# Patient Record
Sex: Male | Born: 1955
Health system: Southern US, Community
[De-identification: ages and names within clinical notes are randomized; demographics above are authoritative.]

## PROBLEM LIST (undated history)

## (undated) DIAGNOSIS — E785 Hyperlipidemia, unspecified: Secondary | ICD-10-CM

## (undated) DIAGNOSIS — C801 Malignant (primary) neoplasm, unspecified: Secondary | ICD-10-CM

## (undated) DIAGNOSIS — H269 Unspecified cataract: Secondary | ICD-10-CM

## (undated) DIAGNOSIS — I10 Essential (primary) hypertension: Secondary | ICD-10-CM

## (undated) HISTORY — DX: Unspecified cataract: H26.9

## (undated) HISTORY — DX: Essential (primary) hypertension: I10

## (undated) HISTORY — DX: Hyperlipidemia, unspecified: E78.5

## (undated) HISTORY — DX: Malignant (primary) neoplasm, unspecified: C80.1

## (undated) HISTORY — PX: APPENDECTOMY: SHX54

---

## 2008-01-05 LAB — HM COLONOSCOPY

## 2012-01-19 ENCOUNTER — Encounter: Payer: Self-pay | Admitting: Family Medicine

## 2012-03-02 ENCOUNTER — Encounter: Payer: Self-pay | Admitting: Family Medicine

## 2012-03-02 ENCOUNTER — Ambulatory Visit (INDEPENDENT_AMBULATORY_CARE_PROVIDER_SITE_OTHER): Payer: Federal, State, Local not specified - PPO | Admitting: Family Medicine

## 2012-03-02 VITALS — BP 182/80 | HR 92 | Temp 98.4°F | Ht 65.5 in | Wt 180.4 lb

## 2012-03-02 DIAGNOSIS — C61 Malignant neoplasm of prostate: Secondary | ICD-10-CM

## 2012-03-02 DIAGNOSIS — I10 Essential (primary) hypertension: Secondary | ICD-10-CM | POA: Insufficient documentation

## 2012-03-02 DIAGNOSIS — E785 Hyperlipidemia, unspecified: Secondary | ICD-10-CM

## 2012-03-02 LAB — BASIC METABOLIC PANEL
BUN: 16 mg/dL (ref 6–23)
CO2: 26 mEq/L (ref 19–32)
Calcium: 9.3 mg/dL (ref 8.4–10.5)
Glucose, Bld: 98 mg/dL (ref 70–99)
Potassium: 4.1 mEq/L (ref 3.5–5.1)
Sodium: 136 mEq/L (ref 135–145)

## 2012-03-02 LAB — CBC WITH DIFFERENTIAL/PLATELET
Basophils Relative: 0.6 % (ref 0.0–3.0)
Eosinophils Relative: 0.9 % (ref 0.0–5.0)
HCT: 39.7 % (ref 39.0–52.0)
Lymphs Abs: 1.1 10*3/uL (ref 0.7–4.0)
MCV: 93.5 fl (ref 78.0–100.0)
Monocytes Relative: 7.8 % (ref 3.0–12.0)
Platelets: 330 10*3/uL (ref 150.0–400.0)
RBC: 4.24 Mil/uL (ref 4.22–5.81)
WBC: 4.9 10*3/uL (ref 4.5–10.5)

## 2012-03-02 LAB — HEPATIC FUNCTION PANEL
ALT: 23 U/L (ref 0–53)
Albumin: 4.2 g/dL (ref 3.5–5.2)
Alkaline Phosphatase: 63 U/L (ref 39–117)
Bilirubin, Direct: 0 mg/dL (ref 0.0–0.3)
Total Protein: 8.3 g/dL (ref 6.0–8.3)

## 2012-03-02 LAB — LDL CHOLESTEROL, DIRECT: Direct LDL: 165.2 mg/dL

## 2012-03-02 MED ORDER — NEBIVOLOL HCL 10 MG PO TABS: 10.0000 mg | ORAL_TABLET | Freq: Every day | ORAL | Status: DC

## 2012-03-02 NOTE — Assessment & Plan Note (Signed)
New to provider.  Pt not currently on meds but was on a statin previously.  No side effects, just stopped taking.  Check labs and restart meds prn.

## 2012-03-02 NOTE — Assessment & Plan Note (Signed)
New to provider.  Following w/ urology.  S/p radiation.  Now on hormonal injxns.

## 2012-03-02 NOTE — Progress Notes (Signed)
  Subjective:    Patient ID: [name], male    DOB: November 06, 1955, 57 y.o.   MRN: [id number]  HPI New to establish.  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  Running 6 miles daily, no CP, SOB, HAs, visual changes.  On Lisinopril 40 and Verapamil 360 daily.  Did not tolerate HCTZ due to sexual dysfxn  Prostate Cancer- dx'd 2 yrs ago, s/p radiation tx.  Following w/ urology (Dr Evelene Croon).  Currently receiving hormone injxns and is on Flomax and Cialis.  Hyperlipidemia- pt reports he has a hx of this but not currently on meds.  Pt recalls total cholesterol ~250.  Was previously on Atorvastatin and Pravastatin.   Review of Systems For ROS see HPI     Objective:   Physical Exam  Vitals reviewed. Constitutional: He is oriented to person, place, and time. He appears well-developed and well-nourished. No distress.  HENT:  Head: Normocephalic and atraumatic.  Eyes: Conjunctivae and EOM are normal. Pupils are equal, round, and reactive to light.  Neck: Normal range of motion. Neck supple. No thyromegaly present.  Cardiovascular: Normal rate, regular rhythm, normal heart sounds and intact distal pulses.   No murmur heard. Pulmonary/Chest: Effort normal and breath sounds normal. No respiratory distress.  Abdominal: Soft. Bowel sounds are normal. He exhibits no distension.  Musculoskeletal: He exhibits no edema.  Lymphadenopathy:    He has no cervical adenopathy.  Neurological: He is alert and oriented to person, place, and time. No cranial nerve deficit.  Skin: Skin is warm and dry.  Psychiatric: He has a normal mood and affect. His behavior is normal.          Assessment & Plan:

## 2012-03-02 NOTE — Patient Instructions (Addendum)
Follow up in 3-4 weeks to recheck BP Start the Bystolic- once daily Continue the Lisinopril and Verapamil We'll notify you of your lab results and make any changes if needed Call with any questions or concerns Welcome!  We're glad to have you!!!

## 2012-03-02 NOTE — Assessment & Plan Note (Signed)
New to provider, ongoing for pt.  Not well controlled.  Asymptomatic.  Add Bystolic 10mg  and follow closely.

## 2012-03-30 ENCOUNTER — Ambulatory Visit (INDEPENDENT_AMBULATORY_CARE_PROVIDER_SITE_OTHER): Payer: Federal, State, Local not specified - PPO | Admitting: Family Medicine

## 2012-03-30 ENCOUNTER — Encounter: Payer: Self-pay | Admitting: Family Medicine

## 2012-03-30 VITALS — BP 168/90 | HR 71 | Temp 98.0°F | Ht 65.5 in | Wt 179.4 lb

## 2012-03-30 DIAGNOSIS — E785 Hyperlipidemia, unspecified: Secondary | ICD-10-CM

## 2012-03-30 DIAGNOSIS — I10 Essential (primary) hypertension: Secondary | ICD-10-CM

## 2012-03-30 MED ORDER — LISINOPRIL 40 MG PO TABS: 40.0000 mg | ORAL_TABLET | Freq: Every day | ORAL | Status: DC

## 2012-03-30 MED ORDER — ATORVASTATIN CALCIUM 10 MG PO TABS: 10.0000 mg | ORAL_TABLET | Freq: Every day | ORAL | Status: DC

## 2012-03-30 MED ORDER — VERAPAMIL HCL ER 360 MG PO CP24: 360.0000 mg | ORAL_CAPSULE | Freq: Every day | ORAL | Status: DC

## 2012-03-30 NOTE — Progress Notes (Signed)
  Subjective:    Patient ID: [name], male    DOB: 02-18-1955, 57 y.o.   MRN: [id number]  HPI HTN- chronic problem, on Verapamil, Lisinopril, Bystolic daily.  Home BPs range 112-147/60-87.  Has hx of white coat HTN.  No CP, SOB, HAs, visual changes, edema.  Hyperlipidemia- chronic problem, has been on Pravastatin before w/ difficulty but did lipitor w/out problem.  Pt not willing to take 20mg - will compromise and take 10mg .   Review of Systems For ROS see HPI     Objective:   Physical Exam  Vitals reviewed. Constitutional: He is oriented to person, place, and time. He appears well-developed and well-nourished. No distress.  HENT:  Head: Normocephalic and atraumatic.  Eyes: Conjunctivae and EOM are normal. Pupils are equal, round, and reactive to light.  Neck: Normal range of motion. Neck supple. No thyromegaly present.  Cardiovascular: Normal rate, regular rhythm, normal heart sounds and intact distal pulses.   No murmur heard. Pulmonary/Chest: Effort normal and breath sounds normal. No respiratory distress.  Abdominal: Soft. Bowel sounds are normal. He exhibits no distension.  Musculoskeletal: He exhibits no edema.  Lymphadenopathy:    He has no cervical adenopathy.  Neurological: He is alert and oriented to person, place, and time. No cranial nerve deficit.  Skin: Skin is warm and dry.  Psychiatric: He has a normal mood and affect. His behavior is normal.          Assessment & Plan:

## 2012-03-30 NOTE — Patient Instructions (Addendum)
Follow up in 3 months to recheck pressure- bring your cuff Start the Lipitor nightly for cholesterol Continue the BP meds Call with any questions or concerns Happy [place]!!!

## 2012-03-30 NOTE — Assessment & Plan Note (Signed)
Chronic problem.  Pt willing to start lipitor 10mg .  Script sent.  Will repeat LFTs at next OV.  Reviewed supportive care and red flags that should prompt return.  Pt expressed understanding and is in agreement w/ plan.

## 2012-03-30 NOTE — Assessment & Plan Note (Signed)
BP not well controlled today but pt has hx of white coat HTN.  Home readings are much better.  Asymptomatic.  Encouraged him to bring cuff to next visit to correlate.  Will continue to follow closely.

## 2012-04-03 ENCOUNTER — Telehealth: Payer: Self-pay | Admitting: Family Medicine

## 2012-04-03 DIAGNOSIS — I10 Essential (primary) hypertension: Secondary | ICD-10-CM

## 2012-04-03 NOTE — Telephone Encounter (Signed)
Patient states he needs refills on bystolic. [facility] [place]

## 2012-04-03 NOTE — Telephone Encounter (Signed)
Pt called again regarding rx request. He states he is completely out and needs this today.

## 2012-04-04 MED ORDER — NEBIVOLOL HCL 10 MG PO TABS: 10.0000 mg | ORAL_TABLET | Freq: Every day | ORAL | Status: DC

## 2012-04-04 NOTE — Telephone Encounter (Signed)
Rx sent to the pharmacy by e-script.//AB/CMA 

## 2012-04-05 ENCOUNTER — Telehealth: Payer: Self-pay | Admitting: *Deleted

## 2012-04-05 ENCOUNTER — Other Ambulatory Visit: Payer: Self-pay | Admitting: *Deleted

## 2012-04-05 NOTE — Telephone Encounter (Signed)
[facility] [place] and gave verbal refill authorization for bystolic.

## 2012-04-05 NOTE — Telephone Encounter (Signed)
[facility] [place], it did not go through via escibe

## 2012-06-30 ENCOUNTER — Encounter: Payer: Self-pay | Admitting: Family Medicine

## 2012-06-30 ENCOUNTER — Ambulatory Visit (INDEPENDENT_AMBULATORY_CARE_PROVIDER_SITE_OTHER): Payer: Federal, State, Local not specified - PPO | Admitting: Family Medicine

## 2012-06-30 VITALS — BP 150/90 | HR 59 | Temp 98.2°F | Ht 65.0 in | Wt 181.8 lb

## 2012-06-30 DIAGNOSIS — I10 Essential (primary) hypertension: Secondary | ICD-10-CM

## 2012-06-30 MED ORDER — TADALAFIL 5 MG PO TABS: ORAL_TABLET | ORAL | Status: DC

## 2012-06-30 MED ORDER — NEBIVOLOL HCL 5 MG PO TABS: 5.0000 mg | ORAL_TABLET | Freq: Every day | ORAL | Status: DC

## 2012-06-30 NOTE — Progress Notes (Signed)
  Subjective:    Patient ID: [name], male    DOB: 03-23-1955, 57 y.o.   MRN: [id number]  HPI HTN- chronic problem, on Lisinopril, Bystolic, verapamil.  Pt reports when he started Bystolic he had severe neck pain, fatigue, SOB.  Having low pulse on home BP cuff.  BP readings range 110-130/50-60s.  Denies CP, HAs, visual changes, edema.   Review of Systems     Objective:   Physical Exam  Vitals reviewed. Constitutional: He is oriented to person, place, and time. He appears well-developed and well-nourished. No distress.  HENT:  Head: Normocephalic and atraumatic.  Eyes: Conjunctivae and EOM are normal. Pupils are equal, round, and reactive to light.  Neck: Normal range of motion. Neck supple. No thyromegaly present.  Cardiovascular: Normal rate, regular rhythm, normal heart sounds and intact distal pulses.   No murmur heard. Pulmonary/Chest: Effort normal and breath sounds normal. No respiratory distress.  Abdominal: Soft. Bowel sounds are normal. He exhibits no distension.  Musculoskeletal: He exhibits no edema.  Lymphadenopathy:    He has no cervical adenopathy.  Neurological: He is alert and oriented to person, place, and time. No cranial nerve deficit.  Skin: Skin is warm and dry.  Psychiatric: He has a normal mood and affect. His behavior is normal.          Assessment & Plan:

## 2012-06-30 NOTE — Patient Instructions (Addendum)
Schedule your complete physical in 6 months Decrease the Bystolic to 5 mg daily Call with any questions or concerns Have a great summer!

## 2012-06-30 NOTE — Assessment & Plan Note (Signed)
Pt's BP excellently controlled at home and other MD offices.  Unclear why BP remains high here.  His cuff is actually higher than our reading so home BPs are likely accurate if not elevated slightly.  Due to fatigue, SOB, and low HR- will decrease Bystolic to 5 mg daily.  Reviewed supportive care and red flags that should prompt return.  Pt expressed understanding and is in agreement w/ plan.

## 2012-08-21 ENCOUNTER — Ambulatory Visit: Payer: Self-pay | Admitting: Urology

## 2012-11-21 ENCOUNTER — Telehealth: Payer: Self-pay

## 2012-11-21 NOTE — Telephone Encounter (Addendum)
Left message for call back Non identifiable Medication List and allergies: reviewed and updated  90 day supply/mail order: na [facility] [address] [place]  Immunizations due: declines flu vaccine  A/P:   No changes to Health Hx,FH or PSH CCS--3-4 years ago in [place] PSA--[place] [facility]-Dr Wolff--07/2012--<0.1 Dexa--08/2012  To Discuss with Provider: Looking forward to seeing you

## 2012-11-22 ENCOUNTER — Encounter: Payer: Self-pay | Admitting: General Practice

## 2012-11-22 ENCOUNTER — Encounter: Payer: Self-pay | Admitting: Family Medicine

## 2012-11-22 ENCOUNTER — Ambulatory Visit (INDEPENDENT_AMBULATORY_CARE_PROVIDER_SITE_OTHER): Payer: Federal, State, Local not specified - PPO | Admitting: Family Medicine

## 2012-11-22 ENCOUNTER — Other Ambulatory Visit: Payer: Self-pay | Admitting: General Practice

## 2012-11-22 VITALS — BP 140/86 | HR 80 | Temp 98.1°F | Resp 16 | Ht 66.5 in | Wt 174.4 lb

## 2012-11-22 DIAGNOSIS — Z0000 Encounter for general adult medical examination without abnormal findings: Secondary | ICD-10-CM

## 2012-11-22 LAB — BASIC METABOLIC PANEL
CO2: 26 mEq/L (ref 19–32)
Calcium: 9.4 mg/dL (ref 8.4–10.5)
Chloride: 102 mEq/L (ref 96–112)
Creatinine, Ser: 0.9 mg/dL (ref 0.4–1.5)
Glucose, Bld: 101 mg/dL — ABNORMAL HIGH (ref 70–99)
Sodium: 136 mEq/L (ref 135–145)

## 2012-11-22 LAB — CBC WITH DIFFERENTIAL/PLATELET
Eosinophils Relative: 1.5 % (ref 0.0–5.0)
HCT: 38.6 % — ABNORMAL LOW (ref 39.0–52.0)
Hemoglobin: 13.3 g/dL (ref 13.0–17.0)
Lymphocytes Relative: 24.4 % (ref 12.0–46.0)
Lymphs Abs: 1.2 10*3/uL (ref 0.7–4.0)
Monocytes Absolute: 0.4 10*3/uL (ref 0.1–1.0)
Monocytes Relative: 7.5 % (ref 3.0–12.0)
Neutro Abs: 3.2 10*3/uL (ref 1.4–7.7)
Neutrophils Relative %: 66.1 % (ref 43.0–77.0)
Platelets: 300 10*3/uL (ref 150.0–400.0)
RBC: 4.17 Mil/uL — ABNORMAL LOW (ref 4.22–5.81)
WBC: 4.9 10*3/uL (ref 4.5–10.5)

## 2012-11-22 LAB — TSH: TSH: 1.24 u[IU]/mL (ref 0.35–5.50)

## 2012-11-22 LAB — LIPID PANEL
LDL Cholesterol: 100 mg/dL — ABNORMAL HIGH (ref 0–99)
Total CHOL/HDL Ratio: 4
Triglycerides: 72 mg/dL (ref 0.0–149.0)

## 2012-11-22 LAB — HEPATIC FUNCTION PANEL
ALT: 22 U/L (ref 0–53)
Albumin: 4.4 g/dL (ref 3.5–5.2)
Alkaline Phosphatase: 56 U/L (ref 39–117)
Bilirubin, Direct: 0 mg/dL (ref 0.0–0.3)
Total Protein: 8.3 g/dL (ref 6.0–8.3)

## 2012-11-22 MED ORDER — ATORVASTATIN CALCIUM 10 MG PO TABS: 10.0000 mg | ORAL_TABLET | Freq: Every day | ORAL | Status: DC

## 2012-11-22 MED ORDER — VERAPAMIL HCL ER 360 MG PO CP24: 360.0000 mg | ORAL_CAPSULE | Freq: Every day | ORAL | Status: DC

## 2012-11-22 MED ORDER — LISINOPRIL 40 MG PO TABS: 40.0000 mg | ORAL_TABLET | Freq: Every day | ORAL | Status: DC

## 2012-11-22 MED ORDER — NEBIVOLOL HCL 5 MG PO TABS: 5.0000 mg | ORAL_TABLET | Freq: Every day | ORAL | Status: DC

## 2012-11-22 MED ORDER — TADALAFIL 5 MG PO TABS: ORAL_TABLET | ORAL | Status: DC

## 2012-11-22 NOTE — Progress Notes (Signed)
  Subjective:    Patient ID: [name], male    DOB: 1955/07/02, 57 y.o.   MRN: [id number]  HPI Pre visit review using our clinic review tool, if applicable. No additional management support is needed unless otherwise documented below in the visit note.   CPE- UTD on colonoscopy, Urology.  No concerns.   Review of Systems Patient reports no vision/hearing changes, anorexia, fever ,adenopathy, persistant/recurrent hoarseness, swallowing issues, chest pain, palpitations, edema, persistant/recurrent cough, hemoptysis, dyspnea (rest,exertional, paroxysmal nocturnal), gastrointestinal  bleeding (melena, rectal bleeding), abdominal pain, excessive heart burn, GU symptoms (dysuria, hematuria, voiding/incontinence issues) syncope, focal weakness, memory loss, numbness & tingling, skin/hair/nail changes, depression, anxiety, abnormal bruising/bleeding, musculoskeletal symptoms/signs.     Objective:   Physical Exam BP 140/86  Pulse 80  Temp(Src) 98.1 F (36.7 C) (Oral)  Resp 16  Ht 5' 6.5" (1.689 m)  Wt 174 lb 6 oz (79.096 kg)  BMI 27.73 kg/m2  SpO2 97%  General Appearance:    Alert, cooperative, no distress, appears stated age  Head:    Normocephalic, without obvious abnormality, atraumatic  Eyes:    PERRL, conjunctiva/corneas clear, EOM's intact, fundi    benign, both eyes       Ears:    Normal TM's and external ear canals, both ears  Nose:   Nares normal, septum midline, mucosa normal, no drainage   or sinus tenderness  Throat:   Lips, mucosa, and tongue normal; teeth and gums normal  Neck:   Supple, symmetrical, trachea midline, no adenopathy;       thyroid:  No enlargement/tenderness/nodules  Back:     Symmetric, no curvature, ROM normal, no CVA tenderness  Lungs:     Clear to auscultation bilaterally, respirations unlabored  Chest wall:    No tenderness or deformity  Heart:    Regular rate and rhythm, S1 and S2 normal, no murmur, rub   or gallop  Abdomen:     Soft, non-tender,  bowel sounds active all four quadrants,    no masses, no organomegaly  Genitalia:    Deferred to urology  Rectal:    Extremities:   Extremities normal, atraumatic, no cyanosis or edema  Pulses:   2+ and symmetric all extremities  Skin:   Skin color, texture, turgor normal, no rashes or lesions  Lymph nodes:   Cervical, supraclavicular, and axillary nodes normal  Neurologic:   CNII-XII intact. Normal strength, sensation and reflexes      throughout          Assessment & Plan:

## 2012-11-22 NOTE — Assessment & Plan Note (Signed)
Pt's PE WNL.  UTD on Urology- deferred GU and rectal exam to them.  UTD on colonoscopy.  Check labs.  Anticipatory guidance provided.

## 2012-11-22 NOTE — Patient Instructions (Signed)
Follow up in 6 months to recheck BP and hyperlipidemia We'll notify you of your lab results and make any changes if needed Keep up the good work!  You look great! Call with any questions or concerns Happy Holidays!

## 2013-01-02 ENCOUNTER — Encounter: Payer: Federal, State, Local not specified - PPO | Admitting: Family Medicine

## 2013-02-09 ENCOUNTER — Other Ambulatory Visit: Payer: Self-pay | Admitting: Family Medicine

## 2013-02-09 NOTE — Telephone Encounter (Signed)
Denied due to pharmacist stating that he had refills on hold.

## 2013-03-12 ENCOUNTER — Other Ambulatory Visit: Payer: Self-pay | Admitting: Family Medicine

## 2013-03-12 NOTE — Telephone Encounter (Signed)
Last OV 11-22-12 Med filled 11-22-12 #30 with 6

## 2013-05-25 ENCOUNTER — Encounter: Payer: Self-pay | Admitting: Family Medicine

## 2013-05-25 ENCOUNTER — Ambulatory Visit (INDEPENDENT_AMBULATORY_CARE_PROVIDER_SITE_OTHER): Payer: Federal, State, Local not specified - PPO | Admitting: Family Medicine

## 2013-05-25 ENCOUNTER — Encounter: Payer: Self-pay | Admitting: General Practice

## 2013-05-25 ENCOUNTER — Other Ambulatory Visit: Payer: Self-pay | Admitting: General Practice

## 2013-05-25 VITALS — BP 142/86 | HR 80 | Temp 98.2°F | Resp 16 | Wt 172.2 lb

## 2013-05-25 DIAGNOSIS — E785 Hyperlipidemia, unspecified: Secondary | ICD-10-CM

## 2013-05-25 DIAGNOSIS — I10 Essential (primary) hypertension: Secondary | ICD-10-CM

## 2013-05-25 LAB — LIPID PANEL
CHOL/HDL RATIO: 3
Cholesterol: 168 mg/dL (ref 0–200)
HDL: 54.7 mg/dL (ref >39.00)
LDL Cholesterol: 103 mg/dL — ABNORMAL HIGH (ref 0–99)
Triglycerides: 54 mg/dL (ref 0.0–149.0)
VLDL: 10.8 mg/dL (ref 0.0–40.0)

## 2013-05-25 LAB — CBC WITH DIFFERENTIAL/PLATELET
BASOS ABS: 0 10*3/uL (ref 0.0–0.1)
Basophils Relative: 0.7 % (ref 0.0–3.0)
EOS ABS: 0.1 10*3/uL (ref 0.0–0.7)
Eosinophils Relative: 1.2 % (ref 0.0–5.0)
HCT: 39.3 % (ref 39.0–52.0)
Hemoglobin: 13.4 g/dL (ref 13.0–17.0)
LYMPHS ABS: 1 10*3/uL (ref 0.7–4.0)
Lymphocytes Relative: 23.5 % (ref 12.0–46.0)
MCHC: 34 g/dL (ref 30.0–36.0)
MCV: 94.6 fl (ref 78.0–100.0)
MONO ABS: 0.3 10*3/uL (ref 0.1–1.0)
MONOS PCT: 7.3 % (ref 3.0–12.0)
NEUTROS ABS: 2.9 10*3/uL (ref 1.4–7.7)
Neutrophils Relative %: 67.3 % (ref 43.0–77.0)
PLATELETS: 281 10*3/uL (ref 150.0–400.0)
RBC: 4.15 Mil/uL — ABNORMAL LOW (ref 4.22–5.81)
RDW: 13.4 % (ref 11.5–15.5)
WBC: 4.4 10*3/uL (ref 4.0–10.5)

## 2013-05-25 LAB — HEPATIC FUNCTION PANEL
ALBUMIN: 4.3 g/dL (ref 3.5–5.2)
ALT: 38 U/L (ref 0–53)
AST: 33 U/L (ref 0–37)
Alkaline Phosphatase: 56 U/L (ref 39–117)
BILIRUBIN DIRECT: 0.1 mg/dL (ref 0.0–0.3)
Total Bilirubin: 0.9 mg/dL (ref 0.2–1.2)
Total Protein: 8.2 g/dL (ref 6.0–8.3)

## 2013-05-25 LAB — BASIC METABOLIC PANEL
BUN: 13 mg/dL (ref 6–23)
CO2: 30 mEq/L (ref 19–32)
Calcium: 9.5 mg/dL (ref 8.4–10.5)
Chloride: 101 mEq/L (ref 96–112)
Creatinine, Ser: 1 mg/dL (ref 0.4–1.5)
GFR: 80.15 mL/min (ref >60.00)
GLUCOSE: 93 mg/dL (ref 70–99)
POTASSIUM: 4.4 meq/L (ref 3.5–5.1)
Sodium: 141 mEq/L (ref 135–145)

## 2013-05-25 MED ORDER — NEBIVOLOL HCL 5 MG PO TABS: 5.0000 mg | ORAL_TABLET | Freq: Every day | ORAL | Status: DC

## 2013-05-25 MED ORDER — LISINOPRIL 40 MG PO TABS: 40.0000 mg | ORAL_TABLET | Freq: Every day | ORAL | Status: DC

## 2013-05-25 MED ORDER — ATORVASTATIN CALCIUM 10 MG PO TABS: 10.0000 mg | ORAL_TABLET | Freq: Every day | ORAL | Status: DC

## 2013-05-25 MED ORDER — VERAPAMIL HCL ER 360 MG PO CP24: 360.0000 mg | ORAL_CAPSULE | Freq: Every day | ORAL | Status: DC

## 2013-05-25 NOTE — Progress Notes (Signed)
   Subjective:    Patient ID: [name], male    DOB: May 19, 1955, 58 y.o.   MRN: [id number]  HPI HTN- chronic problem, on Lisinopril, Bystolic, Verapamil.  Denies CP, SOB, HAs, visual changes, edema.  No CP, SOB, HAs, visual changes, edema.  Hyperlipidemia- chronic problem, on Lipitor.  Denies abd pain, N/V, myalgias   Review of Systems For ROS see HPI     Objective:   Physical Exam  Vitals reviewed. Constitutional: He is oriented to person, place, and time. He appears well-developed and well-nourished. No distress.  HENT:  Head: Normocephalic and atraumatic.  Eyes: Conjunctivae and EOM are normal. Pupils are equal, round, and reactive to light.  Neck: Normal range of motion. Neck supple. No thyromegaly present.  Cardiovascular: Normal rate, regular rhythm, normal heart sounds and intact distal pulses.   No murmur heard. Pulmonary/Chest: Effort normal and breath sounds normal. No respiratory distress.  Abdominal: Soft. Bowel sounds are normal. He exhibits no distension.  Musculoskeletal: He exhibits no edema.  Lymphadenopathy:    He has no cervical adenopathy.  Neurological: He is alert and oriented to person, place, and time. No cranial nerve deficit.  Skin: Skin is warm and dry.  Psychiatric: He has a normal mood and affect. His behavior is normal.          Assessment & Plan:

## 2013-05-25 NOTE — Assessment & Plan Note (Signed)
Pt's BP is always better controlled at home than in office.  Asymptomatic.  Check labs.  No anticipated med changes.

## 2013-05-25 NOTE — Patient Instructions (Signed)
Schedule your complete physical in 6 months We'll notify you of your lab results and make any changes if needed Keep up the good work!  You look great! Call with any questions or concerns [place]!!!

## 2013-05-25 NOTE — Progress Notes (Signed)
Pre visit review using our clinic review tool, if applicable. No additional management support is needed unless otherwise documented below in the visit note. 

## 2013-05-25 NOTE — Assessment & Plan Note (Signed)
Chronic problem.  Tolerating statin w/o difficulty.  Check labs.  Adjust meds prn  

## 2013-05-29 ENCOUNTER — Telehealth: Payer: Self-pay | Admitting: Family Medicine

## 2013-05-29 NOTE — Telephone Encounter (Signed)
Relevant patient education mailed to patient.  

## 2013-08-03 ENCOUNTER — Telehealth: Payer: Self-pay | Admitting: Family Medicine

## 2013-08-03 NOTE — Telephone Encounter (Signed)
Caller name: [place] Relation to pt: Call back number:[phone] Pharmacy:  Reason for call: pt states he needs a PA for Cialis 5mg . He says he has BPH

## 2013-08-06 NOTE — Telephone Encounter (Signed)
PA was approved on 08/06/13. Valid from 06/18/13 to 08/06/2014. Letter faxed to pharmacy and approval note will be sent to office within 48-72 hours.

## 2013-09-02 ENCOUNTER — Other Ambulatory Visit: Payer: Self-pay | Admitting: Family Medicine

## 2013-09-03 NOTE — Telephone Encounter (Signed)
Med filled.  

## 2013-11-23 ENCOUNTER — Ambulatory Visit: Payer: Federal, State, Local not specified - PPO | Admitting: Family Medicine

## 2013-11-27 ENCOUNTER — Ambulatory Visit (INDEPENDENT_AMBULATORY_CARE_PROVIDER_SITE_OTHER): Payer: Federal, State, Local not specified - PPO | Admitting: Family Medicine

## 2013-11-27 ENCOUNTER — Encounter: Payer: Self-pay | Admitting: Family Medicine

## 2013-11-27 VITALS — BP 140/82 | HR 80 | Temp 98.2°F | Resp 16 | Wt 174.0 lb

## 2013-11-27 DIAGNOSIS — E785 Hyperlipidemia, unspecified: Secondary | ICD-10-CM

## 2013-11-27 DIAGNOSIS — I10 Essential (primary) hypertension: Secondary | ICD-10-CM

## 2013-11-27 LAB — HEPATIC FUNCTION PANEL
ALBUMIN: 4.6 g/dL (ref 3.5–5.2)
ALK PHOS: 54 U/L (ref 39–117)
ALT: 22 U/L (ref 0–53)
AST: 23 U/L (ref 0–37)
BILIRUBIN DIRECT: 0 mg/dL (ref 0.0–0.3)
Total Bilirubin: 1 mg/dL (ref 0.2–1.2)
Total Protein: 8.2 g/dL (ref 6.0–8.3)

## 2013-11-27 LAB — BASIC METABOLIC PANEL
BUN: 19 mg/dL (ref 6–23)
CALCIUM: 9.4 mg/dL (ref 8.4–10.5)
CHLORIDE: 101 meq/L (ref 96–112)
CO2: 25 mEq/L (ref 19–32)
Creatinine, Ser: 1 mg/dL (ref 0.4–1.5)
GFR: 83.83 mL/min (ref >60.00)
GLUCOSE: 74 mg/dL (ref 70–99)
Potassium: 4.3 mEq/L (ref 3.5–5.1)
SODIUM: 139 meq/L (ref 135–145)

## 2013-11-27 LAB — LIPID PANEL
CHOLESTEROL: 176 mg/dL (ref 0–200)
HDL: 49.7 mg/dL (ref >39.00)
LDL Cholesterol: 109 mg/dL — ABNORMAL HIGH (ref 0–99)
NONHDL: 126.3
Total CHOL/HDL Ratio: 4
Triglycerides: 87 mg/dL (ref 0.0–149.0)
VLDL: 17.4 mg/dL (ref 0.0–40.0)

## 2013-11-27 LAB — CBC WITH DIFFERENTIAL/PLATELET
BASOS ABS: 0 10*3/uL (ref 0.0–0.1)
Basophils Relative: 0.7 % (ref 0.0–3.0)
EOS ABS: 0 10*3/uL (ref 0.0–0.7)
Eosinophils Relative: 0.6 % (ref 0.0–5.0)
HEMATOCRIT: 39.9 % (ref 39.0–52.0)
Hemoglobin: 13.6 g/dL (ref 13.0–17.0)
Lymphocytes Relative: 15.9 % (ref 12.0–46.0)
Lymphs Abs: 1 10*3/uL (ref 0.7–4.0)
MCHC: 34 g/dL (ref 30.0–36.0)
MCV: 94 fl (ref 78.0–100.0)
MONOS PCT: 5.2 % (ref 3.0–12.0)
Monocytes Absolute: 0.3 10*3/uL (ref 0.1–1.0)
Neutro Abs: 5 10*3/uL (ref 1.4–7.7)
Neutrophils Relative %: 77.6 % — ABNORMAL HIGH (ref 43.0–77.0)
PLATELETS: 274 10*3/uL (ref 150.0–400.0)
RBC: 4.24 Mil/uL (ref 4.22–5.81)
RDW: 13.4 % (ref 11.5–15.5)
WBC: 6.4 10*3/uL (ref 4.0–10.5)

## 2013-11-27 NOTE — Progress Notes (Signed)
   Subjective:    Patient ID: Ansen Sayegh, male    DOB: 05-29-1955, 58 y.o.   MRN: [id number]  HPI HTN- chronic problem, adequate control.  On Lisinopril, Bystolic, Verapamil.  Denies CP, SOB, HAs, visual changes, edema.  Exercising regularly- ran 2 miles this morning.  Hyperlipidemia- chronic problem, on Lipitor.  Denies abd pain, N/V, myalgias.   Review of Systems For ROS see HPI     Objective:   Physical Exam  Constitutional: He is oriented to person, place, and time. He appears well-developed and well-nourished. No distress.  HENT:  Head: Normocephalic and atraumatic.  Eyes: Conjunctivae and EOM are normal. Pupils are equal, round, and reactive to light.  Neck: Normal range of motion. Neck supple. No thyromegaly present.  Cardiovascular: Normal rate, regular rhythm, normal heart sounds and intact distal pulses.   No murmur heard. Pulmonary/Chest: Effort normal and breath sounds normal. No respiratory distress.  Abdominal: Soft. Bowel sounds are normal. He exhibits no distension.  Musculoskeletal: He exhibits no edema.  Lymphadenopathy:    He has no cervical adenopathy.  Neurological: He is alert and oriented to person, place, and time. No cranial nerve deficit.  Skin: Skin is warm and dry.  Psychiatric: He has a normal mood and affect. His behavior is normal.  Vitals reviewed.         Assessment & Plan:

## 2013-11-27 NOTE — Progress Notes (Signed)
Pre visit review using our clinic review tool, if applicable. No additional management support is needed unless otherwise documented below in the visit note. 

## 2013-11-27 NOTE — Assessment & Plan Note (Signed)
Chronic problem.  Tolerating meds w/o difficulty.  Check labs.  Adjust meds prn  

## 2013-11-27 NOTE — Patient Instructions (Signed)
Schedule your complete physical in 6 months We'll notify you of your lab results and make any changes if needed Keep up the good work!  You look great!!! Call with any questions or concerns Happy Holidays!! 

## 2013-11-27 NOTE — Assessment & Plan Note (Signed)
Chronic problem.  Adequate control.  Asymptomatic.  Check labs.  No anticipated med changes 

## 2013-12-06 ENCOUNTER — Telehealth: Payer: Self-pay | Admitting: Family Medicine

## 2013-12-06 NOTE — Telephone Encounter (Signed)
Labs mailed

## 2013-12-06 NOTE — Telephone Encounter (Signed)
Caller name:Stamas Tayton Relation to GQ:HQIX Call back [place]:  Reason for call: pt is requesting a copy of his last lab results mailed to him.

## 2014-03-21 ENCOUNTER — Telehealth: Payer: Self-pay | Admitting: Family Medicine

## 2014-03-21 MED ORDER — TADALAFIL 5 MG PO TABS: ORAL_TABLET | ORAL | Status: DC

## 2014-03-21 MED ORDER — ATORVASTATIN CALCIUM 10 MG PO TABS: 10.0000 mg | ORAL_TABLET | Freq: Every day | ORAL | Status: DC

## 2014-03-21 NOTE — Telephone Encounter (Signed)
Caller name: Elma, Limas Relation to pt: self  Call back number: [phone] [id number] [place], [place] [phone] (Phone) [phone] (Fax)        Reason for call:  Pt requesting a refill atorvastatin (LIPITOR) 10 MG tablet and CIALIS 5 MG tablet

## 2014-03-21 NOTE — Telephone Encounter (Signed)
Med filled.  

## 2014-04-04 ENCOUNTER — Other Ambulatory Visit: Payer: Self-pay | Admitting: General Practice

## 2014-04-04 MED ORDER — TADALAFIL 5 MG PO TABS: ORAL_TABLET | ORAL | Status: DC

## 2014-04-05 ENCOUNTER — Telehealth: Payer: Self-pay | Admitting: Family Medicine

## 2014-04-05 NOTE — Telephone Encounter (Signed)
Or can We use the 11am on June 1st?

## 2014-04-05 NOTE — Telephone Encounter (Signed)
Caller name:Guerette, Jamesen Relation to DD:UKGU Call back [phone] Pharmacy:  Reason for call: pt was scheduled for a CPE on 06/04/14, due to dr. Darene Lamer being off that day pt does not want to wait until July for his CPE, ok to put two slots together?

## 2014-04-05 NOTE — Telephone Encounter (Signed)
Ok to use 11 and move him to next day

## 2014-06-03 ENCOUNTER — Encounter: Payer: Federal, State, Local not specified - PPO | Admitting: Family Medicine

## 2014-06-04 ENCOUNTER — Encounter: Payer: Federal, State, Local not specified - PPO | Admitting: Family Medicine

## 2014-06-24 ENCOUNTER — Other Ambulatory Visit: Payer: Self-pay | Admitting: General Practice

## 2014-06-24 MED ORDER — LISINOPRIL 40 MG PO TABS: 40.0000 mg | ORAL_TABLET | Freq: Every day | ORAL | Status: DC

## 2014-07-22 ENCOUNTER — Other Ambulatory Visit: Payer: Self-pay | Admitting: Family Medicine

## 2014-07-22 ENCOUNTER — Telehealth: Payer: Self-pay | Admitting: Family Medicine

## 2014-07-22 NOTE — Telephone Encounter (Signed)
Med filled.  

## 2014-07-22 NOTE — Telephone Encounter (Signed)
Med filled today

## 2014-07-22 NOTE — Telephone Encounter (Signed)
Relation to pt: self  Call back number: [phone] Pharmacy: [id number] [place], [place] [phone] (Phone) [phone] (Fax)         Reason for call:  Patient requesting a refill nebivolol (BYSTOLIC) 5 MG tablet

## 2014-08-08 ENCOUNTER — Encounter: Payer: Federal, State, Local not specified - PPO | Admitting: Family Medicine

## 2014-08-22 ENCOUNTER — Other Ambulatory Visit: Payer: Self-pay | Admitting: Family Medicine

## 2014-08-22 NOTE — Telephone Encounter (Signed)
°  Relation to LS:LHTD Call back number:[phone] [facility]  Reason for call: pt is needing new rx verapamil (VERELAN PM) 360 MG 24 hr and tadalafil (CIALIS) 5 MG tablet and states pharmacy informed him that he needs PA for the CIALIS.

## 2014-08-23 NOTE — Telephone Encounter (Signed)
Per pt the verapamil is a refill The Cialis needs PA.  Pt out of both meds Please send asap.

## 2014-08-23 NOTE — Telephone Encounter (Signed)
Verapamil was filled, Jess can you work on the Cialis PA?

## 2014-08-26 ENCOUNTER — Telehealth: Payer: Self-pay | Admitting: *Deleted

## 2014-08-26 NOTE — Telephone Encounter (Signed)
PA approved effective 06/27/2014 - 08/26/2015. Approval letter sent for scanning. JG//CMA

## 2014-08-26 NOTE — Telephone Encounter (Signed)
Prior authorization for Cialis initiated. Awaiting determination. JG//CMA

## 2014-10-14 ENCOUNTER — Other Ambulatory Visit: Payer: Self-pay | Admitting: Family Medicine

## 2014-10-14 MED ORDER — NEBIVOLOL HCL 5 MG PO TABS: ORAL_TABLET | ORAL | Status: DC

## 2014-10-14 MED ORDER — ATORVASTATIN CALCIUM 10 MG PO TABS: 10.0000 mg | ORAL_TABLET | Freq: Every day | ORAL | Status: DC

## 2014-10-14 NOTE — Telephone Encounter (Signed)
Rx request to pharmacy/[facility] Requested drug refills are authorized 30-day ONLY, however, the patient needs further evaluation and/or laboratory testing before further refills are given. Ask him to make an appointment for this.

## 2014-10-14 NOTE — Telephone Encounter (Signed)
Caller name: Randee Upchurch   Relationship to patient: Self   Can be reached: [phone]  [facility]acy[id number] [place], [place]  Reason for call: Pt is requesting a refill on his Bystolic & atorvastatin Rx.

## 2014-11-02 ENCOUNTER — Other Ambulatory Visit: Payer: Self-pay | Admitting: Family Medicine

## 2014-11-04 NOTE — Telephone Encounter (Signed)
Medication filled to pharmacy as requested.   

## 2014-11-11 ENCOUNTER — Other Ambulatory Visit: Payer: Self-pay | Admitting: Family Medicine

## 2014-11-11 NOTE — Telephone Encounter (Signed)
Medication filled to pharmacy as requested.   

## 2014-11-12 ENCOUNTER — Telehealth: Payer: Self-pay | Admitting: Family Medicine

## 2014-11-12 ENCOUNTER — Other Ambulatory Visit: Payer: Self-pay | Admitting: Family Medicine

## 2014-11-12 NOTE — Telephone Encounter (Signed)
Caller name: Self   Can be reached: [phone] ([phone]  Pharmacy: [id number] [place], [place] [phone] (Phone) [phone] (Fax)         Reason for call: need refill on CIALIS 5 MG tablet

## 2014-11-12 NOTE — Telephone Encounter (Signed)
Medication filled to pharmacy as requested.   

## 2014-11-20 ENCOUNTER — Ambulatory Visit (INDEPENDENT_AMBULATORY_CARE_PROVIDER_SITE_OTHER): Payer: Federal, State, Local not specified - PPO | Admitting: Family Medicine

## 2014-11-20 ENCOUNTER — Encounter: Payer: Self-pay | Admitting: Family Medicine

## 2014-11-20 VITALS — BP 128/74 | HR 75 | Temp 98.0°F | Resp 16 | Ht 67.0 in | Wt 173.0 lb

## 2014-11-20 DIAGNOSIS — Z23 Encounter for immunization: Secondary | ICD-10-CM | POA: Diagnosis not present

## 2014-11-20 DIAGNOSIS — Z0000 Encounter for general adult medical examination without abnormal findings: Secondary | ICD-10-CM

## 2014-11-20 LAB — TSH: TSH: 1.31 u[IU]/mL (ref 0.35–4.50)

## 2014-11-20 LAB — CBC WITH DIFFERENTIAL/PLATELET
BASOS ABS: 0 10*3/uL (ref 0.0–0.1)
Basophils Relative: 0.5 % (ref 0.0–3.0)
EOS ABS: 0 10*3/uL (ref 0.0–0.7)
Eosinophils Relative: 0.3 % (ref 0.0–5.0)
HCT: 45.2 % (ref 39.0–52.0)
Hemoglobin: 15.2 g/dL (ref 13.0–17.0)
LYMPHS ABS: 1.1 10*3/uL (ref 0.7–4.0)
LYMPHS PCT: 16.2 % (ref 12.0–46.0)
MCHC: 33.7 g/dL (ref 30.0–36.0)
MCV: 95.4 fl (ref 78.0–100.0)
Monocytes Absolute: 0.5 10*3/uL (ref 0.1–1.0)
Monocytes Relative: 7.8 % (ref 3.0–12.0)
NEUTROS ABS: 5.2 10*3/uL (ref 1.4–7.7)
NEUTROS PCT: 75.2 % (ref 43.0–77.0)
PLATELETS: 277 10*3/uL (ref 150.0–400.0)
RBC: 4.74 Mil/uL (ref 4.22–5.81)
RDW: 13.6 % (ref 11.5–15.5)
WBC: 6.9 10*3/uL (ref 4.0–10.5)

## 2014-11-20 LAB — HEPATIC FUNCTION PANEL
ALBUMIN: 4.6 g/dL (ref 3.5–5.2)
ALK PHOS: 54 U/L (ref 39–117)
ALT: 27 U/L (ref 0–53)
AST: 26 U/L (ref 0–37)
BILIRUBIN DIRECT: 0.2 mg/dL (ref 0.0–0.3)
TOTAL PROTEIN: 8.3 g/dL (ref 6.0–8.3)
Total Bilirubin: 0.9 mg/dL (ref 0.2–1.2)

## 2014-11-20 LAB — LIPID PANEL
CHOLESTEROL: 138 mg/dL (ref 0–200)
HDL: 47.3 mg/dL (ref >39.00)
LDL Cholesterol: 77 mg/dL (ref 0–99)
NonHDL: 91.16
TRIGLYCERIDES: 70 mg/dL (ref 0.0–149.0)
Total CHOL/HDL Ratio: 3
VLDL: 14 mg/dL (ref 0.0–40.0)

## 2014-11-20 LAB — BASIC METABOLIC PANEL
BUN: 11 mg/dL (ref 6–23)
CALCIUM: 9.7 mg/dL (ref 8.4–10.5)
CO2: 27 meq/L (ref 19–32)
Chloride: 103 mEq/L (ref 96–112)
Creatinine, Ser: 1.15 mg/dL (ref 0.40–1.50)
GFR: 68.65 mL/min (ref >60.00)
GLUCOSE: 111 mg/dL — AB (ref 70–99)
Potassium: 4.6 mEq/L (ref 3.5–5.1)
SODIUM: 139 meq/L (ref 135–145)

## 2014-11-20 MED ORDER — NEBIVOLOL HCL 5 MG PO TABS: ORAL_TABLET | ORAL | Status: DC

## 2014-11-20 MED ORDER — ATORVASTATIN CALCIUM 10 MG PO TABS
ORAL_TABLET | ORAL | Status: DC
Start: 2014-11-20 — End: 2015-05-19

## 2014-11-20 MED ORDER — TADALAFIL 5 MG PO TABS: ORAL_TABLET | ORAL | Status: DC

## 2014-11-20 MED ORDER — LISINOPRIL 40 MG PO TABS: 40.0000 mg | ORAL_TABLET | Freq: Every day | ORAL | Status: DC

## 2014-11-20 NOTE — Assessment & Plan Note (Signed)
Pt's PE WNL.  Declines flu.  Tdap given.  UTD on urology.  Check labs.  Anticipatory guidance provided.

## 2014-11-20 NOTE — Progress Notes (Signed)
   Subjective:    Patient ID: [name], male    DOB: 1956/01/04, 59 y.o.   MRN: [id number]  HPI CPE- UTD on urology (hx of prostate cancer).  Declines flu shot.   Review of Systems Patient reports no vision/hearing changes, anorexia, fever ,adenopathy, persistant/recurrent hoarseness, swallowing issues, chest pain, palpitations, edema, persistant/recurrent cough, hemoptysis, dyspnea (rest,exertional, paroxysmal nocturnal), gastrointestinal  bleeding (melena, rectal bleeding), abdominal pain, excessive heart burn, GU symptoms (dysuria, hematuria, voiding/incontinence issues) syncope, focal weakness, memory loss, numbness & tingling, skin/hair/nail changes, depression, anxiety, abnormal bruising/bleeding, musculoskeletal symptoms/signs.     Objective:   Physical Exam General Appearance:    Alert, cooperative, no distress, appears stated age  Head:    Normocephalic, without obvious abnormality, atraumatic  Eyes:    PERRL, conjunctiva/corneas clear, EOM's intact, fundi    benign, both eyes       Ears:    Normal TM's and external ear canals, both ears  Nose:   Nares normal, septum midline, mucosa normal, no drainage   or sinus tenderness  Throat:   Lips, mucosa, and tongue normal; teeth and gums normal  Neck:   Supple, symmetrical, trachea midline, no adenopathy;       thyroid:  No enlargement/tenderness/nodules  Back:     Symmetric, no curvature, ROM normal, no CVA tenderness  Lungs:     Clear to auscultation bilaterally, respirations unlabored  Chest wall:    No tenderness or deformity  Heart:    Regular rate and rhythm, S1 and S2 normal, no murmur, rub   or gallop  Abdomen:     Soft, non-tender, bowel sounds active all four quadrants,    no masses, no organomegaly  Genitalia:    Deferred to urology  Rectal:    Extremities:   Extremities normal, atraumatic, no cyanosis or edema  Pulses:   2+ and symmetric all extremities  Skin:   Skin color, texture, turgor normal, no rashes or  lesions  Lymph nodes:   Cervical, supraclavicular, and axillary nodes normal  Neurologic:   CNII-XII intact. Normal strength, sensation and reflexes      throughout          Assessment & Plan:

## 2014-11-20 NOTE — Patient Instructions (Signed)
Follow up as scheduled We'll notify you of your lab results and make any changes Keep up the good work on healthy and diet and regular exercise- you look great! Call with any questions or concerns If you want to join [place] at the new [place] office, any scheduled appointments will automatically transfer and we will see you at [address] [place] [address] [place], [place] [address] (OPENING 01/07/15) Happy Holidays!!!

## 2014-11-20 NOTE — Progress Notes (Signed)
Pre visit review using our clinic review tool, if applicable. No additional management support is needed unless otherwise documented below in the visit note. 

## 2014-11-20 NOTE — Addendum Note (Signed)
Addended by: Davis Gourd on: 11/20/2014 09:39 AM   Modules accepted: Orders

## 2014-11-21 ENCOUNTER — Encounter: Payer: Self-pay | Admitting: General Practice

## 2014-11-21 ENCOUNTER — Other Ambulatory Visit (INDEPENDENT_AMBULATORY_CARE_PROVIDER_SITE_OTHER): Payer: Federal, State, Local not specified - PPO

## 2014-11-21 DIAGNOSIS — R739 Hyperglycemia, unspecified: Secondary | ICD-10-CM

## 2014-11-21 LAB — HEMOGLOBIN A1C: HEMOGLOBIN A1C: 5.2 % (ref 4.6–6.5)

## 2015-02-07 ENCOUNTER — Other Ambulatory Visit: Payer: Self-pay | Admitting: Family Medicine

## 2015-02-07 NOTE — Telephone Encounter (Signed)
Medication filled to pharmacy as requested.   

## 2015-05-05 ENCOUNTER — Other Ambulatory Visit: Payer: Self-pay | Admitting: General Practice

## 2015-05-05 MED ORDER — NEBIVOLOL HCL 5 MG PO TABS: ORAL_TABLET | ORAL | Status: DC

## 2015-05-19 ENCOUNTER — Ambulatory Visit (INDEPENDENT_AMBULATORY_CARE_PROVIDER_SITE_OTHER): Payer: Federal, State, Local not specified - PPO | Admitting: Family Medicine

## 2015-05-19 ENCOUNTER — Encounter: Payer: Self-pay | Admitting: Family Medicine

## 2015-05-19 VITALS — BP 124/80 | HR 72 | Temp 98.1°F | Resp 16 | Ht 67.0 in | Wt 162.1 lb

## 2015-05-19 DIAGNOSIS — E785 Hyperlipidemia, unspecified: Secondary | ICD-10-CM

## 2015-05-19 DIAGNOSIS — I10 Essential (primary) hypertension: Secondary | ICD-10-CM | POA: Diagnosis not present

## 2015-05-19 LAB — HEPATIC FUNCTION PANEL
ALT: 20 U/L (ref 0–53)
AST: 21 U/L (ref 0–37)
Albumin: 4.5 g/dL (ref 3.5–5.2)
Alkaline Phosphatase: 48 U/L (ref 39–117)
BILIRUBIN DIRECT: 0.1 mg/dL (ref 0.0–0.3)
BILIRUBIN TOTAL: 0.7 mg/dL (ref 0.2–1.2)
TOTAL PROTEIN: 7.8 g/dL (ref 6.0–8.3)

## 2015-05-19 LAB — LIPID PANEL
CHOL/HDL RATIO: 3
Cholesterol: 146 mg/dL (ref 0–200)
HDL: 44.4 mg/dL (ref >39.00)
LDL Cholesterol: 88 mg/dL (ref 0–99)
NONHDL: 101.28
TRIGLYCERIDES: 64 mg/dL (ref 0.0–149.0)
VLDL: 12.8 mg/dL (ref 0.0–40.0)

## 2015-05-19 LAB — CBC WITH DIFFERENTIAL/PLATELET
BASOS ABS: 0 10*3/uL (ref 0.0–0.1)
Basophils Relative: 0.4 % (ref 0.0–3.0)
EOS ABS: 0 10*3/uL (ref 0.0–0.7)
EOS PCT: 0.4 % (ref 0.0–5.0)
HCT: 43.8 % (ref 39.0–52.0)
HEMOGLOBIN: 15 g/dL (ref 13.0–17.0)
LYMPHS ABS: 1 10*3/uL (ref 0.7–4.0)
Lymphocytes Relative: 19.6 % (ref 12.0–46.0)
MCHC: 34.1 g/dL (ref 30.0–36.0)
MCV: 96.1 fl (ref 78.0–100.0)
MONO ABS: 0.4 10*3/uL (ref 0.1–1.0)
Monocytes Relative: 7.3 % (ref 3.0–12.0)
NEUTROS PCT: 72.3 % (ref 43.0–77.0)
Neutro Abs: 3.8 10*3/uL (ref 1.4–7.7)
Platelets: 238 10*3/uL (ref 150.0–400.0)
RBC: 4.56 Mil/uL (ref 4.22–5.81)
RDW: 13.9 % (ref 11.5–15.5)
WBC: 5.2 10*3/uL (ref 4.0–10.5)

## 2015-05-19 LAB — BASIC METABOLIC PANEL
BUN: 21 mg/dL (ref 6–23)
CALCIUM: 9.5 mg/dL (ref 8.4–10.5)
CO2: 26 meq/L (ref 19–32)
CREATININE: 1.1 mg/dL (ref 0.40–1.50)
Chloride: 101 mEq/L (ref 96–112)
GFR: 72.15 mL/min (ref >60.00)
Glucose, Bld: 86 mg/dL (ref 70–99)
Potassium: 4.5 mEq/L (ref 3.5–5.1)
Sodium: 135 mEq/L (ref 135–145)

## 2015-05-19 MED ORDER — LISINOPRIL 40 MG PO TABS: 40.0000 mg | ORAL_TABLET | Freq: Every day | ORAL | Status: DC

## 2015-05-19 MED ORDER — ATORVASTATIN CALCIUM 10 MG PO TABS: ORAL_TABLET | ORAL | Status: DC

## 2015-05-19 NOTE — Progress Notes (Signed)
Pre visit review using our clinic review tool, if applicable. No additional management support is needed unless otherwise documented below in the visit note. 

## 2015-05-19 NOTE — Progress Notes (Signed)
   Subjective:    Patient ID: [name], male    DOB: 05/12/1955, 60 y.o.   MRN: [id number]  HPI HTN- chronic problem, on Lisinopril, Bystolic, Verapamil.  Good control.  Pt has lost 11 lbs since last visit.  No CP, SOB, HAs, visual changes, edema.  Hyperlipidemia- chronic problem, on Lipitor daily.  No abd pain, N/V, myalgias.   Review of Systems For ROS see HPI     Objective:   Physical Exam  Constitutional: He is oriented to person, place, and time. He appears well-developed and well-nourished. No distress.  HENT:  Head: Normocephalic and atraumatic.  Eyes: Conjunctivae and EOM are normal. Pupils are equal, round, and reactive to light.  Neck: Normal range of motion. Neck supple. No thyromegaly present.  Cardiovascular: Normal rate, regular rhythm, normal heart sounds and intact distal pulses.   No murmur heard. Pulmonary/Chest: Effort normal and breath sounds normal. No respiratory distress.  Abdominal: Soft. Bowel sounds are normal. He exhibits no distension.  Musculoskeletal: He exhibits no edema.  Lymphadenopathy:    He has no cervical adenopathy.  Neurological: He is alert and oriented to person, place, and time. No cranial nerve deficit.  Skin: Skin is warm and dry.  Psychiatric: He has a normal mood and affect. His behavior is normal.  Vitals reviewed.         Assessment & Plan:

## 2015-05-19 NOTE — Assessment & Plan Note (Signed)
Chronic problem.  Well controlled.  Asymptomatic.  Check labs.  No anticipated med changes. 

## 2015-05-19 NOTE — Patient Instructions (Signed)
Schedule your complete physical in 6 months We'll notify you of your lab results and make any changes if needed Keep up the good work!  You look great! Call with any questions or concerns Thanks for sticking with [place]! Have a great summer!!! [place]!!!

## 2015-05-19 NOTE — Assessment & Plan Note (Signed)
Chronic problem.  Tolerating statin w/o difficulty.  Check labs.  Applauded his weight loss efforts.  Adjust meds prn

## 2015-07-23 DIAGNOSIS — D400 Neoplasm of uncertain behavior of prostate: Secondary | ICD-10-CM | POA: Diagnosis not present

## 2015-07-23 DIAGNOSIS — C61 Malignant neoplasm of prostate: Secondary | ICD-10-CM | POA: Diagnosis not present

## 2015-07-23 DIAGNOSIS — R300 Dysuria: Secondary | ICD-10-CM | POA: Diagnosis not present

## 2015-07-29 DIAGNOSIS — R300 Dysuria: Secondary | ICD-10-CM | POA: Diagnosis not present

## 2015-08-02 ENCOUNTER — Encounter (HOSPITAL_COMMUNITY): Payer: Self-pay

## 2015-08-02 ENCOUNTER — Emergency Department (HOSPITAL_COMMUNITY)
Admission: EM | Admit: 2015-08-02 | Discharge: 2015-08-02 | Disposition: A | Discharge status: Home / Self Care | Payer: Federal, State, Local not specified - PPO | Attending: Emergency Medicine | Admitting: Emergency Medicine

## 2015-08-02 DIAGNOSIS — Z8546 Personal history of malignant neoplasm of prostate: Secondary | ICD-10-CM | POA: Diagnosis not present

## 2015-08-02 DIAGNOSIS — I10 Essential (primary) hypertension: Secondary | ICD-10-CM | POA: Diagnosis not present

## 2015-08-02 DIAGNOSIS — Z87891 Personal history of nicotine dependence: Secondary | ICD-10-CM | POA: Diagnosis not present

## 2015-08-02 DIAGNOSIS — R339 Retention of urine, unspecified: Secondary | ICD-10-CM | POA: Diagnosis not present

## 2015-08-02 LAB — URINALYSIS, ROUTINE W REFLEX MICROSCOPIC
Bilirubin Urine: NEGATIVE
GLUCOSE, UA: NEGATIVE mg/dL
Ketones, ur: NEGATIVE mg/dL
LEUKOCYTES UA: NEGATIVE
Nitrite: NEGATIVE
PH: 5.5 (ref 5.0–8.0)
PROTEIN: NEGATIVE mg/dL
Specific Gravity, Urine: 1.015 (ref 1.005–1.030)

## 2015-08-02 LAB — I-STAT CHEM 8, ED
BUN: 17 mg/dL (ref 6–20)
CALCIUM ION: 1.15 mmol/L (ref 1.12–1.23)
CHLORIDE: 100 mmol/L — AB (ref 101–111)
CREATININE: 0.9 mg/dL (ref 0.61–1.24)
GLUCOSE: 88 mg/dL (ref 65–99)
HCT: 41 % (ref 39.0–52.0)
Hemoglobin: 13.9 g/dL (ref 13.0–17.0)
POTASSIUM: 3.6 mmol/L (ref 3.5–5.1)
Sodium: 137 mmol/L (ref 135–145)
TCO2: 24 mmol/L (ref 0–100)

## 2015-08-02 LAB — URINE MICROSCOPIC-ADD ON

## 2015-08-02 MED ORDER — CEPHALEXIN 500 MG PO CAPS: 500.0000 mg | ORAL_CAPSULE | Freq: Two times a day (BID) | ORAL | 0 refills | Status: DC

## 2015-08-02 NOTE — ED Provider Notes (Signed)
Emergency Department Provider Note   I have reviewed the triage vital signs and the nursing notes.   HISTORY  Chief Complaint Urinary Retention   HPI [name] is a 60 y.o. male with PMH of prostate cancer, HLD, and HTN presents to the emergency department for evaluation of inability to urinate and lower abdominal fullness and discomfort. The patient states he's had some red tinged urine with occasional clot passage over the past 2 weeks which is very unusual for him. He was treated for prostate cancer proximal foot 5 years ago and follows with his urologist every 6 months. He denies problems with hematuria or urinary retention in the past. He was treated for presumed urinary tract infection recently but states that the cultures did not grow out bacteria. Denies any other medication changes. Reports it is no longer taking his tamsulosin at his urologist recommendation. For the past several days he's had difficulty initiating a stream will occasionally passed small clots in the toilet. Today at approximately noon he had only dribbling stream and then nothing.    Past Medical History:  Diagnosis Date  . Cancer ([place])   . Hyperlipidemia   . Hypertension     Patient Active Problem List   Diagnosis Date Noted  . Routine general medical examination at a health care facility 11/22/2012  . HTN (hypertension) 03/02/2012  . Hyperlipidemia 03/02/2012  . Prostate cancer ([place]) 03/02/2012    History reviewed. No pertinent surgical history.  Current Outpatient Rx  . Order #: [id number] Class: Normal  . Order #: [id number] Class: Print  . Order #: [id number] Class: Normal  . Order #: [id number] Class: Normal  . Order #: [id number] Class: Normal  . Order #: [id number] Class: Normal    Allergies Review of patient's allergies indicates no known allergies.  Family History  Problem Relation Age of Onset  . Hypertension Mother   . Hypertension Father   . Hypertension Sister   . Hypertension  Brother     Social History Social History  Substance Use Topics  . Smoking status: Former Smoker    Types: Cigarettes    Quit date: 09/24/2011  . Smokeless tobacco: Never Used  . Alcohol use 10.5 oz/week    21 Standard drinks or equivalent per week    Review of Systems  Constitutional: No fever/chills Eyes: No visual changes. ENT: No sore throat. Cardiovascular: Denies chest pain. Respiratory: Denies shortness of breath. Gastrointestinal: lower abdominal pain.  No nausea, no vomiting.  No diarrhea.  No constipation. Genitourinary: Negative for dysuria. Urinary retention and hematuria.  Musculoskeletal: Negative for back pain. Skin: Negative for rash. Neurological: Negative for headaches, focal weakness or numbness.  10-point ROS otherwise negative.  ____________________________________________   PHYSICAL EXAM:  VITAL SIGNS: ED Triage Vitals  Enc Vitals Group     BP 08/02/15 1604 (!) 217/109     Pulse Rate 08/02/15 1604 94     Resp 08/02/15 1604 18     Temp 08/02/15 1604 98.6 F (37 C)     Temp Source 08/02/15 1604 Oral     SpO2 08/02/15 1604 99 %     Weight 08/02/15 1604 176 lb 2 oz (79.9 kg)     Height 08/02/15 1604 5\' 5"  (1.651 m)     Pain Score 08/02/15 1602 8   Constitutional: Alert and oriented. Well appearing and in no acute distress. Eyes: Conjunctivae are normal.  Head: Atraumatic. Nose: No congestion/rhinnorhea. Mouth/Throat: Mucous membranes are moist.  Oropharynx non-erythematous. Neck: No stridor.   Cardiovascular: Normal rate,  regular rhythm. Good peripheral circulation. Grossly normal heart sounds.   Respiratory: Normal respiratory effort.  No retractions. Lungs CTAB. Gastrointestinal: Soft and nontender. No distention.  Genitourinary: Foley cath in place with pink-tinged urine in line  Musculoskeletal: No lower extremity tenderness nor edema. No gross deformities of extremities. Neurologic:  Normal speech and language. No gross focal neurologic  deficits are appreciated.  Skin:  Skin is warm, dry and intact. No rash noted. Psychiatric: Mood and affect are normal. Speech and behavior are normal.  ____________________________________________   LABS (all labs ordered are listed, but only abnormal results are displayed)  Labs Reviewed  URINALYSIS, ROUTINE W REFLEX MICROSCOPIC (NOT AT [facility]) - Abnormal; Notable for the following:       Result Value   Hgb urine dipstick LARGE (*)    All other components within normal limits  URINE MICROSCOPIC-ADD ON - Abnormal; Notable for the following:    Squamous Epithelial / LPF 0-5 (*)    Bacteria, UA RARE (*)    All other components within normal limits  I-STAT CHEM 8, ED - Abnormal; Notable for the following:    Chloride 100 (*)    All other components within normal limits    ___________________________________   PROCEDURES  Procedure(s) performed:   Procedures  None ____________________________________________   INITIAL IMPRESSION / ASSESSMENT AND PLAN / ED COURSE  Pertinent labs & imaging results that were available during my care of the patient were reviewed by me and considered in my medical decision making (see chart for details).  Patient resents to the emergency department for evaluation of urinary retention and gross hematuria. He had greater than 500 mL of urine on bladder scan and Foley catheter was placed. Patient has great relief after Foley placement. Suspect that the patient may be forming blood clots which is leading to his urinary retention. He also has a history of prostate cancer. Plan for screening labs to evaluate for kidney function and electrolyte abnormalities. We'll likely discharge home with Foley catheter in place and follow-up in the coming week with the patient's established urologist for an office void trial.   05:56 PM Patient with hematuria but no evidence of infection. Creatinine normal. Plan for discharge home with Foley catheter in place. He will  call his urologist on Monday to discuss office follow up. Will place on Keflex while foley in place. Discussed return precautions in detail.  ____________________________________________  FINAL CLINICAL IMPRESSION(S) / ED DIAGNOSES  Final diagnoses:  Urinary retention     MEDICATIONS GIVEN DURING THIS VISIT:  None  NEW OUTPATIENT MEDICATIONS STARTED DURING THIS VISIT:  New Prescriptions   CEPHALEXIN (KEFLEX) 500 MG CAPSULE    Take 1 capsule (500 mg total) by mouth 2 (two) times daily.      Note:  This document was prepared using Dragon voice recognition software and may include unintentional dictation errors.  Nanda Quinton, MD Emergency Medicine    Margette Fast, MD 08/02/15 1800

## 2015-08-02 NOTE — ED Notes (Signed)
Bladder Scanner reads 503

## 2015-08-02 NOTE — ED Triage Notes (Signed)
Patient here with urinary retention since noon. States that he is had minimal amount of urin out and experiencing bladder pain and pressure, has remote hx of prostate Ca. Recently treated for UTI

## 2015-08-02 NOTE — Discharge Instructions (Signed)
You were seen in the ED today with urinary retention. We placed a foley catheter that we are leaving in place. We are starting an antibiotic to prevent infection. Call your Urologist on Monday to schedule a follow up visit to remove the catheter and further investigate the blood in the urine.   Return to the ED with any further urinary retention, fever, chills, abdominal pain, or other concerning symptoms.

## 2015-08-04 ENCOUNTER — Other Ambulatory Visit: Payer: Self-pay | Admitting: Family Medicine

## 2015-08-04 DIAGNOSIS — C61 Malignant neoplasm of prostate: Secondary | ICD-10-CM | POA: Diagnosis not present

## 2015-08-04 DIAGNOSIS — D400 Neoplasm of uncertain behavior of prostate: Secondary | ICD-10-CM | POA: Diagnosis not present

## 2015-08-04 DIAGNOSIS — R310 Gross hematuria: Secondary | ICD-10-CM | POA: Diagnosis not present

## 2015-08-06 DIAGNOSIS — R310 Gross hematuria: Secondary | ICD-10-CM | POA: Diagnosis not present

## 2015-08-11 DIAGNOSIS — C61 Malignant neoplasm of prostate: Secondary | ICD-10-CM | POA: Diagnosis not present

## 2015-08-11 DIAGNOSIS — D400 Neoplasm of uncertain behavior of prostate: Secondary | ICD-10-CM | POA: Diagnosis not present

## 2015-08-11 DIAGNOSIS — R310 Gross hematuria: Secondary | ICD-10-CM | POA: Diagnosis not present

## 2015-08-11 DIAGNOSIS — R339 Retention of urine, unspecified: Secondary | ICD-10-CM | POA: Diagnosis not present

## 2015-08-12 DIAGNOSIS — R310 Gross hematuria: Secondary | ICD-10-CM | POA: Diagnosis not present

## 2015-08-12 DIAGNOSIS — R339 Retention of urine, unspecified: Secondary | ICD-10-CM | POA: Diagnosis not present

## 2015-08-12 DIAGNOSIS — C61 Malignant neoplasm of prostate: Secondary | ICD-10-CM | POA: Diagnosis not present

## 2015-08-12 DIAGNOSIS — D400 Neoplasm of uncertain behavior of prostate: Secondary | ICD-10-CM | POA: Diagnosis not present

## 2015-08-18 DIAGNOSIS — R310 Gross hematuria: Secondary | ICD-10-CM | POA: Diagnosis not present

## 2015-08-18 DIAGNOSIS — N3041 Irradiation cystitis with hematuria: Secondary | ICD-10-CM | POA: Diagnosis not present

## 2015-08-18 DIAGNOSIS — D400 Neoplasm of uncertain behavior of prostate: Secondary | ICD-10-CM | POA: Diagnosis not present

## 2015-08-19 ENCOUNTER — Ambulatory Visit: Payer: Federal, State, Local not specified - PPO | Admitting: Anesthesiology

## 2015-08-19 ENCOUNTER — Encounter
Admission: AD | Disposition: A | Discharge status: Home / Self Care | Payer: Self-pay | Source: Ambulatory Visit | Attending: Urology

## 2015-08-19 ENCOUNTER — Encounter: Payer: Self-pay | Admitting: Anesthesiology

## 2015-08-19 ENCOUNTER — Ambulatory Visit
Admission: AD | Admit: 2015-08-19 | Discharge: 2015-08-19 | Disposition: A | Discharge status: Home / Self Care | Payer: Federal, State, Local not specified - PPO | Source: Ambulatory Visit | Attending: Urology | Admitting: Urology

## 2015-08-19 DIAGNOSIS — N3040 Irradiation cystitis without hematuria: Secondary | ICD-10-CM

## 2015-08-19 DIAGNOSIS — N3041 Irradiation cystitis with hematuria: Secondary | ICD-10-CM | POA: Diagnosis not present

## 2015-08-19 DIAGNOSIS — Z79899 Other long term (current) drug therapy: Secondary | ICD-10-CM | POA: Diagnosis not present

## 2015-08-19 DIAGNOSIS — I10 Essential (primary) hypertension: Secondary | ICD-10-CM | POA: Diagnosis not present

## 2015-08-19 DIAGNOSIS — R338 Other retention of urine: Secondary | ICD-10-CM | POA: Diagnosis not present

## 2015-08-19 DIAGNOSIS — Z87891 Personal history of nicotine dependence: Secondary | ICD-10-CM | POA: Insufficient documentation

## 2015-08-19 DIAGNOSIS — C61 Malignant neoplasm of prostate: Secondary | ICD-10-CM | POA: Insufficient documentation

## 2015-08-19 DIAGNOSIS — R339 Retention of urine, unspecified: Secondary | ICD-10-CM | POA: Diagnosis not present

## 2015-08-19 DIAGNOSIS — R310 Gross hematuria: Secondary | ICD-10-CM | POA: Diagnosis not present

## 2015-08-19 HISTORY — PX: CYSTOSCOPY WITH FULGERATION: SHX6638

## 2015-08-19 SURGERY — CYSTOSCOPY, WITH BLADDER FULGURATION
Anesthesia: General

## 2015-08-19 MED ORDER — LIDOCAINE HCL 2 % EX GEL
CUTANEOUS | Status: AC
  Filled 2015-08-19: qty 10

## 2015-08-19 MED ORDER — BELLADONNA ALKALOIDS-OPIUM 16.2-60 MG RE SUPP
RECTAL | Status: AC
  Filled 2015-08-19: qty 1

## 2015-08-19 MED ORDER — LIDOCAINE HCL (CARDIAC) 20 MG/ML IV SOLN
INTRAVENOUS | Status: DC | PRN
  Administered 2015-08-19: 40 mg via INTRAVENOUS

## 2015-08-19 MED ORDER — LIDOCAINE HCL 2 % EX GEL
CUTANEOUS | Status: DC | PRN
  Administered 2015-08-19: 1 via URETHRAL

## 2015-08-19 MED ORDER — BELLADONNA ALKALOIDS-OPIUM 16.2-60 MG RE SUPP
RECTAL | Status: DC | PRN
  Administered 2015-08-19: 1 via RECTAL

## 2015-08-19 MED ORDER — PROPOFOL 10 MG/ML IV BOLUS
INTRAVENOUS | Status: DC | PRN
  Administered 2015-08-19: 150 mg via INTRAVENOUS
  Administered 2015-08-19: 50 mg via INTRAVENOUS

## 2015-08-19 MED ORDER — GENTAMICIN IN SALINE 1.6-0.9 MG/ML-% IV SOLN
80.0000 mg | Freq: Once | INTRAVENOUS | Status: AC
  Administered 2015-08-19: 80 mg via INTRAVENOUS
  Filled 2015-08-19: qty 50

## 2015-08-19 MED ORDER — FENTANYL CITRATE (PF) 100 MCG/2ML IJ SOLN: 25.0000 ug | INTRAMUSCULAR | Status: DC | PRN

## 2015-08-19 MED ORDER — EPHEDRINE SULFATE 50 MG/ML IJ SOLN
INTRAMUSCULAR | Status: DC | PRN
  Administered 2015-08-19: 10 mg via INTRAVENOUS

## 2015-08-19 MED ORDER — SUGAMMADEX SODIUM 200 MG/2ML IV SOLN
INTRAVENOUS | Status: DC | PRN
  Administered 2015-08-19: 155 mg via INTRAVENOUS

## 2015-08-19 MED ORDER — NUCYNTA 50 MG PO TABS: 50.0000 mg | ORAL_TABLET | Freq: Four times a day (QID) | ORAL | 0 refills | Status: DC | PRN

## 2015-08-19 MED ORDER — SUCCINYLCHOLINE CHLORIDE 20 MG/ML IJ SOLN
INTRAMUSCULAR | Status: DC | PRN
  Administered 2015-08-19: 100 mg via INTRAVENOUS

## 2015-08-19 MED ORDER — PROMETHAZINE HCL 25 MG/ML IJ SOLN: 6.2500 mg | INTRAMUSCULAR | Status: DC | PRN

## 2015-08-19 MED ORDER — ROCURONIUM BROMIDE 100 MG/10ML IV SOLN
INTRAVENOUS | Status: DC | PRN
Start: 2015-08-19 — End: 2015-08-19
  Administered 2015-08-19: 30 mg via INTRAVENOUS
  Administered 2015-08-19: 10 mg via INTRAVENOUS

## 2015-08-19 MED ORDER — DOCUSATE SODIUM 100 MG PO CAPS: 200.0000 mg | ORAL_CAPSULE | Freq: Two times a day (BID) | ORAL | 3 refills | Status: DC

## 2015-08-19 MED ORDER — FENTANYL CITRATE (PF) 100 MCG/2ML IJ SOLN
INTRAMUSCULAR | Status: DC | PRN
  Administered 2015-08-19 (×2): 50 ug via INTRAVENOUS
  Administered 2015-08-19: 100 ug via INTRAVENOUS

## 2015-08-19 MED ORDER — MIDAZOLAM HCL 2 MG/2ML IJ SOLN
INTRAMUSCULAR | Status: DC | PRN
  Administered 2015-08-19: 2 mg via INTRAVENOUS

## 2015-08-19 MED ORDER — TOLTERODINE TARTRATE ER 4 MG PO CP24: 4.0000 mg | ORAL_CAPSULE | Freq: Every day | ORAL | 0 refills | Status: DC

## 2015-08-19 MED ORDER — LACTATED RINGERS IV SOLN
INTRAVENOUS | Status: DC
  Administered 2015-08-19: 1000 mL via INTRAVENOUS
  Administered 2015-08-19: 14:00:00 via INTRAVENOUS

## 2015-08-19 MED ORDER — TAPENTADOL HCL 50 MG PO TABS
50.0000 mg | ORAL_TABLET | Freq: Four times a day (QID) | ORAL | Status: DC | PRN
  Administered 2015-08-19: 50 mg via ORAL
  Filled 2015-08-19: qty 1

## 2015-08-19 MED ORDER — SULFAMETHOXAZOLE-TRIMETHOPRIM 800-160 MG PO TABS: 1.0000 | ORAL_TABLET | Freq: Two times a day (BID) | ORAL | 0 refills | Status: DC

## 2015-08-19 SURGICAL SUPPLY — 25 items
BAG DRAIN CYSTO-URO LG1000N (MISCELLANEOUS) ×2 IMPLANT
BAG URO DRAIN 2000ML W/SPOUT (MISCELLANEOUS) ×2 IMPLANT
CATH FOL 2WAY LX 20X30 (CATHETERS) ×2 IMPLANT
ELECT COAG MONO 22-24F ROLLER (MISCELLANEOUS) ×2
ELECT REM PT RETURN 9FT ADLT (ELECTROSURGICAL) ×2
ELECTRODE COAG MONO 22-24F RLR (MISCELLANEOUS) ×1 IMPLANT
ELECTRODE REM PT RTRN 9FT ADLT (ELECTROSURGICAL) ×1 IMPLANT
GLOVE BIO SURGEON STRL SZ7 (GLOVE) ×4 IMPLANT
GLOVE BIO SURGEON STRL SZ7.5 (GLOVE) ×2 IMPLANT
GOWN STRL REUS W/ TWL LRG LVL3 (GOWN DISPOSABLE) ×1 IMPLANT
GOWN STRL REUS W/ TWL XL LVL3 (GOWN DISPOSABLE) ×1 IMPLANT
GOWN STRL REUS W/TWL LRG LVL3 (GOWN DISPOSABLE) ×1
GOWN STRL REUS W/TWL XL LVL3 (GOWN DISPOSABLE) ×1
KIT RM TURNOVER CYSTO AR (KITS) ×2 IMPLANT
NDL SAFETY 18GX1.5 (NEEDLE) ×2 IMPLANT
PACK CYSTO AR (MISCELLANEOUS) ×2 IMPLANT
PLUG CATH AND CAP STER (CATHETERS) ×2 IMPLANT
PREP PVP WINGED SPONGE (MISCELLANEOUS) ×2 IMPLANT
SET IRRIG Y TYPE TUR BLADDER L (SET/KITS/TRAYS/PACK) ×2 IMPLANT
SOL PREP PVP 2OZ (MISCELLANEOUS) ×2
SOLUTION PREP PVP 2OZ (MISCELLANEOUS) ×1 IMPLANT
SURGILUBE 2OZ TUBE FLIPTOP (MISCELLANEOUS) ×2 IMPLANT
SYRINGE IRR TOOMEY STRL 70CC (SYRINGE) ×2 IMPLANT
WATER STERILE IRR 1000ML POUR (IV SOLUTION) ×2 IMPLANT
WATER STERILE IRR 3000ML UROMA (IV SOLUTION) ×2 IMPLANT

## 2015-08-19 NOTE — H&P (Signed)
Date of Initial H&P: 08/19/15  History reviewed, patient examined, no change in status, stable for surgery.

## 2015-08-19 NOTE — Anesthesia Postprocedure Evaluation (Signed)
Anesthesia Post Note  Patient: [name]  Procedure(s) Performed: Procedure(s) (LRB): [place] / CLOT EVACUATION (N/A)  Patient location during evaluation: PACU Anesthesia Type: General Level of consciousness: awake and alert Pain management: pain level controlled Vital Signs Assessment: post-procedure vital signs reviewed and stable Respiratory status: spontaneous breathing, nonlabored ventilation, respiratory function stable and patient connected to nasal cannula oxygen Cardiovascular status: blood pressure returned to baseline and stable Postop Assessment: no signs of nausea or vomiting Anesthetic complications: no    Last Vitals:  Vitals:   08/19/15 1708 08/19/15 1712  BP:  (!) 195/97  Pulse: 84   Resp: 16   Temp:      Last Pain:  Vitals:   08/19/15 1708  TempSrc:   PainSc: 2                  Martha Clan

## 2015-08-19 NOTE — Discharge Instructions (Addendum)
Hematuria, Adult Hematuria is blood in your urine. It can be caused by a bladder infection, kidney infection, prostate infection, kidney stone, or cancer of your urinary tract. Infections can usually be treated with medicine, and a kidney stone usually will pass through your urine. If neither of these is the cause of your hematuria, further workup to find out the reason may be needed. It is very important that you tell your health care provider about any blood you see in your urine, even if the blood stops without treatment or happens without causing pain. Blood in your urine that happens and then stops and then happens again can be a symptom of a very serious condition. Also, pain is not a symptom in the initial stages of many urinary cancers. HOME CARE INSTRUCTIONS   Drink lots of fluid, 3-4 quarts a day. If you have been diagnosed with an infection, cranberry juice is especially recommended, in addition to large amounts of water.  Avoid caffeine, tea, and carbonated beverages because they tend to irritate the bladder.  Avoid alcohol because it may irritate the prostate.  Take all medicines as directed by your health care provider.  If you were prescribed an antibiotic medicine, finish it all even if you start to feel better.  If you have been diagnosed with a kidney stone, follow your health care provider's instructions regarding straining your urine to catch the stone.  Empty your bladder often. Avoid holding urine for long periods of time.  After a bowel movement, women should cleanse front to back. Use each tissue only once.  Empty your bladder before and after sexual intercourse if you are a male. SEEK MEDICAL CARE IF:  You develop back pain.  You have a fever.  You have a feeling of sickness in your stomach (nausea) or vomiting.  Your symptoms are not better in 3 days. Return sooner if you are getting worse. SEEK IMMEDIATE MEDICAL CARE IF:   You develop severe vomiting and  are unable to keep the medicine down.  You develop severe back or abdominal pain despite taking your medicines.  You begin passing a large amount of blood or clots in your urine.  You feel extremely weak or faint, or you pass out. MAKE SURE YOU:   Understand these instructions.  Will watch your condition.  Will get help right away if you are not doing well or get worse.   This information is not intended to replace advice given to you by your health care provider. Make sure you discuss any questions you have with your health care provider.   Document Released: 12/21/2004 Document Revised: 01/11/2014 Document Reviewed: 08/21/2012 Elsevier Interactive Patient Education 2016 Elsevier Inc[employer]   Disposition: Prostate Cancer  Prostate cancer is the abnormal growth of cells in your prostate gland. Your prostate gland is involved in the production of semen. It is located below your bladder and in front of your rectum. A normal prostate gland is the size of a walnut and surrounds the tube that carries urine from the bladder (urethra).  RISK FACTORS  Age older than 44 years.  African American race.  Obesity.  Family history of prostate cancer.  Family history of breast cancer. SIGNS AND SYMPTOMS   Frequent urination.  Weak or interrupted flow of urine.  Difficulty starting or stopping urination.  Inability to urinate.  Painful or burning urination.  Painful ejaculation.  Blood in urine or semen.  Persistent pain or discomfort in the lower back, lower abdomen, hips, or  upper thighs.  Difficulty getting an erection.  Difficulty emptying your bladder completely. DIAGNOSIS  Prostate cancer can be diagnosed by a digital rectal exam, prostate-specific antigen (PSA) blood test, transrectal ultrasonography, and then a biopsy to test a tissue sample. Usually 8-12 samples are taken. The tissue is sent to a specialist who looks at tissues and cells (pathologist). If cancer is  diagnosed, the next step is to stage the cancer. This means it is put in a category based on how far the cancer has spread. This is important for helping your health care providers plan appropriate treatment. The different stages of prostate cancer are as follows:  Stage I. Cancer is found in the prostate only. It cannot be felt during a digital rectal exam and is not visible by imaging. It is usually found accidentally, such as during surgery for other prostate problems.  Stage II. Cancer is more advanced than in stage I but has not spread outside the prostate.  Stage III. Cancer has spread beyond the outer layer of the prostate to nearby tissues. Cancer may be found in the seminal vesicles.  Stage IV. Cancer has spread to lymph nodes or to other parts of the body. The cancer may have spread to the bladder, rectum, bones, liver, or lungs. Prostate cancer often spreads to the bones. Imaging scans, such as a bone scan, CT, PET, or MRI, are done to help in the staging process. TREATMENT  Treatments such as surgery, medicines, and radiation may be recommended based on the stage of the cancer and other factors. Once cancer of the prostate has been diagnosed, your health care provider will discuss your treatment with you. Your health care provider will help you decide on the best course of treatment. Treatment often depends on your age, health, and other risk factors. The more common methods of treatment are:  Observation for early stage prostate cancer.  Open surgery. This involves a surgery to remove the prostate.  Laparoscopic prostatectomy to remove the prostate and lymph nodes.  Robotic prostatectomy to remove the prostate and lymph nodes.  External beam radiation, which aims beams of radiation from outside the body at the prostate.  Internal radiation (brachytherapy), which uses radioactive needles, 40-100 pellets (seeds), wires, or catheters implanted directly into the prostate  gland.  High-intensity, focused ultrasonography to destroy cancer cells.  Cryosurgery to freeze and destroy prostate cancer cells.  Chemotherapy medicines to stop the growth of cancer cells either by killing them or by stopping them from multiplying.  Hormone treatment. Medicines that stop your body from producing testosterone, or medicines that block testosterone from reaching cancer cells.  Orchiectomy. This is surgery to remove your testicles. HOME CARE INSTRUCTIONS  Take medicines only as directed by your health care provider.  Maintain a healthy diet.  Get plenty of sleep.  Consider joining a support group. This may help you learn to cope with the stress of having prostate cancer.  Seek advice to help you manage treatment side effects.  Keep all follow-up visits as directed by your health care provider.  Inform your cancer specialist if you are admitted to the hospital.  Continue sexual expression. Consider touching, holding, hugging, and caressing as ways to continue sharing sexuality with your partner. SEEK MEDICAL CARE IF:  You have trouble urinating.  You have blood in your urine.  You have trouble getting an erection.  You have pain in your hips, back, or chest. SEEK IMMEDIATE MEDICAL CARE IF:  You have weakness or numbness in  your legs.  You have involuntary loss of urine or stool (incontinence).   This information is not intended to replace advice given to you by your health care provider. Make sure you discuss any questions you have with your health care provider.   Document Released: 12/21/2004 Document Revised: 09/11/2014 Document Reviewed: 06/09/2012 Elsevier Interactive Patient Education 2016 [place]. External Beam Radiation Therapy, Care After Refer to this sheet in the next few weeks. These instructions provide you with information on caring for yourself after your treatments. Your health care provider may also give you more specific  instructions. Your treatment has been planned according to current medical practices, but problems sometimes occur. Call your health care provider if you have any problems or questions after your treatments. WHAT TO EXPECT AFTER THE PROCEDURE After external beam radiation therapy, it is typical to have the following:  Fatigue.  Red, flaking skin in the affected area.  Hair loss in the affected area.  Itching in the affected area. Depending upon which part of your body is exposed to radiation and how much radiation is used, other side effects may occur. For example:  Hair loss may occur if the radiation therapy is directed to your head.  Coughing or difficulty swallowing may occur if the radiation therapy is directed to your head, neck, or chest.  Nausea, vomiting, or diarrhea may occur if the radiation therapy is directed to your abdomen or pelvis.  Bladder problems, frequent urination, or sexual dysfunction may occur if the radiation therapy is directed to your bladder, kidney, or prostate. Most side effects are usually temporary and lessen over time. It can take up to 3-4 weeks for you to regain your energy or for side effects to lessen.  HOME CARE INSTRUCTIONS  Take medicines only as directed by your health care provider.  Do not use a heating pad or a warm cloth to relieve pain in your affected area.  Wash your skin with a mild soap as directed by your health care provider. Do not scrub or rub your skin. Pat yourself dry.  Apply lotion or cream to your affected area as directed by your health care provider. Sunscreen does not protect you from the radiation burn and is not recommended.  Keep your affected area covered when outdoors.  Avoid scratching your affected area.  Follow your health care provider's advice for the type and amount of liquids to drink each day.  Maintain your weight during treatment.  Schedule and attend follow-up visits as directed by your health care  provider. These are usually scheduled 6 weeks to 6 months after radiation therapy. They are needed to determine if the therapy helped reduce your pain or eliminate your cancer cells. It is important to keep all your appointments. SEEK MEDICAL CARE IF:  You have a fever.  You have increased pain in your affected area.  The redness worsens in your affected area.  Your affected skin develops open areas or blisters.  You have prolonged nausea or vomiting.  You have prolonged diarrhea.  You have an unexplained weight loss.   This information is not intended to replace advice given to you by your health care provider. Make sure you discuss any questions you have with your health care provider.   Document Released: 12/26/2012 Document Revised: 01/11/2014 Document Reviewed: 12/26/2012 Elsevier Interactive Patient Education 2016 [place]   1) The drugs that you were given will stay in your system until tomorrow so  for the next 24 hours you should not:  A) Drive an automobile B) Make any legal decisions C) Drink any alcoholic beverage   2) You may resume regular meals tomorrow.  Today it is better to start with liquids and gradually work up to solid foods.  You may eat anything you prefer, but it is better to start with liquids, then soup and crackers, and gradually work up to solid foods.   3) Please notify your doctor immediately if you have any unusual bleeding, trouble breathing, redness and pain at the surgery site, drainage, fever, or pain not relieved by medication.    4) Additional Instructions:        Please contact your physician with any problems or Same Day Surgery at [phone], Monday through Friday 6 am to 4 pm, or [place] at [facility] number at [phone].

## 2015-08-19 NOTE — Transfer of Care (Signed)
Immediate Anesthesia Transfer of Care Note  Patient: [name]  Procedure(s) Performed: Procedure(s): [place] / [place] (N/A)  Patient Location: PACU  Anesthesia Type:General  Level of Consciousness: awake, alert  and oriented  Airway & Oxygen Therapy: Patient Spontanous Breathing and Patient connected to face mask oxygen  Post-op Assessment: Report given to RN and Post -op Vital signs reviewed and stable  Post vital signs: stable  Last Vitals:  Vitals:   08/19/15 1258 08/19/15 1542  BP: (!) 188/102 (!) 162/86  Pulse: 86 82  Resp: 18 12  Temp: 37.2 C 36.8 C    Last Pain:  Vitals:   08/19/15 1542  TempSrc: Temporal         Complications: No apparent anesthesia complications

## 2015-08-19 NOTE — H&P (Signed)
NA[place]ON[place]ORD, BARSE NO.:  [id number]  MEDICAL RECORD NO.:  [id number]  LOCATION:                                 FACILITY:  PHYSICIAN:  [name]          DATE OF BIRTH:  1955/04/08  DATE OF ADMISSION:  08/19/2015 DATE OF DISCHARGE:                            HISTORY AND PHYSICAL   Same-day surgery, August 19, 2015.  CHIEF COMPLAINT:  Urinary retention and gross hematuria secondary to radiation cystitis.  HISTORY OF PRESENT ILLNESS:  [name] is a 60 year old, Hispanic male, who developed acute urinary retention due to clots in July 2017. He had a Foley catheter placed in the emergency room.  Evaluation in the office included upper tract IVP evaluation, which revealed normal upper tracts.  Cystoscopy on August 18, 2015, indicated radiation cystitis, findings of the bladder.  The patient has had persistent bleeding and clots since the initial episode and comes in for cystoscopy with fulguration and clot evacuation.  The patient was diagnosed initially with prostate cancer on June 08, 2015.  He had a Gleason's grade 3+4 adenocarcinoma involving 4 of 12 biopsies on the right side. Pretreatment PSA was 7.5 ng/mL.  He completed external beam radiation therapy in November 2013 and brachytherapy in December 2013.  He also had 1 year of adjuvant androgen blockade.  PAST MEDICAL HISTORY:  No drug allergies.  CURRENT MEDICATIONS:  Lisinopril and multivitamins.  PREVIOUS SURGICAL PROCEDURES:  Include bilateral vasectomy in 21.  SOCIAL HISTORY:  The patient quit smoking in 2011 with a 30-pack year history.  He drinks approximately 12-24 beers per week.  FAMILY HISTORY:  Positive for parents with hypertension and hypercholesterolemia.  PAST AND CURRENT MEDICAL CONDITIONS:  Hypertension.  REVIEW OF SYSTEMS:  The patient chest pain, shortness of breath, diabetes, or stroke.  PHYSICAL EXAMINATION:  GENERAL:  Well-nourished male in no distress. HEENT:   Sclerae were clear.  Pupils are equally round, react to light and accommodation.  Extraocular movements were intact. NECK:  Supple.  No palpable cervical adenopathy. LUNGS:  Clear to auscultation. CARDIOVASCULAR:  Regular rhythm and rate without audible murmurs. ABDOMEN:  Soft, nontender abdomen. GU:  Uncircumcised testes, smooth, nontender. RECTAL:  A 20 g flap, smooth, nontender prostate. NEUROMUSCULAR:  Grossly intact.  IMPRESSION:  Clot urinary retention secondary to radiation cystitis.  PLAN:  Cystoscopy with fulguration.    ______________________________ [name]   ______________________________ [name]    MW/MEDQ  D:  08/19/2015  T:  08/19/2015  Job:  [id number]

## 2015-08-19 NOTE — Anesthesia Procedure Notes (Signed)
Procedure Name: Intubation Date/Time: 08/19/2015 2:30 PM Performed by: Allean Found Pre-anesthesia Checklist: Patient identified, Emergency Drugs available, Suction available, Patient being monitored and Timeout performed Patient Re-evaluated:Patient Re-evaluated prior to inductionOxygen Delivery Method: Circle system utilized Preoxygenation: Pre-oxygenation with 100% oxygen Intubation Type: IV induction Ventilation: Mask ventilation without difficulty Laryngoscope Size: Mac and 3 Grade View: Grade I Tube type: Oral Tube size: 7.5 mm Number of attempts: 1 Secured at: 22 cm Tube secured with: Tape Dental Injury: Teeth and Oropharynx as per pre-operative assessment

## 2015-08-19 NOTE — Anesthesia Preprocedure Evaluation (Addendum)
Anesthesia Evaluation  Patient identified by MRN, date of birth, ID band Patient awake    Reviewed: Allergy & Precautions, NPO status , Patient's Chart, lab work & pertinent test results, reviewed documented beta blocker date and time   Airway Mallampati: II       Dental  (+) Upper Dentures, Lower Dentures   Pulmonary neg pulmonary ROS, former smoker,    Pulmonary exam normal        Cardiovascular Pt. on medications and Pt. on home beta blockers Normal cardiovascular exam     Neuro/Psych negative neurological ROS  negative psych ROS   GI/Hepatic negative GI ROS, Neg liver ROS,   Endo/Other  negative endocrine ROS  Renal/GU negative Renal ROS  negative genitourinary   Musculoskeletal negative musculoskeletal ROS (+)   Abdominal Normal abdominal exam  (+)   Peds negative pediatric ROS (+)  Hematology negative hematology ROS (+)   Anesthesia Other Findings   Reproductive/Obstetrics                            Anesthesia Physical Anesthesia Plan  ASA: II  Anesthesia Plan: General   Post-op Pain Management:    Induction: Intravenous  Airway Management Planned: Oral ETT  Additional Equipment:   Intra-op Plan:   Post-operative Plan: Extubation in OR  Informed Consent: I have reviewed the patients History and Physical, chart, labs and discussed the procedure including the risks, benefits and alternatives for the proposed anesthesia with the patient or authorized representative who has indicated his/her understanding and acceptance.   Dental advisory given  Plan Discussed with: CRNA and Surgeon  Anesthesia Plan Comments:         Anesthesia Quick Evaluation

## 2015-08-19 NOTE — Op Note (Signed)
Preoperative diagnosis: 1. Hemorrhagic radiation cystitis                                             2. Clot urinary retention                                            3. Prostate cancer  Postoperative diagnosis: Same  Procedure: 1. Bladder fulguration                      2. Clot evacuation  Surgeon: Otelia Limes. Yves Dill MD  Anesthesia: General   Indications:See the history and physical. After informed consent the above procedure(s) were requested     Technique and findings: After adequate general anesthesia had been obtained the patient was placed into dorsal thigh position and the perineum was prepped and draped in the usual fashion. The resectoscope sheath was advanced into the bladder with the obturator in place. The resectoscope was coupled the camera and then placed into the sheath. The bladder was thoroughly inspected. Numerous clots were present which were evacuated with the [facility] syringe. Patient had numerous erythematous patches of the anterior and posterior bladder walls with areas of active hemorrhage. These areas were fulgurated with the rollerball electrode. At this point the resectoscope was removed and 10 cc of viscous Xylocaine instilled within the bladder and urethra. A 20 French Foley catheter was placed. A catheter was irrigated until clear. A B&O suppository placed and procedure terminated terminated. Patient was then transferred to the recovery room in stable condition.

## 2015-08-20 ENCOUNTER — Encounter: Payer: Self-pay | Admitting: Urology

## 2015-08-26 ENCOUNTER — Encounter: Payer: Federal, State, Local not specified - PPO | Attending: Internal Medicine | Admitting: Internal Medicine

## 2015-08-26 DIAGNOSIS — R338 Other retention of urine: Secondary | ICD-10-CM | POA: Diagnosis not present

## 2015-08-26 DIAGNOSIS — D400 Neoplasm of uncertain behavior of prostate: Secondary | ICD-10-CM | POA: Diagnosis not present

## 2015-08-26 DIAGNOSIS — L598 Other specified disorders of the skin and subcutaneous tissue related to radiation: Secondary | ICD-10-CM | POA: Insufficient documentation

## 2015-08-26 DIAGNOSIS — R310 Gross hematuria: Secondary | ICD-10-CM | POA: Diagnosis not present

## 2015-08-26 DIAGNOSIS — I10 Essential (primary) hypertension: Secondary | ICD-10-CM | POA: Diagnosis not present

## 2015-08-26 DIAGNOSIS — C61 Malignant neoplasm of prostate: Secondary | ICD-10-CM | POA: Diagnosis not present

## 2015-08-26 DIAGNOSIS — Z8249 Family history of ischemic heart disease and other diseases of the circulatory system: Secondary | ICD-10-CM | POA: Insufficient documentation

## 2015-08-26 DIAGNOSIS — Z8546 Personal history of malignant neoplasm of prostate: Secondary | ICD-10-CM | POA: Diagnosis not present

## 2015-08-26 DIAGNOSIS — N3041 Irradiation cystitis with hematuria: Secondary | ICD-10-CM | POA: Insufficient documentation

## 2015-08-26 NOTE — Progress Notes (Signed)
[name], [name] ([id number]) Visit Report for 08/26/2015 Abuse/Suicide Risk Screen Details Patient Name: [name], [name] Date of Service: 08/26/2015 1:30 PM Medical Record Patient Account Number: [id number] Number: Treating RN: Baruch Gouty, RN, BSN, Rita 1955/08/24 [phone] y.o. Other Clinician: Date of Birth/Sex: Male) Treating ROBSON, [name] Primary Care Physician: Annye Asa Physician/Extender: G Referring Physician: Lily Peer in Treatment: 0 Abuse/Suicide Risk Screen Items Answer ABUSE/SUICIDE RISK SCREEN: Has anyone close to you tried to hurt or harm you recentlyo No Do you feel uncomfortable with anyone in your familyo No Has anyone forced you do things that you didnot want to doo No Do you have any thoughts of harming yourselfo No Patient displays signs or symptoms of abuse and/or neglect. No Electronic Signature(s) Signed: 08/26/2015 4:21:51 PM By: Regan Lemming BSN, RN Entered By: Regan Lemming on 08/26/2015 13:37:18 [name], [name] ([id number]) -------------------------------------------------------------------------------- Activities of Daily Living Details Patient Name: [name] Date of Service: 08/26/2015 1:30 PM Medical Record Patient Account Number: [id number] Number: Treating RN: Afful, RN, BSN, Rita 1955-08-24 [phone] y.o. Other Clinician: Date of Birth/Sex: Male) Treating ROBSON, [place] Primary Care Physician: Annye Asa Physician/Extender: G Referring Physician: Lily Peer in Treatment: 0 Activities of Daily Living Items Answer Activities of Daily Living (Please select one for each item) Drive Automobile Completely Able Take Medications Completely Able Use Telephone Completely Able Care for Appearance Completely Able Use Toilet Completely Able Bath / Shower Completely Able Dress Self Completely Able Feed Self Completely Able Walk Completely Able Get In / Out Bed Completely Able Housework Completely  Able Prepare Meals Completely [place] for Self Completely Able Electronic Signature(s) Signed: 08/26/2015 4:21:51 PM By: Regan Lemming BSN, RN Entered By: Regan Lemming on 08/26/2015 13:37:44 [name], [name] ([id number]) -------------------------------------------------------------------------------- Education Assessment Details Patient Name: [name] Date of Service: 08/26/2015 1:30 PM Medical Record Patient Account Number: [id number] Number: Treating RN: Baruch Gouty, RN, BSN, Rita Aug 19, 1955 [phone] y.o. Other Clinician: Date of Birth/Sex: Male) Treating ROBSON, [name] Primary Care Physician: Annye Asa Physician/Extender: G Referring Physician: Lily Peer in Treatment: 0 Primary Learner Assessed: Patient Learning Preferences/Education Level/Primary Language Learning Preference: Explanation Highest Education Level: High School Preferred Language: English Cognitive Barrier Assessment/Beliefs Language Barrier: No Physical Barrier Assessment Impaired Vision: No Impaired Hearing: No Knowledge/Comprehension Assessment Knowledge Level: High Comprehension Level: High Ability to understand written High instructions: Ability to understand verbal High instructions: Motivation Assessment Anxiety Level: Calm Cooperation: Cooperative Education Importance: Acknowledges Need Interest in Health Problems: Asks Questions Perception: Coherent Willingness to Engage in Self- High Management Activities: Readiness to Engage in Self- High Management Activities: Electronic Signature(s) Signed: 08/26/2015 4:21:51 PM By: Regan Lemming BSN, RN Entered By: Regan Lemming on 08/26/2015 13:38:07 [name], [name] ([id number]) -------------------------------------------------------------------------------- Fall Risk Assessment Details Patient Name: [name] Date of Service: 08/26/2015 1:30 PM Medical Record Patient Account Number:  [id number] Number: Treating RN: Baruch Gouty, RN, BSN, Rita 06-19-1955 [phone] y.o. Other Clinician: Date of Birth/Sex: Male) Treating ROBSON, [name] Primary Care Physician: Annye Asa Physician/Extender: G Referring Physician: Lily Peer in Treatment: 0 Fall Risk Assessment Items Have you had 2 or more falls in the last 12 monthso 0 No Have you had any fall that resulted in injury in the last 12 monthso 0 No FALL RISK ASSESSMENT: History of falling - immediate or within 3 months 0 No Secondary diagnosis 0 No Ambulatory aid None/bed rest/wheelchair/nurse 0 Yes Crutches/cane/walker 0 No Furniture 0 No IV Access/Saline Lock 0 No Gait/Training Normal/bed rest/immobile 0 Yes Weak 0 No  Impaired 0 No Mental Status Oriented to own ability 0 Yes Electronic Signature(s) Signed: 08/26/2015 4:21:51 PM By: Regan Lemming BSN, RN Entered By: Regan Lemming on 08/26/2015 13:38:31 [name], [name] ([id number]) -------------------------------------------------------------------------------- Foot Assessment Details Patient Name: [name] Date of Service: 08/26/2015 1:30 PM Medical Record Patient Account Number: [id number] Number: Treating RN: Baruch Gouty, RN, BSN, Rita July 03, 1955 [phone] y.o. Other Clinician: Date of Birth/Sex: Male) Treating ROBSON, [name] Primary Care Physician: Annye Asa Physician/Extender: G Referring Physician: Lily Peer in Treatment: 0 Foot Assessment Items Site Locations + = Sensation present, - = Sensation absent, C = Callus, U = Ulcer R = Redness, W = Warmth, M = Maceration, PU = Pre-ulcerative lesion F = Fissure, S = Swelling, D = Dryness Assessment Right: Left: Other Deformity: No No Prior Foot Ulcer: No No Prior Amputation: No No Charcot Joint: No No Ambulatory Status: Ambulatory Without Help Gait: Steady Electronic Signature(s) Signed: 08/26/2015 4:21:51 PM By: Regan Lemming BSN, RN Entered By: Regan Lemming on  08/26/2015 13:39:04 [name], [name] ([id number]) -------------------------------------------------------------------------------- Nutrition Risk Assessment Details Patient Name: [name] Date of Service: 08/26/2015 1:30 PM Medical Record Patient Account Number: [id number] Number: Treating RN: Baruch Gouty, RN, BSN, Rita April 03, 1955 [phone] y.o. Other Clinician: Date of Birth/Sex: Male) Treating ROBSON, [place] Primary Care Physician: Annye Asa Physician/Extender: G Referring Physician: Maryan Puls Weeks in Treatment: 0 Height (in): 65 Weight (lbs): 162 Body Mass Index (BMI): 27 Nutrition Risk Assessment Items NUTRITION RISK SCREEN: I have an illness or condition that made me change the kind and/or 0 No amount of food I eat I eat fewer than two meals per day 0 No I eat few fruits and vegetables, or milk products 0 No I have three or more drinks of beer, liquor or wine almost every day 0 No I have tooth or mouth problems that make it hard for me to eat 0 No I don't always have enough money to buy the food I need 0 No I eat alone most of the time 0 No I take three or more different prescribed or over-the-counter drugs a 0 No day Without wanting to, I have lost or gained 10 pounds in the last six 0 No months I am not always physically able to shop, cook and/or feed myself 0 No Nutrition Protocols Good Risk Protocol 0 No interventions needed Moderate Risk Protocol Electronic Signature(s) Signed: 08/26/2015 4:21:51 PM By: Regan Lemming BSN, RN Entered By: Regan Lemming on 08/26/2015 13:38:43

## 2015-08-26 NOTE — Progress Notes (Addendum)
MCKINNON, GUMMO ([id number]) Visit Report for 08/26/2015 Allergy List Details Patient Name: [name], [name] Date of Service: 08/26/2015 1:30 PM Medical Record Number: [id number] Patient Account Number: [id number] Date of Birth/Sex: 29-Jan-1955 (60 y.o. Male) Treating RN: Baruch Gouty, RN, BSN, Velva [place] Primary Care Physician: Annye Asa Other Clinician: Referring Physician: Maryan Puls Treating Physician/Extender: Ricard Dillon Weeks in Treatment: 0 Allergies Active Allergies no known allergies Allergy Notes Electronic Signature(s) Signed: 08/26/2015 1:25:49 PM By: Regan Lemming BSN, RN Entered By: Regan Lemming on 08/26/2015 13:25:49 [name], [name] ([id number]) -------------------------------------------------------------------------------- [place] Details Patient Name: [name] Date of Service: 08/26/2015 1:30 PM Medical Record Number: [id number] Patient Account Number: [id number] Date of Birth/Sex: 11-Jan-1955 (60 y.o. Male) Treating RN: Baruch Gouty, RN, BSN, Velva Harman Primary Care Physician: Annye Asa Other Clinician: Referring Physician: Maryan Puls Treating Physician/Extender: Tito Dine in Treatment: 0 Visit Information Patient Arrived: Ambulatory Arrival Time: 13:31 Accompanied By: self Transfer Assistance: None Patient Identification Verified: Yes Secondary Verification Process Yes Completed: Patient Requires Transmission-Based No Precautions: Patient Has Alerts: No Electronic Signature(s) Signed: 08/26/2015 4:21:51 PM By: Regan Lemming BSN, RN Entered By: Regan Lemming on 08/26/2015 13:35:10 [name], [name] ([id number]) -------------------------------------------------------------------------------- Clinic Level of Care Assessment Details Patient Name: [name] Date of Service: 08/26/2015 1:30 PM Medical Record Number: [id number] Patient Account Number: [id number] Date of Birth/Sex: 1955-03-24 (60 y.o. Male) Treating RN: Baruch Gouty, RN,  BSN, Velva Harman Primary Care Physician: Annye Asa Other Clinician: Referring Physician: Maryan Puls Treating Physician/Extender: Tito Dine in Treatment: 0 Clinic Level of Care Assessment Items TOOL 2 Quantity Score []  - Use when only an EandM is performed on the INITIAL visit 0 ASSESSMENTS - Nursing Assessment / Reassessment X - General Physical Exam (combine w/ comprehensive assessment (listed just 1 20 below) when performed on new pt. evals) X - Comprehensive Assessment (HX, ROS, Risk Assessments, Wounds Hx, etc.) 1 25 ASSESSMENTS - Wound and Skin Assessment / Reassessment []  - Simple Wound Assessment / Reassessment - one wound 0 []  - Complex Wound Assessment / Reassessment - multiple wounds 0 []  - Dermatologic / Skin Assessment (not related to wound area) 0 ASSESSMENTS - Ostomy and/or Continence Assessment and Care []  - Incontinence Assessment and Management 0 []  - Ostomy Care Assessment and Management (repouching, etc.) 0 PROCESS - Coordination of Care X - Simple Patient / Family Education for ongoing care 1 15 []  - Complex (extensive) Patient / Family Education for ongoing care 0 []  - Staff obtains Programmer, systems, Records, Test Results / Process Orders 0 []  - Staff telephones HHA, Nursing Homes / Clarify orders / etc 0 []  - Routine Transfer to another Facility (non-emergent condition) 0 []  - Routine Hospital Admission (non-emergent condition) 0 X - New Admissions / Biomedical engineer / Ordering NPWT, Apligraf, etc. 1 15 []  - Emergency Hospital Admission (emergent condition) 0 []  - Simple Discharge Coordination 0 [name], [name] ([id number]) []  - Complex (extensive) Discharge Coordination 0 PROCESS - Special Needs []  - Pediatric / Minor Patient Management 0 []  - Isolation Patient Management 0 []  - Hearing / Language / Visual special needs 0 []  - Assessment of Community assistance (transportation, D/C planning, etc.) 0 []  - Additional assistance / Altered  mentation 0 []  - Support Surface(s) Assessment (bed, cushion, seat, etc.) 0 INTERVENTIONS - Wound Cleansing / Measurement []  - Wound Imaging (photographs - any number of wounds) 0 []  - Wound Tracing (instead of photographs) 0 []  - Simple Wound Measurement - one wound 0 []  - Complex Wound Measurement - multiple wounds 0 []  -  Simple Wound Cleansing - one wound 0 []  - Complex Wound Cleansing - multiple wounds 0 INTERVENTIONS - Wound Dressings []  - Small Wound Dressing one or multiple wounds 0 []  - Medium Wound Dressing one or multiple wounds 0 []  - Large Wound Dressing one or multiple wounds 0 []  - Application of Medications - injection 0 INTERVENTIONS - Miscellaneous []  - External ear exam 0 []  - Specimen Collection (cultures, biopsies, blood, body fluids, etc.) 0 []  - Specimen(s) / Culture(s) sent or taken to Lab for analysis 0 []  - Patient Transfer (multiple staff / Harrel Lemon Lift / Similar devices) 0 []  - Simple Staple / Suture removal (25 or less) 0 []  - Complex Staple / Suture removal (26 or more) 0 [name], [name] ([id number]) []  - Hypo / Hyperglycemic Management (close monitor of Blood Glucose) 0 []  - Ankle / Brachial Index (ABI) - do not check if billed separately 0 Has the patient been seen at the hospital within the last three years: Yes Total Score: 75 Level Of Care: New/Established - Level 2 Electronic Signature(s) Signed: 08/26/2015 4:21:51 PM By: Regan Lemming BSN, RN Entered By: Regan Lemming on 08/26/2015 14:29:49 [name], [name] ([id number]) -------------------------------------------------------------------------------- Encounter Discharge Information Details Patient Name: [name] Date of Service: 08/26/2015 1:30 PM Medical Record Number: [id number] Patient Account Number: [id number] Date of Birth/Sex: Feb 09, 1955 (60 y.o. Male) Treating RN: Baruch Gouty, RN, BSN, Velva Harman Primary Care Physician: Annye Asa Other Clinician: Referring Physician: Maryan Puls Treating  Physician/Extender: Tito Dine in Treatment: 0 Encounter Discharge Information Items Discharge Pain Level: 0 Discharge Condition: Stable Ambulatory Status: Ambulatory Discharge Destination: Home Transportation: Private Auto Accompanied By: wife Schedule Follow-up Appointment: No Medication Reconciliation completed and provided to Patient/Care No Jaleen Grupp: Provided on Clinical Summary of Care: 08/26/2015 Form Type Recipient Paper Patient EF Electronic Signature(s) Signed: 08/26/2015 2:31:28 PM By: Ruthine Dose Previous Signature: 08/26/2015 2:30:40 PM Version By: Regan Lemming BSN, RN Entered By: Ruthine Dose on 08/26/2015 14:31:27 [name], [name] ([id number]) -------------------------------------------------------------------------------- Multi Wound Chart Details Patient Name: [name] Date of Service: 08/26/2015 1:30 PM Medical Record Number: [id number] Patient Account Number: [id number] Date of Birth/Sex: 1955-09-15 (60 y.o. Male) Treating RN: Baruch Gouty, RN, BSN, Velva Harman Primary Care Physician: Annye Asa Other Clinician: Referring Physician: Maryan Puls Treating Physician/Extender: Ricard Dillon Weeks in Treatment: 0 Vital Signs Height(in): 65 Pulse(bpm): 75 Weight(lbs): 162 Blood Pressure 147/77 (mmHg): Body Mass Index(BMI): 27 Temperature(F): 98.6 Respiratory Rate 18 (breaths/min): Wound Assessments Treatment Notes Electronic Signature(s) Signed: 08/26/2015 4:21:51 PM By: Regan Lemming BSN, RN Entered By: Regan Lemming on 08/26/2015 14:04:26 [name], [name] ([id number]) -------------------------------------------------------------------------------- [name] Details Patient Name: [name] Date of Service: 08/26/2015 1:30 PM Medical Record Number: [id number] Patient Account Number: [id number] Date of Birth/Sex: 1955/06/19 (60 y.o. Male) Treating RN: Baruch Gouty, RN, BSN, [name] Primary Care Physician: Annye Asa Other Clinician: Referring Physician: Maryan Puls Treating Physician/Extender: Tito Dine in Treatment: 0 Active Inactive HBO Nursing Diagnoses: Anxiety related to feelings of confinement associated with the hyperbaric oxygen chamber Anxiety related to knowledge deficit of hyperbaric oxygen therapy and treatment procedures Discomfort related to temperature and humidity changes inside hyperbaric chamber Potential for barotraumas to ears, sinuses, teeth, and lungs or cerebral gas embolism related to changes in atmospheric pressure inside hyperbaric oxygen chamber Potential for oxygen toxicity seizures related to delivery of 100% oxygen at an increased atmospheric pressure Potential for pulmonary oxygen toxicity related to delivery of 100% oxygen at an increased atmospheric pressure Goals: Barotrauma will be prevented during HBO2 Date Initiated:  08/26/2015 Goal Status: Active Patient and/or family will be able to state/discuss factors appropriate to the management of their disease process during treatment Date Initiated: 08/26/2015 Goal Status: Active Patient will tolerate the hyperbaric oxygen therapy treatment Date Initiated: 08/26/2015 Goal Status: Active Patient will tolerate the internal climate of the chamber Date Initiated: 08/26/2015 Goal Status: Active Patient/caregiver will verbalize understanding of HBO goals, rationale, procedures and potential hazards Date Initiated: 08/26/2015 Goal Status: Active Signs and symptoms of pulmonary oxygen toxicity will be recognized and promptly addressed Date Initiated: 08/26/2015 Goal Status: Active Signs and symptoms of seizure will be recognized and promptly addressed ; seizing patients will suffer no harm Date Initiated: 08/26/2015 [name] ([id number]) Goal Status: Active Interventions: Administer a five (5) minute air break for patient if signs and symptoms of seizure appear and notify the hyperbaric  physician Administer a ten (10) minute air break for patient if signs and symptoms of seizure appear and notify the hyperbaric physician Administer decongestants, per physician orders, prior to HBO2 Administer the correct therapeutic gas delivery based on the patients needs and limitations, per physician order Assess and provide for patientos comfort related to the hyperbaric environment and equalization of middle ear Assess for signs and symptoms related to adverse events, including but not limited to confinement anxiety, pneumothorax, oxygen toxicity and baurotrauma Assess patient for any history of confinement anxiety Assess patient's knowledge and expectations regarding hyperbaric medicine and provide education related to the hyperbaric environment, goals of treatment and prevention of adverse events Implement protocols to decrease risk of pneumothorax in high risk patients Notes: Orientation to the Wound Care Program Nursing Diagnoses: Knowledge deficit related to the wound healing center program Goals: Patient/caregiver will verbalize understanding of the [place] Program Date Initiated: 08/26/2015 Goal Status: Active Interventions: Provide education on orientation to the wound center Notes: Electronic Signature(s) Signed: 08/26/2015 4:21:51 PM By: Regan Lemming BSN, RN Entered By: Regan Lemming on 08/26/2015 14:04:01 [name], [name] ([id number]) -------------------------------------------------------------------------------- Pain Assessment Details Patient Name: [name] Date of Service: 08/26/2015 1:30 PM Medical Record Number: [id number] Patient Account Number: [id number] Date of Birth/Sex: 10/13/1955 (60 y.o. Male) Treating RN: Baruch Gouty, RN, BSN, Velva Harman Primary Care Physician: Annye Asa Other Clinician: Referring Physician: Maryan Puls Treating Physician/Extender: Ricard Dillon Weeks in Treatment: 0 Active Problems Location of Pain Severity and  Description of Pain Patient Has Paino No Site Locations With Dressing Change: No Pain Management and Medication Current Pain Management: Electronic Signature(s) Signed: 08/26/2015 4:21:51 PM By: Regan Lemming BSN, RN Entered By: Regan Lemming on 08/26/2015 13:35:19 [name], [name] ([id number]) -------------------------------------------------------------------------------- Patient/Caregiver Education Details Patient Name: [name] Date of Service: 08/26/2015 1:30 PM Medical Record Number: [id number] Patient Account Number: [id number] Date of Birth/Gender: [birth year] (60 y.o. Male) Treating RN: Baruch Gouty, RN, BSN, Velva Harman Primary Care Physician: Annye Asa Other Clinician: Referring Physician: Maryan Puls Treating Physician/Extender: Tito Dine in Treatment: 0 Education Assessment Education Provided To: Patient Education Topics Provided Welcome To [facility] [place]: Methods: Explain/Verbal Responses: State content correctly Electronic Signature(s) Signed: 08/26/2015 4:21:51 PM By: Regan Lemming BSN, RN Entered By: Regan Lemming on 08/26/2015 14:31:46 [name], [name] ([id number]) -------------------------------------------------------------------------------- [name] Details Patient Name: [name] Date of Service: 08/26/2015 1:30 PM Medical Record Number: [id number] Patient Account Number: [id number] Date of Birth/Sex: November 21, 1955 (60 y.o. Male) Treating RN: Baruch Gouty, RN, BSN, Velva Harman Primary Care Physician: Annye Asa Other Clinician: Referring Physician: Maryan Puls Treating Physician/Extender: Ricard Dillon Weeks in Treatment: 0 Vital Signs Time Taken: 13:36 Temperature (F): 98.6 Height (  in): 65 Pulse (bpm): 75 Source: Stated Respiratory Rate (breaths/min): 18 Weight (lbs): 162 Blood Pressure (mmHg): 147/77 Source: Stated Reference Range: 80 - 120 mg / dl Body Mass Index (BMI): 27 Electronic Signature(s) Signed: 08/26/2015 4:21:51 PM  By: Regan Lemming BSN, RN Entered By: Regan Lemming on 08/26/2015 13:37:08

## 2015-08-26 NOTE — Progress Notes (Signed)
MONTAVIUS, WILKS ([id number]) Visit Report for 08/26/2015 HBO Risk Assessment Details Patient Name: [name], [name] Date of Service: 08/26/2015 1:30 PM Medical Record Patient Account Number: [id number] Number: Treating RN: Baruch Gouty, RN, BSN, Rita 1955-12-18 [phone] y.o. Other Clinician: Date of Birth/Sex: Male) Treating ROBSON, [place]brook Primary Care Physician: Annye Asa Physician/Extender: G Referring Physician: Lily Peer in Treatment: 0 HBO Risk Assessment Items Answer Barotrauma Risks: Upper Respiratory Infections No Prior Radiation Treatment to Head/Neck No Tracheostomy No Ear problems or surgery (otosclerosis)- Consider pressure equalization tubes No Sinus Problems, Sinus Obstruction No Pulmonary Risks: Currently seeing a pulmonologisto No Emphysema No Pneumothorax No Tuberculosis No Other lung problems (COPD with CO2 retention, lesions, surgery) -Refer to CPGs No Congestive heart Failure -Consider holding HBO if ejection fraction<30% No History of smoking No Bullous Disease, Blebs No Other pulmonary abnormalities No Cardiac Risks: Currently seeing a cardiologisto No Pacemaker/AICD No Hypertension No Diuretic Used (water pill). If yes, last time taken: No History of prior or current malignancy (Cancer) Surgery No Radiation therapy Yes If Yes for Radiation Therapy, number of treatments received: 150 Chemotherapy No Faso, Suhaib ([id number]) Ophthalmic Risks: Optic Neuritis No Cataracts No Myopia No Retinopathy or Retinal Detachment Surgery- Consider pressure equalization tubes No Confinement Anxiety Claustrophobia Yes Dialysis Dialysis No Any implants; medical or non-medical No Pregnancy No Diabetes HgbA1C within 3 months No Seizures Seizures No Currently using these medications: Aspirin No Digoxin (CHF patient) No Narcotics No Nitroprusside No Phenothiazine (Thorazine,etc.) No Prednisone or other steroids No Disulfiram (Antabuse)  No Mafenide Acetate (Sulfamylon-burn cream) No Amiodarone No Electronic Signature(s) Signed: 08/26/2015 4:21:51 PM By: Regan Lemming BSN, RN Entered By: Regan Lemming on 08/26/2015 14:08:07

## 2015-08-27 ENCOUNTER — Telehealth: Payer: Self-pay | Admitting: Family Medicine

## 2015-08-27 NOTE — Telephone Encounter (Signed)
Waiting on PA from pharmacy.

## 2015-08-27 NOTE — Telephone Encounter (Signed)
Began PA with covermymeds today.

## 2015-08-27 NOTE — Telephone Encounter (Signed)
Pt states that [facility] needs a prior auth before filling his cialis.

## 2015-08-27 NOTE — Progress Notes (Addendum)
SAMVEL, ZINN ([id number]) Visit Report for 08/26/2015 Chief Complaint Document Details Patient Name: [name], [name] Date of Service: 08/26/2015 1:30 PM Medical Record Patient Account Number: [id number] Number: Treating RN: Baruch Gouty, RN, BSN, Rita October 16, 1955 [phone] y.o. Other Clinician: Date of Birth/Sex: Male) Treating ROBSON, [name] Primary Care Physician: Annye Asa Physician/Extender: G Referring Physician: Letha Cape in Treatment: 0 Information Obtained from: Patient Chief Complaint 08/26/15; patient is referred for consideration of hyperbaric oxygen treatment secondary to radiation cystitis Electronic Signature(s) Signed: 08/27/2015 7:31:48 AM By: Linton Ham MD Entered By: Linton Ham on 08/26/2015 14:57:42 [name], [name] ([id number]) -------------------------------------------------------------------------------- HPI Details Patient Name: [name] Date of Service: 08/26/2015 1:30 PM Medical Record Patient Account Number: [id number] Number: Treating RN: Baruch Gouty, RN, BSN, Rita 1955/09/01 [phone] y.o. Other Clinician: Date of Birth/Sex: Male) Treating ROBSON, [name] Primary Care Physician: Annye Asa Physician/Extender: G Referring Physician: Letha Cape in Treatment: 0 History of Present Illness HPI Description: 08/26/15; this is a patient who is been referred from his urologist Dr. 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 The patient was diagnosed as having adenocarcinoma of the prostate T-2 B N0 M0 Gleason score of 7 in 2013. This was discovered based on a screening PSA of 7.5, the patient was not symptomatic at that time he tells me. He elected to to undergo combination therapy. He received 4500 cGy terminal beam radiation over 5 weeks followed by I/125 seed implantation. Total of 57 seeds were placed with a total mCi dosage of 19.9 mCi.  He also received anti androgen therapy. The patient tells me he did quite well and really was asymptomatic until roughly 3-4 weeks ago when he developed acute urinary retention vomiting a trip to the emergency room. Apparently prior to the urinary retention he had passed a few small clots in his urine. He required a catheter placement on July 29. Bladder scan at that time showed greater than 500 cc of urine in his bladder. The catheter was removed however the patient could not void. He has since been doing in and out catheterizations. 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 Erythematous patches were noted within the bladder wall compatible with radiation cystitis. Urinalysis was obtained I don't have these results. The patient was started on Repaflo to help with the retention. He is still having some frank bleeding this morning according to his wife. He feels as though his spontaneous voiding is improving however he is still doing when necessary in and out caths Electronic Signature(s) Signed: 08/27/2015 7:31:48 AM By: Linton Ham MD Entered By: Linton Ham on 08/26/2015 15:22:33 [name], [name] ([id number]) -------------------------------------------------------------------------------- Physical Exam Details Patient Name: [name] Date of Service: 08/26/2015 1:30 PM Medical Record Patient Account Number: [id number] Number: Treating RN: Baruch Gouty, RN, BSN, Rita 1955-10-28 [phone] y.o. Other Clinician: Date of Birth/Sex: Male) Treating ROBSON, [name] Primary Care Physician: Annye Asa Physician/Extender: G Referring Physician: Letha Cape in Treatment: 0 Constitutional Sitting or standing Blood Pressure is within target range for patient.. Pulse regular and within target range for patient.Marland Kitchen Respirations regular, non-labored and within target range.. Temperature is normal and within the target range for the  patient.. Patient's appearance is neat and clean. Appears in no acute distress. Well nourished and well developed.Marland Kitchen Respiratory Respiratory effort is easy and symmetric bilaterally. Rate is normal at rest and on room air.. Bilateral breath sounds are clear and equal in all lobes with no wheezes, rales  or rhonchi.. Cardiovascular Heart rhythm and rate regular, without murmur or gallop.. Gastrointestinal (GI) Abdomen is soft and non-distended without masses or tenderness. Bowel sounds active in all quadrants.. No liver or spleen enlargement or tenderness.. Lymphatic . No lymphadenopathy or glandular swelling noted in groin. Bladder is not distended. Psychiatric No evidence of depression, anxiety, or agitation. Calm, cooperative, and communicative. Appropriate interactions and affect.. Electronic Signature(s) Signed: 08/27/2015 7:31:48 AM By: Linton Ham MD Entered By: Linton Ham on 08/26/2015 16:02:57 [name], [name] ([id number]) -------------------------------------------------------------------------------- Physician Orders Details Patient Name: [name] Date of Service: 08/26/2015 1:30 PM Medical Record Patient Account Number: [id number] Number: Treating RN: Baruch Gouty, RN, BSN, Rita 1955-11-20 [phone] y.o. Other Clinician: Date of Birth/Sex: Male) Treating ROBSON, [name] Primary Care Physician: Annye Asa Physician/Extender: G Referring Physician: Letha Cape in Treatment: 0 Verbal / Phone Orders: Yes Clinician: Afful, RN, BSN, Rita Read Back and Verified: Yes Diagnosis Coding Follow-up Appointments o Return Appointment in 2 weeks. Hyperbaric Oxygen Therapy o Evaluate for HBO Therapy o Indication: - Radiation Cystitis o 2.0 ATA for 90 Minutes without Air Breaks o One treatment per day (delivered Monday through Friday unless otherwise specified in Special Instructions below): o Total # of Treatments: - 40 o Finger stick  Blood Glucose Pre- and Post- HBOT Treatment. Electronic Signature(s) Signed: 08/27/2015 7:31:48 AM By: Linton Ham MD Entered By: Linton Ham on 08/26/2015 16:13:15 [name], [name] ([id number]) -------------------------------------------------------------------------------- Problem List Details Patient Name: [name] Date of Service: 08/26/2015 1:30 PM Medical Record Patient Account Number: [id number] Number: Treating RN: Baruch Gouty, RN, BSN, Rita 02-Dec-1955 [phone] y.o. Other Clinician: Date of Birth/Sex: Male) Treating ROBSON, [name] Primary Care Physician: Annye Asa Physician/Extender: G Referring Physician: Letha Cape in Treatment: 0 Active Problems ICD-10 Encounter Code Description Active Date Diagnosis L59.8 Other specified disorders of the skin and subcutaneous 08/26/2015 Yes tissue related to radiation N30.41 Irradiation cystitis with hematuria 08/26/2015 Yes R33.8 Other retention of urine 08/26/2015 Yes Z85.46 Personal history of malignant neoplasm of prostate 08/26/2015 Yes Inactive Problems Resolved Problems Electronic Signature(s) Signed: 08/27/2015 7:31:48 AM By: Linton Ham MD Entered By: Linton Ham on 08/26/2015 15:18:55 [name], [name] ([id number]) -------------------------------------------------------------------------------- Progress Note Details Patient Name: [name] Date of Service: 08/26/2015 1:30 PM Medical Record Patient Account Number: [id number] Number: Treating RN: Baruch Gouty, RN, BSN, Rita 1955-05-16 [phone] y.o. Other Clinician: Date of Birth/Sex: Male) Treating ROBSON, [name] Primary Care Physician: Annye Asa Physician/Extender: G Referring Physician: Letha Cape in Treatment: 0 Subjective Chief Complaint Information obtained from Patient 08/26/15; patient is referred for consideration of hyperbaric oxygen treatment secondary to radiation cystitis History of Present  Illness (HPI) 08/26/15; this is a patient who is been referred from his urologist Dr. 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 The patient was diagnosed as having adenocarcinoma of the prostate T-2 B N0 M0 Gleason score of 7 in 2013. This was discovered based on a screening PSA of 7.5, the patient was not symptomatic at that time he tells me. He elected to to undergo combination therapy. He received 4500 cGy terminal beam radiation over 5 weeks followed by I/125 seed implantation. Total of 57 seeds were placed with a total mCi dosage of 19.9 mCi. He also received anti androgen therapy. The patient tells me he did quite well and really was asymptomatic until roughly 3-4 weeks ago when he developed acute urinary retention vomiting a trip to the emergency room. Apparently prior to the urinary retention he had  passed a few small clots in his urine. He required a catheter placement on July 29. Bladder scan at that time showed greater than 500 cc of urine in his bladder. The catheter was removed however the patient could not void. He has since been doing in and out catheterizations. 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 Erythematous patches were noted within the bladder wall compatible with radiation cystitis. Urinalysis was obtained I don't have these results. The patient was started on Repaflo to help with the retention. He is still having some frank bleeding this morning according to his wife. He feels as though his spontaneous voiding is improving however he is still doing when necessary in and out caths Wound History Patient reportedly has not tested positive for osteomyelitis. Patient reportedly has not had testing performed to evaluate circulation in the legs. Patient History Information obtained from Patient. Allergies no known allergies Family History Cancer - Father, Heart  Disease - Siblings, Siblings, Father, Mother, Paternal Tramaine, Sauls, Kym ([id number]) Grandparents, Hypertension - Siblings, Siblings, Father, Father, Mother, Paternal Grandparents, Lung Disease - Siblings, Siblings, Stroke - Siblings, No family history of Diabetes, Hereditary Spherocytosis, Kidney Disease, Seizures, Thyroid Problems, Tuberculosis. Social History Never smoker, Marital Status - Married, Alcohol Use - Never, Drug Use - No History, Caffeine Use - Rarely. Medical History Eyes Denies history of Cataracts, Glaucoma, Optic Neuritis Ear/Nose/Mouth/Throat Denies history of Chronic sinus problems/congestion, Middle ear problems Hematologic/Lymphatic Denies history of Anemia, Hemophilia, Human Immunodeficiency Virus, Lymphedema, Sickle Cell Disease Respiratory Denies history of Aspiration, Asthma, Chronic Obstructive Pulmonary Disease (COPD), Pneumothorax, Sleep Apnea, Tuberculosis Cardiovascular Patient has history of Hypertension Denies history of Angina, Arrhythmia, Congestive Heart Failure, Coronary Artery Disease, Hypotension, Myocardial Infarction, Peripheral Arterial Disease, Peripheral Venous Disease, Phlebitis, Vasculitis Gastrointestinal Denies history of Cirrhosis , Colitis, Crohn s, Hepatitis A, Hepatitis B, Hepatitis C Endocrine Denies history of Type I Diabetes, Type II Diabetes Genitourinary Denies history of End Stage Renal Disease Immunological Denies history of Lupus Erythematosus, Raynaud s, Scleroderma Integumentary (Skin) Denies history of History of Burn, History of pressure wounds Musculoskeletal Denies history of Gout, Rheumatoid Arthritis, Osteoarthritis, Osteomyelitis Neurologic Denies history of Dementia, Neuropathy, Quadriplegia, Paraplegia, Seizure Disorder Oncologic Patient has history of Received Radiation Psychiatric Denies history of Anorexia/bulimia, Confinement Anxiety Medical And Surgical History  Notes Genitourinary difficult voiding Review of Systems (ROS) Constitutional Symptoms (General Health) The patient has no complaints or symptoms. Eyes The patient has no complaints or symptoms. Ear/Nose/Mouth/Throat [place], Tyan ([id number]) The patient has no complaints or symptoms. Hematologic/Lymphatic The patient has no complaints or symptoms. Respiratory The patient has no complaints or symptoms. Cardiovascular The patient has no complaints or symptoms. Gastrointestinal The patient has no complaints or symptoms. Endocrine The patient has no complaints or symptoms. Immunological The patient has no complaints or symptoms. Integumentary (Skin) The patient has no complaints or symptoms. Musculoskeletal The patient has no complaints or symptoms. Neurologic The patient has no complaints or symptoms. Oncologic The patient has no complaints or symptoms. Psychiatric The patient has no complaints or symptoms. Objective Constitutional Sitting or standing Blood Pressure is within target range for patient.. Pulse regular and within target range for patient.Marland Kitchen Respirations regular, non-labored and within target range.. Temperature is normal and within the target range for the patient.. Patient's appearance is neat and clean. Appears in no acute distress. Well nourished and well developed.. Vitals Time Taken: 1:36 PM, Height: 65 in, Source: Stated, Weight: 162 lbs, Source: Stated, BMI: 27, Temperature: 98.6  F, Pulse: 75 bpm, Respiratory Rate: 18 breaths/min, Blood Pressure: 147/77 mmHg. Respiratory Respiratory effort is easy and symmetric bilaterally. Rate is normal at rest and on room air.. Bilateral breath sounds are clear and equal in all lobes with no wheezes, rales or rhonchi.. Cardiovascular Heart rhythm and rate regular, without murmur or gallop.. Gastrointestinal (GI) Abdomen is soft and non-distended without masses or tenderness. Bowel sounds active in all  quadrants.Marland Kitchen [name], [name] ([id number]) No liver or spleen enlargement or tenderness.. Lymphatic No lymphadenopathy or glandular swelling noted in groin. Bladder is not distended. Psychiatric No evidence of depression, anxiety, or agitation. Calm, cooperative, and communicative. Appropriate interactions and affect.. Assessment Active Problems ICD-10 L59.8 - Other specified disorders of the skin and subcutaneous tissue related to radiation N30.41 - Irradiation cystitis with hematuria R33.8 - Other retention of urine Z85.46 - Personal history of malignant neoplasm of prostate Plan Follow-up Appointments: Return Appointment in 2 weeks. Hyperbaric Oxygen Therapy: Evaluate for HBO Therapy Indication: - Radiation Cystitis 2.0 ATA for 90 Minutes without Air Breaks One treatment per day (delivered Monday through Friday unless otherwise specified in Special Instructions below): Total # of Treatments: - 40 Finger stick Blood Glucose Pre- and Post- HBOT Treatment. #1 patient has radiation cystitis with acute symptoms. The patient had a cystoscopy on 08/19/15 this showed numerous clots within the bladder that required evacuation. There were also numerous erythematous [name], [name] ([id number]) patches on the anterior and posterior bladder walls with areas of active hemorrhage. He has potential to benefit from hyperbaric oxygen 40 treatments, 2 atm. #2 chemotherapy would be reduction in episodes of urinary bleeding, urinary frequency and potentially improvement in bladder function. The patient is anxious to proceed, states he is wanting to go back to usual activities such as jogging. Electronic Signature(s) Signed: 09/05/2015 11:40:27 AM By: Gretta Cool RN, BSN, Kim RN, BSN Signed: 09/18/2015 7:48:57 AM By: Linton Ham MD Previous Signature: 08/27/2015 7:31:48 AM Version By: Linton Ham MD Entered By: Gretta Cool RN, BSN, Kim on 09/05/2015 11:40:27 [name], [name]  ([id number]) -------------------------------------------------------------------------------- ROS/PFSH Details Patient Name: [name] Date of Service: 08/26/2015 1:30 PM Medical Record Patient Account Number: [id number] Number: Treating RN: Baruch Gouty, RN, BSN, Rita 02-21-1955 [phone] y.o. Other Clinician: Date of Birth/Sex: Male) Treating ROBSON, [name] Primary Care Physician: Annye Asa Physician/Extender: G Referring Physician: Letha Cape in Treatment: 0 Information Obtained From Patient Wound History Do you currently have one or more open woundso No Have you tested positive for osteomyelitis (bone infection)o No Have you had any tests for circulation on your legso No Constitutional Symptoms (General Health) Complaints and Symptoms: No Complaints or Symptoms Eyes Complaints and Symptoms: No Complaints or Symptoms Medical History: Negative for: Cataracts; Glaucoma; Optic Neuritis Ear/Nose/Mouth/Throat Complaints and Symptoms: No Complaints or Symptoms Medical History: Negative for: Chronic sinus problems/congestion; Middle ear problems Hematologic/Lymphatic Complaints and Symptoms: No Complaints or Symptoms Medical History: Negative for: Anemia; Hemophilia; Human Immunodeficiency Virus; Lymphedema; Sickle Cell Disease Respiratory Complaints and Symptoms: No Complaints or Symptoms [name], [name] ([id number]) Medical History: Negative for: Aspiration; Asthma; Chronic Obstructive Pulmonary Disease (COPD); Pneumothorax; Sleep Apnea; Tuberculosis Cardiovascular Complaints and Symptoms: No Complaints or Symptoms Medical History: Positive for: Hypertension Negative for: Angina; Arrhythmia; Congestive Heart Failure; Coronary Artery Disease; Hypotension; Myocardial Infarction; Peripheral Arterial Disease; Peripheral Venous Disease; Phlebitis; Vasculitis Gastrointestinal Complaints and Symptoms: No Complaints or Symptoms Medical  History: Negative for: Cirrhosis ; Colitis; Crohnos; Hepatitis A; Hepatitis B; Hepatitis C Endocrine Complaints and Symptoms: No Complaints or Symptoms Medical History: Negative for: Type I  Diabetes; Type II Diabetes Genitourinary Medical History: Negative for: End Stage Renal Disease Past Medical History Notes: difficult voiding Immunological Complaints and Symptoms: No Complaints or Symptoms Medical History: Negative for: Lupus Erythematosus; Raynaudos; Scleroderma Integumentary (Skin) Complaints and Symptoms: No Complaints or Symptoms Medical HistoryAYANSH, [name] ([id number]) Negative for: History of Burn; History of pressure wounds Musculoskeletal Complaints and Symptoms: No Complaints or Symptoms Medical History: Negative for: Gout; Rheumatoid Arthritis; Osteoarthritis; Osteomyelitis Neurologic Complaints and Symptoms: No Complaints or Symptoms Medical History: Negative for: Dementia; Neuropathy; Quadriplegia; Paraplegia; Seizure Disorder Oncologic Complaints and Symptoms: No Complaints or Symptoms Medical History: Positive for: Received Radiation Psychiatric Complaints and Symptoms: No Complaints or Symptoms Medical History: Negative for: Anorexia/bulimia; Confinement Anxiety Family and Social History Cancer: Yes - Father; Diabetes: No; Heart Disease: Yes - Siblings, Siblings, Father, Mother, Paternal Grandparents, Maternal Grandparents; Hereditary Spherocytosis: No; Hypertension: Yes - Siblings, Siblings, Father, Father, Mother, Paternal Grandparents; Kidney Disease: No; Lung Disease: Yes - Siblings, Siblings; Seizures: No; Stroke: Yes - Siblings; Thyroid Problems: No; Tuberculosis: No; Never smoker; Marital Status - Married; Alcohol Use: Never; Drug Use: No History; Caffeine Use: Rarely; Financial Concerns: No; Food, Clothing or Shelter Needs: No; Support System Lacking: No; Transportation Concerns: No; Advanced Directives: No; Living Will: No Electronic  Signature(s) Signed: 08/26/2015 4:21:51 PM By: Regan Lemming BSN, RN Signed: 08/27/2015 7:31:48 AM By: Linton Ham MD Entered By: Regan Lemming on 08/26/2015 13:42:49 [name], [name] ([id number]) -------------------------------------------------------------------------------- SuperBill Details Patient Name: [name] Date of Service: 08/26/2015 Medical Record Patient Account Number: [id number] Number: Treating RN: Baruch Gouty, RN, BSN, Rita 04/19/1955 [phone] y.o. Other Clinician: Date of Birth/Sex: Male) Treating ROBSON, Fenton Primary Care Physician: Annye Asa Physician/Extender: G Referring Physician: Letha Cape in Treatment: 0 Diagnosis Coding ICD-10 Codes Code Description L59.8 Other specified disorders of the skin and subcutaneous tissue related to radiation N30.41 Irradiation cystitis with hematuria R33.8 Other retention of urine Z85.46 Personal history of malignant neoplasm of prostate Facility Procedures CPT4 Code: [id number] Description: 86767 - WOUND CARE VISIT-LEV 2 EST PT Modifier: Quantity: 1 Physician Procedures CPT4: Description Modifier Quantity Code [id number] - WC PHYS LEVEL 4 - NEW PT 1 ICD-10 Description Diagnosis L59.8 Other specified disorders of the skin and subcutaneous tissue related to radiation N30.41 Irradiation cystitis with hematuria Electronic Signature(s) Signed: 08/26/2015 4:20:51 PM By: Regan Lemming BSN, RN Signed: 08/27/2015 7:31:48 AM By: Linton Ham MD Entered By: Regan Lemming on 08/26/2015 16:20:51

## 2015-08-29 NOTE — Telephone Encounter (Signed)
Pt informed that this PA was denied, and per dr. Birdie Riddle it may be easier for Dr. Boneta Lucks to get this approved or have another rx prescribed. Pt stated an understanding.

## 2015-08-30 ENCOUNTER — Ambulatory Visit
Admission: RE | Admit: 2015-08-30 | Discharge: 2015-08-30 | Disposition: A | Discharge status: Home / Self Care | Payer: Federal, State, Local not specified - PPO | Source: Ambulatory Visit | Attending: Internal Medicine | Admitting: Internal Medicine

## 2015-08-30 ENCOUNTER — Other Ambulatory Visit: Payer: Self-pay | Admitting: Internal Medicine

## 2015-08-30 DIAGNOSIS — Z01818 Encounter for other preprocedural examination: Secondary | ICD-10-CM | POA: Insufficient documentation

## 2015-08-30 DIAGNOSIS — Z9289 Personal history of other medical treatment: Secondary | ICD-10-CM

## 2015-08-30 DIAGNOSIS — I10 Essential (primary) hypertension: Secondary | ICD-10-CM | POA: Diagnosis not present

## 2015-09-01 ENCOUNTER — Encounter: Payer: Federal, State, Local not specified - PPO | Admitting: Surgery

## 2015-09-01 DIAGNOSIS — Z8546 Personal history of malignant neoplasm of prostate: Secondary | ICD-10-CM | POA: Diagnosis not present

## 2015-09-01 DIAGNOSIS — I10 Essential (primary) hypertension: Secondary | ICD-10-CM | POA: Diagnosis not present

## 2015-09-01 DIAGNOSIS — R338 Other retention of urine: Secondary | ICD-10-CM | POA: Diagnosis not present

## 2015-09-01 DIAGNOSIS — L598 Other specified disorders of the skin and subcutaneous tissue related to radiation: Secondary | ICD-10-CM | POA: Diagnosis not present

## 2015-09-01 DIAGNOSIS — N3041 Irradiation cystitis with hematuria: Secondary | ICD-10-CM | POA: Diagnosis not present

## 2015-09-01 DIAGNOSIS — Z8249 Family history of ischemic heart disease and other diseases of the circulatory system: Secondary | ICD-10-CM | POA: Diagnosis not present

## 2015-09-01 NOTE — Progress Notes (Signed)
ACXEL, TUMMINIA ([id number]) Visit Report for 09/01/2015 Arrival Information Details Patient Name: [name] Date of Service: 09/01/2015 10:00 AM Medical Record Number: [id number] Patient Account Number: [id number] Date of Birth/Sex: 20-Feb-1955 (60 y.o. Male) Treating RN: Primary Care Physician: [name] Other Clinician: Jacqulyn [name] Referring Physician: Annye [name] Treating Physician/Extender: [name] in Treatment: 0 Visit Information History Since Last Visit Added or deleted any medications: No Patient Arrived: Ambulatory Any new allergies or adverse reactions: No Arrival Time: 09:30 Had a fall or experienced change in No Accompanied By: self activities of daily living that may affect Transfer Assistance: None risk of falls: Patient Identification Verified: Yes Signs or symptoms of abuse/neglect since last No Secondary Verification Process Yes visito Completed: Hospitalized since last visit: No Patient Requires Transmission-Based No Pain Present Now: No Precautions: Patient Has Alerts: No Electronic Signature(s) Signed: 09/01/2015 1:55:11 PM By: [name] [name], [name], [name] Entered By: [name] on 09/01/2015 10:19:28 [name] ([id number]) -------------------------------------------------------------------------------- Encounter Discharge Information Details Patient Name: [name] Date of Service: 09/01/2015 10:00 AM Medical Record Number: [id number] Patient Account Number: [id number] Date of Birth/Sex: 1955/10/13 (60 y.o. Male) Treating RN: Primary Care Physician: [name] Other Clinician: Jacqulyn [name] Referring Physician: Annye [name] Treating Physician/Extender: [name] in Treatment: 0 Encounter Discharge Information Items Discharge Pain Level: 0 Discharge Condition: Stable Ambulatory Status: Ambulatory Discharge Destination:  Home Private Transportation: Auto Accompanied By: self Schedule Follow-up Appointment: No Medication Reconciliation completed and No provided to Patient/Care Lindzy Rupert: Clinical Summary of Care: Notes Patient has an HBO treatment scheduled on 09/02/15 at 10:00 am. Electronic Signature(s) Signed: 09/01/2015 1:55:11 PM By: [name] [name], [name], [name] Entered By: [name] on 09/01/2015 12:18:39 [name] ([id number]) -------------------------------------------------------------------------------- Vitals Details Patient Name: [name] Date of Service: 09/01/2015 10:00 AM Medical Record Number: [id number] Patient Account Number: [id number] Date of Birth/Sex: 04/08/1955 (60 y.o. Male) Treating RN: Primary Care Physician: [name] Other Clinician: Jacqulyn [name] Referring Physician: Annye [name] Treating Physician/Extender: [name] in Treatment: 0 Vital Signs Time Taken: 10:10 Temperature (F): 98.2 Height (in): 65 Pulse (bpm): 72 Weight (lbs): 162 Respiratory Rate (breaths/min): 18 Body Mass Index (BMI): 27 Blood Pressure (mmHg): 148/98 Reference Range: 80 - 120 mg / dl Electronic Signature(s) Signed: 09/01/2015 1:55:11 PM By: [name] [name], [name], [name] Entered By: [name], [name] on 09/01/2015 10:20:07

## 2015-09-01 NOTE — Progress Notes (Signed)
[name], [name] ([id number]) Visit Report for 09/01/2015 HBO Details Patient Name: [name], [name] Date of Service: 09/01/2015 10:00 AM Medical Record Number: [id number] Patient Account Number: [id number] Date of Birth/Sex: 1955-07-27 (60 y.o. 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 HBO Treatment Details Treatment Number: 1 Patient Type: Outpatient Chamber Type: Monoplace Chamber Serial #: [id number] Treatment Protocol: 2.0 ATA with 90 minutes oxygen, and no air breaks Treatment Details Compression Rate Down: 1.5 psi / minute De-Compression Rate Up: 1.5 psi / minute Air breaks and breathing Compress Tx Pressure periods Decompress Decompress Begins Reached (leave unused spaces Begins Ends blank) Chamber Pressure (ATA) 1 2 - - - - - - 2 1 Clock Time (24 hr) 10:14 10:24 - - - - - - 11:55 12:06 Treatment Length: 112 (minutes) Treatment Segments: 4 Capillary Blood Glucose Pre Capillary Blood Glucose (mg/dl): Post Capillary Blood Glucose (mg/dl): Vital Signs Capillary Blood Glucose Reference Range: 80 - 120 mg / dl HBO Diabetic Blood Glucose Intervention Range: <131 mg/dl or >249 mg/dl Time Vitals Blood Respiratory Capillary Blood Glucose Pulse Action Type: Pulse: Temperature: Taken: Pressure: Rate: Glucose (mg/dl): Meter #: Oximetry (%) Taken: Pre 10:10 148/98 72 18 98.2 Post 12:10 153/90 60 18 98.3 Pre-Treatment Ear Evaluation Left Right Gradillas, Octave ([id number]) Clear: Yes Clear: Yes Intact: Yes Intact: Yes Left [name] Scale: Grade 0 Right [name] Scale: Grade 0 Treatment Response Treatment Completion  Status: Treatment Completed without Adverse Event Physician Notes heart and lungs were within normal limits and ENT exam normal. HBO Attestation I certify that I supervised this HBO treatment in accordance with Medicare guidelines. A trained Yes emergency response team is readily available per hospital policies and procedures. Continue HBOT as ordered. Yes Electronic Signature(s) Signed: 09/01/2015 12:52:36 PM By: Christin Fudge MD, FACS Entered By: Christin Fudge on 09/01/2015 12:52:36 Guest, Angelus ([id number]) -------------------------------------------------------------------------------- HBO Safety Checklist Details Patient Name: [name] Date of Service: 09/01/2015 10:00 AM Medical Record Number: [id number] Patient Account Number: [id number] Date of Birth/Sex: [birth year] (60 y.o. Male) Treating RN: Primary Care Physician: Annye Asa Other Clinician: Jacqulyn Bath Referring Physician: Annye Asa Treating Physician/Extender: Frann Rider in Treatment: 0 HBO Safety Checklist Items Safety Checklist Consent Form Signed Patient voided / foley secured and emptied When did you last eato 08/31/15 pm Last dose of injectable or oral agent n/a NA Ostomy pouch emptied and vented if applicable NA All implantable devices assessed, documented and approved NA Intravenous access site secured and place Valuables secured Linens and cotton and cotton/polyester blend (less than 51% polyester) Personal oil-based products / skin lotions / body lotions removed NA Wigs or hairpieces removed NA Smoking or tobacco materials removed Books / newspapers / magazines / loose paper removed Cologne, aftershave, perfume and deodorant removed Jewelry removed (may wrap wedding band) Make-up removed NA Hair care products removed Battery operated devices (external) removed Heating patches and chemical warmers removed NA Titanium eyewear removed NA Nail polish cured greater than  10 hours NA Casting material cured greater than 10 hours NA Hearing aids removed Loose dentures or partials removed NA Prosthetics have been removed Patient demonstrates correct use of air break device (if applicable) Patient concerns have been addressed Patient grounding bracelet on and cord attached to chamber Specifics for Inpatients (complete  in addition to above) Medication sheet sent with patient Intravenous medications needed or due during therapy sent with patient JOSIAHS, SEKHON ([id number]) Drainage tubes (e.g. nasogastric tube or chest tube secured and vented) Endotracheal or Tracheotomy tube secured Cuff deflated of air and inflated with saline Airway suctioned Electronic Signature(s) Signed: 09/01/2015 1:55:11 PM By: Lorine Bears RCP, RRT, CHT Entered By: Lorine Bears on 09/01/2015 10:21:19

## 2015-09-02 ENCOUNTER — Encounter: Payer: Federal, State, Local not specified - PPO | Admitting: Internal Medicine

## 2015-09-02 DIAGNOSIS — Z8249 Family history of ischemic heart disease and other diseases of the circulatory system: Secondary | ICD-10-CM | POA: Diagnosis not present

## 2015-09-02 DIAGNOSIS — R338 Other retention of urine: Secondary | ICD-10-CM | POA: Diagnosis not present

## 2015-09-02 DIAGNOSIS — N3041 Irradiation cystitis with hematuria: Secondary | ICD-10-CM | POA: Diagnosis not present

## 2015-09-02 DIAGNOSIS — Z8546 Personal history of malignant neoplasm of prostate: Secondary | ICD-10-CM | POA: Diagnosis not present

## 2015-09-02 DIAGNOSIS — I10 Essential (primary) hypertension: Secondary | ICD-10-CM | POA: Diagnosis not present

## 2015-09-02 DIAGNOSIS — L598 Other specified disorders of the skin and subcutaneous tissue related to radiation: Secondary | ICD-10-CM | POA: Diagnosis not present

## 2015-09-02 NOTE — Progress Notes (Signed)
[name] ([id number]) Visit Report for 09/02/2015 Arrival Information Details Patient Name: [name] Date of Service: 09/02/2015 10:00 AM Medical Record Patient Account Number: [id number] Number: Treating RN: Aug 26, 1955 (60 y.o. Other Clinician: Jacqulyn Bath Date of Birth/Sex: Male) Treating Dellia Nims [place] Primary Care Physician: Annye Asa Physician/Extender: G Referring Physician: Letha Cape in Treatment: 1 Visit Information History Since Last Visit Added or deleted any medications: No Patient Arrived: Ambulatory Any new allergies or adverse reactions: No Arrival Time: 09:30 Had a fall or experienced change in No Accompanied By: self activities of daily living that may affect Transfer Assistance: None risk of falls: Patient Identification Verified: Yes Signs or symptoms of abuse/neglect since last No Secondary Verification Process Yes visito Completed: Hospitalized since last visit: No Patient Requires Transmission-Based No Pain Present Now: No Precautions: Patient Has Alerts: No Electronic Signature(s) Signed: 09/02/2015 1:40:04 PM By: Lorine Bears RCP, RRT, CHT Entered By: Lorine Bears on 09/02/2015 11:21:22 [name] ([id number]) -------------------------------------------------------------------------------- Encounter Discharge Information Details Patient Name: [name] Date of Service: 09/02/2015 10:00 AM Medical Record Patient Account Number: [id number] Number: Treating RN: 1955/05/09 (60 y.o. Other Clinician: Jacqulyn Bath Date of Birth/Sex: Male) Treating Dellia Nims [place] Primary Care Physician: Annye Asa Physician/Extender: G Referring Physician: Letha Cape in Treatment: 1 Encounter Discharge Information Items Discharge Pain Level: 0 Discharge Condition: Stable Ambulatory Status: Ambulatory Discharge Destination:  Home Private Transportation: Auto Accompanied By: self Schedule Follow-up Appointment: No Medication Reconciliation completed and No provided to Patient/Care Niquita Digioia: Clinical Summary of Care: Notes Patient has an HBO treatment scheduled on 09/03/15 at 10:00 am. Electronic Signature(s) Signed: 09/02/2015 1:40:04 PM By: Lorine Bears RCP, RRT, CHT Entered By: Lorine Bears on 09/02/2015 12:15:34 [name] ([id number]) -------------------------------------------------------------------------------- Vitals Details Patient Name: [name] Date of Service: 09/02/2015 10:00 AM Medical Record Patient Account Number: [id number] Number: Treating RN: 1955-01-16 (60 y.o. Other Clinician: Jacqulyn Bath Date of Birth/Sex: Male) Treating ROBSON, [name] Primary Care Physician: Annye Asa Physician/Extender: G Referring Physician: Letha Cape in Treatment: 1 Vital Signs Time Taken: 09:35 Temperature (F): 98.2 Height (in): 65 Pulse (bpm): 78 Weight (lbs): 162 Respiratory Rate (breaths/min): 18 Body Mass Index (BMI): 27 Blood Pressure (mmHg): 138/84 Reference Range: 80 - 120 mg / dl Electronic Signature(s) Signed: 09/02/2015 1:40:04 PM By: Lorine Bears RCP, RRT, CHT Entered By: Lorine Bears on 09/02/2015 11:21:53

## 2015-09-03 ENCOUNTER — Encounter: Payer: Federal, State, Local not specified - PPO | Admitting: Internal Medicine

## 2015-09-03 DIAGNOSIS — L598 Other specified disorders of the skin and subcutaneous tissue related to radiation: Secondary | ICD-10-CM | POA: Diagnosis not present

## 2015-09-03 DIAGNOSIS — N3041 Irradiation cystitis with hematuria: Secondary | ICD-10-CM | POA: Diagnosis not present

## 2015-09-03 DIAGNOSIS — I10 Essential (primary) hypertension: Secondary | ICD-10-CM | POA: Diagnosis not present

## 2015-09-03 DIAGNOSIS — Z8249 Family history of ischemic heart disease and other diseases of the circulatory system: Secondary | ICD-10-CM | POA: Diagnosis not present

## 2015-09-03 DIAGNOSIS — R338 Other retention of urine: Secondary | ICD-10-CM | POA: Diagnosis not present

## 2015-09-03 DIAGNOSIS — Z8546 Personal history of malignant neoplasm of prostate: Secondary | ICD-10-CM | POA: Diagnosis not present

## 2015-09-04 ENCOUNTER — Encounter: Payer: Federal, State, Local not specified - PPO | Admitting: Surgery

## 2015-09-04 DIAGNOSIS — I10 Essential (primary) hypertension: Secondary | ICD-10-CM | POA: Diagnosis not present

## 2015-09-04 DIAGNOSIS — N3041 Irradiation cystitis with hematuria: Secondary | ICD-10-CM | POA: Diagnosis not present

## 2015-09-04 DIAGNOSIS — Z8546 Personal history of malignant neoplasm of prostate: Secondary | ICD-10-CM | POA: Diagnosis not present

## 2015-09-04 DIAGNOSIS — R338 Other retention of urine: Secondary | ICD-10-CM | POA: Diagnosis not present

## 2015-09-04 DIAGNOSIS — L598 Other specified disorders of the skin and subcutaneous tissue related to radiation: Secondary | ICD-10-CM | POA: Diagnosis not present

## 2015-09-04 DIAGNOSIS — Z8249 Family history of ischemic heart disease and other diseases of the circulatory system: Secondary | ICD-10-CM | POA: Diagnosis not present

## 2015-09-04 NOTE — Progress Notes (Signed)
TRAVARIS, WIDRICK ([id number]) Visit Report for 09/04/2015 Arrival Information Details Patient Name: OTTIE, LINDENBAUM Date of Service: 09/04/2015 10:00 AM Medical Record Number: [id number] Patient Account Number: [id number] Date of Birth/Sex: 10-12-1955 (60 y.o. Male) Treating RN: Primary Care Physician: Annye Asa Other Clinician: Jacqulyn Bath Referring Physician: Annye Asa Treating Physician/Extender: Frann Rider in Treatment: 1 Visit Information History Since Last Visit Added or deleted any medications: No Patient Arrived: Ambulatory Any new allergies or adverse reactions: No Arrival Time: 09:30 Had a fall or experienced change in No Accompanied By: self activities of daily living that may affect Transfer Assistance: None risk of falls: Patient Requires Transmission-Based No Signs or symptoms of abuse/neglect since last No Precautions: visito Patient Has Alerts: No Hospitalized since last visit: No Pain Present Now: No Electronic Signature(s) Signed: 09/04/2015 2:46:42 PM By: Lorine Bears RCP, RRT, CHT Entered By: Lorine Bears on 09/04/2015 09:43:52 Cubero, Loic ([id number]) -------------------------------------------------------------------------------- Encounter Discharge Information Details Patient Name: [name] Date of Service: 09/04/2015 10:00 AM Medical Record Number: [id number] Patient Account Number: [id number] Date of Birth/Sex: [birth year] (60 y.o. Male) Treating RN: Primary Care Physician: Annye Asa Other Clinician: Jacqulyn Bath Referring Physician: Annye Asa Treating Physician/Extender: Frann Rider in Treatment: 1 Encounter Discharge Information Items Discharge Pain Level: 0 Discharge Condition: Stable Ambulatory Status: Ambulatory Discharge Destination: Home Private Transportation: Auto Accompanied By: self Schedule Follow-up Appointment: No Medication  Reconciliation completed and No provided to Patient/Care Iolani Twilley: Clinical Summary of Care: Notes Patient has an HBO treatment scheduled on 09/05/15 at 10:00 am. Electronic Signature(s) Signed: 09/04/2015 2:46:42 PM By: Lorine Bears RCP, RRT, CHT Entered By: Lorine Bears on 09/04/2015 12:55:52 Gibeau, Nachman ([id number]) -------------------------------------------------------------------------------- [place] Details Patient Name: [name] Date of Service: 09/04/2015 10:00 AM Medical Record Number: [id number] Patient Account Number: [id number] Date of Birth/Sex: 1955-04-13 (60 y.o. Male) Treating RN: Primary Care Physician: Annye Asa Other Clinician: Jacqulyn Bath Referring Physician: Annye Asa Treating Physician/Extender: Frann Rider in Treatment: 1 Vital Signs Time Taken: 09:35 Temperature (F): 98.4 Height (in): 65 Pulse (bpm): 78 Weight (lbs): 162 Respiratory Rate (breaths/min): 18 Body Mass Index (BMI): 27 Blood Pressure (mmHg): 142/84 Reference Range: 80 - 120 mg / dl Electronic Signature(s) Signed: 09/04/2015 2:46:42 PM By: Lorine Bears RCP, RRT, CHT Entered By: Lorine Bears on 09/04/2015 09:45:06

## 2015-09-04 NOTE — Progress Notes (Signed)
[name], [name] ([id number]) Visit Report for 09/02/2015 HBO Details Patient Name: [name], [name] Date of Service: 09/02/2015 10:00 AM Medical Record Patient Account Number: [id number] Number: Treating RN: [birth year] (60 y.o. 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 HBO Treatment Details Treatment Number: 2 Patient Type: Outpatient Chamber Type: Monoplace Chamber Serial #: [id number] Treatment Protocol: 2.0 ATA with 90 minutes oxygen, and no air breaks Treatment Details Compression Rate Down: 1.5 psi / minute De-Compression Rate Up: 1.5 psi / minute Air breaks and breathing Compress Tx Pressure periods Decompress Decompress Begins Reached (leave unused spaces Begins Ends blank) Chamber Pressure (ATA) 1 2 - - - - - - 2 1 Clock Time (24 hr) 09:56 10:06 - - - - - - 11:36 11:46 Treatment Length: 110 (minutes) Treatment Segments: 4 Capillary Blood Glucose Pre Capillary Blood Glucose (mg/dl): Post Capillary Blood Glucose (mg/dl): Vital Signs Capillary Blood Glucose Reference Range: 80 - 120 mg / dl HBO Diabetic Blood Glucose Intervention Range: <131 mg/dl or >249 mg/dl Time Vitals Blood Respiratory Capillary Blood Glucose Pulse Action Type: Pulse: Temperature: Taken: Pressure: Rate: Glucose (mg/dl): Meter #: Oximetry (%) Taken: Pre 09:35 138/84 78 18 98.2 Post 11:50 156/94 72 18 98.4 [name], [name] ([id number]) Treatment Response Treatment Completion Status: Treatment Completed without Adverse Event Physician Notes No concerns or treatment given HBO Attestation I certify that I  supervised this HBO treatment in accordance with Medicare guidelines. A trained Yes emergency response team is readily available per hospital policies and procedures. Continue HBOT as ordered. Yes Electronic Signature(s) Signed: 09/03/2015 4:32:31 PM By: Linton Ham MD Entered By: Linton Ham on 09/02/2015 12:57:19 [name], [name] ([id number]) -------------------------------------------------------------------------------- HBO Safety Checklist Details Patient Name: [name] Date of Service: 09/02/2015 10:00 AM Medical Record Patient Account Number: [id number] Number: Treating RN: 06-17-1955 (60 y.o. Other Clinician: Jacqulyn Bath Date of Birth/Sex: Male) Treating ROBSON, [place] Primary Care Physician: Annye Asa Physician/Extender: G Referring Physician: Letha Cape in Treatment: 1 HBO Safety Checklist Items Safety Checklist Consent Form Signed Patient voided / foley secured and emptied When did you last eato 09/01/15 pm Last dose of injectable or oral agent n/a NA Ostomy pouch emptied and vented if applicable NA All implantable devices assessed, documented and approved NA Intravenous access site secured and place Valuables secured Linens and cotton and cotton/polyester blend (less than 51% polyester) Personal oil-based products / skin lotions / body lotions removed NA Wigs or hairpieces removed NA Smoking or tobacco materials removed Books / newspapers / magazines / loose paper removed Cologne, aftershave, perfume and deodorant removed Jewelry removed (may wrap wedding band) NA Make-up removed NA Hair care products removed Battery operated devices (external) removed Heating patches and chemical warmers removed NA Titanium eyewear removed NA Nail polish cured greater than 10 hours NA Casting material cured greater than 10 hours NA Hearing aids removed Loose dentures or partials removed NA Prosthetics have been  removed Patient demonstrates correct use of air break device (if applicable) Patient concerns have been addressed Patient grounding bracelet on and cord attached to chamber Specifics for Inpatients (complete in addition to above) Medication sheet sent with patient [name], [name] ([id number]) Intravenous medications needed or due during therapy sent with patient Drainage tubes (e.g. nasogastric tube  or chest tube secured and vented) Endotracheal or Tracheotomy tube secured Cuff deflated of air and inflated with saline Airway suctioned Electronic Signature(s) Signed: 09/02/2015 1:40:04 PM By: Lorine Bears RCP, RRT, CHT Entered By: Lorine Bears on 09/02/2015 11:23:00

## 2015-09-04 NOTE — Progress Notes (Signed)
MELL, THANE ([id number]) Visit Report for 09/03/2015 Arrival Information Details Patient Name: [name], [name] Date of Service: 09/03/2015 10:00 AM Medical Record Patient Account Number: [id number] Number: Treating RN: [birth year] (60 y.o. Other Clinician: Jacqulyn Bath Date of Birth/Sex: Male) Treating Dellia Nims [place] Primary Care Physician: Annye Asa Physician/Extender: G Referring Physician: Letha Cape in Treatment: 1 Visit Information History Since Last Visit Added or deleted any medications: No Patient Arrived: Ambulatory Any new allergies or adverse reactions: No Arrival Time: 09:28 Had a fall or experienced change in No Accompanied By: self activities of daily living that may affect Transfer Assistance: None risk of falls: Patient Identification Verified: Yes Signs or symptoms of abuse/neglect since last No Secondary Verification Process Yes visito Completed: Hospitalized since last visit: No Patient Requires Transmission-Based No Pain Present Now: No Precautions: Patient Has Alerts: No Electronic Signature(s) Signed: 09/03/2015 4:22:03 PM By: Lorine Bears RCP, RRT, CHT Entered By: Lorine Bears on 09/03/2015 09:35:04 [name], [name] ([id number]) -------------------------------------------------------------------------------- Encounter Discharge Information Details Patient Name: [name] Date of Service: 09/03/2015 10:00 AM Medical Record Patient Account Number: [id number] Number: Treating RN: 1955/02/08 (60 y.o. Other Clinician: Jacqulyn Bath Date of Birth/Sex: Male) Treating Dellia Nims [place] Primary Care Physician: Annye Asa Physician/Extender: G Referring Physician: Letha Cape in Treatment: 1 Encounter Discharge Information Items Discharge Pain Level: 0 Discharge Condition: Stable Ambulatory Status: Ambulatory Discharge Destination:  Home Private Transportation: Auto Accompanied By: self Schedule Follow-up Appointment: No Medication Reconciliation completed and No provided to Patient/Care Rihan Schueler: Clinical Summary of Care: Notes Patient has an HBO treatment scheduled on 09/04/15 at 10:00 pm. Electronic Signature(s) Signed: 09/03/2015 4:22:03 PM By: Lorine Bears RCP, RRT, CHT Entered By: Lorine Bears on 09/03/2015 12:10:49 [name], [name] ([id number]) -------------------------------------------------------------------------------- Vitals Details Patient Name: [name] Date of Service: 09/03/2015 10:00 AM Medical Record Patient Account Number: [id number] Number: Treating RN: [birth year] (60 y.o. Other Clinician: Jacqulyn Bath Date of Birth/Sex: Male) Treating ROBSON, [place] Primary Care Physician: Annye Asa Physician/Extender: G Referring Physician: Letha Cape in Treatment: 1 Vital Signs Time Taken: 09:30 Temperature (F): 98.4 Height (in): 65 Pulse (bpm): 78 Weight (lbs): 162 Respiratory Rate (breaths/min): 18 Body Mass Index (BMI): 27 Blood Pressure (mmHg): 140/88 Reference Range: 80 - 120 mg / dl Electronic Signature(s) Signed: 09/03/2015 4:22:03 PM By: Lorine Bears RCP, RRT, CHT Entered By: Lorine Bears on 09/03/2015 09:35:55

## 2015-09-04 NOTE — Progress Notes (Signed)
[name], [name] ([id number]) Visit Report for 09/03/2015 HBO Details Patient Name: [name], [name] Date of Service: 09/03/2015 10:00 AM Medical Record Patient Account Number: [id number] Number: Treating RN: [birth year] (60 y.o. 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 HBO Treatment Details Treatment Number: 3 Patient Type: Outpatient Chamber Type: Monoplace Chamber Serial #: [id number] Treatment Protocol: 2.0 ATA with 90 minutes oxygen, and no air breaks Treatment Details Compression Rate Down: 1.5 psi / minute De-Compression Rate Up: 1.5 psi / minute Air breaks and breathing Compress Tx Pressure periods Decompress Decompress Begins Reached (leave unused spaces Begins Ends blank) Chamber Pressure (ATA) 1 2 - - - - - - 2 1 Clock Time (24 hr) 09:45 09:55 - - - - - - 11:25 11:35 Treatment Length: 110 (minutes) Treatment Segments: 4 Capillary Blood Glucose Pre Capillary Blood Glucose (mg/dl): Post Capillary Blood Glucose (mg/dl): Vital Signs Capillary Blood Glucose Reference Range: 80 - 120 mg / dl HBO Diabetic Blood Glucose Intervention Range: <131 mg/dl or >249 mg/dl Time Vitals Blood Respiratory Capillary Blood Glucose Pulse Action Type: Pulse: Temperature: Taken: Pressure: Rate: Glucose (mg/dl): Meter #: Oximetry (%) Taken: Pre 09:30 140/88 78 18 98.4 Post 11:40 132/82 72 18 98.4 [name], [name] ([id number]) Treatment Response Treatment Completion Status: Treatment Completed without Adverse Event Physician Notes No concerns with treatment given HBO Attestation I certify that I  supervised this HBO treatment in accordance with Medicare guidelines. A trained Yes emergency response team is readily available per hospital policies and procedures. Continue HBOT as ordered. Yes Electronic Signature(s) Signed: 09/03/2015 4:33:17 PM By: Linton Ham MD Entered By: Linton Ham on 09/03/2015 12:43:53 [name], [name] ([id number]) -------------------------------------------------------------------------------- HBO Safety Checklist Details Patient Name: [name] Date of Service: 09/03/2015 10:00 AM Medical Record Patient Account Number: [id number] Number: Treating RN: Aug 20, 1955 (60 y.o. Other Clinician: Jacqulyn Bath Date of Birth/Sex: Male) Treating ROBSON, [place] Primary Care Physician: Annye Asa Physician/Extender: G Referring Physician: Letha Cape in Treatment: 1 HBO Safety Checklist Items Safety Checklist Consent Form Signed Patient voided / foley secured and emptied When did you last eato 09/02/15 Last dose of injectable or oral agent n/a NA Ostomy pouch emptied and vented if applicable NA All implantable devices assessed, documented and approved NA Intravenous access site secured and place Valuables secured Linens and cotton and cotton/polyester blend (less than 51% polyester) Personal oil-based products / skin lotions / body lotions removed NA Wigs or hairpieces removed NA Smoking or tobacco materials removed Books / newspapers / magazines / loose paper removed Cologne, aftershave, perfume and deodorant removed Jewelry removed (may wrap wedding band) NA Make-up removed NA Hair care products removed Battery operated devices (external) removed Heating patches and chemical warmers removed NA Titanium eyewear removed NA Nail polish cured greater than 10 hours NA Casting material cured greater than 10 hours NA Hearing aids removed Loose dentures or partials removed NA Prosthetics have been removed Patient  demonstrates correct use of air break device (if applicable) Patient concerns have been addressed Patient grounding bracelet on and cord attached to chamber Specifics for Inpatients (complete in addition to above) Medication sheet sent with patient [name], [name] ([id number]) Intravenous medications needed or due during therapy sent with patient Drainage tubes (e.g. nasogastric tube or  chest tube secured and vented) Endotracheal or Tracheotomy tube secured Cuff deflated of air and inflated with saline Airway suctioned Electronic Signature(s) Signed: 09/03/2015 4:22:03 PM By: Lorine Bears RCP, RRT, CHT Entered By: Lorine Bears on 09/03/2015 09:37:14

## 2015-09-05 ENCOUNTER — Encounter: Payer: Federal, State, Local not specified - PPO | Attending: Surgery | Admitting: Surgery

## 2015-09-05 DIAGNOSIS — Z8546 Personal history of malignant neoplasm of prostate: Secondary | ICD-10-CM | POA: Insufficient documentation

## 2015-09-05 DIAGNOSIS — N3041 Irradiation cystitis with hematuria: Secondary | ICD-10-CM | POA: Diagnosis not present

## 2015-09-05 DIAGNOSIS — L598 Other specified disorders of the skin and subcutaneous tissue related to radiation: Secondary | ICD-10-CM | POA: Diagnosis not present

## 2015-09-05 DIAGNOSIS — R338 Other retention of urine: Secondary | ICD-10-CM | POA: Diagnosis not present

## 2015-09-05 NOTE — Progress Notes (Signed)
[name], [name] ([id number]) Visit Report for 09/04/2015 HBO Details Patient Name: [name], [name] Date of Service: 09/04/2015 10:00 AM Medical Record Number: [id number] Patient Account Number: [id number] Date of Birth/Sex: 1955-11-21 (60 y.o. 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 HBO Treatment Details Treatment Number: 4 Patient Type: Outpatient Chamber Type: Monoplace Chamber Serial #: [id number] Treatment Protocol: 2.0 ATA with 90 minutes oxygen, and no air breaks Treatment Details Compression Rate Down: 1.5 psi / minute De-Compression Rate Up: 1.5 psi / minute Air breaks and breathing Compress Tx Pressure periods Decompress Decompress Begins Reached (leave unused spaces Begins Ends blank) Chamber Pressure (ATA) 1 2 - - - - - - 2 1 Clock Time (24 hr) 09:54 10:06 - - - - - - 11:36 11:48 Treatment Length: 114 (minutes) Treatment Segments: 4 Capillary Blood Glucose Pre Capillary Blood Glucose (mg/dl): Post Capillary Blood Glucose (mg/dl): Vital Signs Capillary Blood Glucose Reference Range: 80 - 120 mg / dl HBO Diabetic Blood Glucose Intervention Range: <131 mg/dl or >249 mg/dl Time Vitals Blood Respiratory Capillary Blood Glucose Pulse Action Type: Pulse: Temperature: Taken: Pressure: Rate: Glucose (mg/dl): Meter #: Oximetry (%) Taken: Pre 09:35 142/84 78 18 98.4 Post 11:50 148/80 66 18 98.5 Treatment Response Treatment Completion Status: Treatment Completed without Adverse Event [name], [name] ([id number]) Electronic Signature(s) Signed: 09/04/2015 2:46:42 PM By: Lorine Bears RCP,  RRT, CHT Signed: 09/04/2015 4:38:34 PM By: Christin Fudge MD, FACS Previous Signature: 09/04/2015 12:00:42 PM Version By: Christin Fudge MD, FACS Entered By: Lorine Bears on 09/04/2015 12:54:27 [name], [name] ([id number]) -------------------------------------------------------------------------------- HBO Safety Checklist Details Patient Name: [name] Date of Service: 09/04/2015 10:00 AM Medical Record Number: [id number] Patient Account Number: [id number] Date of Birth/Sex: 08-Jun-1955 (60 y.o. Male) Treating RN: Primary Care Physician: Annye Asa Other Clinician: Jacqulyn Bath Referring Physician: Annye Asa Treating Physician/Extender: Frann Rider in Treatment: 1 HBO Safety Checklist Items Safety Checklist Consent Form Signed Patient voided / foley secured and emptied When did you last eato 09/03/15 pm Last dose of injectable or oral agent n/a NA Ostomy pouch emptied and vented if applicable NA All implantable devices assessed, documented and approved NA Intravenous access site secured and place Valuables secured Linens and cotton and cotton/polyester blend (less than 51% polyester) Personal oil-based products / skin lotions / body lotions removed NA Wigs or hairpieces removed NA Smoking or tobacco materials removed Books / newspapers / magazines / loose paper removed Cologne, aftershave, perfume and deodorant removed Jewelry removed (may wrap wedding band) NA Make-up removed NA Hair care products removed Battery operated devices (external) removed Heating patches and chemical warmers removed NA Titanium eyewear removed NA Nail polish cured greater than 10 hours NA Casting material cured greater than 10 hours NA Hearing aids removed Loose dentures or partials removed NA Prosthetics have been removed Patient demonstrates correct use of air break device (if applicable) Patient concerns have been addressed Patient grounding  bracelet on and cord attached to chamber Specifics for Inpatients (complete in addition to above) Medication sheet sent with patient Intravenous medications needed or due during therapy sent with patient [name], [name] ([id number]) Drainage tubes (e.g. nasogastric tube or chest tube secured and vented) Endotracheal or Tracheotomy tube secured Cuff deflated of air and inflated with saline  Airway suctioned Electronic Signature(s) Signed: 09/04/2015 2:46:42 PM By: Lorine Bears RCP, RRT, CHT Entered By: Lorine Bears on 09/04/2015 09:46:07

## 2015-09-06 NOTE — Progress Notes (Signed)
MEKHAI, GAYMON ([id number]) Visit Report for 09/05/2015 Arrival Information Details Patient Name: [name], [name] Date of Service: 09/05/2015 10:00 AM Medical Record Number: [id number] Patient Account Number: [id number] Date of Birth/Sex: 11-29-1955 (60 y.o. Male) Treating RN: Primary Care Physician: Annye Asa Other Clinician: Jacqulyn Bath Referring Physician: Annye Asa Treating Physician/Extender: Frann Rider in Treatment: 1 Visit Information History Since Last Visit Added or deleted any medications: No Patient Arrived: Ambulatory Any new allergies or adverse reactions: No Arrival Time: 09:35 Had a fall or experienced change in No Accompanied By: self activities of daily living that may affect Transfer Assistance: None risk of falls: Patient Identification Verified: Yes Signs or symptoms of abuse/neglect since last No Secondary Verification Process Yes visito Completed: Hospitalized since last visit: No Patient Requires Transmission-Based No Pain Present Now: No Precautions: Patient Has Alerts: No Electronic Signature(s) Signed: 09/05/2015 4:11:18 PM By: Lorine Bears RCP, RRT, CHT Entered By: Lorine Bears on 09/05/2015 09:37:34 Faul, Linton ([id number]) -------------------------------------------------------------------------------- Encounter Discharge Information Details Patient Name: [name] Date of Service: 09/05/2015 10:00 AM Medical Record Number: [id number] Patient Account Number: [id number] Date of Birth/Sex: 05/17/1955 (60 y.o. Male) Treating RN: Primary Care Physician: Annye Asa Other Clinician: Jacqulyn Bath Referring Physician: Annye Asa Treating Physician/Extender: Frann Rider in Treatment: 1 Encounter Discharge Information Items Discharge Pain Level: 0 Discharge Condition: Stable Ambulatory Status: Ambulatory Discharge Destination:  Home Private Transportation: Auto Accompanied By: self Schedule Follow-up Appointment: No Medication Reconciliation completed and No provided to Patient/Care Casin Federici: Clinical Summary of Care: Notes Patient has an HBO treatment scheduled on 09/09/15 at 10:00 am. Electronic Signature(s) Signed: 09/05/2015 4:11:18 PM By: Lorine Bears RCP, RRT, CHT Entered By: Lorine Bears on 09/05/2015 14:07:04 Bachtell, Argelio ([id number]) -------------------------------------------------------------------------------- Vitals Details Patient Name: [name] Date of Service: 09/05/2015 10:00 AM Medical Record Number: [id number] Patient Account Number: [id number] Date of Birth/Sex: [birth year] (60 y.o. Male) Treating RN: Primary Care Physician: Annye Asa Other Clinician: Jacqulyn Bath Referring Physician: Annye Asa Treating Physician/Extender: Frann Rider in Treatment: 1 Vital Signs Time Taken: 09:35 Temperature (F): 98.3 Height (in): 65 Pulse (bpm): 72 Weight (lbs): 162 Respiratory Rate (breaths/min): 18 Body Mass Index (BMI): 27 Blood Pressure (mmHg): 134/86 Reference Range: 80 - 120 mg / dl Electronic Signature(s) Signed: 09/05/2015 4:11:18 PM By: Lorine Bears RCP, RRT, CHT Entered By: Lorine Bears on 09/05/2015 09:38:05

## 2015-09-06 NOTE — Progress Notes (Signed)
ALYUS, BIGGS ([id number]) Visit Report for 09/05/2015 HBO Details Patient Name: FARREL, GIESEL Date of Service: 09/05/2015 10:00 AM Medical Record Number: [id number] Patient Account Number: [id number] Date of Birth/Sex: 1955/03/15 (60 y.o. 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 HBO Treatment Details Treatment Number: 5 Patient Type: Outpatient Chamber Type: Monoplace Chamber Serial #: [id number] Treatment Protocol: 2.0 ATA with 90 minutes oxygen, and no air breaks Treatment Details Compression Rate Down: 1.5 psi / minute De-Compression Rate Up: 1.5 psi / minute Air breaks and breathing Compress Tx Pressure periods Decompress Decompress Begins Reached (leave unused spaces Begins Ends blank) Chamber Pressure (ATA) 1 2 - - - - - - 2 1 Clock Time (24 hr) 09:50 10:02 - - - - - - 11:32 11:43 Treatment Length: 113 (minutes) Treatment Segments: 4 Capillary Blood Glucose Pre Capillary Blood Glucose (mg/dl): Post Capillary Blood Glucose (mg/dl): Vital Signs Capillary Blood Glucose Reference Range: 80 - 120 mg / dl HBO Diabetic Blood Glucose Intervention Range: <131 mg/dl or >249 mg/dl Time Vitals Blood Respiratory Capillary Blood Glucose Pulse Action Type: Pulse: Temperature: Taken: Pressure: Rate: Glucose (mg/dl): Meter #: Oximetry (%) Taken: Pre 09:35 134/86 72 18 98.3 Post 11:45 156/80 60 18 98.4 Treatment Response Treatment Completion Status: Treatment Completed without Adverse Event Layson, Davionne ([id number]) Electronic Signature(s) Signed: 09/05/2015 3:52:23 PM By: Christin Fudge MD, FACS Signed: 09/05/2015  4:11:18 PM By: Lorine Bears RCP, RRT, CHT Previous Signature: 09/05/2015 11:53:18 AM Version By: Christin Fudge MD, FACS Entered By: Lorine Bears on 09/05/2015 13:42:45 Skolnik, Rashi ([id number]) -------------------------------------------------------------------------------- HBO Safety Checklist Details Patient Name: [name] Date of Service: 09/05/2015 10:00 AM Medical Record Number: [id number] Patient Account Number: [id number] Date of Birth/Sex: 08/31/1955 (60 y.o. Male) Treating RN: Primary Care Physician: Annye Asa Other Clinician: Jacqulyn Bath Referring Physician: Annye Asa Treating Physician/Extender: Frann Rider in Treatment: 1 HBO Safety Checklist Items Safety Checklist Consent Form Signed Patient voided / foley secured and emptied When did you last eato 09/04/15 pm Last dose of injectable or oral agent n/a NA Ostomy pouch emptied and vented if applicable NA All implantable devices assessed, documented and approved NA Intravenous access site secured and place Valuables secured Linens and cotton and cotton/polyester blend (less than 51% polyester) Personal oil-based products / skin lotions / body lotions removed NA Wigs or hairpieces removed NA Smoking or tobacco materials removed Books / newspapers / magazines / loose paper removed Cologne, aftershave, perfume and deodorant removed Jewelry removed (may wrap wedding band) NA Make-up removed NA Hair care products removed Battery operated devices (external) removed Heating patches and chemical warmers removed NA Titanium eyewear removed NA Nail polish cured greater than 10 hours NA Casting material cured greater than 10 hours NA Hearing aids removed Loose dentures or partials removed NA Prosthetics have been removed Patient demonstrates correct use of air break device (if applicable) Patient concerns have been addressed Patient grounding bracelet on and  cord attached to chamber Specifics for Inpatients (complete in addition to above) Medication sheet sent with patient Intravenous medications needed or due during therapy sent with patient LINKENHOKER, Rexton ([id number]) Drainage tubes (e.g. nasogastric tube or chest tube secured and vented) Endotracheal or Tracheotomy tube secured Cuff deflated of air and inflated with saline  Airway suctioned Electronic Signature(s) Signed: 09/05/2015 4:11:18 PM By: Lorine Bears RCP, RRT, CHT Entered By: Lorine Bears on 09/05/2015 09:38:59

## 2015-09-09 ENCOUNTER — Encounter: Payer: Federal, State, Local not specified - PPO | Admitting: Internal Medicine

## 2015-09-09 DIAGNOSIS — R338 Other retention of urine: Secondary | ICD-10-CM | POA: Diagnosis not present

## 2015-09-09 DIAGNOSIS — L598 Other specified disorders of the skin and subcutaneous tissue related to radiation: Secondary | ICD-10-CM | POA: Diagnosis not present

## 2015-09-09 DIAGNOSIS — Z8546 Personal history of malignant neoplasm of prostate: Secondary | ICD-10-CM | POA: Diagnosis not present

## 2015-09-09 DIAGNOSIS — N3041 Irradiation cystitis with hematuria: Secondary | ICD-10-CM | POA: Diagnosis not present

## 2015-09-09 NOTE — Progress Notes (Signed)
JEREMIAH, HASENAUER ([id number]) Visit Report for 09/09/2015 Arrival Information Details Patient Name: [name], [name] Date of Service: 09/09/2015 10:00 AM Medical Record Patient Account Number: [id number] Number: Treating RN: [birth year] (60 y.o. Other Clinician: Date of Birth/Sex: Male) Treating ROBSON, [name] Primary Care Physician: Annye Asa Physician/Extender: G Referring Physician: Letha Cape in Treatment: 2 Visit Information History Since Last Visit Added or deleted any medications: No Patient Arrived: Ambulatory Any new allergies or adverse reactions: No Arrival Time: 09:40 Had a fall or experienced change in No Accompanied By: self activities of daily living that may affect Transfer Assistance: None risk of falls: Patient Requires Transmission-Based No Signs or symptoms of abuse/neglect since last No Precautions: visito Patient Has Alerts: No Hospitalized since last visit: No Pain Present Now: No Electronic Signature(s) Signed: 09/09/2015 4:32:22 PM By: Lorine Bears RCP, RRT, CHT Entered By: Lorine Bears on 09/09/2015 11:44:11 [place], Jaksen ([id number]) -------------------------------------------------------------------------------- Encounter Discharge Information Details Patient Name: [name] Date of Service: 09/09/2015 10:00 AM Medical Record Patient Account Number: [id number] Number: Treating RN: Jun 01, 1955 (60 y.o. Other Clinician: Date of Birth/Sex: Male) Treating ROBSON, [place] Primary Care Physician: Annye Asa Physician/Extender: G Referring Physician: Letha Cape in Treatment: 2 Encounter Discharge Information Items Discharge Pain Level: 0 Discharge Condition: Stable Ambulatory Status: Ambulatory Discharge Destination: Home Private Transportation: Auto Accompanied By: self Schedule Follow-up Appointment: No Medication Reconciliation completed  and No provided to Patient/Care Tifanny Dollens: Clinical Summary of Care: Notes Patient has an HBO treatment scheduled on 09/10/15 at 10:00 am. Electronic Signature(s) Signed: 09/09/2015 4:32:22 PM By: Lorine Bears RCP, RRT, CHT Entered By: Lorine Bears on 09/09/2015 12:02:27 [name] ([id number]) -------------------------------------------------------------------------------- Vitals Details Patient Name: [name] Date of Service: 09/09/2015 10:00 AM Medical Record Patient Account Number: [id number] Number: Treating RN: January 20, 1955 (60 y.o. Other Clinician: Date of Birth/Sex: Male) Treating ROBSON, [place] Primary Care Physician: Annye Asa Physician/Extender: G Referring Physician: Letha Cape in Treatment: 2 Vital Signs Time Taken: 09:40 Temperature (F): 98.5 Height (in): 65 Pulse (bpm): 78 Weight (lbs): 162 Respiratory Rate (breaths/min): 18 Body Mass Index (BMI): 27 Blood Pressure (mmHg): 134/92 Reference Range: 80 - 120 mg / dl Electronic Signature(s) Signed: 09/09/2015 4:32:22 PM By: Lorine Bears RCP, RRT, CHT Entered By: Lorine Bears on 09/09/2015 11:44:43

## 2015-09-10 ENCOUNTER — Encounter: Payer: Federal, State, Local not specified - PPO | Admitting: Internal Medicine

## 2015-09-10 DIAGNOSIS — N3041 Irradiation cystitis with hematuria: Secondary | ICD-10-CM | POA: Diagnosis not present

## 2015-09-10 DIAGNOSIS — R338 Other retention of urine: Secondary | ICD-10-CM | POA: Diagnosis not present

## 2015-09-10 DIAGNOSIS — L598 Other specified disorders of the skin and subcutaneous tissue related to radiation: Secondary | ICD-10-CM | POA: Diagnosis not present

## 2015-09-10 DIAGNOSIS — Z8546 Personal history of malignant neoplasm of prostate: Secondary | ICD-10-CM | POA: Diagnosis not present

## 2015-09-10 NOTE — Progress Notes (Signed)
SEAB, RHAME ([id number]) Visit Report for 09/09/2015 HBO Details Patient Name: [name], [name] Date of Service: 09/09/2015 10:00 AM Medical Record Patient Account Number: [id number] Number: Treating RN: 07-27-1955 (60 y.o. 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 HBO Treatment Details Treatment Number: 6 Patient Type: Outpatient Chamber Type: Monoplace Chamber Serial #: [id number] Treatment Protocol: 2.0 ATA with 90 minutes oxygen, and no air breaks Treatment Details Compression Rate Down: 1.5 psi / minute De-Compression Rate Up: 1.5 psi / minute Air breaks and breathing Compress Tx Pressure periods Decompress Decompress Begins Reached (leave unused spaces Begins Ends blank) Chamber Pressure (ATA) 1 2 - - - - - - 2 1 Clock Time (24 hr) 09:54 10:06 - - - - - - 11:36 11:47 Treatment Length: 113 (minutes) Treatment Segments: 4 Capillary Blood Glucose Pre Capillary Blood Glucose (mg/dl): Post Capillary Blood Glucose (mg/dl): Vital Signs Capillary Blood Glucose Reference Range: 80 - 120 mg / dl HBO Diabetic Blood Glucose Intervention Range: <131 mg/dl or >249 mg/dl Time Vitals Blood Respiratory Capillary Blood Glucose Pulse Action Type: Pulse: Temperature: Taken: Pressure: Rate: Glucose (mg/dl): Meter #: Oximetry (%) Taken: Pre 09:40 134/92 78 18 98.5 Post 11:50 136/88 60 18 98.3 [name] ([id number]) Treatment Response Treatment Completion Status: Treatment Completed without Adverse Event Physician Notes No concerns with treatment given HBO Attestation I certify that I supervised this HBO  treatment in accordance with Medicare guidelines. A trained Yes emergency response team is readily available per hospital policies and procedures. Continue HBOT as ordered. Yes Electronic Signature(s) Signed: 09/10/2015 7:55:14 AM By: Linton Ham MD Entered By: Linton Ham on 09/09/2015 15:29:47 Sachit, Franchina Neco ([id number]) -------------------------------------------------------------------------------- HBO Safety Checklist Details Patient Name: [name] Date of Service: 09/09/2015 10:00 AM Medical Record Patient Account Number: [id number] Number: Treating RN: 1955/01/31 (60 y.o. Other Clinician: Date of Birth/Sex: Male) Treating ROBSON, [place] Primary Care Physician: Annye Asa Physician/Extender: G Referring Physician: Letha Cape in Treatment: 2 HBO Safety Checklist Items Safety Checklist Consent Form Signed Patient voided / foley secured and emptied When did you last eato 09/08/15 pm Last dose of injectable or oral agent n/a NA Ostomy pouch emptied and vented if applicable NA All implantable devices assessed, documented and approved NA Intravenous access site secured and place Valuables secured Linens and cotton and cotton/polyester blend (less than 51% polyester) Personal oil-based products / skin lotions / body lotions removed NA Wigs or hairpieces removed NA Smoking or tobacco materials removed Books / newspapers / magazines / loose paper removed Cologne, aftershave, perfume and deodorant removed Jewelry removed (may wrap wedding band) NA Make-up removed NA Hair care products removed Battery operated devices (external) removed Heating patches and chemical warmers removed NA Titanium eyewear removed NA Nail polish cured greater than 10 hours NA Casting material cured greater than 10 hours NA Hearing aids removed Loose dentures or partials removed NA Prosthetics have been removed Patient demonstrates correct use of air  break device (if applicable) Patient concerns have been addressed Patient grounding bracelet on and cord attached to chamber Specifics for Inpatients (complete in addition to above) Medication sheet sent with patient PRENDES, [name] ([id number]) Intravenous medications needed or due during therapy sent with patient Drainage tubes (e.g. nasogastric tube or chest tube secured  and vented) Endotracheal or Tracheotomy tube secured Cuff deflated of air and inflated with saline Airway suctioned Electronic Signature(s) Signed: 09/09/2015 4:32:22 PM By: Lorine Bears RCP, RRT, CHT Entered By: Lorine Bears on 09/09/2015 11:45:50

## 2015-09-11 ENCOUNTER — Encounter: Payer: Federal, State, Local not specified - PPO | Admitting: Surgery

## 2015-09-11 DIAGNOSIS — N3041 Irradiation cystitis with hematuria: Secondary | ICD-10-CM | POA: Diagnosis not present

## 2015-09-11 DIAGNOSIS — L598 Other specified disorders of the skin and subcutaneous tissue related to radiation: Secondary | ICD-10-CM | POA: Diagnosis not present

## 2015-09-11 DIAGNOSIS — Z8546 Personal history of malignant neoplasm of prostate: Secondary | ICD-10-CM | POA: Diagnosis not present

## 2015-09-11 DIAGNOSIS — R338 Other retention of urine: Secondary | ICD-10-CM | POA: Diagnosis not present

## 2015-09-11 NOTE — Progress Notes (Signed)
[name], [name] ([id number]) Visit Report for 09/10/2015 HBO Details Patient Name: [name], [name] Date of Service: 09/10/2015 10:00 AM Medical Record Patient Account Number: [id number] Number: Treating RN: [birth year] (60 y.o. 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 HBO Treatment Details Treatment Number: 7 Patient Type: Outpatient Chamber Type: Monoplace Chamber Serial #: [id number] Treatment Protocol: 2.0 ATA with 90 minutes oxygen, and no air breaks Treatment Details Compression Rate Down: 1.5 psi / minute De-Compression Rate Up: 1.5 psi / minute Air breaks and breathing Compress Tx Pressure periods Decompress Decompress Begins Reached (leave unused spaces Begins Ends blank) Chamber Pressure (ATA) 1 2 - - - - - - 2 1 Clock Time (24 hr) 09:48 09:59 - - - - - - 11:30 11:40 Treatment Length: 112 (minutes) Treatment Segments: 4 Capillary Blood Glucose Pre Capillary Blood Glucose (mg/dl): Post Capillary Blood Glucose (mg/dl): Vital Signs Capillary Blood Glucose Reference Range: 80 - 120 mg / dl HBO Diabetic Blood Glucose Intervention Range: <131 mg/dl or >249 mg/dl Time Vitals Blood Respiratory Capillary Blood Glucose Pulse Action Type: Pulse: Temperature: Taken: Pressure: Rate: Glucose (mg/dl): Meter #: Oximetry (%) Taken: Pre 09:35 146/80 78 18 98.5 Post 11:45 130/86 72 18 98.2 [name], [name] ([id number]) Treatment Response Treatment Completion Status: Treatment Completed without Adverse Event Physician Notes No concerns with treatment given HBO Attestation I certify that I  supervised this HBO treatment in accordance with Medicare guidelines. A trained Yes emergency response team is readily available per hospital policies and procedures. Continue HBOT as ordered. Yes Electronic Signature(s) Signed: 09/11/2015 12:55:33 PM By: Linton Ham MD Previous Signature: 09/10/2015 4:39:47 PM Version By: Lorine Bears RCP, RRT, CHT Entered By: Linton Ham on 09/10/2015 16:43:39 [name], [name] ([id number]) -------------------------------------------------------------------------------- HBO Safety Checklist Details Patient Name: [name] Date of Service: 09/10/2015 10:00 AM Medical Record Patient Account Number: [id number] Number: Treating RN: [birth year] (60 y.o. Other Clinician: Jacqulyn Bath Date of Birth/Sex: Male) Treating [place], [place] Primary Care Physician: Annye Asa Physician/Extender: G Referring Physician: Letha Cape in Treatment: 2 HBO Safety Checklist Items Safety Checklist Consent Form Signed Patient voided / foley secured and emptied When did you last eato 09/09/15 pm Last dose of injectable or oral agent n/a NA Ostomy pouch emptied and vented if applicable NA All implantable devices assessed, documented and approved NA Intravenous access site secured and place Valuables secured Linens and cotton and cotton/polyester blend (less than 51% polyester) Personal oil-based products / skin lotions / body lotions removed NA Wigs or hairpieces removed NA Smoking or tobacco materials removed Books / newspapers / magazines / loose paper removed Cologne, aftershave, perfume and deodorant removed Jewelry removed (may wrap wedding band) NA Make-up removed NA Hair care products removed Battery operated devices (external) removed Heating patches and chemical warmers removed NA Titanium eyewear removed NA Nail polish cured greater than 10 hours NA Casting material cured greater than 10 hours NA  Hearing aids removed Loose dentures or partials removed NA Prosthetics have been removed Patient demonstrates correct use of air break device (if applicable) Patient concerns have been addressed Patient grounding bracelet on and cord attached to chamber Specifics for Inpatients (complete in addition to above) Medication sheet sent with patient [name], [name] ([id number]) Intravenous medications  needed or due during therapy sent with patient Drainage tubes (e.g. nasogastric tube or chest tube secured and vented) Endotracheal or Tracheotomy tube secured Cuff deflated of air and inflated with saline Airway suctioned Electronic Signature(s) Signed: 09/10/2015 4:39:47 PM By: Lorine Bears RCP, RRT, CHT Entered By: Lorine Bears on 09/10/2015 10:11:55

## 2015-09-11 NOTE — Progress Notes (Signed)
[name] ([id number]) Visit Report for 09/10/2015 Arrival Information Details Patient Name: [name], [name] Date of Service: 09/10/2015 10:00 AM Medical Record Patient Account Number: [id number] Number: Treating RN: Dec 21, 1955 (60 y.o. Other Clinician: Jacqulyn Bath Date of Birth/Sex: Male) Treating Dellia Nims [place] Primary Care Physician: Annye Asa Physician/Extender: G Referring Physician: Letha Cape in Treatment: 2 Visit Information History Since Last Visit Added or deleted any medications: No Patient Arrived: Ambulatory Any new allergies or adverse reactions: No Arrival Time: 09:35 Had a fall or experienced change in No Accompanied By: self activities of daily living that may affect Transfer Assistance: None risk of falls: Patient Identification Verified: Yes Signs or symptoms of abuse/neglect since last No Secondary Verification Process Yes visito Completed: Hospitalized since last visit: No Patient Requires Transmission-Based No Pain Present Now: No Precautions: Patient Has Alerts: No Electronic Signature(s) Signed: 09/10/2015 4:39:47 PM By: Lorine Bears RCP, RRT, CHT Entered By: Lorine Bears on 09/10/2015 10:10:10 [name], [name] ([id number]) -------------------------------------------------------------------------------- Encounter Discharge Information Details Patient Name: [name] Date of Service: 09/10/2015 10:00 AM Medical Record Patient Account Number: [id number] Number: Treating RN: [birth year] (60 y.o. Other Clinician: Jacqulyn Bath Date of Birth/Sex: Male) Treating Dellia Nims [place] Primary Care Physician: Annye Asa Physician/Extender: G Referring Physician: Letha Cape in Treatment: 2 Encounter Discharge Information Items Discharge Pain Level: 0 Discharge Condition: Stable Ambulatory Status: Ambulatory Discharge Destination:  Home Private Transportation: Auto Accompanied By: self Schedule Follow-up Appointment: No Medication Reconciliation completed and No provided to Patient/Care [name]: Clinical Summary of Care: Notes Patient has an HBO treatment scheduled on 09/12/15 at 08:00 am. Electronic Signature(s) Signed: 09/10/2015 4:39:47 PM By: Lorine Bears RCP, RRT, CHT Entered By: Lorine Bears on 09/10/2015 13:59:48 [name], [name] ([id number]) -------------------------------------------------------------------------------- [place] Details Patient Name: [name] Date of Service: 09/10/2015 10:00 AM Medical Record Patient Account Number: [id number] Number: Treating RN: Dec 06, 1955 (60 y.o. Other Clinician: Jacqulyn Bath Date of Birth/Sex: Male) Treating [name], [name] Primary Care Physician: Annye Asa Physician/Extender: G Referring Physician: Letha Cape in Treatment: 2 Vital Signs Time Taken: 09:35 Temperature (F): 98.5 Height (in): 65 Pulse (bpm): 78 Weight (lbs): 162 Respiratory Rate (breaths/min): 18 Body Mass Index (BMI): 27 Blood Pressure (mmHg): 146/80 Reference Range: 80 - 120 mg / dl Electronic Signature(s) Signed: 09/10/2015 4:39:47 PM By: Lorine Bears RCP, RRT, CHT Entered By: Lorine Bears on 09/10/2015 10:10:49

## 2015-09-12 ENCOUNTER — Encounter: Payer: Federal, State, Local not specified - PPO | Admitting: Surgery

## 2015-09-12 DIAGNOSIS — Z8546 Personal history of malignant neoplasm of prostate: Secondary | ICD-10-CM | POA: Diagnosis not present

## 2015-09-12 DIAGNOSIS — R338 Other retention of urine: Secondary | ICD-10-CM | POA: Diagnosis not present

## 2015-09-12 DIAGNOSIS — N3041 Irradiation cystitis with hematuria: Secondary | ICD-10-CM | POA: Diagnosis not present

## 2015-09-12 DIAGNOSIS — L598 Other specified disorders of the skin and subcutaneous tissue related to radiation: Secondary | ICD-10-CM | POA: Diagnosis not present

## 2015-09-12 NOTE — Progress Notes (Addendum)
[name], [name] ([id number]) Visit Report for 09/12/2015 Arrival Information Details Patient Name: [name], [name] Date of Service: 09/12/2015 8:00 AM Medical Record Number: [id number] Patient Account Number: [id number] Date of Birth/Sex: [birth year] (60 y.o. Male) Treating RN: Cornell Barman Primary Care Physician: Annye Asa Other Clinician: Jacqulyn Bath Referring Physician: Annye Asa Treating Physician/Extender: Frann Rider in Treatment: 2 Visit Information History Since Last Visit Added or deleted any medications: No Patient Arrived: Ambulatory Any new allergies or adverse reactions: No Arrival Time: 07:45 Had a fall or experienced change in No Accompanied By: self activities of daily living that may affect Transfer Assistance: None risk of falls: Patient Identification Verified: Yes Signs or symptoms of abuse/neglect since last No Secondary Verification Process Yes visito Completed: Hospitalized since last visit: No Patient Requires Transmission-Based No Pain Present Now: No Precautions: Patient Has Alerts: No Electronic Signature(s) Signed: 09/12/2015 8:03:45 AM By: Gretta Cool, RN, BSN, Kim RN, BSN Entered By: Gretta Cool, RN, BSN, Kim on 09/12/2015 08:03:45 [name] ([id number]) -------------------------------------------------------------------------------- Encounter Discharge Information Details Patient Name: [name] Date of Service: 09/12/2015 8:00 AM Medical Record Number: [id number] Patient Account Number: [id number] Date of Birth/Sex: 04-Sep-1955 (60 y.o. Male) Treating RN: Cornell Barman Primary Care Physician: Annye Asa Other Clinician: Jacqulyn Bath Referring Physician: Annye Asa Treating Physician/Extender: Frann Rider in Treatment: 2 Encounter Discharge Information Items Discharge Pain Level: 0 Discharge Condition: Stable Ambulatory Status: Ambulatory Discharge Destination:  Home Private Transportation: Auto Accompanied By: self Schedule Follow-up Appointment: Yes Medication Reconciliation completed and Yes provided to Patient/Care Jayliah Benett: Clinical Summary of Care: Electronic Signature(s) Signed: 09/12/2015 10:13:59 AM By: Gretta Cool, RN, BSN, Kim RN, BSN Entered By: Gretta Cool, RN, BSN, Kim on 09/12/2015 10:13:58 [name] ([id number]) -------------------------------------------------------------------------------- Patient/Caregiver Education Details Patient Name: [name] Date of Service: 09/12/2015 8:00 AM Medical Record Number: [id number] Patient Account Number: [id number] Date of Birth/Gender: January 14, 1955 (60 y.o. Male) Treating RN: Cornell Barman Primary Care Physician: Annye Asa Other Clinician: Jacqulyn Bath Referring Physician: Annye Asa Treating Physician/Extender: Frann Rider in Treatment: 2 Education Assessment Education Provided To: Patient Education Topics Provided Hyperbaric Oxygenation: Handouts: Hyperbaric Oxygen Methods: Explain/Verbal Responses: State content correctly Electronic Signature(s) Signed: 09/15/2015 12:18:12 PM By: Gretta Cool, RN, BSN, Kim RN, BSN Entered By: Gretta Cool, RN, BSN, Kim on 09/12/2015 10:13:44 [name] ([id number]) -------------------------------------------------------------------------------- [place] Details Patient Name: [name] Date of Service: 09/12/2015 8:00 AM Medical Record Number: [id number] Patient Account Number: [id number] Date of Birth/Sex: 1955/11/25 (60 y.o. Male) Treating RN: Cornell Barman Primary Care Physician: Annye Asa Other Clinician: Jacqulyn Bath Referring Physician: Annye Asa Treating Physician/Extender: Frann Rider in Treatment: 2 Vital Signs Time Taken: 07:55 Temperature (F): 98.2 Height (in): 65 Pulse (bpm): 80 Weight (lbs): 162 Respiratory Rate (breaths/min): 16 Body Mass Index (BMI): 27 Blood Pressure (mmHg):  142/82 Reference Range: 80 - 120 mg / dl Electronic Signature(s) Signed: 09/12/2015 8:04:07 AM By: Gretta Cool, RN, BSN, Kim RN, BSN Entered By: Gretta Cool, RN, BSN, Kim on 09/12/2015 08:04:07

## 2015-09-12 NOTE — Progress Notes (Signed)
TREVONTE, TERRASI ([id number]) Visit Report for 09/12/2015 Problem List Details Patient Name: [name], [name] Date of Service: 09/12/2015 8:00 AM Medical Record Number: [id number] Patient Account Number: [id number] Date of Birth/Sex: [birth year] (60 y.o. 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 R33.8 Other retention of urine Z85.46 Personal history of malignant neoplasm of prostate Facility Procedures CPT4 Code: [id number] Description: (Facility Use Only) HBOT, full body chamber, 38min Modifier: Quantity: 4 Physician Procedures CPT4: Description  Modifier Quantity Code [id number] - WC PHYS HYPERBARIC OXYGEN THERAPY 1 ICD-10 Description Diagnosis 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

## 2015-09-12 NOTE — Progress Notes (Signed)
DEAUNTAE, BERNHEISEL ([id number]) Visit Report for 09/11/2015 Arrival Information Details Patient Name: [name] Date of Service: 09/11/2015 10:00 AM Medical Record Number: [id number] Patient Account Number: [id number] Date of Birth/Sex: Sep 02, 1955 (60 y.o. Male) Treating RN: Primary Care Physician: Annye Asa Other Clinician: Jacqulyn Bath Referring Physician: Annye Asa Treating Physician/Extender: Frann Rider in Treatment: 2 Visit Information History Since Last Visit Added or deleted any medications: No Patient Arrived: Ambulatory Any new allergies or adverse reactions: No Arrival Time: 09:33 Had a fall or experienced change in No Accompanied By: self activities of daily living that may affect Transfer Assistance: None risk of falls: Patient Identification Verified: Yes Signs or symptoms of abuse/neglect since last No Secondary Verification Process Yes visito Completed: Hospitalized since last visit: No Patient Requires Transmission-Based No Pain Present Now: No Precautions: Patient Has Alerts: No Electronic Signature(s) Signed: 09/11/2015 3:03:24 PM By: Lorine Bears RCP, RRT, CHT Entered By: Becky Sax, Amado Nash on 09/11/2015 09:45:36 [name], [name] ([id number]) -------------------------------------------------------------------------------- Encounter Discharge Information Details Patient Name: [name] Date of Service: 09/11/2015 10:00 AM Medical Record Number: [id number] Patient Account Number: [id number] Date of Birth/Sex: 1955/05/25 (60 y.o. Male) Treating RN: Primary Care Physician: Annye Asa Other Clinician: Jacqulyn Bath Referring Physician: Annye Asa Treating Physician/Extender: Frann Rider in Treatment: 2 Encounter Discharge Information Items Discharge Pain Level: 0 Discharge Condition: Stable Ambulatory Status: Ambulatory Discharge Destination:  Home Private Transportation: Auto Accompanied By: self Schedule Follow-up Appointment: No Medication Reconciliation completed and No provided to Patient/Care [name]: Clinical Summary of Care: Notes Patient has an HBO treatment scheduled on 09/12/15 at 08:00 am. Electronic Signature(s) Signed: 09/11/2015 3:03:24 PM By: Lorine Bears RCP, RRT, CHT Entered By: Lorine Bears on 09/11/2015 12:03:18 [place], [name] ([id number]) -------------------------------------------------------------------------------- [place] Details Patient Name: [name] Date of Service: 09/11/2015 10:00 AM Medical Record Number: [id number] Patient Account Number: [id number] Date of Birth/Sex: Jan 16, 1955 (60 y.o. Male) Treating RN: Primary Care Physician: Annye Asa Other Clinician: Jacqulyn Bath Referring Physician: Annye Asa Treating Physician/Extender: Frann Rider in Treatment: 2 Vital Signs Time Taken: 09:35 Temperature (F): 98.3 Height (in): 65 Pulse (bpm): 78 Weight (lbs): 162 Respiratory Rate (breaths/min): 18 Body Mass Index (BMI): 27 Blood Pressure (mmHg): 130/80 Reference Range: 80 - 120 mg / dl Electronic Signature(s) Signed: 09/11/2015 3:03:24 PM By: Lorine Bears RCP, RRT, CHT Entered By: Lorine Bears on 09/11/2015 09:46:05

## 2015-09-12 NOTE — Progress Notes (Signed)
GRACIE, RELAFORD ([id number]) Visit Report for 09/12/2015 HBO Details Patient Name: [name], [name] Date of Service: 09/12/2015 8:00 AM Medical Record Number: [id number] Patient Account Number: [id number] Date of Birth/Sex: 1955-08-26 (60 y.o. 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 HBO Treatment Details Treatment Number: 9 Patient Type: Outpatient Chamber Type: Monoplace Chamber Serial #: [id number] Treatment Protocol: 2.0 ATA with 90 minutes oxygen, and no air breaks Treatment Details Compression Rate Down: 1.5 psi / minute De-Compression Rate Up: 1.5 psi / minute Air breaks and breathing Compress Tx Pressure periods Decompress Decompress Begins Reached (leave unused spaces Begins Ends blank) Chamber Pressure (ATA) 1 2 - - - - - - 2 1 Clock Time (24 hr) 08:12 08:23 - - - - - - 09:54 10:07 Treatment Length: 115 (minutes) Treatment Segments: 4 Capillary Blood Glucose Pre Capillary Blood Glucose (mg/dl): Post Capillary Blood Glucose (mg/dl): Vital Signs Capillary Blood Glucose Reference Range: 80 - 120 mg / dl HBO Diabetic Blood Glucose Intervention Range: <131 mg/dl or >249 mg/dl Time Vitals Blood Respiratory Capillary Blood Glucose Pulse Action Type: Pulse: Temperature: Taken: Pressure: Rate: Glucose (mg/dl): Meter #: Oximetry (%) Taken: Pre 07:55 142/82 80 16 98.2 Post 10:10 168/88 80 16 98.2 Treatment Response Treatment Completion Status: Treatment Completed without Adverse Event [name], [name] ([id number]) HBO Attestation I certify that I supervised this HBO treatment in accordance with  Medicare guidelines. A trained Yes emergency response team is readily available per hospital policies and procedures. Continue HBOT as ordered. Yes Electronic Signature(s) Signed: 09/12/2015 10:48:10 AM By: Christin Fudge MD, FACS Previous Signature: 09/12/2015 10:13:20 AM Version By: Gretta Cool RN, BSN, Kim RN, BSN Previous Signature: 09/12/2015 10:12:14 AM Version By: Gretta Cool RN, BSN, Kim RN, BSN Previous Signature: 09/12/2015 10:11:20 AM Version By: Gretta Cool RN, BSN, Kim RN, BSN Previous Signature: 09/12/2015 9:54:41 AM Version By: Gretta Cool RN, BSN, Kim RN, BSN Previous Signature: 09/12/2015 8:31:43 AM Version By: Gretta Cool, RN, BSN, Kim RN, BSN Previous Signature: 09/12/2015 8:12:26 AM Version By: Gretta Cool, RN, BSN, Kim RN, BSN Entered By: Christin Fudge on 09/12/2015 10:48:09 [name], [name] ([id number]) -------------------------------------------------------------------------------- HBO Safety Checklist Details Patient Name: [name] Date of Service: 09/12/2015 8:00 AM Medical Record Number: [id number] Patient Account Number: [id number] Date of Birth/Sex: Apr 14, 1955 (60 y.o. Male) Treating RN: Cornell Barman Primary Care Physician: Annye Asa Other Clinician: Jacqulyn Bath Referring Physician: Annye Asa Treating Physician/Extender: Frann Rider in Treatment: 2 HBO Safety Checklist Items Safety Checklist Consent Form Signed NA Patient voided / foley secured and emptied When did you last eato 0700 Last dose of injectable or oral agent NA NA Ostomy pouch emptied and vented if applicable NA All implantable devices assessed, documented and approved NA Intravenous access site secured and place NA Valuables secured Linens and cotton and cotton/polyester blend (less than 51% polyester) NA Personal oil-based products / skin lotions / body lotions removed NA Wigs or hairpieces removed Smoking or tobacco materials removed Books / newspapers / magazines / loose paper removed Cologne,  aftershave, perfume and deodorant removed Jewelry removed (may wrap wedding band) NA Make-up removed NA Hair care products removed NA Battery operated devices (external) removed Heating patches and chemical warmers removed NA Titanium eyewear removed NA Nail polish cured greater than 10 hours NA  Casting material cured greater than 10 hours NA Hearing aids removed NA Loose dentures or partials removed NA Prosthetics have been removed Patient demonstrates correct use of air break device (if applicable) Patient concerns have been addressed Patient grounding bracelet on and cord attached to chamber Specifics for Inpatients (complete in addition to above) Medication sheet sent with patient Intravenous medications needed or due during therapy sent with patient [name], [name] ([id number]) Drainage tubes (e.g. nasogastric tube or chest tube secured and vented) Endotracheal or Tracheotomy tube secured Cuff deflated of air and inflated with saline Airway suctioned Electronic Signature(s) Signed: 09/12/2015 8:12:06 AM By: Gretta Cool, RN, BSN, Kim RN, BSN Entered By: Gretta Cool, RN, BSN, Kim on 09/12/2015 08:12:06

## 2015-09-12 NOTE — Progress Notes (Signed)
[name], [name] ([id number]) Visit Report for 09/11/2015 HBO Details Patient Name: [name] [name], [name] [name] Date of Service: 09/11/2015 10:00 AM Medical Record Number: [id number] Patient Account Number: [id number] Date of Birth/Sex: [birth year] (60 y.o. 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 HBO Treatment Details Treatment Number: 8 Patient Type: Outpatient Chamber Type: Monoplace Chamber Serial #: [id number] Treatment Protocol: 2.0 ATA with 90 minutes oxygen, and no air breaks Treatment Details Compression Rate Down: 1.5 psi / minute De-Compression Rate Up: 1.5 psi / minute Air breaks and breathing Compress Tx Pressure periods Decompress Decompress Begins Reached (leave unused spaces Begins Ends blank) Chamber Pressure (ATA) 1 2 - - - - - - 2 1 Clock Time (24 hr) 09:52 10:04 - - - - - - 11:34 11:45 Treatment Length: 113 (minutes) Treatment Segments: 4 Capillary Blood Glucose Pre Capillary Blood Glucose (mg/dl): Post Capillary Blood Glucose (mg/dl): Vital Signs Capillary Blood Glucose Reference Range: 80 - 120 mg / dl HBO Diabetic Blood Glucose Intervention Range: <131 mg/dl or >249 mg/dl Time Vitals Blood Respiratory Capillary Blood Glucose Pulse Action Type: Pulse: Temperature: Taken: Pressure: Rate: Glucose (mg/dl): Meter #: Oximetry (%) Taken: Pre 09:35 130/80 78 18 98.3 Post 11:50 148/90 72 18 98.5 Treatment Response Treatment Completion Status: Treatment Completed without Adverse Event [name] [name] ([id number]) HBO Attestation I certify that I supervised this HBO treatment in accordance with Medicare  guidelines. A trained Yes emergency response team is readily available per hospital policies and procedures. Continue HBOT as ordered. Yes Electronic Signature(s) Signed: 09/11/2015 1:00:46 PM By: Christin Fudge MD, FACS Entered By: Christin Fudge on 09/11/2015 13:00:46 [name] [name] ([id number]) -------------------------------------------------------------------------------- HBO Safety Checklist Details Patient Name: [name] [name] Date of Service: 09/11/2015 10:00 AM Medical Record Number: [id number] Patient Account Number: [id number] Date of Birth/Sex: 1955/04/28 (60 y.o. Male) Treating RN: Primary Care Physician: Annye Asa Other Clinician: Jacqulyn Bath Referring Physician: Annye Asa Treating Physician/Extender: Frann Rider in Treatment: 2 HBO Safety Checklist Items Safety Checklist Consent Form Signed Patient voided / foley secured and emptied When did you last eato 09/10/15 pm Last dose of injectable or oral agent n/a NA Ostomy pouch emptied and vented if applicable NA All implantable devices assessed, documented and approved NA Intravenous access site secured and place Valuables secured Linens and cotton and cotton/polyester blend (less than 51% polyester) Personal oil-based products / skin lotions / body lotions removed NA Wigs or hairpieces removed NA Smoking or tobacco materials removed Books / newspapers / magazines / loose paper removed Cologne, aftershave, perfume and deodorant removed Jewelry removed (may wrap wedding band) NA Make-up removed NA Hair care products removed Battery operated devices (external) removed Heating patches and chemical warmers removed NA Titanium eyewear removed NA Nail polish cured greater than 10 hours NA Casting material cured greater than 10 hours NA Hearing aids removed Loose dentures or partials removed NA Prosthetics have been removed Patient demonstrates correct use of air break device (if  applicable) Patient concerns have been addressed Patient grounding bracelet on and cord attached to chamber Specifics for Inpatients (complete in addition to above) Medication sheet sent with patient Intravenous medications needed or due during therapy sent with patient [name] [name] ([id number]) Drainage tubes (e.g. nasogastric tube or chest tube secured and vented) Endotracheal or  Tracheotomy tube secured Cuff deflated of air and inflated with saline Airway suctioned Electronic Signature(s) Signed: 09/11/2015 3:03:24 PM By: Lorine Bears RCP, RRT, CHT Entered By: Lorine Bears on 09/11/2015 09:47:05

## 2015-09-14 ENCOUNTER — Emergency Department: Payer: Federal, State, Local not specified - PPO | Admitting: Anesthesiology

## 2015-09-14 ENCOUNTER — Encounter: Payer: Self-pay | Admitting: Emergency Medicine

## 2015-09-14 ENCOUNTER — Encounter
Admission: EM | Disposition: A | Discharge status: Home / Self Care | Payer: Self-pay | Source: Home / Self Care | Attending: Emergency Medicine

## 2015-09-14 ENCOUNTER — Emergency Department
Admission: EM | Admit: 2015-09-14 | Discharge: 2015-09-14 | Disposition: A | Discharge status: Home / Self Care | Payer: Federal, State, Local not specified - PPO | Attending: Emergency Medicine | Admitting: Emergency Medicine

## 2015-09-14 DIAGNOSIS — Z8042 Family history of malignant neoplasm of prostate: Secondary | ICD-10-CM | POA: Diagnosis not present

## 2015-09-14 DIAGNOSIS — Y842 Radiological procedure and radiotherapy as the cause of abnormal reaction of the patient, or of later complication, without mention of misadventure at the time of the procedure: Secondary | ICD-10-CM | POA: Insufficient documentation

## 2015-09-14 DIAGNOSIS — Z9889 Other specified postprocedural states: Secondary | ICD-10-CM | POA: Insufficient documentation

## 2015-09-14 DIAGNOSIS — Z8249 Family history of ischemic heart disease and other diseases of the circulatory system: Secondary | ICD-10-CM | POA: Insufficient documentation

## 2015-09-14 DIAGNOSIS — N3041 Irradiation cystitis with hematuria: Secondary | ICD-10-CM | POA: Diagnosis not present

## 2015-09-14 DIAGNOSIS — E785 Hyperlipidemia, unspecified: Secondary | ICD-10-CM | POA: Insufficient documentation

## 2015-09-14 DIAGNOSIS — R339 Retention of urine, unspecified: Secondary | ICD-10-CM

## 2015-09-14 DIAGNOSIS — Z923 Personal history of irradiation: Secondary | ICD-10-CM | POA: Diagnosis not present

## 2015-09-14 DIAGNOSIS — I10 Essential (primary) hypertension: Secondary | ICD-10-CM | POA: Diagnosis not present

## 2015-09-14 DIAGNOSIS — Z9049 Acquired absence of other specified parts of digestive tract: Secondary | ICD-10-CM | POA: Diagnosis not present

## 2015-09-14 DIAGNOSIS — N3289 Other specified disorders of bladder: Secondary | ICD-10-CM | POA: Diagnosis not present

## 2015-09-14 DIAGNOSIS — Z8546 Personal history of malignant neoplasm of prostate: Secondary | ICD-10-CM | POA: Insufficient documentation

## 2015-09-14 DIAGNOSIS — Z87891 Personal history of nicotine dependence: Secondary | ICD-10-CM | POA: Insufficient documentation

## 2015-09-14 DIAGNOSIS — N365 Urethral false passage: Secondary | ICD-10-CM | POA: Insufficient documentation

## 2015-09-14 DIAGNOSIS — R319 Hematuria, unspecified: Secondary | ICD-10-CM | POA: Diagnosis not present

## 2015-09-14 DIAGNOSIS — R310 Gross hematuria: Secondary | ICD-10-CM | POA: Diagnosis not present

## 2015-09-14 HISTORY — PX: CYSTOSCOPY WITH FULGERATION: SHX6638

## 2015-09-14 LAB — CBC WITH DIFFERENTIAL/PLATELET
Basophils Absolute: 0 10*3/uL (ref 0–0.1)
Basophils Relative: 0 %
Eosinophils Absolute: 0 10*3/uL (ref 0–0.7)
Eosinophils Relative: 0 %
HCT: 33.8 % — ABNORMAL LOW (ref 40.0–52.0)
Hemoglobin: 12.1 g/dL — ABNORMAL LOW (ref 13.0–18.0)
Lymphocytes Relative: 13 %
Lymphs Abs: 1.5 10*3/uL (ref 1.0–3.6)
MCH: 33 pg (ref 26.0–34.0)
MCHC: 35.8 g/dL (ref 32.0–36.0)
MCV: 92.4 fL (ref 80.0–100.0)
Monocytes Absolute: 0.5 10*3/uL (ref 0.2–1.0)
Monocytes Relative: 5 %
Neutro Abs: 9.2 10*3/uL — ABNORMAL HIGH (ref 1.4–6.5)
Neutrophils Relative %: 82 %
Platelets: 255 10*3/uL (ref 150–440)
RBC: 3.66 MIL/uL — ABNORMAL LOW (ref 4.40–5.90)
RDW: 13.3 % (ref 11.5–14.5)
WBC: 11.2 10*3/uL — ABNORMAL HIGH (ref 3.8–10.6)

## 2015-09-14 LAB — BASIC METABOLIC PANEL
Anion gap: 12 (ref 5–15)
BUN: 18 mg/dL (ref 6–20)
CALCIUM: 8.7 mg/dL — AB (ref 8.9–10.3)
CO2: 18 mmol/L — ABNORMAL LOW (ref 22–32)
Chloride: 106 mmol/L (ref 101–111)
Creatinine, Ser: 1.02 mg/dL (ref 0.61–1.24)
GFR calc Af Amer: >60 mL/min (ref >60)
GLUCOSE: 114 mg/dL — AB (ref 65–99)
Potassium: 3.5 mmol/L (ref 3.5–5.1)
SODIUM: 136 mmol/L (ref 135–145)

## 2015-09-14 LAB — PROTIME-INR
INR: 1.03
Prothrombin Time: 13.5 seconds (ref 11.4–15.2)

## 2015-09-14 LAB — APTT: aPTT: 30 seconds (ref 24–36)

## 2015-09-14 SURGERY — CYSTOSCOPY, WITH BLADDER FULGURATION
Anesthesia: General

## 2015-09-14 MED ORDER — PROPOFOL 10 MG/ML IV BOLUS
INTRAVENOUS | Status: DC | PRN
  Administered 2015-09-14: 200 mg via INTRAVENOUS
  Administered 2015-09-14: 100 mg via INTRAVENOUS

## 2015-09-14 MED ORDER — LIDOCAINE HCL 2 % EX GEL
CUTANEOUS | Status: AC
  Filled 2015-09-14: qty 10

## 2015-09-14 MED ORDER — HYDROCODONE-ACETAMINOPHEN 5-325 MG PO TABS
ORAL_TABLET | ORAL | Status: AC
  Administered 2015-09-14: 1 via ORAL
  Filled 2015-09-14: qty 1

## 2015-09-14 MED ORDER — OXYCODONE HCL 5 MG/5ML PO SOLN: 5.0000 mg | Freq: Once | ORAL | Status: AC | PRN

## 2015-09-14 MED ORDER — MORPHINE SULFATE (PF) 4 MG/ML IV SOLN
INTRAVENOUS | Status: AC
  Administered 2015-09-14: 4 mg via INTRAVENOUS
  Filled 2015-09-14: qty 1

## 2015-09-14 MED ORDER — MORPHINE SULFATE (PF) 4 MG/ML IV SOLN
4.0000 mg | Freq: Once | INTRAVENOUS | Status: AC
  Administered 2015-09-14: 4 mg via INTRAMUSCULAR

## 2015-09-14 MED ORDER — CEFAZOLIN SODIUM-DEXTROSE 2-3 GM-% IV SOLR
INTRAVENOUS | Status: DC | PRN
Start: 2015-09-14 — End: 2015-09-14
  Administered 2015-09-14: 2 g via INTRAVENOUS

## 2015-09-14 MED ORDER — LIDOCAINE HCL (CARDIAC) 20 MG/ML IV SOLN
INTRAVENOUS | Status: DC | PRN
  Administered 2015-09-14: 30 mg via INTRAVENOUS

## 2015-09-14 MED ORDER — MORPHINE SULFATE (PF) 4 MG/ML IV SOLN
4.0000 mg | Freq: Once | INTRAVENOUS | Status: AC
  Administered 2015-09-14: 4 mg via INTRAVENOUS

## 2015-09-14 MED ORDER — LIDOCAINE HCL 2 % EX GEL
CUTANEOUS | Status: AC
  Administered 2015-09-14: 1 via URETHRAL
  Filled 2015-09-14: qty 10

## 2015-09-14 MED ORDER — LIDOCAINE HCL 2 % EX GEL
1.0000 "application " | Freq: Once | CUTANEOUS | Status: AC
  Administered 2015-09-14: 1 via URETHRAL
  Filled 2015-09-14 (×2): qty 5

## 2015-09-14 MED ORDER — FENTANYL CITRATE (PF) 100 MCG/2ML IJ SOLN
INTRAMUSCULAR | Status: DC | PRN
  Administered 2015-09-14 (×2): 100 ug via INTRAVENOUS

## 2015-09-14 MED ORDER — FENTANYL CITRATE (PF) 100 MCG/2ML IJ SOLN: 25.0000 ug | INTRAMUSCULAR | Status: DC | PRN

## 2015-09-14 MED ORDER — MIDAZOLAM HCL 2 MG/2ML IJ SOLN
INTRAMUSCULAR | Status: DC | PRN
  Administered 2015-09-14: 2 mg via INTRAVENOUS

## 2015-09-14 MED ORDER — OXYCODONE HCL 5 MG PO TABS
5.0000 mg | ORAL_TABLET | Freq: Once | ORAL | Status: AC | PRN
  Administered 2015-09-14: 5 mg via ORAL

## 2015-09-14 MED ORDER — DEXAMETHASONE SODIUM PHOSPHATE 10 MG/ML IJ SOLN
INTRAMUSCULAR | Status: DC | PRN
  Administered 2015-09-14: 5 mg via INTRAVENOUS

## 2015-09-14 MED ORDER — LACTATED RINGERS IV SOLN
INTRAVENOUS | Status: DC | PRN
  Administered 2015-09-14: 09:00:00 via INTRAVENOUS

## 2015-09-14 MED ORDER — ONDANSETRON HCL 4 MG/2ML IJ SOLN
INTRAMUSCULAR | Status: DC | PRN
  Administered 2015-09-14: 4 mg via INTRAVENOUS

## 2015-09-14 MED ORDER — OXYCODONE HCL 5 MG PO TABS
ORAL_TABLET | ORAL | Status: AC
  Filled 2015-09-14: qty 1

## 2015-09-14 MED ORDER — ONDANSETRON HCL 4 MG/2ML IJ SOLN
INTRAMUSCULAR | Status: AC
  Administered 2015-09-14: 4 mg via INTRAVENOUS
  Filled 2015-09-14: qty 2

## 2015-09-14 MED ORDER — HYDROCODONE-ACETAMINOPHEN 5-325 MG PO TABS
1.0000 | ORAL_TABLET | Freq: Once | ORAL | Status: AC
  Administered 2015-09-14: 1 via ORAL

## 2015-09-14 MED ORDER — MORPHINE SULFATE (PF) 4 MG/ML IV SOLN
INTRAVENOUS | Status: AC
  Filled 2015-09-14: qty 1

## 2015-09-14 MED ORDER — EPHEDRINE SULFATE 50 MG/ML IJ SOLN
INTRAMUSCULAR | Status: DC | PRN
  Administered 2015-09-14: 15 mg via INTRAVENOUS
  Administered 2015-09-14: 10 mg via INTRAVENOUS
  Administered 2015-09-14: 15 mg via INTRAVENOUS

## 2015-09-14 MED ORDER — ONDANSETRON HCL 4 MG/2ML IJ SOLN
4.0000 mg | Freq: Once | INTRAMUSCULAR | Status: AC
  Administered 2015-09-14: 4 mg via INTRAVENOUS

## 2015-09-14 SURGICAL SUPPLY — 22 items
BAG DRAIN CYSTO-URO LG1000N (MISCELLANEOUS) ×2 IMPLANT
BAG URO DRAIN 4000ML (MISCELLANEOUS) ×2 IMPLANT
CATH COUNCIL 22FR (CATHETERS) ×2 IMPLANT
CONRAY 43 FOR UROLOGY 50M (MISCELLANEOUS) IMPLANT
EVACUATOR ELLICK (MISCELLANEOUS) ×2 IMPLANT
GLOVE BIO SURGEON STRL SZ 6.5 (GLOVE) IMPLANT
GLOVE BIO SURGEON STRL SZ7 (GLOVE) ×6 IMPLANT
GOWN STRL REUS W/ TWL LRG LVL3 (GOWN DISPOSABLE) ×2 IMPLANT
GOWN STRL REUS W/TWL LRG LVL3 (GOWN DISPOSABLE) ×2
INTRODUCER DILATOR DOUBLE (INTRODUCER) IMPLANT
PACK CYSTO AR (MISCELLANEOUS) ×2 IMPLANT
PREP PVP WINGED SPONGE (MISCELLANEOUS) ×2 IMPLANT
SENSORWIRE 0.038 NOT ANGLED (WIRE) ×4
SET CYSTO W/LG BORE CLAMP LF (SET/KITS/TRAYS/PACK) ×2 IMPLANT
SHEATH URETERAL 12FRX35CM (MISCELLANEOUS) IMPLANT
SOL .9 NS 3000ML IRR  AL (IV SOLUTION) ×1
SOL .9 NS 3000ML IRR UROMATIC (IV SOLUTION) ×1 IMPLANT
SOL PREP PVP 2OZ (MISCELLANEOUS) ×2
SOLUTION PREP PVP 2OZ (MISCELLANEOUS) ×1 IMPLANT
WATER STERILE IRR 1000ML POUR (IV SOLUTION) ×4 IMPLANT
WATER STERILE IRR 3000ML UROMA (IV SOLUTION) ×2 IMPLANT
WIRE SENSOR 0.038 NOT ANGLED (WIRE) ×2 IMPLANT

## 2015-09-14 NOTE — ED Notes (Signed)
Pt continues to c/o urinary discomfort. Dr. Maudie Mercury at bedside consenting patient.  Pt last ate dinner last night. Family at bedside. IV in place.

## 2015-09-14 NOTE — ED Notes (Signed)
Family at bedside patient up to the bathroom.

## 2015-09-14 NOTE — Anesthesia Procedure Notes (Signed)
Procedure Name: Intubation Performed by: Cheridan Kibler Pre-anesthesia Checklist: Patient identified, Patient being monitored, Timeout performed, Emergency Drugs available and Suction available Patient Re-evaluated:Patient Re-evaluated prior to inductionOxygen Delivery Method: Circle system utilized Preoxygenation: Pre-oxygenation with 100% oxygen Intubation Type: IV induction Ventilation: Mask ventilation without difficulty Laryngoscope Size: Mac and 3 Grade View: Grade I Tube type: Oral Tube size: 7.0 mm Number of attempts: 1 Airway Equipment and Method: Stylet Placement Confirmation: ETT inserted through vocal cords under direct vision,  positive ETCO2 and breath sounds checked- equal and bilateral Secured at: 21 cm Tube secured with: Tape Dental Injury: Teeth and Oropharynx as per pre-operative assessment        

## 2015-09-14 NOTE — Anesthesia Preprocedure Evaluation (Signed)
Anesthesia Evaluation  Patient identified by MRN, date of birth, ID band Patient awake    Reviewed: Allergy & Precautions, H&P , NPO status , Patient's Chart, lab work & pertinent test results  Airway Mallampati: II  TM Distance: >3 FB Neck ROM: full    Dental no notable dental hx. (+) Poor Dentition, Missing, Edentulous Upper, Edentulous Lower   Pulmonary neg shortness of breath, former smoker,    Pulmonary exam normal breath sounds clear to auscultation       Cardiovascular Exercise Tolerance: Good hypertension, (-) angina(-) Past MI and (-) DOE Normal cardiovascular exam Rhythm:regular Rate:Normal     Neuro/Psych negative neurological ROS  negative psych ROS   GI/Hepatic negative GI ROS, Neg liver ROS, neg GERD  ,  Endo/Other  negative endocrine ROS  Renal/GU      Musculoskeletal   Abdominal   Peds  Hematology negative hematology ROS (+)   Anesthesia Other Findings Fully obstructed bladder   Past Medical History: No date: Cancer ([place]) No date: Hyperlipidemia No date: Hypertension  Past Surgical History: No date: APPENDECTOMY 08/19/2015: CYSTOSCOPY WITH FULGERATION N/A     Comment: Procedure: CYSTOSCOPY WITH FULGERATION / CLOT               EVACUATION;  Surgeon: Royston Cowper, MD;                Location: [facility] ORS;  Service: Urology;                Laterality: N/A;  BMI    Body Mass Index:  26.63 kg/m      Reproductive/Obstetrics negative OB ROS                             Anesthesia Physical Anesthesia Plan  ASA: III and emergent  Anesthesia Plan: General ETT   Post-op Pain Management:    Induction:   Airway Management Planned:   Additional Equipment:   Intra-op Plan:   Post-operative Plan:   Informed Consent: I have reviewed the patients History and Physical, chart, labs and discussed the procedure including the risks, benefits and alternatives for the  proposed anesthesia with the patient or authorized representative who has indicated his/her understanding and acceptance.     Plan Discussed with: Anesthesiologist, CRNA and Surgeon  Anesthesia Plan Comments:         Anesthesia Quick Evaluation

## 2015-09-14 NOTE — Anesthesia Postprocedure Evaluation (Signed)
Anesthesia Post Note  Patient: Yeiden Vanwhy  Procedure(s) Performed: Procedure(s) (LRB): CYSTOSCOPY WITH clot evacuation (N/A)  Patient location during evaluation: PACU Anesthesia Type: General Level of consciousness: awake and alert Pain management: pain level controlled Vital Signs Assessment: post-procedure vital signs reviewed and stable Respiratory status: spontaneous breathing, nonlabored ventilation, respiratory function stable and patient connected to nasal cannula oxygen Cardiovascular status: blood pressure returned to baseline and stable Postop Assessment: no signs of nausea or vomiting Anesthetic complications: no    Last Vitals:  Vitals:   09/14/15 1054 09/14/15 1142  BP: (!) 153/98 (!) 148/87  Pulse: (!) 108 (!) 104  Resp: 18 18  Temp: 37.2 C 37.3 C    Last Pain:  Vitals:   09/14/15 1142  TempSrc: Temporal  PainSc: 3                  Precious Haws Shahid Flori

## 2015-09-14 NOTE — ED Triage Notes (Signed)
Patient states that he has radiation cystitis. Patient states that he self cath 3 times a day. He states that tonight he started bleeding. Patient reports that he is unable to empty his bladder when he cath it is clotting. Bladder scan 248 ml.

## 2015-09-14 NOTE — Op Note (Signed)
Cystoscopy Procedure Note  Indications: Gross Hematuria with Clot Retention  Pre-operative Diagnosis: Gross Hematuria due to Hemorrhagic Radiation Cystitis  Post-operative Diagnosis: Same, plus Posterior Bulbar Urethral False Passage and Catheter trauma to the prostate floor  Surgeon: Darcella Cheshire   Assistants: None  Anesthesia: General endotracheal anesthesia  ASA Class: per Anesthesia  Procedure Details  The risks, benefits, complications, treatment options, and expected outcomes were discussed with the patient. The patient concurred with the proposed plan, giving informed consent.  Cystoscopy was performed using sterile technique. The patient was placed in the lithotomy position, prepped with Betadine, and draped in the usual sterile fashion. A 21 French sheath rigid cystoscope was used to inspect the urethra, prostate fossa and bladder using the 30 degree lenses.  There was a large posterior urethral false passage in the bulbar urethra.  The true lumen was identified at the 11 o'clock position.  The cystoscope was able to cannulate the true lumen and enter the prostate fossa.  A short, post-radiation prostate fossa was appreciated with mucosal shearing identified at the prostate floor just distal to the bladder neck. There was elevation of the prostate floor appreciated resulting in bladder neck elevation. "Ratty" mucosal irregularity with mild oozing was appreciated at the left upper quadrant of the bladder neck.  Within the bladder, there was diffuse bullous mucosal edema with mucosal telangiectasias primarily at the bladder base.  A large amount of dark clot was appreciated with approximately 100 cc evacuated using the [employer] and the [employer].  There was no active bleeding identified following clot evacuation, aside from some mild oozing from the left upper quadrant of the bladder neck.  A straight tip 0.035 Sensor wire was advanced through the cystoscope into the  bladder lumen and the cystoscope withdrawn.  A 22 Fr Councill Catheter was advanced over the Sensor wire into the bladder.  The Sensor wire was withdrawn and the catheter balloon inflated with 45mL of sterile water.  A 3 way foley catheter was not placed as a catheter punch was not available.  Given the large posterior bulbar urethral false passage, it was opted not to attempt passage of a 3 way foley without being able to advance one over a wire.  The 22 Fr Councill catheter was irrigated with the effluent returning clear consistent with the absence of any significant, ongoing bleeding.  The catheter was placed to straight drain.  The patient was taken out of the dorsal lithotomy position, extubated and transferred to the PACU in stable condition having tolerated the procedure well.  Findings: Anterior urethra: normal without strictures and without scarring. There was a large posterior urethral false passage in the bulbar urethra. Prostatic urethra: short, post-radiation prostate fossa with mucosal shearing at the prostate floor just distal to the bladder neck. Elevation of the prostate floor with bladder neck elevation. "Ratty" mucosal irregularity with mild oozing, left upper quadrant of the bladder neck. Bladder: diffuse bullous mucosal edema with mucosal telangiectasias at the bladder base.  100cc of clot. Ureteral orifice(s) was/were seen bilateral. Ureteral orifice(s) bilateral were in the normal location.        Specimens: None               Complications:  None; patient tolerated the procedure well.         Disposition: To home after 120 minute observation.         Condition: stable  Attending Attestation: I performed the procedure.

## 2015-09-14 NOTE — Transfer of Care (Signed)
Immediate Anesthesia Transfer of Care Note  Patient: [name]  Procedure(s) Performed: Procedure(s): CYSTOSCOPY WITH clot evacuation (N/A)  Patient Location: PACU  Anesthesia Type:General  Level of Consciousness: awake, alert  and oriented  Airway & Oxygen Therapy: Patient Spontanous Breathing and Patient connected to face mask oxygen  Post-op Assessment: Report given to RN and Post -op Vital signs reviewed and stable  Post vital signs: Reviewed and stable  Last Vitals:  Vitals:   09/14/15 0821 09/14/15 0947  BP: (!) 193/97 107/62  Pulse: (!) 110 (!) 109  Resp: (!) 22 16  Temp:  37.2 C    Last Pain:  Vitals:   09/14/15 0552  TempSrc:   PainSc: 10-Worst pain ever         Complications: No apparent anesthesia complications

## 2015-09-14 NOTE — Discharge Instructions (Signed)
°  You may use a leg bag when up and walking around. When laying down and at night use standard bag.    AMBULATORY SURGERY  DISCHARGE INSTRUCTIONS   1) The drugs that you were given will stay in your system until tomorrow so for the next 24 hours you should not:  A) Drive an automobile B) Make any legal decisions C) Drink any alcoholic beverage   2) You may resume regular meals tomorrow.  Today it is better to start with liquids and gradually work up to solid foods.  You may eat anything you prefer, but it is better to start with liquids, then soup and crackers, and gradually work up to solid foods.   3) Please notify your doctor immediately if you have any unusual bleeding, trouble breathing, redness and pain at the surgery site, drainage, fever, or pain not relieved by medication.    4) Additional Instructions:        Please contact your physician with any problems or Same Day Surgery at [phone], Monday through Friday 6 am to 4 pm, or [place] at [facility] number at [phone].

## 2015-09-14 NOTE — H&P (Signed)
Please see the Urology Consultation performed in the ER today.

## 2015-09-14 NOTE — H&P (Signed)
Urology Consult  Referring physician: Marjean Donna, MD (ER) Reason for referral: Gross Hematuria with Urinary Clot Retention  Chief Complaint: Gross Hematuria with Clot Retention  History of Present Illness: [name] with h/o Radiation Cystitis s/p EBRT/Brachytherapy for Prostate Cancer in 2013, s/p Cysto/Clot Evacuation/Fulguration of Bleeding Points on 08/19/2015 with Dr. Maryan Puls.  Pt failed mutiple voiding trials post-op requiring the initiation of CIC q8hrs and prn with a 14 Fr Coude catheter along with Rapaflo 8mg  po qDay.  The pt only passes a small amount of urine spontaneously.  Pt has since started Hyperbaric O2 Rx's completing 9 of 40 treatments.    The pt has not had any recurrent gross hematuria until last night at 8pm when he catheterized, but only emptied a small amount of red blood.  This happened, again, at 12:30am.  There was no difficulty with catheterization.  The pt had catheterized earlier yesterday at 4pm, without difficulty, draining 458mL of clear amber urine.  Since the recurrence of gross hematuria, the pt has had increasing bladder spasms with the passage of only a small amount of grossly bloody urine.  The pt denies dysuria, urinary freq/urgency, F/C. Pt endorses recent blood on the TP due to hemorrhoids. Pt denies h/o bleeding diathesis.  Pt [facility] to the [facility] ER at 2:20am, afebrile, with a mild tachycardia (101) and increased BP (181/98).  Dr. Owens Shark was able to place an 18 Fr foley catheter, however, this quickly clotted off and was attempts at irrigation were unsuccessful.  Attempts at placing a larger catheter were unsuccessful.  Past Medical History:  Diagnosis Date  . Cancer [facility]) - Prostate Cancer s/p EBRT/Brachytherapy/ADT x 1 yr (2013)   . Hyperlipidemia   . Hypertension    Past Surgical History:  Procedure Laterality Date  . APPENDECTOMY    . CYSTOSCOPY WITH FULGERATION N/A 08/19/2015   Procedure: CYSTOSCOPY WITH FULGERATION / CLOT  EVACUATION;  Surgeon: Royston Cowper, MD;  Location: [facility] ORS;  Service: Urology;  Laterality: N/A;    Medications: I have reviewed the patient's current medications. Prior to Admission:  (Not in a hospital admission)  Lipitor 10mg  po qDay Lisinopril 40mg  po qDay Nebivolol 5mg  po qDay Nucynta 50mg  q6hrs prn Rapaflo 8mg  po qDay (w/Breakfast)  Allergies: No Known Allergies  Family History  Problem Relation Age of Onset  . Hypertension Mother   . Hypertension Father   . Hypertension Sister   . Hypertension Brother   Prostate Cancer (Brother - recently diagnosed - undergoing EBRT in [place]. [place])  Social History:  reports that he quit smoking about 3 years ago. His smoking use included Cigarettes. He has never used smokeless tobacco. He reports that he drinks about 1.2 oz of alcohol per week . He reports that he does not use drugs. 1ppd x 40 yrs (d/c'd x 23yrs) Drinks 3 beers/day with an occas mixed drink (denies dependence, DT's, withdrawal)  ROS per HPI with the remainder of a 12 system review neg  Physical Exam:  Vital signs in last 24 hours: Temp:  [98.4 F (36.9 C)] 98.4 F (36.9 C) (09/10 0222) Pulse Rate:  [101] 101 (09/10 0556) Resp:  [20] 20 (09/10 0556) BP: (181)/(98) 181/98 (09/10 0556) SpO2:  [100 %] 100 % (09/10 0556) Weight:  [72.6 kg (160 lb)] 72.6 kg (160 lb) (09/10 0228)   Physical Exam Gen: WDWN WM in NAD Skin: Warm/Dry without Lesions about the Head/Neck; no cervical/supraclavicular/inguinal adenopathy HEENT: [place]/AT, EOMI, anicteric Neck: no masses/bruits Chest: CTA, nL  respiratory effort Cor: RRR w/out M/G/R Abd: NT/ND, NABS, no palpable masses/organomegaly. No IH. GU: nL uncirc phallus without lesion, b/L descended testes w/out masses - NT DRE: NST, flat, atrophic prostate - NT, no nodules; empty rectal vault Ext: NT, no edema Neuro: Non-focal Psych: A&O x4, pleasant and cooperative   Laboratory Data:  No results found for this or any previous  visit (from the past 72 hour(s)). No results found for this or any previous visit (from the past 240 hour(s)).  CBC, BMP, PT/PTT ordered in the ER, STAT  Creatinine: No results for input(s): CREATININE in the last 168 hours. Baseline Creatinine: 0.9 (08/02/2015)  Impression/Assessment:  Radiation Cystitis with recurrent gross hematuria/clot retention -no signs/sx's of UTI  Plan:  Urgent Cysto with Clot Evacuation/Fulguration of Bleeding Points under anesthesia  [name] 09/14/2015, 8:03 AM

## 2015-09-14 NOTE — ED Provider Notes (Signed)
[facility] Emergency Department Provider Note   Time seen: I have reviewed the triage vital signs and the nursing notes.   HISTORY  Chief Complaint Urinary Retention    HPI [name] is a 60 y.o. male with history of prostate cancer and radiation cystitis, self-catheterization at home presents to the emergency department with inability to void with blood clots noted. Patient states that he performs self-catheterization at 4 PM yesterday with foreign ML's of clear urine noted however he stated approximately 7 PM he felt as though he needed to catheterize himself again upon doing so he noted large clots and inability to pass a catheter as usual. On arrival to the emergency department the patient's bladder scan revealed 248 ML's of urine. Patient's current pain score 9 out of 10   Past Medical History:  Diagnosis Date  . Cancer ([place])   . Hyperlipidemia   . Hypertension     Patient Active Problem List   Diagnosis Date Noted  . Routine general medical examination at a health care facility 11/22/2012  . HTN (hypertension) 03/02/2012  . Hyperlipidemia 03/02/2012  . Prostate cancer ([place]) 03/02/2012    Past Surgical History:  Procedure Laterality Date  . APPENDECTOMY    . CYSTOSCOPY WITH FULGERATION N/A 08/19/2015   Procedure: CYSTOSCOPY WITH FULGERATION / CLOT EVACUATION;  Surgeon: Royston Cowper, MD;  Location: [facility] ORS;  Service: Urology;  Laterality: N/A;    Prior to Admission medications   Medication Sig Start Date End Date Taking? Authorizing Provider  atorvastatin (LIPITOR) 10 MG tablet TAKE 1 TABLET (10 MG TOTAL) BY MOUTH DAILY. 05/19/15   Midge Minium, MD  docusate sodium (COLACE) 100 MG capsule Take 2 capsules (200 mg total) by mouth 2 (two) times daily. 08/19/15   Royston Cowper, MD  lisinopril (PRINIVIL,ZESTRIL) 40 MG tablet Take 1 tablet (40 mg total) by mouth daily. 05/19/15   Midge Minium, MD  nebivolol (BYSTOLIC) 5 MG  tablet TAKE 1 TABLET (5 MG TOTAL) BY MOUTH DAILY. 05/05/15   Midge Minium, MD  NUCYNTA 50 MG tablet Take 1 tablet (50 mg total) by mouth every 6 (six) hours as needed. 1 TO 2 TABS Q 6 HOURS PRN PAIN 08/19/15   Royston Cowper, MD  silodosin (RAPAFLO) 8 MG CAPS capsule Take 8 mg by mouth daily with breakfast.    Historical Provider, MD  sulfamethoxazole-trimethoprim (BACTRIM DS,SEPTRA DS) 800-160 MG tablet Take 1 tablet by mouth 2 (two) times daily. 08/19/15   Royston Cowper, MD  tadalafil (CIALIS) 5 MG tablet TAKE 1 TABLET BY MOUTH DAILY FOR BPH 11/20/14   Midge Minium, MD  tolterodine (DETROL LA) 4 MG 24 hr capsule Take 1 capsule (4 mg total) by mouth daily. 08/19/15   Royston Cowper, MD  verapamil (VERELAN PM) 360 MG 24 hr capsule TAKE 1 CAPSULE (360 MG TOTAL) BY MOUTH DAILY. 08/04/15   Midge Minium, MD    Allergies No known drug allergies  Family History  Problem Relation Age of Onset  . Hypertension Mother   . Hypertension Father   . Hypertension Sister   . Hypertension Brother     Social History Social History  Substance Use Topics  . Smoking status: Former Smoker    Types: Cigarettes    Quit date: 09/24/2011  . Smokeless tobacco: Never Used  . Alcohol use 1.2 oz/week    2 Cans of beer per week    Review of Systems Constitutional:  No fever/chills Eyes: No visual changes. ENT: No sore throat. Cardiovascular: Denies chest pain. Respiratory: Denies shortness of breath. Gastrointestinal: No abdominal pain.  No nausea, no vomiting.  No diarrhea.  No constipation. Genitourinary: Negative for dysuria.Positive for suprapubic pain and inability to void positive hematuria Musculoskeletal: Negative for back pain. Skin: Negative for rash. Neurological: Negative for headaches, focal weakness or numbness.  10-point ROS otherwise negative.  ____________________________________________   PHYSICAL EXAM:  VITAL SIGNS: ED Triage Vitals  Enc Vitals Group     BP  09/14/15 0222 (!) 181/98     Pulse Rate 09/14/15 0222 (!) 101     Resp 09/14/15 0222 20     Temp 09/14/15 0222 98.4 F (36.9 C)     Temp Source 09/14/15 0222 Oral     SpO2 09/14/15 0222 100 %     Weight 09/14/15 0228 160 lb (72.6 kg)     Height 09/14/15 0222 5\' 5"  (1.651 m)     Head Circumference --      Peak Flow --      Pain Score 09/14/15 0223 8     Pain Loc --      Pain Edu? --      Excl. in [place]? --     Constitutional: Alert and oriented. Well appearing and in no acute distress. Eyes: Conjunctivae are normal. PERRL. EOMI. Head: Atraumatic. Mouth/Throat: Mucous membranes are moist.  Oropharynx non-erythematous. Neck: No stridor.  No meningeal signs.  No cervical spine tenderness to palpation. Cardiovascular: Normal rate, regular rhythm. Good peripheral circulation. Grossly normal heart sounds. Respiratory: Normal respiratory effort.  No retractions. Lungs CTAB. Gastrointestinal: Soft and nontender. No distention.  Genitourinary: Large clots noted during catheterization attempt Musculoskeletal: No lower extremity tenderness nor edema. No gross deformities of extremities. Neurologic:  Normal speech and language. No gross focal neurologic deficits are appreciated.  Skin:  Skin is warm, dry and intact. No rash noted. Psychiatric: Mood and affect are normal. Speech and behavior are normal.  _______________________   Procedures      INITIAL IMPRESSION / ASSESSMENT AND PLAN / ED COURSE  Pertinent labs & imaging results that were available during my care of the patient were reviewed by me and considered in my medical decision making (see chart for details).  I attempted to catheterize the patient's urine or bladder however was unable to pass the catheter beyond the patient's prostate. As such Dr. Maudie Mercury urologist on call was notified approximate 5:30 AM. Will evaluate the patient in the emergency department. Patient's care transferred to Dr. Archie Balboa   Clinical Course     ____________________________________________  FINAL CLINICAL IMPRESSION(S) / ED DIAGNOSES  Final diagnoses:  Hematuria  Urinary retention     MEDICATIONS GIVEN DURING THIS VISIT:  Medications  lidocaine (XYLOCAINE) 2 % jelly 1 application (1 application Urethral Given 09/14/15 0435)  HYDROcodone-acetaminophen (NORCO/VICODIN) 5-325 MG per tablet 1 tablet (1 tablet Oral Given 09/14/15 0318)  morphine 4 MG/ML injection 4 mg (4 mg Intramuscular Given 09/14/15 0434)  ondansetron (ZOFRAN) injection 4 mg (4 mg Intravenous Given 09/14/15 0507)  morphine 4 MG/ML injection 4 mg (4 mg Intravenous Given 09/14/15 0507)     NEW OUTPATIENT MEDICATIONS STARTED DURING THIS VISIT:  New Prescriptions   No medications on file    Modified Medications   No medications on file    Discontinued Medications   No medications on file     Note:  This document was prepared using Dragon voice recognition software and may include unintentional  dictation errors.    Gregor Hams, MD 09/14/15 0730

## 2015-09-15 ENCOUNTER — Encounter: Payer: Federal, State, Local not specified - PPO | Admitting: Surgery

## 2015-09-15 ENCOUNTER — Encounter: Payer: Self-pay | Admitting: Urology

## 2015-09-15 DIAGNOSIS — R338 Other retention of urine: Secondary | ICD-10-CM | POA: Diagnosis not present

## 2015-09-15 DIAGNOSIS — Z8546 Personal history of malignant neoplasm of prostate: Secondary | ICD-10-CM | POA: Diagnosis not present

## 2015-09-15 DIAGNOSIS — N3041 Irradiation cystitis with hematuria: Secondary | ICD-10-CM | POA: Diagnosis not present

## 2015-09-15 DIAGNOSIS — L598 Other specified disorders of the skin and subcutaneous tissue related to radiation: Secondary | ICD-10-CM | POA: Diagnosis not present

## 2015-09-15 NOTE — Progress Notes (Signed)
[name], [name] ([id number]) Visit Report for 09/15/2015 HBO Details Patient Name: [name], [name] Date of Service: 09/15/2015 10:00 AM Medical Record Number: [id number] Patient Account Number: [id number] Date of Birth/Sex: 11/30/1955 (60 y.o. 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 HBO Treatment Details Treatment Number: 10 Patient Type: Outpatient Chamber Type: Monoplace Chamber Serial #: [id number] Treatment Protocol: 2.0 ATA with 90 minutes oxygen, and no air breaks Treatment Details Compression Rate Down: 1.5 psi / minute De-Compression Rate Up: 1.5 psi / minute Air breaks and breathing Compress Tx Pressure periods Decompress Decompress Begins Reached (leave unused spaces Begins Ends blank) Chamber Pressure (ATA) 1 2 - - - - - - 2 1 Clock Time (24 hr) 09:38 09:50 - - - - - - 11:20 11:31 Treatment Length: 113 (minutes) Treatment Segments: 4 Capillary Blood Glucose Pre Capillary Blood Glucose (mg/dl): Post Capillary Blood Glucose (mg/dl): Vital Signs Capillary Blood Glucose Reference Range: 80 - 120 mg / dl HBO Diabetic Blood Glucose Intervention Range: <131 mg/dl or >249 mg/dl Time Vitals Blood Respiratory Capillary Blood Glucose Pulse Action Type: Pulse: Temperature: Taken: Pressure: Rate: Glucose (mg/dl): Meter #: Oximetry (%) Taken: Pre 09:10 140/82 78 16 99.3 Post 11:35 148/64 72 16 99 Treatment Response Treatment Completion Status: Treatment Completed without Adverse Event Hand, Darby ([id number]) Electronic Signature(s) Signed: 09/15/2015 1:20:26 PM By: Christin Fudge MD, FACS Previous  Signature: 09/15/2015 12:31:43 PM Version By: Christin Fudge MD, FACS Entered By: Christin Fudge on 09/15/2015 13:20:25 Dupree, Keiji ([id number]) -------------------------------------------------------------------------------- HBO Safety Checklist Details Patient Name: [name] Date of Service: 09/15/2015 10:00 AM Medical Record Number: [id number] Patient Account Number: [id number] Date of Birth/Sex: October 22, 1955 (60 y.o. Male) Treating RN: Primary Care Physician: Annye Asa Other Clinician: Jacqulyn Bath Referring Physician: Annye Asa Treating Physician/Extender: Frann Rider in Treatment: 2 HBO Safety Checklist Items Safety Checklist Consent Form Signed Patient voided / foley secured and emptied When did you last eato 09/14/15 pm Last dose of injectable or oral agent n/a NA Ostomy pouch emptied and vented if applicable NA All implantable devices assessed, documented and approved NA Intravenous access site secured and place Valuables secured Linens and cotton and cotton/polyester blend (less than 51% polyester) Personal oil-based products / skin lotions / body lotions removed NA Wigs or hairpieces removed NA Smoking or tobacco materials removed Books / newspapers / magazines / loose paper removed Cologne, aftershave, perfume and deodorant removed Jewelry removed (may wrap wedding band) Make-up removed Hair care products removed Battery operated devices (external) removed Heating patches and chemical warmers removed NA Titanium eyewear removed NA Nail polish cured greater than 10 hours NA Casting material cured greater than 10 hours NA Hearing aids removed Loose dentures or partials removed NA Prosthetics have been removed Patient demonstrates correct use of air break device (if applicable) Patient concerns have been addressed Patient grounding bracelet on and cord attached to chamber Specifics for Inpatients (complete in addition to  above) Medication sheet sent with patient Intravenous medications needed or due during therapy sent with patient CEDILLOS, Dow ([id number]) Drainage tubes (e.g. nasogastric tube or chest tube secured and vented) Endotracheal or Tracheotomy tube secured Cuff deflated of air and inflated with saline Airway suctioned Electronic Signature(s) Signed: 09/15/2015 2:22:39 PM By: Lorine Bears RCP, RRT,  CHT Entered By: Lorine Bears on 09/15/2015 09:52:53

## 2015-09-15 NOTE — Progress Notes (Signed)
[name], [name] ([id number]) Visit Report for 09/15/2015 Arrival Information Details Patient Name: [name], [name] Date of Service: 09/15/2015 10:00 AM Medical Record Number: [id number] Patient Account Number: [id number] Date of Birth/Sex: 12/15/1955 (60 y.o. Male) Treating RN: Primary Care Physician: Annye Asa Other Clinician: Jacqulyn Bath Referring Physician: Annye Asa Treating Physician/Extender: Frann Rider in Treatment: 2 Visit Information History Since Last Visit Added or deleted any medications: No Patient Arrived: Ambulatory Any new allergies or adverse reactions: No Arrival Time: 09:10 Had a fall or experienced change in No Accompanied By: self activities of daily living that may affect Transfer Assistance: None risk of falls: Patient Identification Verified: Yes Signs or symptoms of abuse/neglect since last No Secondary Verification Process Yes visito Completed: Hospitalized since last visit: No Patient Requires Transmission-Based No Pain Present Now: No Precautions: Patient Has Alerts: No Electronic Signature(s) Signed: 09/15/2015 2:22:39 PM By: Lorine Bears RCP, RRT, CHT Entered By: Becky Sax, Amado Nash on 09/15/2015 09:51:23 [name], [name] ([id number]) -------------------------------------------------------------------------------- Encounter Discharge Information Details Patient Name: [name] Date of Service: 09/15/2015 10:00 AM Medical Record Number: [id number] Patient Account Number: [id number] Date of Birth/Sex: 01-14-1955 (60 y.o. Male) Treating RN: Primary Care Physician: Annye Asa Other Clinician: Jacqulyn Bath Referring Physician: Annye Asa Treating Physician/Extender: Frann Rider in Treatment: 2 Encounter Discharge Information Items Discharge Pain Level: 0 Discharge Condition: Stable Ambulatory Status: Ambulatory Discharge Destination:  Home Private Transportation: Auto Accompanied By: self Schedule Follow-up Appointment: No Medication Reconciliation completed and No provided to Patient/Care Serafina Topham: Clinical Summary of Care: Notes Patient has an HBO treatment scheduled on 09/16/15 at 10:00 am. Electronic Signature(s) Signed: 09/15/2015 2:22:39 PM By: Lorine Bears RCP, RRT, CHT Entered By: Lorine Bears on 09/15/2015 13:12:25 [name], [name] ([id number]) -------------------------------------------------------------------------------- Vitals Details Patient Name: [name] Date of Service: 09/15/2015 10:00 AM Medical Record Number: [id number] Patient Account Number: [id number] Date of Birth/Sex: [birth year] (60 y.o. Male) Treating RN: Primary Care Physician: Annye Asa Other Clinician: Jacqulyn Bath Referring Physician: Annye Asa Treating Physician/Extender: Frann Rider in Treatment: 2 Vital Signs Time Taken: 09:10 Temperature (F): 99.3 Height (in): 65 Pulse (bpm): 78 Weight (lbs): 162 Respiratory Rate (breaths/min): 16 Body Mass Index (BMI): 27 Blood Pressure (mmHg): 140/82 Reference Range: 80 - 120 mg / dl Electronic Signature(s) Signed: 09/15/2015 2:22:39 PM By: Lorine Bears RCP, RRT, CHT Entered By: Becky Sax, Amado Nash on 09/15/2015 09:51:48

## 2015-09-16 ENCOUNTER — Encounter: Payer: Federal, State, Local not specified - PPO | Admitting: Internal Medicine

## 2015-09-16 DIAGNOSIS — N3041 Irradiation cystitis with hematuria: Secondary | ICD-10-CM | POA: Diagnosis not present

## 2015-09-16 DIAGNOSIS — Z8546 Personal history of malignant neoplasm of prostate: Secondary | ICD-10-CM | POA: Diagnosis not present

## 2015-09-16 DIAGNOSIS — L598 Other specified disorders of the skin and subcutaneous tissue related to radiation: Secondary | ICD-10-CM | POA: Diagnosis not present

## 2015-09-16 DIAGNOSIS — R338 Other retention of urine: Secondary | ICD-10-CM | POA: Diagnosis not present

## 2015-09-16 NOTE — Progress Notes (Signed)
BLAISE, FALANGA ([id number]) Visit Report for 09/16/2015 Arrival Information Details Patient Name: [name], [name] Date of Service: 09/16/2015 10:00 AM Medical Record Patient Account Number: [id number] Number: Treating RN: 28-Aug-1955 (60 y.o. Other Clinician: Jacqulyn Bath Date of Birth/Sex: Male) Treating Dellia Nims [place] Primary Care Physician: Annye Asa Physician/Extender: G Referring Physician: Letha Cape in Treatment: 3 Visit Information History Since Last Visit Added or deleted any medications: No Patient Arrived: Ambulatory Any new allergies or adverse reactions: No Arrival Time: 09:25 Had a fall or experienced change in No Accompanied By: self activities of daily living that may affect Transfer Assistance: None risk of falls: Patient Identification Verified: Yes Signs or symptoms of abuse/neglect since last No Secondary Verification Process Yes visito Completed: Hospitalized since last visit: No Patient Requires Transmission-Based No Pain Present Now: No Precautions: Patient Has Alerts: No Electronic Signature(s) Signed: 09/16/2015 1:10:48 PM By: Lorine Bears RCP, RRT, CHT Entered By: Lorine Bears on 09/16/2015 09:31:43 [name], [name] ([id number]) -------------------------------------------------------------------------------- Encounter Discharge Information Details Patient Name: [name] Date of Service: 09/16/2015 10:00 AM Medical Record Patient Account Number: [id number] Number: Treating RN: 03/06/1955 (60 y.o. Other Clinician: Jacqulyn Bath Date of Birth/Sex: Male) Treating Dellia Nims [place] Primary Care Physician: Annye Asa Physician/Extender: G Referring Physician: Letha Cape in Treatment: 3 Encounter Discharge Information Items Discharge Pain Level: 0 Discharge Condition: Stable Ambulatory Status: Ambulatory Discharge Destination:  Home Private Transportation: Auto Accompanied By: self Schedule Follow-up Appointment: No Medication Reconciliation completed and No provided to Patient/Care Nykiah Ma: Clinical Summary of Care: Notes Patient has an HBO treatment scheduled on 09/17/15. Electronic Signature(s) Signed: 09/16/2015 1:10:48 PM By: Lorine Bears RCP, RRT, CHT Entered By: Lorine Bears on 09/16/2015 13:10:14 [name], [name] ([id number]) -------------------------------------------------------------------------------- Vitals Details Patient Name: [name] Date of Service: 09/16/2015 10:00 AM Medical Record Patient Account Number: [id number] Number: Treating RN: 05-Jun-1955 (60 y.o. Other Clinician: Jacqulyn Bath Date of Birth/Sex: Male) Treating ROBSON, [place] Primary Care Physician: Annye Asa Physician/Extender: G Referring Physician: Letha Cape in Treatment: 3 Vital Signs Time Taken: 09:30 Temperature (F): 98.5 Height (in): 65 Pulse (bpm): 72 Weight (lbs): 162 Respiratory Rate (breaths/min): 16 Body Mass Index (BMI): 27 Blood Pressure (mmHg): 124/74 Reference Range: 80 - 120 mg / dl Electronic Signature(s) Signed: 09/16/2015 1:10:48 PM By: Lorine Bears RCP, RRT, CHT Entered By: Lorine Bears on 09/16/2015 09:32:17

## 2015-09-16 NOTE — Progress Notes (Signed)
JOCOB, ZALOUDEK ([id number]) Visit Report for 09/16/2015 HBO Details Patient Name: [name], [name] Date of Service: 09/16/2015 10:00 AM Medical Record Patient Account Number: [id number] Number: Treating RN: 09-21-1955 (60 y.o. 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 HBO Treatment Details Treatment Number: 11 Patient Type: Outpatient Chamber Type: Monoplace Chamber Serial #: [id number] Treatment Protocol: 2.0 ATA with 90 minutes oxygen, and no air breaks Treatment Details Compression Rate Down: 1.5 psi / minute De-Compression Rate Up: 1.5 psi / minute Air breaks and breathing Compress Tx Pressure periods Decompress Decompress Begins Reached (leave unused spaces Begins Ends blank) Chamber Pressure (ATA) 1 2 - - - - - - 2 1 Clock Time (24 hr) 09:37 09:48 - - - - - - 11:18 11:30 Treatment Length: 113 (minutes) Treatment Segments: 4 Capillary Blood Glucose Pre Capillary Blood Glucose (mg/dl): Post Capillary Blood Glucose (mg/dl): Vital Signs Capillary Blood Glucose Reference Range: 80 - 120 mg / dl HBO Diabetic Blood Glucose Intervention Range: <131 mg/dl or >249 mg/dl Time Vitals Blood Respiratory Capillary Blood Glucose Pulse Action Type: Pulse: Temperature: Taken: Pressure: Rate: Glucose (mg/dl): Meter #: Oximetry (%) Taken: Pre 09:30 124/74 72 16 98.5 Post 11:35 168/82 66 16 98.3 [name], [name] ([id number]) Electronic Signature(s) Signed: 09/16/2015 1:10:48 PM By: Paulla Fore, RRT, CHT Signed: 09/16/2015 4:19:04 PM By: Linton Ham MD Entered By: Lorine Bears on 09/16/2015 13:04:29 [name], [name] ([id number]) -------------------------------------------------------------------------------- HBO Safety Checklist Details Patient Name: [name] Date of Service: 09/16/2015 10:00 AM Medical Record Patient Account Number: [id number] Number: Treating RN: [birth year] (60 y.o. Other Clinician: Jacqulyn Bath Date of Birth/Sex: Male) Treating ROBSON, [place] Primary Care Physician: Annye Asa Physician/Extender: G Referring Physician: Letha Cape in Treatment: 3 HBO Safety Checklist Items Safety Checklist Consent Form Signed Patient voided / foley secured and emptied When did you last eato 09/15/15 pm Last dose of injectable or oral agent n/a NA Ostomy pouch emptied and vented if applicable NA All implantable devices assessed, documented and approved NA Intravenous access site secured and place Valuables secured Linens and cotton and cotton/polyester blend (less than 51% polyester) Personal oil-based products / skin lotions / body lotions removed NA Wigs or hairpieces removed NA Smoking or tobacco materials removed Books / newspapers / magazines / loose paper removed Cologne, aftershave, perfume and deodorant removed Jewelry removed (may wrap wedding band) NA Make-up removed NA Hair care products removed Battery operated devices (external) removed Heating patches and chemical warmers removed NA Titanium eyewear removed NA Nail polish cured greater than 10 hours NA Casting material cured greater than 10 hours NA Hearing aids removed Loose dentures or partials removed NA Prosthetics have been removed Patient demonstrates correct use of air break device (if applicable) Patient concerns have been addressed Patient grounding bracelet on and cord attached to chamber Specifics for Inpatients (complete in addition to above) Medication sheet sent with patient [name], [name]  ([id number]) Intravenous medications needed or due during therapy sent with patient Drainage tubes (e.g. nasogastric tube or chest tube secured and vented) Endotracheal or Tracheotomy tube secured Cuff deflated of air and inflated with saline Airway suctioned Electronic Signature(s) Signed: 09/16/2015 1:10:48 PM By: Becky Sax, Sallie RCP, RRT, CHT Entered By: Lorine Bears  on 09/16/2015 09:39:09

## 2015-09-17 ENCOUNTER — Encounter: Payer: Federal, State, Local not specified - PPO | Admitting: Internal Medicine

## 2015-09-17 DIAGNOSIS — L598 Other specified disorders of the skin and subcutaneous tissue related to radiation: Secondary | ICD-10-CM | POA: Diagnosis not present

## 2015-09-17 DIAGNOSIS — N3041 Irradiation cystitis with hematuria: Secondary | ICD-10-CM | POA: Diagnosis not present

## 2015-09-17 DIAGNOSIS — R310 Gross hematuria: Secondary | ICD-10-CM | POA: Diagnosis not present

## 2015-09-17 DIAGNOSIS — R338 Other retention of urine: Secondary | ICD-10-CM | POA: Diagnosis not present

## 2015-09-17 DIAGNOSIS — Z8546 Personal history of malignant neoplasm of prostate: Secondary | ICD-10-CM | POA: Diagnosis not present

## 2015-09-18 ENCOUNTER — Encounter: Payer: Federal, State, Local not specified - PPO | Admitting: Surgery

## 2015-09-18 DIAGNOSIS — R338 Other retention of urine: Secondary | ICD-10-CM | POA: Diagnosis not present

## 2015-09-18 DIAGNOSIS — Z8546 Personal history of malignant neoplasm of prostate: Secondary | ICD-10-CM | POA: Diagnosis not present

## 2015-09-18 DIAGNOSIS — L598 Other specified disorders of the skin and subcutaneous tissue related to radiation: Secondary | ICD-10-CM | POA: Diagnosis not present

## 2015-09-18 DIAGNOSIS — N3041 Irradiation cystitis with hematuria: Secondary | ICD-10-CM | POA: Diagnosis not present

## 2015-09-18 NOTE — Progress Notes (Signed)
[name] ([id number]) Visit Report for 09/17/2015 HBO Details Patient Name: [name], [name] Date of Service: 09/17/2015 10:00 AM Medical Record Patient Account Number: [id number] Number: Treating RN: Afful, RN, BSN, Rita [birth year] [phone] y.o. 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 HBO Treatment Details Treatment Number: 12 Patient Type: Outpatient Chamber Type: Monoplace Chamber Serial #: [id number] Treatment Protocol: 2.0 ATA with 90 minutes oxygen, and no air breaks Treatment Details Compression Rate Down: 1.5 psi / minute De-Compression Rate Up: 1.5 psi / minute Air breaks and breathing Compress Tx Pressure periods Decompress Decompress Begins Reached (leave unused spaces Begins Ends blank) Chamber Pressure (ATA) 1 2 - - - - - - 2 1 Clock Time (24 hr) 09:55 10:06 - - - - - - 11:36 11:46 Treatment Length: 111 (minutes) Treatment Segments: 4 Capillary Blood Glucose Pre Capillary Blood Glucose (mg/dl): Post Capillary Blood Glucose (mg/dl): Vital Signs Capillary Blood Glucose Reference Range: 80 - 120 mg / dl HBO Diabetic Blood Glucose Intervention Range: <131 mg/dl or >249 mg/dl Time Vitals Blood Respiratory Capillary Blood Glucose Pulse Action Type: Pulse: Temperature: Taken: Pressure: Rate: Glucose (mg/dl): Meter #: Oximetry (%) Taken: Pre 09:25 148/86 72 16 98.4 Post 11:55 140/86 79 18 98.4 [name] ([id number]) Treatment Response Treatment Well Toleration: Treatment Treatment Completed without Adverse Event Completion Status: Physician Notes No concerns with  treatment given. Patient was seen for his routine visit HBO Attestation I certify that I supervised this HBO treatment in accordance with Medicare guidelines. A trained Yes emergency response team is readily available per hospital policies and procedures. Continue HBOT as ordered. Yes Electronic Signature(s) Signed: 09/18/2015 7:42:49 AM By: Linton Ham MD Previous Signature: 09/17/2015 5:39:43 PM Version By: Regan Lemming BSN, RN Entered By: Linton Ham on 09/17/2015 18:55:31 [name] ([id number]) -------------------------------------------------------------------------------- HBO Safety Checklist Details Patient Name: [name] Date of Service: 09/17/2015 10:00 AM Medical Record Patient Account Number: [id number] Number: Treating RN: Baruch Gouty, RN, BSN, Rita [birth year] [phone] y.o. Other Clinician: Jacqulyn Bath Date of Birth/Sex: Male) Treating ROBSON, [place] Primary Care Physician: Annye Asa Physician/Extender: G Referring Physician: Letha Cape in Treatment: 3 HBO Safety Checklist Items Safety Checklist Consent Form Signed Patient voided / foley secured and emptied When did you last eato 09/16/15 Last dose of injectable or oral agent not applicable NA Ostomy pouch emptied and vented if applicable NA All implantable devices assessed, documented and approved NA Intravenous access site secured and place Valuables secured Linens and cotton and cotton/polyester blend (less than 51% polyester) Personal oil-based products / skin lotions / body lotions removed NA Wigs or hairpieces removed NA Smoking or tobacco materials removed Books / newspapers / magazines / loose paper removed Cologne, aftershave, perfume and deodorant removed Jewelry removed (may wrap wedding band) NA Make-up removed NA Hair care products removed NA Battery operated devices (external) removed NA Heating patches and chemical warmers removed NA Titanium eyewear  removed NA Nail polish cured greater than 10 hours NA Casting material cured greater than 10 hours NA Hearing aids removed NA Loose dentures or partials removed NA Prosthetics have been removed NA Patient demonstrates correct use of air break device (if applicable) NA Patient concerns have been addressed NA Patient grounding bracelet on and  cord attached to chamber Specifics for Inpatients (complete in addition to above) NA Medication sheet sent with patient [name] ([id number]) NA Intravenous medications needed or due during therapy sent with patient Drainage tubes (e.g. nasogastric tube or chest tube secured and vented) Endotracheal or Tracheotomy tube secured Cuff deflated of air and inflated with saline Airway suctioned Electronic Signature(s) Signed: 09/17/2015 5:39:43 PM By: Regan Lemming BSN, RN Entered By: Regan Lemming on 09/17/2015 10:17:11

## 2015-09-18 NOTE — Progress Notes (Signed)
[name], [name] ([id number]) Visit Report for 09/17/2015 Chief Complaint Document Details Patient Name: [name] Date of Service: 09/17/2015 9:30 AM Medical Record Patient Account Number: [id number] Number: Treating RN: Baruch Gouty, RN, BSN, Rita [birth year] [phone] y.o. Other Clinician: Date of Birth/Sex: Male) Treating Atom Solivan Primary Care Physician: Annye Asa Physician/Extender: G Referring Physician: Letha Cape in Treatment: 3 Information Obtained from: Patient Chief Complaint 08/26/15; patient is referred for consideration of hyperbaric oxygen treatment secondary to radiation cystitis Electronic Signature(s) Signed: 09/18/2015 7:42:49 AM By: [name] Ham MD Entered By: [name] Ham on 09/17/2015 12:55:53 [name], [name] ([id number]) -------------------------------------------------------------------------------- HPI Details Patient Name: [name] Date of Service: 09/17/2015 9:30 AM Medical Record Patient Account Number: [id number] Number: Treating RN: Baruch Gouty, RN, BSN, Rita 1955/07/16 [phone] y.o. Other Clinician: Date of Birth/Sex: Male) Treating Osborn Pullin Primary Care Physician: Annye Asa Physician/Extender: G Referring Physician: Letha Cape in Treatment: 3 History of Present Illness HPI Description: 08/26/15; this is a patient who is been referred from his urologist Dr. 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 The patient was diagnosed as having adenocarcinoma of the prostate T-2 B N0 M0 Gleason score of 7 in 2013. This was discovered based on a screening PSA of 7.5, the patient was not symptomatic at that time he tells me. He elected to to undergo combination therapy. He received 4500 cGy terminal beam radiation over 5 weeks followed by I/125 seed implantation. Total of 57 seeds were placed with a total mCi dosage of 19.9 mCi.  He also received anti androgen therapy. The patient tells me he did quite well and really was asymptomatic until roughly 3-4 weeks ago when he developed acute urinary retention vomiting a trip to the emergency room. Apparently prior to the urinary retention he had passed a few small clots in his urine. He required a catheter placement on July 29. Bladder scan at that time showed greater than 500 cc of urine in his bladder. The catheter was removed however the patient could not void. He has since been doing in and out catheterizations. 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 Erythematous patches were noted within the bladder wall compatible with radiation cystitis. Urinalysis was obtained I don't have these results. The patient was started on Repaflo to help with the retention. He is still having some frank bleeding this morning according to his wife. He feels as though his spontaneous voiding is improving however he is still doing when necessary in and out caths 09/17/15; the patient is seen today for follow-up visit in conjunction with hyperbaric oxygen treatment for late effect radiation cystitis. He states after starting hyperbarics last week I believe he had what he felt was improved urine flow. However over the weekend he had a problem. Staining on 4:00 he did an in and out catheter for 400 cc of clear urine. 4 hours later he didn't another in and out catheter for grossly bloody urine. Later that evening he had to void but he could not because of blood clots and ended up in the emergency room where he had a painful set of procedures to put a catheter in place ultimately done by urology. He now has a indwelling Foley catheter, still passing some bloody urine and some blood clots in the leg bag. He was able to show this to me today. He has a follow-up with his urologist on Monday Electronic Signature(s) Signed: 09/18/2015 7:42:49 AM By:  [name] Ham  MD Entered By: [name] Ham on 09/17/2015 12:57:49 [name], [name] ([id number]) -------------------------------------------------------------------------------- Physical Exam Details Patient Name: [name] Date of Service: 09/17/2015 9:30 AM Medical Record Patient Account Number: [id number] Number: Treating RN: Afful, RN, BSN, Rita 09/08/1955 [phone] y.o. Other Clinician: Date of Birth/Sex: Male) Treating Latreece Mochizuki Primary Care Physician: Annye Asa Physician/Extender: G Referring Physician: Letha Cape in Treatment: 3 Constitutional Patient is hypertensive.. Pulse regular and within target range for patient.Marland Kitchen Respirations regular, non-labored and within target range.. Temperature is normal and within the target range for the patient.. Patient's appearance is neat and clean. Appears in no acute distress. Well nourished and well developed.. Notes GU; his bladder is not distended. Leg bag is filled with some faintly bloody urine. Blood clots noted when he empties the bag. There is no tenderness Electronic Signature(s) Signed: 09/18/2015 7:42:49 AM By: [name] Ham MD Entered By: [name] Ham on 09/17/2015 12:58:58 [name] ([id number]) -------------------------------------------------------------------------------- Physician Orders Details Patient Name: [name] Date of Service: 09/17/2015 9:30 AM Medical Record Patient Account Number: [id number] Number: Treating RN: [name] Jun 15, 1955 (60 y.o. Other Clinician: Date of Birth/Sex: Male) Treating Channon Brougher Primary Care Physician: Annye Asa Physician/Extender: G Referring Physician: Letha Cape in Treatment: 3 Verbal / Phone Orders: Yes Clinician: Cornell [name] Read Back and Verified: Yes Diagnosis Coding Follow-up Appointments o Return Appointment in 2 weeks. Hyperbaric Oxygen Therapy o Indication: - Radiation Cystitis o If  appropriate for treatment, begin HBOT per protocol: o 2.0 ATA for 90 Minutes without Air Breaks o One treatment per day (delivered Monday through Friday unless otherwise specified in Special Instructions below): o Total # of Treatments: - 40 Electronic Signature(s) Signed: 09/17/2015 4:41:27 PM By: [name] Cool RN, BSN, Kim RN, BSN Signed: 09/18/2015 7:42:49 AM By: [name] Ham MD Entered By: [name] Cool, RN, BSN, Kim on 09/17/2015 09:34:23 Reddell, [name] ([id number]) -------------------------------------------------------------------------------- Problem List Details Patient Name: [name] Date of Service: 09/17/2015 9:30 AM Medical Record Patient Account Number: [id number] Number: Treating RN: Baruch Gouty, RN, BSN, Rita 07-02-1955 [phone] y.o. Other Clinician: Date of Birth/Sex: Male) Treating Josejulian Tarango Primary Care Physician: Annye Asa Physician/Extender: G Referring Physician: Letha Cape in Treatment: 3 Active Problems ICD-10 Encounter Code Description Active Date Diagnosis L59.8 Other specified disorders of the skin and subcutaneous 08/26/2015 Yes tissue related to radiation N30.41 Irradiation cystitis with hematuria 08/26/2015 Yes R33.8 Other retention of urine 08/26/2015 Yes Z85.46 Personal history of malignant neoplasm of prostate 08/26/2015 Yes Inactive Problems Resolved Problems Electronic Signature(s) Signed: 09/18/2015 7:42:49 AM By: [name] Ham MD Entered By: [name] Ham on 09/17/2015 12:55:41 Putt, [name] ([id number]) -------------------------------------------------------------------------------- Progress Note Details Patient Name: [name] Date of Service: 09/17/2015 9:30 AM Medical Record Patient Account Number: [id number] Number: Treating RN: Baruch Gouty, RN, BSN, Rita 06-Aug-1955 [phone] y.o. Other Clinician: Date of Birth/Sex: Male) Treating [name] Primary Care Physician: Annye Asa Physician/Extender: G Referring Physician: Letha Cape in Treatment: 3 Subjective Chief Complaint Information obtained from Patient 08/26/15; patient is referred for consideration of hyperbaric oxygen treatment secondary to radiation cystitis History of Present Illness (HPI) 08/26/15; this is a patient who is been referred from his urologist Dr. 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 The patient was diagnosed as having adenocarcinoma of the prostate T-2 B N0 M0 Gleason score of 7 in 2013. This was discovered based on a screening PSA of 7.5, the patient was not symptomatic at that time he tells me.  He elected to to undergo combination therapy. He received 4500 cGy terminal beam radiation over 5 weeks followed by I/125 seed implantation. Total of 57 seeds were placed with a total mCi dosage of 19.9 mCi. He also received anti androgen therapy. The patient tells me he did quite well and really was asymptomatic until roughly 3-4 weeks ago when he developed acute urinary retention vomiting a trip to the emergency room. Apparently prior to the urinary retention he had passed a few small clots in his urine. He required a catheter placement on July 29. Bladder scan at that time showed greater than 500 cc of urine in his bladder. The catheter was removed however the patient could not void. He has since been doing in and out catheterizations. 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 Erythematous patches were noted within the bladder wall compatible with radiation cystitis. Urinalysis was obtained I don't have these results. The patient was started on Repaflo to help with the retention. He is still having some frank bleeding this morning according to his wife. He feels as though his spontaneous voiding is improving however he is still doing when necessary in and out  caths 09/17/15; the patient is seen today for follow-up visit in conjunction with hyperbaric oxygen treatment for late effect radiation cystitis. He states after starting hyperbarics last week I believe he had what he felt was improved urine flow. However over the weekend he had a problem. Staining on 4:00 he did an in and out catheter for 400 cc of clear urine. 4 hours later he didn't another in and out catheter for grossly bloody urine. Later that evening he had to void but he could not because of blood clots and ended up in the emergency room where he had a painful set of procedures to put a catheter in place ultimately done by urology. He now has a indwelling Foley catheter, still passing some bloody urine and some blood clots in the leg bag. He was able to show this to me today. He has a follow-up with his urologist on Monday [name], [name] ([id number]) Objective Constitutional Patient is hypertensive.. Pulse regular and within target range for patient.Marland Kitchen Respirations regular, non-labored and within target range.. Temperature is normal and within the target range for the patient.. Patient's appearance is neat and clean. Appears in no acute distress. Well nourished and well developed.. Vitals Time Taken: 9:25 AM, Height: 65 in, Weight: 162 lbs, BMI: 27, Temperature: 98.4 F, Pulse: 72 bpm, Respiratory Rate: 16 breaths/min, Blood Pressure: 148/86 mmHg. General Notes: GU; his bladder is not distended. Leg bag is filled with some faintly bloody urine. Blood clots noted when he empties the bag. There is no tenderness Assessment Active Problems ICD-10 L59.8 - Other specified disorders of the skin and subcutaneous tissue related to radiation N30.41 - Irradiation cystitis with hematuria R33.8 - Other retention of urine Z85.46 - Personal history of malignant neoplasm of prostate Plan Follow-up Appointments: Return Appointment in 2 weeks. Hyperbaric Oxygen Therapy: Indication: - Radiation  Cystitis If appropriate for treatment, begin HBOT per protocol: 2.0 ATA for 90 Minutes without Air Breaks One treatment per day (delivered Monday through Friday unless otherwise specified in Special Instructions below): Total # of Treatments: - [name], [name] ([id number]) He will continue with hyperbaric oxygen. He has a follow-up appointment with his treating urologist on Monday. Electronic Signature(s) Signed: 09/18/2015 7:42:49 AM By: [name] Ham MD Entered By: [name] Ham on 09/17/2015 12:59:24 [name], [name] ([id number]) --------------------------------------------------------------------------------  SuperBill Details Patient Name: [name], [name] Date of Service: 09/17/2015 Medical Record Patient Account Number: [id number] Number: Treating RN: Baruch Gouty, RN, BSN, Rita 11-03-1955 [phone] y.o. Other Clinician: Date of Birth/Sex: Male) Treating Naryiah Schley, [place] Primary Care Physician: Annye Asa Physician/Extender: G Referring Physician: Letha Cape in Treatment: 3 Diagnosis Coding ICD-10 Codes Code Description L59.8 Other specified disorders of the skin and subcutaneous tissue related to radiation N30.41 Irradiation cystitis with hematuria R33.8 Other retention of urine Z85.46 Personal history of malignant neoplasm of prostate Facility Procedures CPT4 Code: [id number] Description: [id number] - WOUND CARE VISIT-LEV 1 EST PT Modifier: Quantity: 1 Physician Procedures CPT4: Description Modifier Quantity Code [id number] - WC PHYS LEVEL 2 - EST PT 25 1 ICD-10 Description Diagnosis L59.8 Other specified disorders of the skin and subcutaneous tissue related to radiation Electronic Signature(s) Signed: 09/18/2015 7:42:49 AM By: [name] Ham MD Entered By: [name] Ham on 09/17/2015 13:00:05

## 2015-09-18 NOTE — Progress Notes (Addendum)
[name] ([id number]) Visit Report for 09/17/2015 Arrival Information Details Patient Name: [name], [name] Date of Service: 09/17/2015 9:30 AM Medical Record Number: [id number] Patient Account Number: [id number] Date of Birth/Sex: 1955-06-04 (60 y.o. Male) Treating RN: [name] Primary Care Physician: [name] Other Clinician: Referring Physician: Annye [name] Treating Physician/Extender: [name] in Treatment: 3 Visit Information History Since Last Visit Added or deleted any medications: No Patient Arrived: Ambulatory Any new allergies or adverse reactions: No Arrival Time: 09:30 Had a fall or experienced change in No Accompanied By: self activities of daily living that may affect Transfer Assistance: None risk of falls: Patient Identification Verified: Yes Signs or symptoms of abuse/neglect since last No Secondary Verification Process Yes visito Completed: Hospitalized since last visit: No Patient Requires Transmission-Based No Pain Present Now: No Precautions: Patient Has Alerts: No Electronic Signature(s) Signed: 09/17/2015 4:41:27 PM By: [name] Cool, RN, BSN, Kim RN, BSN Entered By: [name] Cool, RN, BSN, [name] on 09/17/2015 09:31:16 [place], [name] ([id number]) -------------------------------------------------------------------------------- Clinic Level of Care Assessment Details Patient Name: [name] Date of Service: 09/17/2015 9:30 AM Medical Record Number: [id number] Patient Account Number: [id number] Date of Birth/Sex: May 07, 1955 (60 y.o. Male) Treating RN: [name] Primary Care Physician: [name] Other Clinician: Referring Physician: Annye [name] Treating Physician/Extender: [name] in Treatment: 3 Clinic Level of Care Assessment Items TOOL 4 Quantity Score []  - Use when only an EandM is performed on FOLLOW-UP visit 0 ASSESSMENTS - Nursing Assessment / Reassessment []  - Reassessment of  Co-morbidities (includes updates in patient status) 0 X - Reassessment of Adherence to Treatment Plan 1 5 ASSESSMENTS - Wound and Skin Assessment / Reassessment []  - Simple Wound Assessment / Reassessment - one wound 0 []  - Complex Wound Assessment / Reassessment - multiple wounds 0 []  - Dermatologic / Skin Assessment (not related to wound area) 0 ASSESSMENTS - Focused Assessment []  - Circumferential Edema Measurements - multi extremities 0 []  - Nutritional Assessment / Counseling / Intervention 0 []  - Lower Extremity Assessment (monofilament, tuning fork, pulses) 0 []  - Peripheral Arterial Disease Assessment (using hand held doppler) 0 ASSESSMENTS - Ostomy and/or Continence Assessment and Care []  - Incontinence Assessment and Management 0 []  - Ostomy Care Assessment and Management (repouching, etc.) 0 PROCESS - Coordination of Care []  - Simple Patient / Family Education for ongoing care 0 []  - Complex (extensive) Patient / Family Education for ongoing care 0 []  - Staff obtains Programmer, systems, Records, Test Results / Process Orders 0 []  - Staff telephones HHA, Nursing Homes / Clarify orders / etc 0 []  - Routine Transfer to another Facility (non-emergent condition) 0 [place], [name] ([id number]) []  - Routine Hospital Admission (non-emergent condition) 0 []  - New Admissions / Biomedical engineer / Ordering NPWT, Apligraf, etc. 0 []  - Emergency Hospital Admission (emergent condition) 0 []  - Simple Discharge Coordination 0 []  - Complex (extensive) Discharge Coordination 0 PROCESS - Special Needs []  - Pediatric / Minor Patient Management 0 []  - Isolation Patient Management 0 []  - Hearing / Language / Visual special needs 0 []  - Assessment of Community assistance (transportation, D/C planning, etc.) 0 []  - Additional assistance / Altered mentation 0 []  - Support Surface(s) Assessment (bed, cushion, seat, etc.) 0 INTERVENTIONS - Wound Cleansing / Measurement []  - Simple Wound Cleansing - one  wound 0 []  - Complex Wound Cleansing - multiple wounds 0 []  - Wound Imaging (photographs - any number of wounds) 0 []  - Wound Tracing (instead of photographs) 0 []  - Simple Wound Measurement -  one wound 0 []  - Complex Wound Measurement - multiple wounds 0 INTERVENTIONS - Wound Dressings []  - Small Wound Dressing one or multiple wounds 0 []  - Medium Wound Dressing one or multiple wounds 0 []  - Large Wound Dressing one or multiple wounds 0 []  - Application of Medications - topical 0 []  - Application of Medications - injection 0 INTERVENTIONS - Miscellaneous []  - External ear exam 0 [name] ([id number]) []  - Specimen Collection (cultures, biopsies, blood, body fluids, etc.) 0 []  - Specimen(s) / Culture(s) sent or taken to Lab for analysis 0 []  - Patient Transfer (multiple staff / [name] / Similar devices) 0 []  - Simple Staple / Suture removal (25 or less) 0 []  - Complex Staple / Suture removal (26 or more) 0 []  - Hypo / Hyperglycemic Management (close monitor of Blood Glucose) 0 []  - Ankle / Brachial Index (ABI) - do not check if billed separately 0 []  - Vital Signs 0 Has the patient been seen at the hospital within the last three years: Yes Total Score: 5 Level Of Care: New/Established - Level 1 Electronic Signature(s) Signed: 09/17/2015 4:41:27 PM By: [name] Cool, RN, BSN, Kim RN, BSN Entered By: [name] Cool, RN, BSN, [name] on 09/17/2015 09:36:19 [name], [place] ([id number]) -------------------------------------------------------------------------------- Encounter Discharge Information Details Patient Name: [name] Date of Service: 09/17/2015 9:30 AM Medical Record Number: [id number] Patient Account Number: [id number] Date of Birth/Sex: 02/25/1955 (60 y.o. Male) Treating RN: [name] Primary Care Physician: [name] Other Clinician: Referring Physician: Annye [name] Treating Physician/Extender: [name] in Treatment: 3 Encounter Discharge  Information Items Discharge Condition: Stable Ambulatory Status: Ambulatory Discharge Destination: Home Private Transportation: Auto Accompanied By: self Schedule Follow-up Appointment: Yes Medication Reconciliation completed and No provided to Patient/Care Shaunika Italiano: Clinical Summary of Care: Notes Patient has an HBO treatment scheduled today following MD consult. Electronic Signature(s) Signed: 09/17/2015 4:41:27 PM By: [name] Cool, RN, BSN, Kim RN, BSN Entered By: [name] Cool, RN, BSN, [name] on 09/17/2015 09:38:01 [place], [place] ([id number]) -------------------------------------------------------------------------------- General Visit Notes Details Patient Name: [name] Date of Service: 09/17/2015 9:30 AM Medical Record Number: [id number] Patient Account Number: [id number] Date of Birth/Sex: 10-Oct-1955 (60 y.o. Male) Treating RN: [name] Primary Care Physician: [name] Other Clinician: Referring Physician: Annye [name] Treating Physician/Extender: [name] in Treatment: 3 Notes Patient was in the ED Sunday and had a cystoscopy due to urinary retention. Has 10 French foley inserted at this time. Patient has follow-up appointment on Monday at 8:30am with Dr. Rogers Blocker. Electronic Signature(s) Signed: 09/17/2015 4:41:27 PM By: [name] Cool, RN, BSN, Kim RN, BSN Entered By: [name] Cool, RN, BSN, [name] on 09/17/2015 09:41:55 [place], [place] ([id number]) -------------------------------------------------------------------------------- Multi Wound Chart Details Patient Name: [name] Date of Service: 09/17/2015 9:30 AM Medical Record Number: [id number] Patient Account Number: [id number] Date of Birth/Sex: 1955-05-07 (60 y.o. Male) Treating RN: [name] Primary Care Physician: [name] Other Clinician: Referring Physician: Annye [name] Treating Physician/Extender: Ricard Dillon Weeks in Treatment: 3 Vital Signs Height(in): 65 Pulse(bpm):  72 Weight(lbs): 162 Blood Pressure 148/86 (mmHg): Body Mass Index(BMI): 27 Temperature(F): 98.4 Respiratory Rate 16 (breaths/min): Wound Assessments Treatment Notes Electronic Signature(s) Signed: 09/17/2015 4:41:27 PM By: [name] Cool, RN, BSN, Kim RN, BSN Entered By: [name] Cool, RN, BSN, [name] on 09/17/2015 09:32:46 [place], Klyde ([id number]) -------------------------------------------------------------------------------- [place] Details Patient Name: [name] Date of Service: 09/17/2015 9:30 AM Medical Record Number: [id number] Patient Account Number: [id number] Date of Birth/Sex: Dec 14, 1955 (60 y.o. Male) Treating RN: [name] Primary Care Physician: [name] Other  Clinician: Referring Physician: Annye [name] Treating Physician/Extender: [name] in Treatment: 3 Active Inactive Electronic Signature(s) Signed: 11/05/2015 1:18:23 PM By: [name] Cool RN, BSN, Kim RN, BSN Previous Signature: 09/17/2015 4:41:27 PM Version By: [name] Cool RN, BSN, Kim RN, BSN Entered By: [name] Cool, RN, BSN, [name] on 11/05/2015 13:18:23 [place], [place] ([id number]) -------------------------------------------------------------------------------- Pain Assessment Details Patient Name: [name] Date of Service: 09/17/2015 9:30 AM Medical Record Number: [id number] Patient Account Number: [id number] Date of Birth/Sex: Jun 19, 1955 (60 y.o. Male) Treating RN: [name] Primary Care Physician: [name] Other Clinician: Referring Physician: Annye [name] Treating Physician/Extender: [name] in Treatment: 3 Active Problems Location of Pain Severity and Description of Pain Patient Has Paino No Site Locations With Dressing Change: No Pain Management and Medication Current Pain Management: Electronic Signature(s) Signed: 09/17/2015 4:41:27 PM By: [name] Cool, RN, BSN, Kim RN, BSN Entered By: [name] Cool, RN, BSN, [name] on 09/17/2015 09:31:24 [name] ([id number]) -------------------------------------------------------------------------------- Patient/Caregiver Education Details Patient Name: [name] Date of Service: 09/17/2015 9:30 AM Medical Record Number: [id number] Patient Account Number: [id number] Date of Birth/Gender: 1955-05-11 (60 y.o. Male) Treating RN: [name] Primary Care Physician: [name] Other Clinician: Referring Physician: Annye [name] Treating Physician/Extender: [name] in Treatment: 3 Education Assessment Education Provided To: Patient Education Topics Provided Hyperbaric Oxygenation: Handouts: Hyperbaric Oxygen, Other: continue HBo treatments as prescribed Methods: Explain/Verbal Responses: State content correctly Electronic Signature(s) Signed: 09/17/2015 4:41:27 PM By: [name] Cool, RN, BSN, Kim RN, BSN Entered By: [name] Cool, RN, BSN, [name] on 09/17/2015 09:38:24 [name] ([id number]) -------------------------------------------------------------------------------- [place] Details Patient Name: [name] Date of Service: 09/17/2015 9:30 AM Medical Record Number: [id number] Patient Account Number: [id number] Date of Birth/Sex: 1955-12-23 (59 y.o. Male) Treating RN: [name] Primary Care Physician: [name] Other Clinician: Referring Physician: Annye [name] Treating Physician/Extender: [name] in Treatment: 3 Vital Signs Time Taken: 09:25 Temperature (F): 98.4 Height (in): 65 Pulse (bpm): 72 Weight (lbs): 162 Respiratory Rate (breaths/min): 16 Body Mass Index (BMI): 27 Blood Pressure (mmHg): 148/86 Reference Range: 80 - 120 mg / dl Electronic Signature(s) Signed: 09/17/2015 4:41:27 PM By: [name] Cool, RN, BSN, Kim RN, BSN Entered By: [name] Cool, RN, BSN, [name] on 09/17/2015 09:32:16

## 2015-09-18 NOTE — Progress Notes (Signed)
GOTTLIEB, HARTMAN ([id number]) Visit Report for 09/17/2015 Arrival Information Details Patient Name: [name], [name] Date of Service: 09/17/2015 10:00 AM Medical Record Patient Account Number: [id number] Number: Treating RN: Afful, RN, BSN, [name] Oct 29, 1955 [phone] y.o. Other Clinician: Jacqulyn [name] Date of Birth/Sex: Male) Treating [name] [place] Primary Care Physician: [name] Physician/Extender: G Referring Physician: Letha [name] in Treatment: 3 Visit Information History Since Last Visit All ordered tests and consults were completed: No Patient Arrived: Ambulatory Added or deleted any medications: No Arrival Time: 09:25 Any new allergies or adverse reactions: No Accompanied By: self Had a fall or experienced change in No Transfer Assistance: None activities of daily living that may affect Patient Identification Verified: Yes risk of falls: Secondary Verification Process Yes Signs or symptoms of abuse/neglect since last No Completed: visito Patient Requires Transmission-Based No Hospitalized since last visit: No Precautions: Pain Present Now: No Patient Has Alerts: No Electronic Signature(s) Signed: 09/17/2015 5:39:43 PM By: [name] BSN, RN Entered By: [name] on 09/17/2015 10:14:27 [name] ([id number]) -------------------------------------------------------------------------------- Encounter Discharge Information Details Patient Name: [name] Date of Service: 09/17/2015 10:00 AM Medical Record Patient Account Number: [id number] Number: Treating RN: [name] Gouty, RN, BSN, [name] Aug 18, 1955 [phone] y.o. Other Clinician: Jacqulyn [name] Date of Birth/Sex: Male) Treating [name] [place] Primary Care Physician: [name] Physician/Extender: G Referring Physician: Letha [name] in Treatment: 3 Encounter Discharge Information Items Discharge Pain Level: 0 Discharge Condition: Stable Ambulatory Status:  Ambulatory Discharge Destination: Home Private Transportation: Auto Accompanied By: self Schedule Follow-up Appointment: No Medication Reconciliation completed and No provided to Patient/Care Yerlin Gasparyan: Clinical Summary of Care: Electronic Signature(s) Signed: 09/17/2015 5:39:43 PM By: [name] BSN, RN Entered By: [name] on 09/17/2015 12:08:44 [name] ([id number]) -------------------------------------------------------------------------------- Patient/Caregiver Education Details Patient Name: [name] Date of Service: 09/17/2015 10:00 AM Medical Record Patient Account Number: [id number] Number: Treating RN: [name] Gouty, RN, BSN, [name] March 11, 1955 [phone] y.o. Other Clinician: Jacqulyn [name] Date of Birth/Gender: Male) Treating [name] Primary Care Physician: [name] Physician/Extender: G Referring Physician: Letha [name] in Treatment: 3 Education Assessment Education Provided To: Patient Education Topics Provided Basic Hygiene: Methods: Explain/Verbal Responses: State content correctly Welcome To [facility]: Methods: Explain/Verbal Responses: State content correctly Electronic Signature(s) Signed: 09/17/2015 5:39:43 PM By: [name] BSN, RN Entered By: [name] on 09/17/2015 12:08:23 [name] ([id number]) -------------------------------------------------------------------------------- [place] Details Patient Name: [name] Date of Service: 09/17/2015 10:00 AM Medical Record Patient Account Number: [id number] Number: Treating RN: [name] Gouty, RN, BSN, [name] 05-02-1955 [phone] y.o. Other Clinician: Jacqulyn [name] Date of Birth/Sex: Male) Treating [name] Primary Care Physician: [name] Physician/Extender: G Referring Physician: Letha [name] in Treatment: 3 Vital Signs Time Taken: 09:25 Temperature (F): 98.4 Height (in): 65 Pulse (bpm): 72 Weight (lbs):  162 Respiratory Rate (breaths/min): 16 Body Mass Index (BMI): 27 Blood Pressure (mmHg): 148/86 Reference Range: 80 - 120 mg / dl Electronic Signature(s) Signed: 09/17/2015 5:39:43 PM By: [name] BSN, RN Entered By: [name] on 09/17/2015 10:14:52

## 2015-09-18 NOTE — Progress Notes (Signed)
VIKASH, STENMAN ([id number]) Visit Report for 09/18/2015 Arrival Information Details Patient Name: [name], [name] Date of Service: 09/18/2015 10:00 AM Medical Record Number: [id number] Patient Account Number: [id number] Date of Birth/Sex: 06-04-1955 (60 y.o. Male) Treating RN: Primary Care Physician: Annye Asa Other Clinician: Jacqulyn Bath Referring Physician: Annye Asa Treating Physician/Extender: Frann Rider in Treatment: 3 Visit Information History Since Last Visit Added or deleted any medications: No Patient Arrived: Ambulatory Any new allergies or adverse reactions: No Arrival Time: 09:25 Had a fall or experienced change in No Accompanied By: self activities of daily living that may affect Transfer Assistance: None risk of falls: Patient Identification Verified: Yes Signs or symptoms of abuse/neglect since last No Secondary Verification Process Yes visito Completed: Hospitalized since last visit: No Patient Requires Transmission-Based No Pain Present Now: No Precautions: Patient Has Alerts: No Electronic Signature(s) Signed: 09/18/2015 4:08:57 PM By: Lorine Bears RCP, RRT, CHT Entered By: Lorine Bears on 09/18/2015 09:29:59 [place], [place] ([id number]) -------------------------------------------------------------------------------- Encounter Discharge Information Details Patient Name: [name] Date of Service: 09/18/2015 10:00 AM Medical Record Number: [id number] Patient Account Number: [id number] Date of Birth/Sex: [birth year] (60 y.o. Male) Treating RN: Primary Care Physician: Annye Asa Other Clinician: Jacqulyn Bath Referring Physician: Annye Asa Treating Physician/Extender: Frann Rider in Treatment: 3 Encounter Discharge Information Items Discharge Pain Level: 0 Discharge Condition: Stable Ambulatory Status: Ambulatory Discharge Destination:  Home Private Transportation: Auto Accompanied By: self Schedule Follow-up Appointment: No Medication Reconciliation completed and No provided to Patient/Care Anapaola Kinsel: Clinical Summary of Care: Notes Patient has an HBO treatment scheduled on 09/19/15 at 10:00 am. Electronic Signature(s) Signed: 09/18/2015 4:08:57 PM By: Lorine Bears RCP, RRT, CHT Entered By: Lorine Bears on 09/18/2015 11:40:23 [place], [place] ([id number]) -------------------------------------------------------------------------------- [place] Details Patient Name: [name] Date of Service: 09/18/2015 10:00 AM Medical Record Number: [id number] Patient Account Number: [id number] Date of Birth/Sex: 02/04/1955 (60 y.o. Male) Treating RN: Primary Care Physician: Annye Asa Other Clinician: Jacqulyn Bath Referring Physician: Annye Asa Treating Physician/Extender: Frann Rider in Treatment: 3 Vital Signs Time Taken: 09:25 Temperature (F): 98.3 Height (in): 65 Pulse (bpm): 72 Weight (lbs): 162 Respiratory Rate (breaths/min): 16 Body Mass Index (BMI): 27 Blood Pressure (mmHg): 128/86 Reference Range: 80 - 120 mg / dl Electronic Signature(s) Signed: 09/18/2015 4:08:57 PM By: Lorine Bears RCP, RRT, CHT Entered By: Becky Sax, Amado Nash on 09/18/2015 09:41:02

## 2015-09-18 NOTE — Progress Notes (Signed)
BRODIN, ALSOP ([id number]) Visit Report for 09/18/2015 HBO Details Patient Name: [name], [name] Date of Service: 09/18/2015 10:00 AM Medical Record Number: [id number] Patient Account Number: [id number] Date of Birth/Sex: 29-Aug-1955 (60 y.o. 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 HBO Treatment Details Treatment Number: 13 Patient Type: Outpatient Chamber Type: Monoplace Chamber Serial #: [id number] Treatment Protocol: 2.0 ATA with 90 minutes oxygen, and no air breaks Treatment Details Compression Rate Down: 1.5 psi / minute De-Compression Rate Up: 1.5 psi / minute Air breaks and breathing Compress Tx Pressure periods Decompress Decompress Begins Reached (leave unused spaces Begins Ends blank) Chamber Pressure (ATA) 1 2 - - - - - - 2 1 Clock Time (24 hr) 09:36 09:48 - - - - - - 11:18 11:30 Treatment Length: 114 (minutes) Treatment Segments: 4 Capillary Blood Glucose Pre Capillary Blood Glucose (mg/dl): Post Capillary Blood Glucose (mg/dl): Vital Signs Capillary Blood Glucose Reference Range: 80 - 120 mg / dl HBO Diabetic Blood Glucose Intervention Range: <131 mg/dl or >249 mg/dl Time Vitals Blood Respiratory Capillary Blood Glucose Pulse Action Type: Pulse: Temperature: Taken: Pressure: Rate: Glucose (mg/dl): Meter #: Oximetry (%) Taken: Pre 09:25 128/86 72 16 98.3 Post 11:33 138/92 72 16 98.3 Treatment Response Treatment Completion Status: Treatment Completed without Adverse Event [name], [name] ([id number]) HBO Attestation I certify that I supervised this HBO treatment in accordance with Medicare  guidelines. A trained Yes emergency response team is readily available per hospital policies and procedures. Continue HBOT as ordered. Yes Electronic Signature(s) Signed: 09/18/2015 12:44:49 PM By: Christin Fudge MD, FACS Entered By: Christin Fudge on 09/18/2015 12:44:48 [name], [name] ([id number]) -------------------------------------------------------------------------------- HBO Safety Checklist Details Patient Name: [name] Date of Service: 09/18/2015 10:00 AM Medical Record Number: [id number] Patient Account Number: [id number] Date of Birth/Sex: Apr 24, 1955 (60 y.o. Male) Treating RN: Primary Care Physician: Annye Asa Other Clinician: Jacqulyn Bath Referring Physician: Annye Asa Treating Physician/Extender: Frann Rider in Treatment: 3 HBO Safety Checklist Items Safety Checklist Consent Form Signed Patient voided / foley secured and emptied When did you last eato 09/17/15 pm Last dose of injectable or oral agent n/a NA Ostomy pouch emptied and vented if applicable NA All implantable devices assessed, documented and approved NA Intravenous access site secured and place Valuables secured Linens and cotton and cotton/polyester blend (less than 51% polyester) Personal oil-based products / skin lotions / body lotions removed NA Wigs or hairpieces removed NA Smoking or tobacco materials removed Books / newspapers / magazines / loose paper removed Cologne, aftershave, perfume and deodorant removed NA Jewelry removed (may wrap wedding band) NA Make-up removed NA Hair care products removed Battery operated devices (external) removed Heating patches and chemical warmers removed NA Titanium eyewear removed NA Nail polish cured greater than 10 hours NA Casting material cured greater than 10 hours NA Hearing aids removed Loose dentures or partials removed NA Prosthetics have been removed Patient demonstrates correct use of air break device (if  applicable) Patient concerns have been addressed Patient grounding bracelet on and cord attached to chamber Specifics for Inpatients (complete in addition to above) Medication sheet sent with patient Intravenous medications needed or due during therapy sent with patient [name], [name] ([id number]) Drainage tubes (e.g. nasogastric tube or chest tube secured and vented) Endotracheal  or Tracheotomy tube secured Cuff deflated of air and inflated with saline Airway suctioned Electronic Signature(s) Signed: 09/18/2015 4:08:57 PM By: Lorine Bears RCP, RRT, CHT Entered By: Becky Sax, Amado Nash on 09/18/2015 09:42:33

## 2015-09-19 ENCOUNTER — Encounter: Payer: Federal, State, Local not specified - PPO | Admitting: Surgery

## 2015-09-19 DIAGNOSIS — Z8546 Personal history of malignant neoplasm of prostate: Secondary | ICD-10-CM | POA: Diagnosis not present

## 2015-09-19 DIAGNOSIS — N3041 Irradiation cystitis with hematuria: Secondary | ICD-10-CM | POA: Diagnosis not present

## 2015-09-19 DIAGNOSIS — R338 Other retention of urine: Secondary | ICD-10-CM | POA: Diagnosis not present

## 2015-09-19 DIAGNOSIS — L598 Other specified disorders of the skin and subcutaneous tissue related to radiation: Secondary | ICD-10-CM | POA: Diagnosis not present

## 2015-09-19 NOTE — Progress Notes (Signed)
[name], [name] ([id number]) Visit Report for 09/19/2015 HBO Details Patient Name: [name], [name] Date of Service: 09/19/2015 10:00 AM Medical Record Number: [id number] Patient Account Number: [id number] Date of Birth/Sex: 05-07-1955 (60 y.o. 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 HBO Treatment Details Treatment Number: 14 Patient Type: Outpatient Chamber Type: Monoplace Chamber Serial #: [id number] Treatment Protocol: 2.0 ATA with 90 minutes oxygen, and no air breaks Treatment Details Compression Rate Down: 1.5 psi / minute De-Compression Rate Up: 1.5 psi / minute Air breaks and breathing Compress Tx Pressure periods Decompress Decompress Begins Reached (leave unused spaces Begins Ends blank) Chamber Pressure (ATA) 1 2 - - - - - - 2 1 Clock Time (24 hr) 09:36 09:48 - - - - - - 11:17 11:29 Treatment Length: 113 (minutes) Treatment Segments: 4 Capillary Blood Glucose Pre Capillary Blood Glucose (mg/dl): Post Capillary Blood Glucose (mg/dl): Vital Signs Capillary Blood Glucose Reference Range: 80 - 120 mg / dl HBO Diabetic Blood Glucose Intervention Range: <131 mg/dl or >249 mg/dl Time Vitals Blood Respiratory Capillary Blood Glucose Pulse Action Type: Pulse: Temperature: Taken: Pressure: Rate: Glucose (mg/dl): Meter #: Oximetry (%) Taken: Pre 09:25 150/80 72 16 98.6 Post 11:30 152/92 72 16 98.3 Treatment Response Treatment Completion Status: Treatment Completed without Adverse Event [name], [name] ([id number]) HBO Attestation I certify that I supervised this HBO treatment in accordance with Medicare  guidelines. A trained Yes emergency response team is readily available per hospital policies and procedures. Continue HBOT as ordered. Yes Electronic Signature(s) Signed: 09/19/2015 12:32:26 PM By: Christin Fudge MD, FACS Entered By: Christin Fudge on 09/19/2015 12:32:25 [name], [name] ([id number]) -------------------------------------------------------------------------------- HBO Safety Checklist Details Patient Name: [name] Date of Service: 09/19/2015 10:00 AM Medical Record Number: [id number] Patient Account Number: [id number] Date of Birth/Sex: [birth year] (60 y.o. Male) Treating RN: Primary Care Physician: Annye Asa Other Clinician: Jacqulyn Bath Referring Physician: Annye Asa Treating Physician/Extender: Frann Rider in Treatment: 3 HBO Safety Checklist Items Safety Checklist Consent Form Signed Patient voided / foley secured and emptied When did you last eato 09/18/15 pm Last dose of injectable or oral agent n/a NA Ostomy pouch emptied and vented if applicable NA All implantable devices assessed, documented and approved NA Intravenous access site secured and place Valuables secured Linens and cotton and cotton/polyester blend (less than 51% polyester) Personal oil-based products / skin lotions / body lotions removed NA Wigs or hairpieces removed NA Smoking or tobacco materials removed Books / newspapers / magazines / loose paper removed Cologne, aftershave, perfume and deodorant removed Jewelry removed (may wrap wedding band) NA Make-up removed NA Hair care products removed Battery operated devices (external) removed Heating patches and chemical warmers removed NA Titanium eyewear removed NA Nail polish cured greater than 10 hours NA Casting material cured greater than 10 hours NA Hearing aids removed Loose dentures or partials removed NA Prosthetics have been removed Patient demonstrates correct use of air break device (if  applicable) Patient concerns have been addressed Patient grounding bracelet on and cord attached to chamber Specifics for Inpatients (complete in addition to above) Medication sheet sent with patient Intravenous medications needed or due during therapy sent with patient [name], [name] ([id number]) Drainage tubes (e.g. nasogastric tube or chest tube secured and vented) Endotracheal or  Tracheotomy tube secured Cuff deflated of air and inflated with saline Airway suctioned Electronic Signature(s) Signed: 09/19/2015 2:24:29 PM By: Lorine Bears RCP, RRT, CHT Entered By: Becky Sax, Amado Nash on 09/19/2015 09:38:14

## 2015-09-19 NOTE — Progress Notes (Signed)
OR, RABELO ([id number]) Visit Report for 09/19/2015 Arrival Information Details Patient Name: [name], [name] Date of Service: 09/19/2015 10:00 AM Medical Record Number: [id number] Patient Account Number: [id number] Date of Birth/Sex: 27-Jul-1955 (60 y.o. Male) Treating RN: Primary Care Physician: Annye Asa Other Clinician: Jacqulyn Bath Referring Physician: Annye Asa Treating Physician/Extender: Frann Rider in Treatment: 3 Visit Information History Since Last Visit Added or deleted any medications: No Patient Arrived: Ambulatory Any new allergies or adverse reactions: No Arrival Time: 09:20 Had a fall or experienced change in No Accompanied By: self activities of daily living that may affect Transfer Assistance: None risk of falls: Patient Identification Verified: Yes Signs or symptoms of abuse/neglect since last No Secondary Verification Process Yes visito Completed: Hospitalized since last visit: No Patient Requires Transmission-Based No Pain Present Now: No Precautions: Patient Has Alerts: No Electronic Signature(s) Signed: 09/19/2015 2:24:29 PM By: Lorine Bears RCP, RRT, CHT Entered By: Becky Sax, Amado Nash on 09/19/2015 09:36:45 Lunt, Zackari ([id number]) -------------------------------------------------------------------------------- Encounter Discharge Information Details Patient Name: [name] Date of Service: 09/19/2015 10:00 AM Medical Record Number: [id number] Patient Account Number: [id number] Date of Birth/Sex: [birth year] (60 y.o. Male) Treating RN: Primary Care Physician: Annye Asa Other Clinician: Jacqulyn Bath Referring Physician: Annye Asa Treating Physician/Extender: Frann Rider in Treatment: 3 Encounter Discharge Information Items Discharge Pain Level: 0 Discharge Condition: Stable Ambulatory Status: Ambulatory Discharge Destination:  Home Private Transportation: Auto Accompanied By: self Schedule Follow-up Appointment: No Medication Reconciliation completed and No provided to Patient/Care Fritz Cauthon: Clinical Summary of Care: Notes Patient has an HBO treatment scheduled on 9/18/17at 10:00 am. Electronic Signature(s) Signed: 09/19/2015 2:24:29 PM By: Lorine Bears RCP, RRT, CHT Entered By: Lorine Bears on 09/19/2015 11:46:40 [place], Apollos ([id number]) -------------------------------------------------------------------------------- Vitals Details Patient Name: [name] Date of Service: 09/19/2015 10:00 AM Medical Record Number: [id number] Patient Account Number: [id number] Date of Birth/Sex: 07-05-1955 (60 y.o. Male) Treating RN: Primary Care Physician: Annye Asa Other Clinician: Jacqulyn Bath Referring Physician: Annye Asa Treating Physician/Extender: Frann Rider in Treatment: 3 Vital Signs Time Taken: 09:25 Temperature (F): 98.6 Height (in): 65 Pulse (bpm): 72 Weight (lbs): 162 Respiratory Rate (breaths/min): 16 Body Mass Index (BMI): 27 Blood Pressure (mmHg): 150/80 Reference Range: 80 - 120 mg / dl Electronic Signature(s) Signed: 09/19/2015 2:24:29 PM By: Lorine Bears RCP, RRT, CHT Entered By: Lorine Bears on 09/19/2015 09:37:13

## 2015-09-21 ENCOUNTER — Emergency Department
Admission: EM | Admit: 2015-09-21 | Discharge: 2015-09-21 | Disposition: A | Discharge status: Home / Self Care | Payer: Federal, State, Local not specified - PPO | Attending: Emergency Medicine | Admitting: Emergency Medicine

## 2015-09-21 ENCOUNTER — Encounter: Payer: Self-pay | Admitting: Emergency Medicine

## 2015-09-21 DIAGNOSIS — Z8546 Personal history of malignant neoplasm of prostate: Secondary | ICD-10-CM | POA: Diagnosis not present

## 2015-09-21 DIAGNOSIS — Y828 Other medical devices associated with adverse incidents: Secondary | ICD-10-CM | POA: Insufficient documentation

## 2015-09-21 DIAGNOSIS — Z79899 Other long term (current) drug therapy: Secondary | ICD-10-CM | POA: Insufficient documentation

## 2015-09-21 DIAGNOSIS — I10 Essential (primary) hypertension: Secondary | ICD-10-CM | POA: Diagnosis not present

## 2015-09-21 DIAGNOSIS — T83098A Other mechanical complication of other indwelling urethral catheter, initial encounter: Secondary | ICD-10-CM | POA: Diagnosis not present

## 2015-09-21 DIAGNOSIS — Z87891 Personal history of nicotine dependence: Secondary | ICD-10-CM | POA: Insufficient documentation

## 2015-09-21 DIAGNOSIS — R339 Retention of urine, unspecified: Secondary | ICD-10-CM | POA: Diagnosis present

## 2015-09-21 DIAGNOSIS — T83091A Other mechanical complication of indwelling urethral catheter, initial encounter: Secondary | ICD-10-CM

## 2015-09-21 NOTE — ED Triage Notes (Signed)
Patient was seen here here Sunday for urinary retention and hematuria. Patient had to be taken to the OR to have a foley catheter placed by urology. Patient is here tonight because his catheter has not been draining since 19:00. Patient with some small blood clots present in catheter bag.

## 2015-09-21 NOTE — ED Provider Notes (Signed)
[facility] Emergency Department Provider Note ____________________________________________  First MD Initiated Contact with Patient 09/21/15 2214   (approximate)  I have reviewed the triage vital signs and the nursing notes.   HISTORY  Chief Complaint Urinary Retention   HPI [name] is a 60 y.o. male with prostate cancer and radiation cystitis. He normally self catheterizes at home, but about one week ago he was unable to catheterize about seeing a large clots. He came to the ER and had to have a Foley placed by urology this demonstrated a false passage, but was able to be catheterized in the operating room.  Patient reports for the last 6 days been draining fairly clear urine, today began experiencing slight pink urine changing to [place], and then at about 7 PM he was unable to urinate any further.  Report significant pain like he can't empty his bladder. He has been seeing blood in the catheter now.  No fevers chills nausea or vomiting. No other symptoms. Reports there has not been any trauma to the catheter itself, it is not been pulled out.   Past Medical History:  Diagnosis Date  . Cancer ([place])   . Hyperlipidemia   . Hypertension     Patient Active Problem List   Diagnosis Date Noted  . Routine general medical examination at a health care facility 11/22/2012  . HTN (hypertension) 03/02/2012  . Hyperlipidemia 03/02/2012  . Prostate cancer ([place]) 03/02/2012    Past Surgical History:  Procedure Laterality Date  . APPENDECTOMY    . CYSTOSCOPY WITH FULGERATION N/A 08/19/2015   Procedure: CYSTOSCOPY WITH FULGERATION / CLOT EVACUATION;  Surgeon: Royston Cowper, MD;  Location: [facility] ORS;  Service: Urology;  Laterality: N/A;  . CYSTOSCOPY WITH FULGERATION N/A 09/14/2015   Procedure: CYSTOSCOPY WITH clot evacuation;  Surgeon: Darcella Cheshire, MD;  Location: [facility] ORS;  Service: Urology;  Laterality: N/A;    Prior to Admission medications     Medication Sig Start Date End Date Taking? Authorizing Provider  atorvastatin (LIPITOR) 10 MG tablet TAKE 1 TABLET (10 MG TOTAL) BY MOUTH DAILY. 05/19/15   Midge Minium, MD  docusate sodium (COLACE) 100 MG capsule Take 2 capsules (200 mg total) by mouth 2 (two) times daily. 08/19/15   Royston Cowper, MD  lisinopril (PRINIVIL,ZESTRIL) 40 MG tablet Take 1 tablet (40 mg total) by mouth daily. 05/19/15   Midge Minium, MD  nebivolol (BYSTOLIC) 5 MG tablet TAKE 1 TABLET (5 MG TOTAL) BY MOUTH DAILY. 05/05/15   Midge Minium, MD  NUCYNTA 50 MG tablet Take 1 tablet (50 mg total) by mouth every 6 (six) hours as needed. 1 TO 2 TABS Q 6 HOURS PRN PAIN 08/19/15   Royston Cowper, MD  silodosin (RAPAFLO) 8 MG CAPS capsule Take 8 mg by mouth daily with breakfast.    Historical Provider, MD  sulfamethoxazole-trimethoprim (BACTRIM DS,SEPTRA DS) 800-160 MG tablet Take 1 tablet by mouth 2 (two) times daily. 08/19/15   Royston Cowper, MD  tadalafil (CIALIS) 5 MG tablet TAKE 1 TABLET BY MOUTH DAILY FOR BPH 11/20/14   Midge Minium, MD  tolterodine (DETROL LA) 4 MG 24 hr capsule Take 1 capsule (4 mg total) by mouth daily. 08/19/15   Royston Cowper, MD  verapamil (VERELAN PM) 360 MG 24 hr capsule TAKE 1 CAPSULE (360 MG TOTAL) BY MOUTH DAILY. 08/04/15   Midge Minium, MD    Allergies Review of patient's allergies indicates no known  allergies.  Family History  Problem Relation Age of Onset  . Hypertension Mother   . Hypertension Father   . Hypertension Sister   . Hypertension Brother     Social History Social History  Substance Use Topics  . Smoking status: Former Smoker    Types: Cigarettes    Quit date: 09/24/2011  . Smokeless tobacco: Never Used  . Alcohol use 1.2 oz/week    2 Cans of beer per week    Review of Systems Constitutional: No fever/chills Eyes: No visual changes. ENT: No sore throat. Cardiovascular: Denies chest pain. Respiratory: Denies shortness of  breath. Gastrointestinal: No abdominal painExcept feeling can't empty his bladder.  No nausea, no vomiting.  No diarrhea.  No constipation. Genitourinary: Negative for dysuria. Musculoskeletal: Negative for back pain. Skin: Negative for rash. Neurological: Negative for headaches, focal weakness or numbness.  10-point ROS otherwise negative.  ____________________________________________   PHYSICAL EXAM:  VITAL SIGNS: ED Triage Vitals  Enc Vitals Group     BP 09/21/15 2125 (!) 186/91     Pulse Rate 09/21/15 2125 78     Resp 09/21/15 2125 18     Temp 09/21/15 2125 98.2 F (36.8 C)     Temp Source 09/21/15 2125 Oral     SpO2 09/21/15 2125 99 %     Weight 09/21/15 2125 162 lb (73.5 kg)     Height 09/21/15 2125 5\' 5"  (1.651 m)     Head Circumference --      Peak Flow --      Pain Score 09/21/15 2158 8     Pain Loc --      Pain Edu? --      Excl. in [place]? --     Constitutional: Alert and oriented. Well appearing and in no acute distressAside from stating it terrible feeling like he needs to urinate. Eyes: Conjunctivae are normal. PERRL. EOMI. Head: Atraumatic. Nose: No congestion/rhinnorhea. Mouth/Throat: Mucous membranes are moist.  Oropharynx non-erythematous. Neck: No stridor.   Cardiovascular: Normal rate, regular rhythm. Grossly normal heart sounds.  Good peripheral circulation. Respiratory: Normal respiratory effort.  No retractions. Lungs CTAB. Gastrointestinal: Soft and nontender. No distention. External penis normal. Urinary catheter present, and has a small amount of blood noted in the bag without any active urine drainage.  Musculoskeletal: No lower extremity tenderness nor edema.  No joint effusions. Neurologic:  Normal speech and language. No gross focal neurologic deficits are appreciated. No gait instability. Skin:  Skin is warm, dry and intact. No rash noted. Psychiatric: Mood and affect are normal. Speech and behavior are  normal.  ____________________________________________   LABS (all labs ordered are listed, but only abnormal results are displayed)  Labs Reviewed - No data to display ____________________________________________  EKG   ____________________________________________  RADIOLOGY   ____________________________________________   PROCEDURES  Procedure(s) performed: None  Procedures  Critical Care performed: No  ____________________________________________   INITIAL IMPRESSION / ASSESSMENT AND PLAN / ED COURSE  Pertinent labs & imaging results that were available during my care of the patient were reviewed by me and considered in my medical decision making (see chart for details).  Patient presents with what appears to be in obvious fully catheter obstruction. This occurred just recently, and after irrigation we're able to remove her makeup the clot, and the patient drained 600 mL's of yellow clear urine. Patient reports significant improvement in symptoms, no longer in pain.  Patient has an appointment at 830AM with Dr. Rogers Blocker tomorrow.  ----------------------------------------- 11:15 PM on  09/21/2015 -----------------------------------------  Spoke to Dr. Rogers Blocker, recommends leaving the catheter in and he will follow-up with him tomorrow in the clinic. He advises the patient to go to his hyperbaric appointment tomorrow morning, then give him a call and he will see him in the office after his hyperbaric treatment tomorrow. Patient comfortable with this plan.  Return precautions and treatment recommendations and follow-up discussed with the patient who is agreeable with the plan.   Clinical Course     ____________________________________________   FINAL CLINICAL IMPRESSION(S) / ED DIAGNOSES  Final diagnoses:  Obstructed Foley catheter, initial encounter ([place])      NEW MEDICATIONS STARTED DURING THIS VISIT:  New Prescriptions   No medications on file      Note:  This document was prepared using Dragon voice recognition software and may include unintentional dictation errors.     Delman Kitten, MD 09/21/15 [phone]

## 2015-09-21 NOTE — ED Notes (Signed)
Bladder scan 70 ml

## 2015-09-22 ENCOUNTER — Encounter: Payer: Federal, State, Local not specified - PPO | Admitting: Surgery

## 2015-09-22 DIAGNOSIS — D400 Neoplasm of uncertain behavior of prostate: Secondary | ICD-10-CM | POA: Diagnosis not present

## 2015-09-22 DIAGNOSIS — C61 Malignant neoplasm of prostate: Secondary | ICD-10-CM | POA: Diagnosis not present

## 2015-09-22 DIAGNOSIS — Z8546 Personal history of malignant neoplasm of prostate: Secondary | ICD-10-CM | POA: Diagnosis not present

## 2015-09-22 DIAGNOSIS — R338 Other retention of urine: Secondary | ICD-10-CM | POA: Diagnosis not present

## 2015-09-22 DIAGNOSIS — N3041 Irradiation cystitis with hematuria: Secondary | ICD-10-CM | POA: Diagnosis not present

## 2015-09-22 DIAGNOSIS — L598 Other specified disorders of the skin and subcutaneous tissue related to radiation: Secondary | ICD-10-CM | POA: Diagnosis not present

## 2015-09-22 NOTE — Progress Notes (Signed)
[name], [name] ([id number]) Visit Report for 09/22/2015 Arrival Information Details Patient Name: [name], [name] Date of Service: 09/22/2015 10:00 AM Medical Record Number: [id number] Patient Account Number: [id number] Date of Birth/Sex: Feb 19, 1955 (60 y.o. Male) Treating RN: Primary Care Physician: Annye Asa Other Clinician: Jacqulyn Bath Referring Physician: Annye Asa Treating Physician/Extender: Frann Rider in Treatment: 3 Visit Information History Since Last Visit Added or deleted any medications: No Patient Arrived: Ambulatory Any new allergies or adverse reactions: No Arrival Time: 09:20 Had a fall or experienced change in No Accompanied By: self activities of daily living that may affect Transfer Assistance: None risk of falls: Patient Identification Verified: Yes Signs or symptoms of abuse/neglect since last No Secondary Verification Process Yes visito Completed: Hospitalized since last visit: No Patient Requires Transmission-Based No Pain Present Now: No Precautions: Patient Has Alerts: No Electronic Signature(s) Signed: 09/22/2015 3:22:41 PM By: Lorine Bears RCP, RRT, CHT Entered By: Lorine Bears on 09/22/2015 11:23:46 [name] ([id number]) -------------------------------------------------------------------------------- Encounter Discharge Information Details Patient Name: [name] Date of Service: 09/22/2015 10:00 AM Medical Record Number: [id number] Patient Account Number: [id number] Date of Birth/Sex: 07/25/1955 (60 y.o. Male) Treating RN: Primary Care Physician: Annye Asa Other Clinician: Jacqulyn Bath Referring Physician: Annye Asa Treating Physician/Extender: Frann Rider in Treatment: 3 Encounter Discharge Information Items Discharge Pain Level: 0 Discharge Condition: Stable Ambulatory Status: Ambulatory Discharge Destination:  Home Private Transportation: Auto Accompanied By: self Schedule Follow-up Appointment: No Medication Reconciliation completed and No provided to Patient/Care Jean Skow: Clinical Summary of Care: Notes Patient has an HBO treatment scheduled on 09/23/15 at 10:00 am. Electronic Signature(s) Signed: 09/22/2015 3:22:41 PM By: Lorine Bears RCP, RRT, CHT Entered By: Lorine Bears on 09/22/2015 11:38:27 [name] ([id number]) -------------------------------------------------------------------------------- Vitals Details Patient Name: [name] Date of Service: 09/22/2015 10:00 AM Medical Record Number: [id number] Patient Account Number: [id number] Date of Birth/Sex: January 07, 1955 (60 y.o. Male) Treating RN: Primary Care Physician: Annye Asa Other Clinician: Jacqulyn Bath Referring Physician: Annye Asa Treating Physician/Extender: Frann Rider in Treatment: 3 Vital Signs Time Taken: 09:30 Temperature (F): 98.4 Height (in): 65 Pulse (bpm): 78 Weight (lbs): 162 Respiratory Rate (breaths/min): 16 Body Mass Index (BMI): 27 Blood Pressure (mmHg): 152/76 Reference Range: 80 - 120 mg / dl Electronic Signature(s) Signed: 09/22/2015 3:22:41 PM By: Lorine Bears RCP, RRT, CHT Entered By: [name], Amado Nash on 09/22/2015 11:24:16

## 2015-09-22 NOTE — Progress Notes (Signed)
COIT, FILKINS ([id number]) Visit Report for 09/22/2015 HBO Details Patient Name: [name], [name] Date of Service: 09/22/2015 10:00 AM Medical Record Number: [id number] Patient Account Number: [id number] Date of Birth/Sex: Mar 08, 1955 (60 y.o. 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 HBO Treatment Details Treatment Number: 15 Patient Type: Outpatient Chamber Type: Monoplace Chamber Serial #: [id number] Treatment Protocol: 2.0 ATA with 90 minutes oxygen, and no air breaks Treatment Details Compression Rate Down: 1.5 psi / minute De-Compression Rate Up: 1.5 psi / minute Air breaks and breathing Compress Tx Pressure periods Decompress Decompress Begins Reached (leave unused spaces Begins Ends blank) Chamber Pressure (ATA) 1 2 - - - - - - 2 1 Clock Time (24 hr) 09:32 09:44 - - - - - - 11:14 11:25 Treatment Length: 113 (minutes) Treatment Segments: 4 Capillary Blood Glucose Pre Capillary Blood Glucose (mg/dl): Post Capillary Blood Glucose (mg/dl): Vital Signs Capillary Blood Glucose Reference Range: 80 - 120 mg / dl HBO Diabetic Blood Glucose Intervention Range: <131 mg/dl or >249 mg/dl Time Vitals Blood Respiratory Capillary Blood Glucose Pulse Action Type: Pulse: Temperature: Taken: Pressure: Rate: Glucose (mg/dl): Meter #: Oximetry (%) Taken: Pre 09:30 152/76 78 16 98.4 Post 11:30 142/84 16 66 98.4 Treatment Response Treatment Completion Status: Treatment Completed without Adverse Event Manninen, Shashwat ([id number]) HBO Attestation I certify that I supervised this HBO treatment in accordance with Medicare  guidelines. A trained Yes emergency response team is readily available per hospital policies and procedures. Continue HBOT as ordered. Yes Electronic Signature(s) Signed: 09/22/2015 1:27:46 PM By: Christin Fudge MD, FACS Entered By: Christin Fudge on 09/22/2015 13:27:46 Messerschmidt, Samiel ([id number]) -------------------------------------------------------------------------------- HBO Safety Checklist Details Patient Name: [name] Date of Service: 09/22/2015 10:00 AM Medical Record Number: [id number] Patient Account Number: [id number] Date of Birth/Sex: October 09, 1955 (60 y.o. Male) Treating RN: Primary Care Physician: Annye Asa Other Clinician: Jacqulyn Bath Referring Physician: Annye Asa Treating Physician/Extender: Frann Rider in Treatment: 3 HBO Safety Checklist Items Safety Checklist Consent Form Signed Patient voided / foley secured and emptied When did you last eato 09/21/15 pm Last dose of injectable or oral agent n/a NA Ostomy pouch emptied and vented if applicable NA All implantable devices assessed, documented and approved NA Intravenous access site secured and place Valuables secured Linens and cotton and cotton/polyester blend (less than 51% polyester) Personal oil-based products / skin lotions / body lotions removed NA Wigs or hairpieces removed NA Smoking or tobacco materials removed Books / newspapers / magazines / loose paper removed Cologne, aftershave, perfume and deodorant removed Jewelry removed (may wrap wedding band) NA Make-up removed NA Hair care products removed Battery operated devices (external) removed Heating patches and chemical warmers removed NA Titanium eyewear removed NA Nail polish cured greater than 10 hours NA Casting material cured greater than 10 hours NA Hearing aids removed Loose dentures or partials removed NA Prosthetics have been removed Patient demonstrates correct use of air break device (if  applicable) Patient concerns have been addressed Patient grounding bracelet on and cord attached to chamber Specifics for Inpatients (complete in addition to above) Medication sheet sent with patient Intravenous medications needed or due during therapy sent with patient Keasling, Byron ([id number]) Drainage tubes (e.g. nasogastric tube or chest tube secured and vented) Endotracheal or  Tracheotomy tube secured Cuff deflated of air and inflated with saline Airway suctioned Electronic Signature(s) Signed: 09/22/2015 3:22:41 PM By: Lorine Bears RCP, RRT, CHT Entered By: Becky Sax, Amado Nash on 09/22/2015 11:25:10

## 2015-09-23 ENCOUNTER — Encounter: Payer: Self-pay | Admitting: *Deleted

## 2015-09-23 ENCOUNTER — Ambulatory Visit: Payer: Federal, State, Local not specified - PPO | Admitting: Anesthesiology

## 2015-09-23 ENCOUNTER — Ambulatory Visit
Admission: AD | Admit: 2015-09-23 | Discharge: 2015-09-23 | Disposition: A | Discharge status: Home / Self Care | Payer: Federal, State, Local not specified - PPO | Source: Ambulatory Visit | Attending: Urology | Admitting: Urology

## 2015-09-23 ENCOUNTER — Encounter: Payer: Federal, State, Local not specified - PPO | Admitting: Internal Medicine

## 2015-09-23 ENCOUNTER — Encounter
Admission: AD | Disposition: A | Discharge status: Home / Self Care | Payer: Self-pay | Source: Ambulatory Visit | Attending: Urology

## 2015-09-23 DIAGNOSIS — Z923 Personal history of irradiation: Secondary | ICD-10-CM | POA: Diagnosis not present

## 2015-09-23 DIAGNOSIS — R338 Other retention of urine: Secondary | ICD-10-CM | POA: Diagnosis not present

## 2015-09-23 DIAGNOSIS — Y842 Radiological procedure and radiotherapy as the cause of abnormal reaction of the patient, or of later complication, without mention of misadventure at the time of the procedure: Secondary | ICD-10-CM | POA: Diagnosis not present

## 2015-09-23 DIAGNOSIS — Z79899 Other long term (current) drug therapy: Secondary | ICD-10-CM | POA: Insufficient documentation

## 2015-09-23 DIAGNOSIS — C61 Malignant neoplasm of prostate: Secondary | ICD-10-CM

## 2015-09-23 DIAGNOSIS — R310 Gross hematuria: Secondary | ICD-10-CM | POA: Diagnosis not present

## 2015-09-23 DIAGNOSIS — I10 Essential (primary) hypertension: Secondary | ICD-10-CM | POA: Diagnosis not present

## 2015-09-23 DIAGNOSIS — R339 Retention of urine, unspecified: Secondary | ICD-10-CM | POA: Diagnosis not present

## 2015-09-23 DIAGNOSIS — N3031 Trigonitis with hematuria: Secondary | ICD-10-CM | POA: Insufficient documentation

## 2015-09-23 DIAGNOSIS — R319 Hematuria, unspecified: Secondary | ICD-10-CM

## 2015-09-23 DIAGNOSIS — Z87891 Personal history of nicotine dependence: Secondary | ICD-10-CM | POA: Insufficient documentation

## 2015-09-23 DIAGNOSIS — N3041 Irradiation cystitis with hematuria: Secondary | ICD-10-CM | POA: Diagnosis not present

## 2015-09-23 DIAGNOSIS — N3040 Irradiation cystitis without hematuria: Secondary | ICD-10-CM

## 2015-09-23 DIAGNOSIS — N3091 Cystitis, unspecified with hematuria: Secondary | ICD-10-CM | POA: Diagnosis not present

## 2015-09-23 DIAGNOSIS — N411 Chronic prostatitis: Secondary | ICD-10-CM

## 2015-09-23 HISTORY — PX: CYSTOSCOPY WITH FULGERATION: SHX6638

## 2015-09-23 HISTORY — PX: TRANSURETHRAL RESECTION OF PROSTATE: SHX73

## 2015-09-23 LAB — CBC
HCT: 30.5 % — ABNORMAL LOW (ref 40.0–52.0)
Hemoglobin: 10.9 g/dL — ABNORMAL LOW (ref 13.0–18.0)
MCH: 33.5 pg (ref 26.0–34.0)
MCHC: 35.7 g/dL (ref 32.0–36.0)
MCV: 93.8 fL (ref 80.0–100.0)
PLATELETS: 356 10*3/uL (ref 150–440)
RBC: 3.26 MIL/uL — ABNORMAL LOW (ref 4.40–5.90)
RDW: 13 % (ref 11.5–14.5)
WBC: 8.2 10*3/uL (ref 3.8–10.6)

## 2015-09-23 SURGERY — CYSTOSCOPY, WITH BLADDER FULGURATION
Anesthesia: General | Site: Prostate | Wound class: Clean Contaminated

## 2015-09-23 MED ORDER — FENTANYL CITRATE (PF) 100 MCG/2ML IJ SOLN
25.0000 ug | INTRAMUSCULAR | Status: DC | PRN
  Administered 2015-09-23: 50 ug via INTRAVENOUS
  Administered 2015-09-23: 25 ug via INTRAVENOUS
  Administered 2015-09-23: 50 ug via INTRAVENOUS
  Administered 2015-09-23: 25 ug via INTRAVENOUS

## 2015-09-23 MED ORDER — PROPOFOL 10 MG/ML IV BOLUS
INTRAVENOUS | Status: DC | PRN
Start: 2015-09-23 — End: 2015-09-23
  Administered 2015-09-23: 200 mg via INTRAVENOUS

## 2015-09-23 MED ORDER — PHENYLEPHRINE HCL 10 MG/ML IJ SOLN
INTRAMUSCULAR | Status: DC | PRN
  Administered 2015-09-23 (×3): 100 ug via INTRAVENOUS
  Administered 2015-09-23: 200 ug via INTRAVENOUS

## 2015-09-23 MED ORDER — LACTATED RINGERS IV SOLN
INTRAVENOUS | Status: DC
  Administered 2015-09-23 (×2): via INTRAVENOUS

## 2015-09-23 MED ORDER — UROGESIC-BLUE 81.6 MG PO TABS: 1.0000 | ORAL_TABLET | Freq: Four times a day (QID) | ORAL | 3 refills | Status: DC | PRN

## 2015-09-23 MED ORDER — FENTANYL CITRATE (PF) 100 MCG/2ML IJ SOLN
INTRAMUSCULAR | Status: AC
  Filled 2015-09-23: qty 2

## 2015-09-23 MED ORDER — NUCYNTA 50 MG PO TABS: 50.0000 mg | ORAL_TABLET | Freq: Four times a day (QID) | ORAL | 0 refills | Status: DC | PRN

## 2015-09-23 MED ORDER — BELLADONNA ALKALOIDS-OPIUM 16.2-60 MG RE SUPP
RECTAL | Status: DC | PRN
  Administered 2015-09-23: 1 via RECTAL

## 2015-09-23 MED ORDER — LIDOCAINE HCL (CARDIAC) 20 MG/ML IV SOLN
INTRAVENOUS | Status: DC | PRN
  Administered 2015-09-23: 100 mg via INTRAVENOUS

## 2015-09-23 MED ORDER — FENTANYL CITRATE (PF) 100 MCG/2ML IJ SOLN
INTRAMUSCULAR | Status: DC | PRN
  Administered 2015-09-23 (×4): 50 ug via INTRAVENOUS

## 2015-09-23 MED ORDER — GLYCOPYRROLATE 0.2 MG/ML IJ SOLN
INTRAMUSCULAR | Status: DC | PRN
  Administered 2015-09-23: 0.2 mg via INTRAVENOUS

## 2015-09-23 MED ORDER — LIDOCAINE HCL 2 % EX GEL
CUTANEOUS | Status: DC | PRN
  Administered 2015-09-23: 1 via URETHRAL

## 2015-09-23 MED ORDER — ONDANSETRON HCL 4 MG/2ML IJ SOLN
INTRAMUSCULAR | Status: DC | PRN
  Administered 2015-09-23: 4 mg via INTRAVENOUS

## 2015-09-23 MED ORDER — FENTANYL CITRATE (PF) 100 MCG/2ML IJ SOLN
INTRAMUSCULAR | Status: AC
  Administered 2015-09-23: 50 ug via INTRAVENOUS
  Filled 2015-09-23: qty 2

## 2015-09-23 MED ORDER — SULFAMETHOXAZOLE-TRIMETHOPRIM 800-160 MG PO TABS: 1.0000 | ORAL_TABLET | Freq: Two times a day (BID) | ORAL | 0 refills | Status: DC

## 2015-09-23 MED ORDER — LEVOFLOXACIN IN D5W 500 MG/100ML IV SOLN
500.0000 mg | Freq: Once | INTRAVENOUS | Status: AC
  Administered 2015-09-23: 500 mg via INTRAVENOUS

## 2015-09-23 MED ORDER — ONDANSETRON 8 MG PO TBDP: 8.0000 mg | ORAL_TABLET | Freq: Four times a day (QID) | ORAL | 3 refills | Status: DC | PRN

## 2015-09-23 MED ORDER — PROMETHAZINE HCL 25 MG/ML IJ SOLN: 6.2500 mg | INTRAMUSCULAR | Status: DC | PRN

## 2015-09-23 MED ORDER — BELLADONNA ALKALOIDS-OPIUM 16.2-60 MG RE SUPP
RECTAL | Status: AC
  Filled 2015-09-23: qty 1

## 2015-09-23 MED ORDER — EPHEDRINE SULFATE 50 MG/ML IJ SOLN
INTRAMUSCULAR | Status: DC | PRN
  Administered 2015-09-23: 10 mg via INTRAVENOUS
  Administered 2015-09-23: 5 mg via INTRAVENOUS
  Administered 2015-09-23: 10 mg via INTRAVENOUS

## 2015-09-23 MED ORDER — MIDAZOLAM HCL 2 MG/2ML IJ SOLN
INTRAMUSCULAR | Status: DC | PRN
  Administered 2015-09-23: 2 mg via INTRAVENOUS

## 2015-09-23 MED ORDER — LIDOCAINE HCL 2 % EX GEL
CUTANEOUS | Status: AC
  Filled 2015-09-23: qty 10

## 2015-09-23 MED ORDER — LEVOFLOXACIN IN D5W 500 MG/100ML IV SOLN
INTRAVENOUS | Status: AC
  Filled 2015-09-23: qty 100

## 2015-09-23 SURGICAL SUPPLY — 32 items
BAG DRAIN CYSTO-URO LG1000N (MISCELLANEOUS) ×3 IMPLANT
BAG URO DRAIN 2000ML W/SPOUT (MISCELLANEOUS) ×3 IMPLANT
BAG URO DRAIN 4000ML (MISCELLANEOUS) ×3 IMPLANT
CATH FOL 3WAY LX COUV 24X75 (CATHETERS) ×3 IMPLANT
CATH FOLEY 2WAY  5CC 20FR SIL (CATHETERS) ×1
CATH FOLEY 2WAY 5CC 20FR SIL (CATHETERS) ×2 IMPLANT
CATH URETL OPEN END 6X70 (CATHETERS) ×3 IMPLANT
ELECT COAG MONO 22-24F ROLLER (MISCELLANEOUS) ×3
ELECT REM PT RETURN 9FT ADLT (ELECTROSURGICAL) ×3
ELECTRODE COAG MONO 22-24F RLR (MISCELLANEOUS) ×2 IMPLANT
ELECTRODE REM PT RTRN 9FT ADLT (ELECTROSURGICAL) ×2 IMPLANT
EVACUATOR ELLICK (MISCELLANEOUS) ×3 IMPLANT
GLOVE BIO SURGEON STRL SZ7 (GLOVE) ×6 IMPLANT
GLOVE BIO SURGEON STRL SZ7.5 (GLOVE) ×3 IMPLANT
GLYCINE 1.5% IRRIG UROMATIC (IV SOLUTION) ×24 IMPLANT
GOWN STRL REUS W/ TWL LRG LVL3 (GOWN DISPOSABLE) ×2 IMPLANT
GOWN STRL REUS W/ TWL XL LVL3 (GOWN DISPOSABLE) ×2 IMPLANT
GOWN STRL REUS W/TWL LRG LVL3 (GOWN DISPOSABLE) ×1
GOWN STRL REUS W/TWL XL LVL3 (GOWN DISPOSABLE) ×1
KIT RM TURNOVER CYSTO AR (KITS) ×3 IMPLANT
LOOP MONOPOLAR YLW (ELECTROSURGICAL) ×3 IMPLANT
NDL SAFETY 18GX1.5 (NEEDLE) ×3 IMPLANT
PACK CYSTO AR (MISCELLANEOUS) ×3 IMPLANT
PLUG CATH AND CAP STER (CATHETERS) ×6 IMPLANT
PREP PVP WINGED SPONGE (MISCELLANEOUS) ×3 IMPLANT
SET IRRIG Y TYPE TUR BLADDER L (SET/KITS/TRAYS/PACK) ×3 IMPLANT
SOL PREP PVP 2OZ (MISCELLANEOUS) ×3
SOLUTION PREP PVP 2OZ (MISCELLANEOUS) ×2 IMPLANT
SURGILUBE 2OZ TUBE FLIPTOP (MISCELLANEOUS) ×3 IMPLANT
SYRINGE IRR TOOMEY STRL 70CC (SYRINGE) ×3 IMPLANT
WATER STERILE IRR 1000ML POUR (IV SOLUTION) ×6 IMPLANT
WATER STERILE IRR 3000ML UROMA (IV SOLUTION) ×3 IMPLANT

## 2015-09-23 NOTE — Discharge Instructions (Addendum)
AMBULATORY SURGERY  DISCHARGE INSTRUCTIONS   1) The drugs that you were given will stay in your system until tomorrow so for the next 24 hours you should not:  A) Drive an automobile B) Make any legal decisions C) Drink any alcoholic beverage   2) You may resume regular meals tomorrow.  Today it is better to start with liquids and gradually work up to solid foods.  You may eat anything you prefer, but it is better to start with liquids, then soup and crackers, and gradually work up to solid foods.   3) Please notify your doctor immediately if you have any unusual bleeding, trouble breathing, redness and pain at the surgery site, drainage, fever, or pain not relieved by medication.  4) Your post-operative visit with Dr.                                     is: Date:                        Time:    Please call to schedule your post-operative visit.  5) Additional Instructions:   Transurethral Resection of the Prostate Transurethral resection of the prostate (TURP) is the removal of part of your prostate to treat noncancerous (benign) prostatic hyperplasia (BPH). BPH typically occurs in men older than 40 years. It is the abnormal growth of cells in your prostate. Specifically, it is an abnormal increase in the number of cells that make up your prostate tissue. This causes an increase in the size of your prostate. Often, in the case of BPH, the prostate becomes so large that it compresses the tube that drains urine out of your body from your bladder (urethra). Eventually, this compression can obstruct the flow of urine from your bladder. This obstruction can cause recurrent bladder infection and difficulties with bladder control and bladder emptying. The goal of TURP is to remove enough prostate tissue to allow for an unobstructed flow of urine, which often resolves the associated conditions. LET YOUR CAREGIVER KNOW ABOUT:  Any allergies you have.  Any medicines you are taking, including  herbs, eye drops, over-the-counter medicines, and creams.  Any problems you have had with the use of anesthetics.  Any blood disorders you have, including bleeding problems and clotting problems.  Previous surgeries you have had.  Any prostate infections you have had. RISKS AND COMPLICATIONS Generally, TURP is a safe procedure. However, as with any surgical procedure, complications can occur. Possible complications associated with TURP include:  Difficulty getting an erection.  Scarring, which may cause problems with the flow of your urine.  Injury to your urethra.  Incontinence from injury to the muscle that surrounds your prostate, which controls urine flow.  Infection.  Bleeding.  Injury to your bladder (rare). BEFORE THE PROCEDURE  Your caregiver will tell you when you need to stop eating and drinking. If you take any medicines, your caregiver will tell you which ones you may keep taking and which ones you will have to stop taking and when.  Just before the procedure you will also receive medicine to make you fall asleep (general anesthetic). This will be given through a tube that is inserted into one of your veins (intravenous [IV] tube). PROCEDURE Your surgeon inserts an instrument that is similar to a telescope with an electric cutting edge (resectoscope) through your urethra to the area of the prostate  gland. The cutting edge is used to remove enlarged pieces of your prostate, one piece at a time. At the end of your procedure, a flexible tube (catheter) will be inserted into your urethra to drain your bladder. Special plastic bags filled with solution will be connected to the end of the catheter. The solution will be used to irrigate blood from your bladder while you heal.  AFTER THE PROCEDURE You will be taken to the recovery area. Once you are awake, stable, and taking fluids well, you will be taken to your hospital room. Typically, you will stay in the hospital 1-2 days  after this procedure. The catheter usually is removed before discharge from the hospital.   This information is not intended to replace advice given to you by your health care provider. Make sure you discuss any questions you have with your health care provider.   Document Released: 12/21/2004 Document Revised: 01/11/2014 Document Reviewed: 05/24/2011 Elsevier Interactive Patient Education 2016 [place]. Acute Urinary Retention, Male Acute urinary retention is the temporary inability to urinate. This is a common problem in older men. As men age their prostates become larger and block the flow of urine from the bladder. This is usually a problem that has come on gradually.  HOME CARE INSTRUCTIONS If you are sent home with a Foley catheter and a drainage system, you will need to discuss the best course of action with your health care provider. While the catheter is in, maintain a good intake of fluids. Keep the drainage bag emptied and lower than your catheter. This is so that contaminated urine will not flow back into your bladder, which could lead to a urinary tract infection. There are two main types of drainage bags. One is a large bag that usually is used at night. It has a good capacity that will allow you to sleep through the night without having to empty it. The second type is called a leg bag. It has a smaller capacity, so it needs to be emptied more frequently. However, the main advantage is that it can be attached by a leg strap and can go underneath your clothing, allowing you the freedom to move about or leave your home. Only take over-the-counter or prescription medicines for pain, discomfort, or fever as directed by your health care provider.  SEEK MEDICAL CARE IF:  You develop a low-grade fever.  You experience spasms or leakage of urine with the spasms. SEEK IMMEDIATE MEDICAL CARE IF:   You develop chills or fever.  Your catheter stops draining urine.  Your catheter falls  out.  You start to develop increased bleeding that does not respond to rest and increased fluid intake. MAKE SURE YOU:  Understand these instructions.  Will watch your condition.  Will get help right away if you are not doing well or get worse.   This information is not intended to replace advice given to you by your health care provider. Make sure you discuss any questions you have with your health care provider.   Document Released: 03/29/2000 Document Revised: 05/07/2014 Document Reviewed: 06/01/2012 Elsevier Interactive Patient Education 2016 [employer] Hematuria, Adult Hematuria is blood in your urine. It can be caused by a bladder infection, kidney infection, prostate infection, kidney stone, or cancer of your urinary tract. Infections can usually be treated with medicine, and a kidney stone usually will pass through your urine. If neither of these is the cause of your hematuria, further workup to find out the reason may be  needed. It is very important that you tell your health care provider about any blood you see in your urine, even if the blood stops without treatment or happens without causing pain. Blood in your urine that happens and then stops and then happens again can be a symptom of a very serious condition. Also, pain is not a symptom in the initial stages of many urinary cancers. HOME CARE INSTRUCTIONS   Drink lots of fluid, 3-4 quarts a day. If you have been diagnosed with an infection, cranberry juice is especially recommended, in addition to large amounts of water.  Avoid caffeine, tea, and carbonated beverages because they tend to irritate the bladder.  Avoid alcohol because it may irritate the prostate.  Take all medicines as directed by your health care provider.  If you were prescribed an antibiotic medicine, finish it all even if you start to feel better.  If you have been diagnosed with a kidney stone, follow your health care provider's instructions  regarding straining your urine to catch the stone.  Empty your bladder often. Avoid holding urine for long periods of time.  After a bowel movement, women should cleanse front to back. Use each tissue only once.  Empty your bladder before and after sexual intercourse if you are a male. SEEK MEDICAL CARE IF:  You develop back pain.  You have a fever.  You have a feeling of sickness in your stomach (nausea) or vomiting.  Your symptoms are not better in 3 days. Return sooner if you are getting worse. SEEK IMMEDIATE MEDICAL CARE IF:   You develop severe vomiting and are unable to keep the medicine down.  You develop severe back or abdominal pain despite taking your medicines.  You begin passing a large amount of blood or clots in your urine.  You feel extremely weak or faint, or you pass out. MAKE SURE YOU:   Understand these instructions.  Will watch your condition.  Will get help right away if you are not doing well or get worse.   This information is not intended to replace advice given to you by your health care provider. Make sure you discuss any questions you have with your health care provider.   Document Released: 12/21/2004 Document Revised: 01/11/2014 Document Reviewed: 08/21/2012 Elsevier Interactive Patient Education [employer].

## 2015-09-23 NOTE — Anesthesia Procedure Notes (Signed)
Procedure Name: LMA Insertion Date/Time: 09/23/2015 2:55 PM Performed by: Doreen Salvage Pre-anesthesia Checklist: Patient identified, Patient being monitored, Timeout performed, Emergency Drugs available and Suction available Patient Re-evaluated:Patient Re-evaluated prior to inductionOxygen Delivery Method: Circle system utilized Preoxygenation: Pre-oxygenation with 100% oxygen Intubation Type: IV induction Ventilation: Mask ventilation without difficulty LMA: LMA inserted LMA Size: 4.5 Tube type: Oral Number of attempts: 1 Placement Confirmation: positive ETCO2 and breath sounds checked- equal and bilateral Tube secured with: Tape Dental Injury: Teeth and Oropharynx as per pre-operative assessment

## 2015-09-23 NOTE — H&P (Signed)
NAMEJAMARII, RAHL NO.:  [id number]  MEDICAL RECORD NO.:  [id number]  LOCATION:                                 FACILITY:  PHYSICIAN:  Maryan Puls          DATE OF BIRTH:  December 17, 1955  DATE OF ADMISSION:  09/23/2015 DATE OF DISCHARGE:                            HISTORY AND PHYSICAL   DATE OF SURGERY:  September 19.  CHIEF COMPLAINT:  Clot retention.  HISTORY OF PRESENT ILLNESS:  Mr. [name] is a 60 year old Hispanic male, who has had several episodes of clot urinary retention in the last 2 weeks.  He had 2 episodes of retention in September 18, requiring placement of a clot irrigation catheter with irrigation.  He comes in now for cystoscopy with clot evacuation and fulguration.  The patient has hemorrhagic radiation cystitis and has received 3 weeks of hyperbaric oxygen treatments of a total 8 week course.  He has a history of Gleason grade 3 + 4 adenocarcinoma prostate treated with combination external beam radiation therapy, brachytherapy, and androgen blockade in 2013.  PAST MEDICAL HISTORY:  No drug allergies.  CURRENT MEDICATIONS:  Lisinopril, multivitamins, and tamsulosin.  PAST SURGICAL HISTORY:  Bilateral vasectomy in 1991.  SOCIAL HISTORY:  The patient quit smoking in 2011 with a 30-pack-year history.  He consumes [id number] alcoholic beverage per week.  FAMILY HISTORY:  Was positive for parents with hypertension and hypercholesterolemia.  PAST AND CURRENT MEDICAL CONDITIONS:  Hypertension.  REVIEW OF SYSTEMS:  The patient denies chest pain, shortness of breath, diabetes, or stroke.  PHYSICAL EXAMINATION:  GENERAL:  Well-nourished male in no acute distress. HEENT:  Sclerae were clear.  Pupils are equally round, react to light and accommodation.  Extraocular movements were intact. NECK:  Supple.  No palpable cervical adenopathy. LUNGS:  Clear to auscultation. CARDIOVASCULAR:  Regular rhythm and rate without audible murmurs. ABDOMEN:   Soft, nontender abdomen. GU:  Uncircumcised, Foley catheter in position. NEUROMUSCULAR:  Alert and oriented x3.  IMPRESSION: 1. Recurrent clot retention. 2. Hemorrhagic radiation cystitis. 3. Prostate cancer, status post radiation therapy.  PLAN:  Cystoscopy with fulguration and clot evacuation.    ______________________________ Maryan Puls   ______________________________ Maryan Puls    MW/MEDQ  D:  09/22/2015  T:  09/22/2015  Job:  [id number]

## 2015-09-23 NOTE — Op Note (Signed)
Preoperative diagnosis: 1. Gross hematuria with clot retention                                            2. Urinary retention                                            3. Radiation cystitis                                            4. Hemorrhagic radiation prostatitis                                            5. Bladder outlet obstruction  Postoperative diagnosis: Same  Procedure: 1. Transurethral resection of the prostate                      2. Clot evacuation                      3. Bladder fulguration   Surgeon: Otelia Limes. Yves Dill MD  Anesthesia: General  Indications:See the history and physical. After informed consent the above procedure(s) were requested     Technique and findings: After adequate general anesthesia had been obtained the patient was placed into dorsal lithotomy position and the perineum was prepped and draped in the usual fashion. The 24 French resectoscope sheath was advanced into the bladder with the obturator in place. Resectoscope was coupled the camera and then placed into the sheath. Bladder was thoroughly inspected. There were several large clots present within the bladder and those were evacuated with a Toomey syringe. Patient had several small areas of hemorrhagic cystitis along the posterior bladder wall and those were fulgurated. Both ureteral orifices were identified and had clear reflux. Significant bleeding was encountered in the prosthetic urethral area. Patient was also noted to have bladder neck obstruction with median lobe present. The bladder neck tissue was resected with the loop and chips submitted to pathology. Resection was continued back to the verumontanum. All bleeders were controlled with electrocautery. Remaining chips were evacuated from the bladder. The resection was then removed and 10 cc of viscous Xylocaine instilled within the bladder. A 24 French three-way Foley catheter was advanced into the bladder with catheter guide. The catheter was  irrigated until clear. A B&O suppository was placed. Procedure was then terminated and patient transferred to the recovery room in stable condition.

## 2015-09-23 NOTE — Transfer of Care (Signed)
Immediate Anesthesia Transfer of Care Note  Patient: [name]  Procedure(s) Performed: Procedure(s): CYSTOSCOPY WITH FULGERATION (N/A) TRANSURETHRAL RESECTION OF THE PROSTATE (TURP) (N/A)  Patient Location: PACU  Anesthesia Type:General  Level of Consciousness: sedated  Airway & Oxygen Therapy: Patient Spontanous Breathing and Patient connected to face mask oxygen  Post-op Assessment: Report given to RN and Post -op Vital signs reviewed and stable  Post vital signs: Reviewed and stable  Last Vitals:  Vitals:   09/23/15 1307 09/23/15 1637  BP: (!) 145/85 139/68  Pulse: 76 81  Resp: 16 10  Temp: 36.1 C [id number] C    Complications: No apparent anesthesia complications

## 2015-09-23 NOTE — Anesthesia Preprocedure Evaluation (Signed)
Anesthesia Evaluation  Patient identified by MRN, date of birth, ID band Patient awake    Reviewed: Allergy & Precautions, H&P , NPO status , Patient's Chart, lab work & pertinent test results  Airway Mallampati: II  TM Distance: >3 FB Neck ROM: full    Dental no notable dental hx. (+) Poor Dentition, Missing, Edentulous Upper, Edentulous Lower   Pulmonary neg shortness of breath, former smoker,    Pulmonary exam normal breath sounds clear to auscultation       Cardiovascular Exercise Tolerance: Good hypertension, (-) angina(-) Past MI and (-) DOE Normal cardiovascular exam Rhythm:regular Rate:Normal     Neuro/Psych negative neurological ROS  negative psych ROS   GI/Hepatic negative GI ROS, Neg liver ROS, neg GERD  ,  Endo/Other  negative endocrine ROS  Renal/GU      Musculoskeletal   Abdominal   Peds  Hematology negative hematology ROS (+)   Anesthesia Other Findings Fully obstructed bladder   Past Medical History: No date: Cancer ([place]) No date: Hyperlipidemia No date: Hypertension  Past Surgical History: No date: APPENDECTOMY 08/19/2015: CYSTOSCOPY WITH FULGERATION N/A     Comment: Procedure: CYSTOSCOPY WITH FULGERATION / CLOT               EVACUATION;  Surgeon: Royston Cowper, MD;                Location: [facility] ORS;  Service: Urology;                Laterality: N/A;  BMI    Body Mass Index:  26.63 kg/m      Reproductive/Obstetrics negative OB ROS                             Anesthesia Physical  Anesthesia Plan  ASA: III and emergent  Anesthesia Plan: General   Post-op Pain Management:    Induction:   Airway Management Planned:   Additional Equipment:   Intra-op Plan:   Post-operative Plan:   Informed Consent: I have reviewed the patients History and Physical, chart, labs and discussed the procedure including the risks, benefits and alternatives for the  proposed anesthesia with the patient or authorized representative who has indicated his/her understanding and acceptance.     Plan Discussed with: Anesthesiologist, CRNA and Surgeon  Anesthesia Plan Comments:         Anesthesia Quick Evaluation

## 2015-09-23 NOTE — H&P (Signed)
Date of Initial H&P: 09/22/15  History reviewed, patient examined, no change in status, stable for surgery.

## 2015-09-24 ENCOUNTER — Encounter: Payer: Federal, State, Local not specified - PPO | Admitting: Internal Medicine

## 2015-09-24 ENCOUNTER — Encounter: Payer: Self-pay | Admitting: Urology

## 2015-09-24 DIAGNOSIS — N3041 Irradiation cystitis with hematuria: Secondary | ICD-10-CM | POA: Diagnosis not present

## 2015-09-24 DIAGNOSIS — L598 Other specified disorders of the skin and subcutaneous tissue related to radiation: Secondary | ICD-10-CM | POA: Diagnosis not present

## 2015-09-24 DIAGNOSIS — Z8546 Personal history of malignant neoplasm of prostate: Secondary | ICD-10-CM | POA: Diagnosis not present

## 2015-09-24 DIAGNOSIS — R338 Other retention of urine: Secondary | ICD-10-CM | POA: Diagnosis not present

## 2015-09-24 LAB — TYPE AND SCREEN
ABO/RH(D): O POS
Antibody Screen: NEGATIVE

## 2015-09-24 NOTE — Progress Notes (Signed)
LOYDE, RZEPECKI ([id number]) Visit Report for 09/24/2015 HBO Details Patient Name: NIKOLAOS, DEFAUW Date of Service: 09/24/2015 10:00 AM Medical Record Patient Account Number: [id number] Number: Treating RN: 1955-02-13 (60 y.o. 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 HBO Treatment Details Treatment Number: 16 Patient Type: Outpatient Chamber Type: Monoplace Chamber Serial #: [id number] Treatment Protocol: 2.0 ATA with 90 minutes oxygen, and no air breaks Treatment Details Compression Rate Down: 1.5 psi / minute De-Compression Rate Up: 1.5 psi / minute Air breaks and breathing Compress Tx Pressure periods Decompress Decompress Begins Reached (leave unused spaces Begins Ends blank) Chamber Pressure (ATA) 1 2 - - - - - - 2 1 Clock Time (24 hr) 10:20 10:32 - - - - - - 12:02 12:14 Treatment Length: 114 (minutes) Treatment Segments: 4 Capillary Blood Glucose Pre Capillary Blood Glucose (mg/dl): Post Capillary Blood Glucose (mg/dl): Vital Signs Capillary Blood Glucose Reference Range: 80 - 120 mg / dl HBO Diabetic Blood Glucose Intervention Range: <131 mg/dl or >249 mg/dl Time Vitals Blood Respiratory Capillary Blood Glucose Pulse Action Type: Pulse: Temperature: Taken: Pressure: Rate: Glucose (mg/dl): Meter #: Oximetry (%) Taken: Pre 10:10 138/82 72 16 97.7 Post 12:15 145/78 84 16 98.2 Cherian, Adonte ([id number]) Treatment Response Treatment Completion Status: Treatment Completed without Adverse Event Physician Notes No concerns with treatment given HBO Attestation I certify that I  supervised this HBO treatment in accordance with Medicare guidelines. A trained Yes emergency response team is readily available per hospital policies and procedures. Continue HBOT as ordered. Yes Electronic Signature(s) Signed: 09/24/2015 4:24:47 PM By: Linton Ham MD Previous Signature: 09/24/2015 1:38:04 PM Version By: Lorine Bears RCP, RRT, CHT Entered By: Linton Ham on 09/24/2015 14:34:12 [place], Camdan ([id number]) -------------------------------------------------------------------------------- HBO Safety Checklist Details Patient Name: [name] Date of Service: 09/24/2015 10:00 AM Medical Record Patient Account Number: [id number] Number: Treating RN: 1955-10-23 (60 y.o. Other Clinician: Jacqulyn Bath Date of Birth/Sex: Male) Treating ROBSON, [place] Primary Care Physician: Annye Asa Physician/Extender: G Referring Physician: Letha Cape in Treatment: 4 HBO Safety Checklist Items Safety Checklist Consent Form Signed Patient voided / foley secured and emptied When did you last eato 09/23/15 Last dose of injectable or oral agent n/a NA Ostomy pouch emptied and vented if applicable NA All implantable devices assessed, documented and approved NA Intravenous access site secured and place Valuables secured Linens and cotton and cotton/polyester blend (less than 51% polyester) Personal oil-based products / skin lotions / body lotions removed NA Wigs or hairpieces removed NA Smoking or tobacco materials removed Books / newspapers / magazines / loose paper removed Cologne, aftershave, perfume and deodorant removed Jewelry removed (may wrap wedding band) NA Make-up removed NA Hair care products removed Battery operated devices (external) removed Heating patches and chemical warmers removed NA Titanium eyewear removed NA Nail polish cured greater than 10 hours NA Casting material cured greater than 10 hours NA  Hearing aids removed Loose dentures or partials removed NA Prosthetics have been removed Patient demonstrates correct use of air break device (if applicable) Patient concerns have been addressed Patient grounding bracelet on and cord attached to chamber Specifics for Inpatients (complete in addition to above) Medication sheet sent with patient KAVA, Jameek ([id number]) Intravenous medications needed  or due during therapy sent with patient Drainage tubes (e.g. nasogastric tube or chest tube secured and vented) Endotracheal or Tracheotomy tube secured Cuff deflated of air and inflated with saline Airway suctioned Electronic Signature(s) Signed: 09/24/2015 1:38:04 PM By: Lorine Bears RCP, RRT, CHT Entered By: Lorine Bears on 09/24/2015 11:21:50

## 2015-09-24 NOTE — Anesthesia Postprocedure Evaluation (Signed)
Anesthesia Post Note  Patient: [name]  Procedure(s) Performed: Procedure(s) (LRB): CYSTOSCOPY WITH FULGERATION (N/A) TRANSURETHRAL RESECTION OF THE PROSTATE (TURP) (N/A)  Patient location during evaluation: PACU Anesthesia Type: General Level of consciousness: awake and alert and oriented Pain management: pain level controlled Vital Signs Assessment: post-procedure vital signs reviewed and stable Respiratory status: spontaneous breathing Cardiovascular status: blood pressure returned to baseline Anesthetic complications: no    Last Vitals:  Vitals:   09/23/15 1754 09/23/15 1822  BP: (!) 165/67 (!) 156/83  Pulse: 72 76  Resp: 16 16  Temp: 36.7 C     Last Pain:  Vitals:   09/23/15 1754  TempSrc: Oral  PainSc:                  [name]

## 2015-09-24 NOTE — Progress Notes (Signed)
RAYSHARD, ABBATIELLO ([id number]) Visit Report for 09/24/2015 Arrival Information Details Patient Name: JAXON, THORESEN Date of Service: 09/24/2015 10:00 AM Medical Record Patient Account Number: [id number] Number: Treating RN: 01/26/1955 (60 y.o. Other Clinician: Jacqulyn Bath Date of Birth/Sex: Male) Treating Dellia Nims [place] Primary Care Physician: Annye Asa Physician/Extender: G Referring Physician: Letha Cape in Treatment: 4 Visit Information History Since Last Visit Added or deleted any medications: No Patient Arrived: Ambulatory Any new allergies or adverse reactions: No Arrival Time: 10:00 Had a fall or experienced change in No Accompanied By: self activities of daily living that may affect Transfer Assistance: None risk of falls: Patient Identification Verified: Yes Signs or symptoms of abuse/neglect since last No Secondary Verification Process Yes visito Completed: Hospitalized since last visit: No Patient Requires Transmission-Based No Pain Present Now: No Precautions: Patient Has Alerts: No Electronic Signature(s) Signed: 09/24/2015 1:38:04 PM By: Lorine Bears RCP, RRT, CHT Entered By: Lorine Bears on 09/24/2015 11:20:18 Cedro, Silvester ([id number]) -------------------------------------------------------------------------------- Encounter Discharge Information Details Patient Name: [name] Date of Service: 09/24/2015 10:00 AM Medical Record Patient Account Number: [id number] Number: Treating RN: 09/29/1955 (60 y.o. Other Clinician: Jacqulyn Bath Date of Birth/Sex: Male) Treating Dellia Nims [place] Primary Care Physician: Annye Asa Physician/Extender: G Referring Physician: Letha Cape in Treatment: 4 Encounter Discharge Information Items Discharge Pain Level: 0 Discharge Condition: Stable Ambulatory Status: Ambulatory Discharge Destination:  Home Private Transportation: Auto Accompanied By: self Schedule Follow-up Appointment: No Medication Reconciliation completed and No provided to Patient/Care Lisa Blakeman: Clinical Summary of Care: Notes Patient has an HBO treatment scheduled on 09/25/15 at 10:00 am. Electronic Signature(s) Signed: 09/24/2015 1:38:04 PM By: Lorine Bears RCP, RRT, CHT Entered By: Lorine Bears on 09/24/2015 12:59:32 Ayr, Camari ([id number]) -------------------------------------------------------------------------------- Vitals Details Patient Name: [name] Date of Service: 09/24/2015 10:00 AM Medical Record Patient Account Number: [id number] Number: Treating RN: 1955-09-13 (60 y.o. Other Clinician: Jacqulyn Bath Date of Birth/Sex: Male) Treating ROBSON, [place] Primary Care Physician: Annye Asa Physician/Extender: G Referring Physician: Letha Cape in Treatment: 4 Vital Signs Time Taken: 10:10 Temperature (F): 97.7 Height (in): 65 Pulse (bpm): 72 Weight (lbs): 162 Respiratory Rate (breaths/min): 16 Body Mass Index (BMI): 27 Blood Pressure (mmHg): 138/82 Reference Range: 80 - 120 mg / dl Electronic Signature(s) Signed: 09/24/2015 1:38:04 PM By: Lorine Bears RCP, RRT, CHT Entered By: Lorine Bears on 09/24/2015 11:20:53

## 2015-09-25 ENCOUNTER — Encounter: Payer: Federal, State, Local not specified - PPO | Admitting: Surgery

## 2015-09-25 DIAGNOSIS — L598 Other specified disorders of the skin and subcutaneous tissue related to radiation: Secondary | ICD-10-CM | POA: Diagnosis not present

## 2015-09-25 DIAGNOSIS — N3041 Irradiation cystitis with hematuria: Secondary | ICD-10-CM | POA: Diagnosis not present

## 2015-09-25 DIAGNOSIS — R338 Other retention of urine: Secondary | ICD-10-CM | POA: Diagnosis not present

## 2015-09-25 DIAGNOSIS — Z8546 Personal history of malignant neoplasm of prostate: Secondary | ICD-10-CM | POA: Diagnosis not present

## 2015-09-26 ENCOUNTER — Encounter: Payer: Federal, State, Local not specified - PPO | Admitting: Surgery

## 2015-09-26 DIAGNOSIS — L598 Other specified disorders of the skin and subcutaneous tissue related to radiation: Secondary | ICD-10-CM | POA: Diagnosis not present

## 2015-09-26 DIAGNOSIS — Z8546 Personal history of malignant neoplasm of prostate: Secondary | ICD-10-CM | POA: Diagnosis not present

## 2015-09-26 DIAGNOSIS — R338 Other retention of urine: Secondary | ICD-10-CM | POA: Diagnosis not present

## 2015-09-26 DIAGNOSIS — N3041 Irradiation cystitis with hematuria: Secondary | ICD-10-CM | POA: Diagnosis not present

## 2015-09-26 LAB — SURGICAL PATHOLOGY

## 2015-09-26 NOTE — Progress Notes (Signed)
[name], [name] ([id number]) Visit Report for 09/25/2015 HBO Details Patient Name: [name], [name] Date of Service: 09/25/2015 10:00 AM Medical Record Number: [id number] Patient Account Number: [id number] Date of Birth/Sex: 11-16-1955 (60 y.o. 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 HBO Treatment Details Treatment Number: 17 Patient Type: Outpatient Chamber Type: Monoplace Chamber Serial #: [id number] Treatment Protocol: 2.0 ATA with 90 minutes oxygen, and no air breaks Treatment Details Compression Rate Down: 1.5 psi / minute De-Compression Rate Up: 1.5 psi / minute Air breaks and breathing Compress Tx Pressure periods Decompress Decompress Begins Reached (leave unused spaces Begins Ends blank) Chamber Pressure (ATA) 1 2 - - - - - - 2 1 Clock Time (24 hr) 10:19 10:31 - - - - - - 12:00 12:12 Treatment Length: 113 (minutes) Treatment Segments: 4 Capillary Blood Glucose Pre Capillary Blood Glucose (mg/dl): Post Capillary Blood Glucose (mg/dl): Vital Signs Capillary Blood Glucose Reference Range: 80 - 120 mg / dl HBO Diabetic Blood Glucose Intervention Range: <131 mg/dl or >249 mg/dl Time Vitals Blood Respiratory Capillary Blood Glucose Pulse Action Type: Pulse: Temperature: Taken: Pressure: Rate: Glucose (mg/dl): Meter #: Oximetry (%) Taken: Pre 10:15 116/64 72 16 98.6 Post 12:15 132/60 72 16 98.3 Treatment Response Treatment Completion Status: Treatment Completed without Adverse Event [name], [name] ([id number]) HBO Attestation I certify that I supervised this HBO treatment in accordance with Medicare  guidelines. A trained Yes emergency response team is readily available per hospital policies and procedures. Continue HBOT as ordered. Yes Electronic Signature(s) Signed: 09/25/2015 12:55:58 PM By: Christin Fudge MD, FACS Previous Signature: 09/25/2015 11:43:27 AM Version By: Christin Fudge MD, FACS Entered By: Christin Fudge on 09/25/2015 12:55:58 [name], [name] ([id number]) -------------------------------------------------------------------------------- HBO Safety Checklist Details Patient Name: [name] Date of Service: 09/25/2015 10:00 AM Medical Record Number: [id number] Patient Account Number: [id number] Date of Birth/Sex: Aug 25, 1955 (60 y.o. Male) Treating RN: Primary Care Physician: Annye Asa Other Clinician: Jacqulyn Bath Referring Physician: Annye Asa Treating Physician/Extender: Frann Rider in Treatment: 4 HBO Safety Checklist Items Safety Checklist Consent Form Signed Patient voided / foley secured and emptied When did you last eato 09/24/15 pm Last dose of injectable or oral agent n/a NA Ostomy pouch emptied and vented if applicable NA All implantable devices assessed, documented and approved NA Intravenous access site secured and place Valuables secured Linens and cotton and cotton/polyester blend (less than 51% polyester) Personal oil-based products / skin lotions / body lotions removed NA Wigs or hairpieces removed NA Smoking or tobacco materials removed Books / newspapers / magazines / loose paper removed Cologne, aftershave, perfume and deodorant removed Jewelry removed (may wrap wedding band) NA Make-up removed NA Hair care products removed Battery operated devices (external) removed Heating patches and chemical warmers removed NA Titanium eyewear removed NA Nail polish cured greater than 10 hours NA Casting material cured greater than 10 hours NA Hearing aids removed Loose dentures or partials removed NA Prosthetics have  been removed Patient demonstrates correct use of air break device (if applicable) Patient concerns have been addressed Patient grounding bracelet on and cord attached to chamber Specifics for Inpatients (complete in addition to above) Medication sheet sent with patient Intravenous medications needed or due during therapy sent with patient [name], [name] ([id number]) Drainage tubes (  e.g. nasogastric tube or chest tube secured and vented) Endotracheal or Tracheotomy tube secured Cuff deflated of air and inflated with saline Airway suctioned Electronic Signature(s) Signed: 09/25/2015 4:15:22 PM By: Lorine Bears RCP, RRT, CHT Entered By: Lorine Bears on 09/25/2015 10:37:03

## 2015-09-26 NOTE — Progress Notes (Signed)
NAGEE, PREY ([id number]) Visit Report for 09/25/2015 Arrival Information Details Patient Name: [name], [name] Date of Service: 09/25/2015 10:00 AM Medical Record Number: [id number] Patient Account Number: [id number] Date of Birth/Sex: 08-13-1955 (60 y.o. Male) Treating RN: Primary Care Physician: Annye Asa Other Clinician: Jacqulyn Bath Referring Physician: Annye Asa Treating Physician/Extender: Frann Rider in Treatment: 4 Visit Information History Since Last Visit Added or deleted any medications: No Patient Arrived: Ambulatory Any new allergies or adverse reactions: No Arrival Time: 09:50 Had a fall or experienced change in No Accompanied By: self activities of daily living that may affect Transfer Assistance: None risk of falls: Patient Identification Verified: Yes Signs or symptoms of abuse/neglect since last No Secondary Verification Process Yes visito Completed: Hospitalized since last visit: No Patient Requires Transmission-Based No Pain Present Now: No Precautions: Patient Has Alerts: No Electronic Signature(s) Signed: 09/25/2015 4:15:22 PM By: Lorine Bears RCP, RRT, CHT Entered By: Lorine Bears on 09/25/2015 10:34:03 Chavous, Corie ([id number]) -------------------------------------------------------------------------------- Encounter Discharge Information Details Patient Name: [name] Date of Service: 09/25/2015 10:00 AM Medical Record Number: [id number] Patient Account Number: [id number] Date of Birth/Sex: 12-28-1955 (60 y.o. Male) Treating RN: Primary Care Physician: Annye Asa Other Clinician: Jacqulyn Bath Referring Physician: Annye Asa Treating Physician/Extender: Frann Rider in Treatment: 4 Encounter Discharge Information Items Discharge Pain Level: 0 Discharge Condition: Stable Ambulatory Status: Ambulatory Discharge Destination:  Home Private Transportation: Auto Accompanied By: self Schedule Follow-up Appointment: No Medication Reconciliation completed and No provided to Patient/Care Deyanira Fesler: Clinical Summary of Care: Notes Patient has an HBO treatment scheduled on 09/26/15 at 10:00 am. Electronic Signature(s) Signed: 09/25/2015 4:15:22 PM By: Lorine Bears RCP, RRT, CHT Entered By: Lorine Bears on 09/25/2015 12:28:47 [name] ([id number]) -------------------------------------------------------------------------------- Vitals Details Patient Name: [name] Date of Service: 09/25/2015 10:00 AM Medical Record Number: [id number] Patient Account Number: [id number] Date of Birth/Sex: 1955-03-17 (60 y.o. Male) Treating RN: Primary Care Physician: Annye Asa Other Clinician: Jacqulyn Bath Referring Physician: Annye Asa Treating Physician/Extender: Frann Rider in Treatment: 4 Vital Signs Time Taken: 10:15 Temperature (F): 98.6 Height (in): 65 Pulse (bpm): 72 Weight (lbs): 162 Respiratory Rate (breaths/min): 16 Body Mass Index (BMI): 27 Blood Pressure (mmHg): 116/64 Reference Range: 80 - 120 mg / dl Electronic Signature(s) Signed: 09/25/2015 4:15:22 PM By: Lorine Bears RCP, RRT, CHT Entered By: Lorine Bears on 09/25/2015 10:34:54

## 2015-09-27 NOTE — Progress Notes (Signed)
KAYVIN, MOODY ([id number]) Visit Report for 09/26/2015 Arrival Information Details Patient Name: [name], [name] Date of Service: 09/26/2015 10:00 AM Medical Record Number: [id number] Patient Account Number: [id number] Date of Birth/Sex: [birth year] (60 y.o. Male) Treating RN: Primary Care Physician: Annye Asa Other Clinician: Jacqulyn Bath Referring Physician: Annye Asa Treating Physician/Extender: Frann Rider in Treatment: 4 Visit Information History Since Last Visit Added or deleted any medications: No Patient Arrived: Ambulatory Any new allergies or adverse reactions: No Arrival Time: 09:40 Had a fall or experienced change in No Accompanied By: self activities of daily living that may affect Transfer Assistance: None risk of falls: Patient Identification Verified: Yes Signs or symptoms of abuse/neglect since last No Secondary Verification Process Yes visito Completed: Pain Present Now: No Patient Requires Transmission-Based No Precautions: Patient Has Alerts: No Electronic Signature(s) Signed: 09/26/2015 4:38:53 PM By: Lorine Bears RCP, RRT, CHT Entered By: Lorine Bears on 09/26/2015 09:43:32 [name], [name] ([id number]) -------------------------------------------------------------------------------- Encounter Discharge Information Details Patient Name: [name] Date of Service: 09/26/2015 10:00 AM Medical Record Number: [id number] Patient Account Number: [id number] Date of Birth/Sex: 05/11/1955 (60 y.o. Male) Treating RN: Primary Care Physician: Annye Asa Other Clinician: Jacqulyn Bath Referring Physician: Annye Asa Treating Physician/Extender: Frann Rider in Treatment: 4 Encounter Discharge Information Items Discharge Pain Level: 0 Discharge Condition: Stable Ambulatory Status: Ambulatory Discharge Destination: Home Private Transportation: Auto Accompanied By: self Schedule  Follow-up Appointment: No Medication Reconciliation completed and No provided to Patient/Care Maximiliano Cromartie: Clinical Summary of Care: Notes Patient has an HBO treatment scheduled on 09/29/15 at 10:00 am. Electronic Signature(s) Signed: 09/26/2015 4:38:53 PM By: Lorine Bears RCP, RRT, CHT Entered By: Lorine Bears on 09/26/2015 12:26:22 [name], [name] ([id number]) -------------------------------------------------------------------------------- Vitals Details Patient Name: [name] Date of Service: 09/26/2015 10:00 AM Medical Record Number: [id number] Patient Account Number: [id number] Date of Birth/Sex: [birth year] (60 y.o. Male) Treating RN: Primary Care Physician: Annye Asa Other Clinician: Jacqulyn Bath Referring Physician: Annye Asa Treating Physician/Extender: Frann Rider in Treatment: 4 Vital Signs Time Taken: 09:50 Temperature (F): 98.3 Height (in): 65 Pulse (bpm): 78 Weight (lbs): 162 Respiratory Rate (breaths/min): 16 Body Mass Index (BMI): 27 Blood Pressure (mmHg): 116/66 Reference Range: 80 - 120 mg / dl Electronic Signature(s) Signed: 09/26/2015 4:38:53 PM By: Lorine Bears RCP, RRT, CHT Entered By: Lorine Bears on 09/26/2015 10:30:34

## 2015-09-27 NOTE — Progress Notes (Signed)
TRASEAN, HOLTON ([id number]) Visit Report for 09/26/2015 HBO Details Patient Name: CHARAN, NUZZI Date of Service: 09/26/2015 10:00 AM Medical Record Number: [id number] Patient Account Number: [id number] Date of Birth/Sex: [birth year] (60 y.o. 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 HBO Treatment Details Treatment Number: 18 Patient Type: Outpatient Chamber Type: Monoplace Chamber Serial #: [id number] Treatment Protocol: 2.0 ATA with 90 minutes oxygen, and no air breaks Treatment Details Compression Rate Down: 1.5 psi / minute De-Compression Rate Up: 1.5 psi / minute Air breaks and breathing Compress Tx Pressure periods Decompress Decompress Begins Reached (leave unused spaces Begins Ends blank) Chamber Pressure (ATA) 1 2 - - - - - - 2 1 Clock Time (24 hr) 10:17 10:27 - - - - - - 11:57 12:07 Treatment Length: 110 (minutes) Treatment Segments: 4 Capillary Blood Glucose Pre Capillary Blood Glucose (mg/dl): Post Capillary Blood Glucose (mg/dl): Vital Signs Capillary Blood Glucose Reference Range: 80 - 120 mg / dl HBO Diabetic Blood Glucose Intervention Range: <131 mg/dl or >249 mg/dl Time Vitals Blood Respiratory Capillary Blood Glucose Pulse Action Type: Pulse: Temperature: Taken: Pressure: Rate: Glucose (mg/dl): Meter #: Oximetry (%) Taken: Pre 09:50 116/66 78 16 98.3 Post 12:10 138/78 66 16 98.3 Treatment Response Treatment Completion Status: Treatment Completed without Adverse Event Postema, Elmar ([id number]) HBO Attestation I certify that I supervised this HBO treatment in accordance with Medicare  guidelines. A trained Yes emergency response team is readily available per hospital policies and procedures. Continue HBOT as ordered. Yes Electronic Signature(s) Signed: 09/26/2015 1:20:00 PM By: Christin Fudge MD, FACS Entered By: Christin Fudge on 09/26/2015 13:20:00 Gashi, Demtrius ([id number]) -------------------------------------------------------------------------------- HBO Safety Checklist Details Patient Name: [name] Date of Service: 09/26/2015 10:00 AM Medical Record Number: [id number] Patient Account Number: [id number] Date of Birth/Sex: 1955/11/07 (60 y.o. Male) Treating RN: Primary Care Physician: Annye Asa Other Clinician: Jacqulyn Bath Referring Physician: Annye Asa Treating Physician/Extender: Frann Rider in Treatment: 4 HBO Safety Checklist Items Safety Checklist Consent Form Signed Patient voided / foley secured and emptied When did you last eato 09/25/15 pm Last dose of injectable or oral agent n/a NA Ostomy pouch emptied and vented if applicable NA All implantable devices assessed, documented and approved NA Intravenous access site secured and place NA Valuables secured Linens and cotton and cotton/polyester blend (less than 51% polyester) Personal oil-based products / skin lotions / body lotions removed NA Wigs or hairpieces removed NA Smoking or tobacco materials removed Books / newspapers / magazines / loose paper removed Cologne, aftershave, perfume and deodorant removed Jewelry removed (may wrap wedding band) NA Make-up removed NA Hair care products removed Battery operated devices (external) removed Heating patches and chemical warmers removed NA Titanium eyewear removed NA Nail polish cured greater than 10 hours NA Casting material cured greater than 10 hours NA Hearing aids removed Loose dentures or partials removed NA Prosthetics have been removed Patient demonstrates correct use of air break device (if  applicable) Patient concerns have been addressed Patient grounding bracelet on and cord attached to chamber Specifics for Inpatients (complete in addition to above) Medication sheet sent with patient Intravenous medications needed or due during therapy sent with patient Franchini, Aaric ([id number]) Drainage tubes (e.g. nasogastric tube or chest tube secured and vented) Endotracheal  or Tracheotomy tube secured Cuff deflated of air and inflated with saline Airway suctioned Electronic Signature(s) Signed: 09/26/2015 4:38:53 PM By: Lorine Bears RCP, RRT, CHT Entered By: Lorine Bears on 09/26/2015 10:31:30

## 2015-09-29 ENCOUNTER — Encounter: Payer: Federal, State, Local not specified - PPO | Admitting: Surgery

## 2015-09-29 DIAGNOSIS — N3041 Irradiation cystitis with hematuria: Secondary | ICD-10-CM | POA: Diagnosis not present

## 2015-09-29 DIAGNOSIS — R338 Other retention of urine: Secondary | ICD-10-CM | POA: Diagnosis not present

## 2015-09-29 DIAGNOSIS — Z8546 Personal history of malignant neoplasm of prostate: Secondary | ICD-10-CM | POA: Diagnosis not present

## 2015-09-29 DIAGNOSIS — L598 Other specified disorders of the skin and subcutaneous tissue related to radiation: Secondary | ICD-10-CM | POA: Diagnosis not present

## 2015-09-29 NOTE — Progress Notes (Signed)
[name], [name] ([id number]) Visit Report for 09/29/2015 Arrival Information Details Patient Name: [name], [name] Date of Service: 09/29/2015 10:00 AM Medical Record Number: [id number] Patient Account Number: [id number] Date of Birth/Sex: 1955-12-14 (60 y.o. Male) Treating RN: Primary Care Physician: Annye Asa Other Clinician: Jacqulyn Bath Referring Physician: Annye Asa Treating Physician/Extender: Frann Rider in Treatment: 4 Visit Information History Since Last Visit Added or deleted any medications: No Patient Arrived: Ambulatory Any new allergies or adverse reactions: No Arrival Time: 09:40 Had a fall or experienced change in No Accompanied By: self activities of daily living that may affect Transfer Assistance: None risk of falls: Patient Identification Verified: Yes Signs or symptoms of abuse/neglect since last No Secondary Verification Process Yes visito Completed: Hospitalized since last visit: No Patient Requires Transmission-Based No Pain Present Now: No Precautions: Patient Has Alerts: No Electronic Signature(s) Signed: 09/29/2015 3:38:01 PM By: Lorine Bears RCP, RRT, CHT Entered By: Lorine Bears on 09/29/2015 10:30:59 Murdo, Alverto ([id number]) -------------------------------------------------------------------------------- [place] Details Patient Name: [name] Date of Service: 09/29/2015 10:00 AM Medical Record Number: [id number] Patient Account Number: [id number] Date of Birth/Sex: [birth year] (60 y.o. Male) Treating RN: Primary Care Physician: Annye Asa Other Clinician: Jacqulyn Bath Referring Physician: Annye Asa Treating Physician/Extender: Frann Rider in Treatment: 4 Vital Signs Time Taken: 09:50 Temperature (F): 98.3 Height (in): 65 Pulse (bpm): 72 Weight (lbs): 162 Respiratory Rate (breaths/min): 16 Body Mass Index (BMI): 27 Blood Pressure (mmHg):  140/70 Reference Range: 80 - 120 mg / dl Electronic Signature(s) Signed: 09/29/2015 3:38:01 PM By: Lorine Bears RCP, RRT, CHT Entered By: Becky Sax, Amado Nash on 09/29/2015 10:31:29

## 2015-09-29 NOTE — Progress Notes (Signed)
SEPHIROTH, SNELL ([id number]) Visit Report for 09/29/2015 HBO Details Patient Name: [name], [name] Date of Service: 09/29/2015 10:00 AM Medical Record Number: [id number] Patient Account Number: [id number] Date of Birth/Sex: 03/14/1955 (60 y.o. 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 HBO Treatment Details Treatment Number: 19 Patient Type: Outpatient Chamber Type: Monoplace Chamber Serial #: [id number] Treatment Protocol: 2.0 ATA with 90 minutes oxygen, and no air breaks Treatment Details Compression Rate Down: 1.5 psi / minute De-Compression Rate Up: 1.5 psi / minute Air breaks and breathing Compress Tx Pressure periods Decompress Decompress Begins Reached (leave unused spaces Begins Ends blank) Chamber Pressure (ATA) 1 2 - - - - - - 2 1 Clock Time (24 hr) 10:20 10:31 - - - - - - 12:02 12:12 Treatment Length: 112 (minutes) Treatment Segments: 4 Capillary Blood Glucose Pre Capillary Blood Glucose (mg/dl): Post Capillary Blood Glucose (mg/dl): Vital Signs Capillary Blood Glucose Reference Range: 80 - 120 mg / dl HBO Diabetic Blood Glucose Intervention Range: <131 mg/dl or >249 mg/dl Time Vitals Blood Respiratory Capillary Blood Glucose Pulse Action Type: Pulse: Temperature: Taken: Pressure: Rate: Glucose (mg/dl): Meter #: Oximetry (%) Taken: Pre 09:50 140/70 72 16 98.3 Treatment Response Treatment Completion Status: Treatment Completed without Adverse Event [name], [name] ([id number]) HBO Attestation I certify that I supervised this HBO treatment in accordance with Medicare guidelines. A  trained Yes emergency response team is readily available per hospital policies and procedures. Continue HBOT as ordered. Yes Electronic Signature(s) Signed: 09/29/2015 12:19:50 PM By: Christin Fudge MD, FACS Entered By: Christin Fudge on 09/29/2015 12:19:50 [name], [name] ([id number]) -------------------------------------------------------------------------------- HBO Safety Checklist Details Patient Name: [name] Date of Service: 09/29/2015 10:00 AM Medical Record Number: [id number] Patient Account Number: [id number] Date of Birth/Sex: 1955/11/28 (60 y.o. Male) Treating RN: Primary Care Physician: Annye Asa Other Clinician: Jacqulyn Bath Referring Physician: Annye Asa Treating Physician/Extender: Frann Rider in Treatment: 4 HBO Safety Checklist Items Safety Checklist Consent Form Signed Patient voided / foley secured and emptied When did you last eato 09/28/15 pm Last dose of injectable or oral agent n/a NA Ostomy pouch emptied and vented if applicable NA All implantable devices assessed, documented and approved NA Intravenous access site secured and place Valuables secured Linens and cotton and cotton/polyester blend (less than 51% polyester) Personal oil-based products / skin lotions / body lotions removed NA Wigs or hairpieces removed NA Smoking or tobacco materials removed Books / newspapers / magazines / loose paper removed Cologne, aftershave, perfume and deodorant removed Jewelry removed (may wrap wedding band) NA Make-up removed NA Hair care products removed Battery operated devices (external) removed Heating patches and chemical warmers removed NA Titanium eyewear removed NA Nail polish cured greater than 10 hours NA Casting material cured greater than 10 hours NA Hearing aids removed Loose dentures or partials removed NA Prosthetics have been removed Patient demonstrates correct use of air break device (if applicable) Patient  concerns have been addressed Patient grounding bracelet on and cord attached to chamber Specifics for Inpatients (complete in addition to above) Medication sheet sent with patient Intravenous medications needed or due during therapy sent with patient [name], [name] ([id number]) Drainage tubes (e.g. nasogastric tube or chest tube secured and vented) Endotracheal or Tracheotomy tube secured Cuff deflated of  air and inflated with saline Airway suctioned Electronic Signature(s) Signed: 09/29/2015 3:38:01 PM By: Lorine Bears RCP, RRT, CHT Entered By: Becky Sax, Amado Nash on 09/29/2015 10:32:48

## 2015-09-30 ENCOUNTER — Encounter: Payer: Federal, State, Local not specified - PPO | Admitting: Internal Medicine

## 2015-09-30 DIAGNOSIS — Z8546 Personal history of malignant neoplasm of prostate: Secondary | ICD-10-CM | POA: Diagnosis not present

## 2015-09-30 DIAGNOSIS — R338 Other retention of urine: Secondary | ICD-10-CM | POA: Diagnosis not present

## 2015-09-30 DIAGNOSIS — L598 Other specified disorders of the skin and subcutaneous tissue related to radiation: Secondary | ICD-10-CM | POA: Diagnosis not present

## 2015-09-30 DIAGNOSIS — N3041 Irradiation cystitis with hematuria: Secondary | ICD-10-CM | POA: Diagnosis not present

## 2015-09-30 NOTE — Progress Notes (Addendum)
[name] ([id number]) Visit Report for 09/30/2015 Arrival Information Details Patient Name: [name], [name] Date of Service: 09/30/2015 8:45 AM Medical Record Patient Account Number: [id number] Number: Treating RN: Baruch Gouty, RN, BSN, Rita 03-27-1955 [phone] y.o. Other Clinician: Date of Birth/Sex: Male) Treating ROBSON, [place] Primary Care Physician: Annye Asa Physician/Extender: G Referring Physician: Letha Cape in Treatment: 5 Visit Information History Since Last Visit All ordered tests and consults were completed: No Patient Arrived: Ambulatory Added or deleted any medications: No Arrival Time: 09:55 Any new allergies or adverse reactions: No Accompanied By: self Had a fall or experienced change in No Transfer Assistance: Manual activities of daily living that may affect Patient Identification Verified: Yes risk of falls: Secondary Verification Process Yes Signs or symptoms of abuse/neglect since last No Completed: visito Patient Requires Transmission-Based No Pain Present Now: No Precautions: Patient Has Alerts: No Electronic Signature(s) Signed: 09/30/2015 9:55:53 AM By: Regan Lemming BSN, RN Entered By: Regan Lemming on 09/30/2015 09:55:53 Bateson, Dru ([id number]) -------------------------------------------------------------------------------- General Visit Notes Details Patient Name: [name] Date of Service: 09/30/2015 8:45 AM Medical Record Patient Account Number: [id number] Number: Treating RN: Baruch Gouty, RN, BSN, Rita 1955-02-12 [phone] y.o. Other Clinician: Date of Birth/Sex: Male) Treating ROBSON, [name] Primary Care Physician: Annye Asa Physician/Extender: G Referring Physician: Letha Cape in Treatment: 5 Notes MD seen patient for his 2 weeks follow up and evaluation. Per patient, he is voiding more on his own and self catherization has decreased. So far no bleeding noted in his urine. Will  Continue HBO and will reevaluate in 2 weeks per policy by MD. Electronic Signature(s) Signed: 09/30/2015 9:58:29 AM By: Regan Lemming BSN, RN Entered By: Regan Lemming on 09/30/2015 09:58:28 Lizak, [place] ([id number]) -------------------------------------------------------------------------------- Pain Assessment Details Patient Name: [name] Date of Service: 09/30/2015 8:45 AM Medical Record Patient Account Number: [id number] Number: Treating RN: Baruch Gouty, RN, BSN, Rita August 22, 1955 [phone] y.o. Other Clinician: Date of Birth/Sex: Male) Treating ROBSON, [place] Primary Care Physician: Annye Asa Physician/Extender: G Referring Physician: Letha Cape in Treatment: 5 Active Problems Location of Pain Severity and Description of Pain Patient Has Paino No Site Locations With Dressing Change: No Pain Management and Medication Current Pain Management: Electronic Signature(s) Signed: 09/30/2015 9:56:01 AM By: Regan Lemming BSN, RN Entered By: Regan Lemming on 09/30/2015 09:56:01 Grenz, Yoniel ([id number]) -------------------------------------------------------------------------------- [place] Details Patient Name: [name] Date of Service: 09/30/2015 8:45 AM Medical Record Patient Account Number: [id number] Number: Treating RN: Baruch Gouty, RN, BSN, Rita 1955/08/11 [phone] y.o. Other Clinician: Date of Birth/Sex: Male) Treating ROBSON, [name] Primary Care Physician: Annye Asa Physician/Extender: G Referring Physician: Letha Cape in Treatment: 5 Vital Signs Time Taken: 09:55 Temperature (F): 98.6 Height (in): 65 Pulse (bpm): 72 Weight (lbs): 162 Respiratory Rate (breaths/min): 16 Body Mass Index (BMI): 27 Blood Pressure (mmHg): 122/72 Reference Range: 80 - 120 mg / dl Electronic Signature(s) Signed: 09/30/2015 12:42:14 PM By: Lorine Bears RCP, RRT, CHT Entered By: Lorine Bears on 09/30/2015  11:00:21

## 2015-09-30 NOTE — Progress Notes (Signed)
[name], [name] ([id number]) Visit Report for 09/30/2015 Arrival Information Details Patient Name: [name], [name] Date of Service: 09/30/2015 10:00 AM Medical Record Patient Account Number: [id number] Number: Treating RN: Jan 17, 1955 (60 y.o. Other Clinician: Jacqulyn Bath Date of Birth/Sex: Male) Treating Dellia Nims [place] Primary Care Physician: Annye Asa Physician/Extender: G Referring Physician: Letha Cape in Treatment: 5 Visit Information History Since Last Visit Added or deleted any medications: No Patient Arrived: Ambulatory Any new allergies or adverse reactions: No Arrival Time: 09:55 Had a fall or experienced change in No Accompanied By: self activities of daily living that may affect Transfer Assistance: None risk of falls: Patient Identification Verified: Yes Signs or symptoms of abuse/neglect since last No Secondary Verification Process Yes visito Completed: Hospitalized since last visit: No Patient Requires Transmission-Based No Pain Present Now: No Precautions: Patient Has Alerts: No Electronic Signature(s) Signed: 09/30/2015 12:42:14 PM By: Lorine Bears RCP, RRT, CHT Entered By: Lorine Bears on 09/30/2015 11:01:22 [name], [name] ([id number]) -------------------------------------------------------------------------------- Encounter Discharge Information Details Patient Name: [name] Date of Service: 09/30/2015 10:00 AM Medical Record Patient Account Number: [id number] Number: Treating RN: Dec 30, 1955 (60 y.o. Other Clinician: Jacqulyn Bath Date of Birth/Sex: Male) Treating Dellia Nims [place] Primary Care Physician: Annye Asa Physician/Extender: G Referring Physician: Letha Cape in Treatment: 5 Encounter Discharge Information Items Discharge Pain Level: 0 Discharge Condition: Stable Ambulatory Status: Ambulatory Discharge Destination:  Home Private Transportation: Auto Accompanied By: self Schedule Follow-up Appointment: No Medication Reconciliation completed and No provided to Patient/Care Garrette Caine: Clinical Summary of Care: Notes Patient has an HBO treatment scheduled on 10/01/15 at 10:00 am. Electronic Signature(s) Signed: 09/30/2015 12:42:14 PM By: Lorine Bears RCP, RRT, CHT Entered By: Lorine Bears on 09/30/2015 12:39:45 [name], [name] ([id number]) -------------------------------------------------------------------------------- Vitals Details Patient Name: [name] Date of Service: 09/30/2015 10:00 AM Medical Record Patient Account Number: [id number] Number: Treating RN: February 27, 1955 (60 y.o. Other Clinician: Jacqulyn Bath Date of Birth/Sex: Male) Treating ROBSON, [place] Primary Care Physician: Annye Asa Physician/Extender: G Referring Physician: Letha Cape in Treatment: 5 Vital Signs Time Taken: 10:10 Temperature (F): 98.6 Height (in): 65 Pulse (bpm): 72 Weight (lbs): 162 Respiratory Rate (breaths/min): 16 Body Mass Index (BMI): 27 Blood Pressure (mmHg): 122/72 Reference Range: 80 - 120 mg / dl Electronic Signature(s) Signed: 09/30/2015 12:42:14 PM By: Lorine Bears RCP, RRT, CHT Entered By: Lorine Bears on 09/30/2015 11:01:55

## 2015-10-01 ENCOUNTER — Encounter: Payer: Federal, State, Local not specified - PPO | Admitting: Internal Medicine

## 2015-10-01 DIAGNOSIS — Z8546 Personal history of malignant neoplasm of prostate: Secondary | ICD-10-CM | POA: Diagnosis not present

## 2015-10-01 DIAGNOSIS — N3041 Irradiation cystitis with hematuria: Secondary | ICD-10-CM | POA: Diagnosis not present

## 2015-10-01 DIAGNOSIS — L598 Other specified disorders of the skin and subcutaneous tissue related to radiation: Secondary | ICD-10-CM | POA: Diagnosis not present

## 2015-10-01 DIAGNOSIS — R338 Other retention of urine: Secondary | ICD-10-CM | POA: Diagnosis not present

## 2015-10-01 NOTE — Progress Notes (Signed)
[name], [name] ([id number]) Visit Report for 09/30/2015 HBO Details Patient Name: [name], [name] Date of Service: 09/30/2015 10:00 AM Medical Record Patient Account Number: [id number] Number: Treating RN: [birth year] (60 y.o. 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 HBO Treatment Details Treatment Number: 20 Patient Type: Outpatient Chamber Type: Monoplace Chamber Serial #: [id number] Treatment Protocol: 2.0 ATA with 90 minutes oxygen, and no air breaks Treatment Details Compression Rate Down: 1.5 psi / minute De-Compression Rate Up: 1.5 psi / minute Air breaks and breathing Compress Tx Pressure periods Decompress Decompress Begins Reached (leave unused spaces Begins Ends blank) Chamber Pressure (ATA) 1 2 - - - - - - 2 1 Clock Time (24 hr) 10:25 10:37 - - - - - - 12:07 12:17 Treatment Length: 112 (minutes) Treatment Segments: 4 Capillary Blood Glucose Pre Capillary Blood Glucose (mg/dl): Post Capillary Blood Glucose (mg/dl): Vital Signs Capillary Blood Glucose Reference Range: 80 - 120 mg / dl HBO Diabetic Blood Glucose Intervention Range: <131 mg/dl or >249 mg/dl Time Vitals Blood Respiratory Capillary Blood Glucose Pulse Action Type: Pulse: Temperature: Taken: Pressure: Rate: Glucose (mg/dl): Meter #: Oximetry (%) Taken: Pre 10:10 122/72 72 16 98.6 Post 12:25 142/76 72 16 98.4 [name], [name] ([id number]) Treatment Response Treatment Completion Status: Treatment Completed without Adverse Event Physician Notes No concerns with treatment given HBO Attestation I certify that I  supervised this HBO treatment in accordance with Medicare guidelines. A trained Yes emergency response team is readily available per hospital policies and procedures. Continue HBOT as ordered. Yes Electronic Signature(s) Signed: 10/01/2015 8:02:21 AM By: Linton Ham MD Previous Signature: 09/30/2015 12:42:14 PM Version By: Lorine Bears RCP, RRT, CHT Entered By: Linton Ham on 09/30/2015 13:25:13 [name], [name] ([id number]) -------------------------------------------------------------------------------- HBO Safety Checklist Details Patient Name: [name] Date of Service: 09/30/2015 10:00 AM Medical Record Patient Account Number: [id number] Number: Treating RN: 09-30-1955 (60 y.o. Other Clinician: Jacqulyn Bath Date of Birth/Sex: Male) Treating ROBSON, [place] Primary Care Physician: Annye Asa Physician/Extender: G Referring Physician: Letha Cape in Treatment: 5 HBO Safety Checklist Items Safety Checklist Consent Form Signed Patient voided / foley secured and emptied When did you last eato 09/29/15 pm Last dose of injectable or oral agent n/a NA Ostomy pouch emptied and vented if applicable NA All implantable devices assessed, documented and approved NA Intravenous access site secured and place Valuables secured Linens and cotton and cotton/polyester blend (less than 51% polyester) Personal oil-based products / skin lotions / body lotions removed NA Wigs or hairpieces removed NA Smoking or tobacco materials removed Books / newspapers / magazines / loose paper removed Cologne, aftershave, perfume and deodorant removed Jewelry removed (may wrap wedding band) NA Make-up removed NA Hair care products removed Battery operated devices (external) removed Heating patches and chemical warmers removed NA Titanium eyewear removed NA Nail polish cured greater than 10 hours NA Casting material cured greater than 10  hours NA Hearing aids removed Loose dentures or partials removed NA Prosthetics have been removed Patient demonstrates correct use of air break device (if applicable) Patient concerns have been addressed Patient grounding bracelet on and cord attached to chamber Specifics for Inpatients (complete in addition to above) Medication sheet sent with patient [name], [name] ([id number]) Intravenous medications  needed or due during therapy sent with patient Drainage tubes (e.g. nasogastric tube or chest tube secured and vented) Endotracheal or Tracheotomy tube secured Cuff deflated of air and inflated with saline Airway suctioned Electronic Signature(s) Signed: 09/30/2015 12:42:14 PM By: Lorine Bears RCP, RRT, CHT Entered By: Lorine Bears on 09/30/2015 11:03:06

## 2015-10-01 NOTE — Progress Notes (Signed)
AYAD, FALLONE ([id number]) Visit Report for 09/30/2015 Chief Complaint Document Details Patient Name: [name], [name] Date of Service: 09/30/2015 8:45 AM Medical Record Patient Account Number: [id number] Number: Treating RN: Baruch Gouty, RN, BSN, Rita 07/13/1955 [phone] y.o. Other Clinician: Date of Birth/Sex: Male) Treating Ernesteen Mihalic Primary Care Physician: Annye Asa Physician/Extender: G Referring Physician: Letha Cape in Treatment: 5 Information Obtained from: Patient Chief Complaint 08/26/15; patient is referred for consideration of hyperbaric oxygen treatment secondary to radiation cystitis Electronic Signature(s) Signed: 10/01/2015 8:02:21 AM By: Linton Ham MD Entered By: Linton Ham on 09/30/2015 11:24:06 [name], [name] ([id number]) -------------------------------------------------------------------------------- HPI Details Patient Name: [name] Date of Service: 09/30/2015 8:45 AM Medical Record Patient Account Number: [id number] Number: Treating RN: Baruch Gouty, RN, BSN, Rita September 12, 1955 [phone] y.o. Other Clinician: Date of Birth/Sex: Male) Treating Quentin Shorey Primary Care Physician: Annye Asa Physician/Extender: G Referring Physician: Letha Cape in Treatment: 5 History of Present Illness HPI Description: 08/26/15; this is a patient who is been referred from his urologist Dr. 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 The patient was diagnosed as having adenocarcinoma of the prostate T-2 B N0 M0 Gleason score of 7 in 2013. This was discovered based on a screening PSA of 7.5, the patient was not symptomatic at that time he tells me. He elected to to undergo combination therapy. He received 4500 cGy terminal beam radiation over 5 weeks followed by I/125 seed implantation. Total of 57 seeds were placed with a total mCi dosage of 19.9 mCi.  He also received anti androgen therapy. The patient tells me he did quite well and really was asymptomatic until roughly 3-4 weeks ago when he developed acute urinary retention vomiting a trip to the emergency room. Apparently prior to the urinary retention he had passed a few small clots in his urine. He required a catheter placement on July 29. Bladder scan at that time showed greater than 500 cc of urine in his bladder. The catheter was removed however the patient could not void. He has since been doing in and out catheterizations. 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 Erythematous patches were noted within the bladder wall compatible with radiation cystitis. Urinalysis was obtained I don't have these results. The patient was started on Repaflo to help with the retention. He is still having some frank bleeding this morning according to his wife. He feels as though his spontaneous voiding is improving however he is still doing when necessary in and out caths 09/17/15; the patient is seen today for follow-up visit in conjunction with hyperbaric oxygen treatment for late effect radiation cystitis. He states after starting hyperbarics last week I believe he had what he felt was improved urine flow. However over the weekend he had a problem. Staining on 4:00 he did an in and out catheter for 400 cc of clear urine. 4 hours later he didn't another in and out catheter for grossly bloody urine. Later that evening he had to void but he could not because of blood clots and ended up in the emergency room where he had a painful set of procedures to put a catheter in place ultimately done by urology. He now has a indwelling Foley catheter, still passing some bloody urine and some blood clots in the leg bag. He was able to show this to me today. He has a follow-up with his urologist on Monday 09/30/15; the patient is seen today in  routine follow-up for his ongoing  hyperbaric oxygen treatment for late effect radiation cystitis. He has had a difficult time of late being in the ER on 2 occasions on 9/17 with urinary retention. This was in the face of an ongoing Foley catheter. The catheter was irrigated. He saw Dr. Yves Dill his urologist on 9/19. He had a TURP clot evacuation and bladder fulguration most of the bleeding appears to been in the prosthetic urethra although there was several large clots also present in the bladder. There was also several areas of hemorrhagic cystitis along the posterior wall which were fulgurated. He saw Dr. Yves Dill in follow-up yesterday. The Foley catheter was removed. He is able to void roughly 100 cc and then has 400 cc of urine via in and out catheterization. He has had no further bleeding. Feels HBO has made his spontaneous voiding improved Electronic Signature(s) [name], [name] ([id number]) Signed: 10/01/2015 8:02:21 AM By: Linton Ham MD Entered By: Linton Ham on 09/30/2015 11:28:33 [name], [name] ([id number]) -------------------------------------------------------------------------------- Physical Exam Details Patient Name: [name] Date of Service: 09/30/2015 8:45 AM Medical Record Patient Account Number: [id number] Number: Treating RN: Baruch Gouty, RN, BSN, Rita 1955-12-29 [phone] y.o. Other Clinician: Date of Birth/Sex: Male) Treating Shandora Koogler Primary Care Physician: Annye Asa Physician/Extender: G Referring Physician: Letha Cape in Treatment: 5 Constitutional Sitting or standing Blood Pressure is within target range for patient.. Pulse regular and within target range for patient.Marland Kitchen Respirations regular, non-labored and within target range.. Temperature is normal and within the target range for the patient.. Patient's appearance is neat and clean. Appears in no acute distress. Well nourished and well developed.. Notes GU; his bladder is not distended. Foley catheter has  been removed Electronic Signature(s) Signed: 10/01/2015 8:02:21 AM By: Linton Ham MD Entered By: Linton Ham on 09/30/2015 11:29:25 [name] ([id number]) -------------------------------------------------------------------------------- Problem List Details Patient Name: [name] Date of Service: 09/30/2015 8:45 AM Medical Record Patient Account Number: [id number] Number: Treating RN: Baruch Gouty, RN, BSN, Rita 08-07-1955 [phone] y.o. Other Clinician: Date of Birth/Sex: Male) Treating Shamonica Schadt Primary Care Physician: Annye Asa Physician/Extender: G Referring Physician: Letha Cape in Treatment: 5 Active Problems ICD-10 Encounter Code Description Active Date Diagnosis L59.8 Other specified disorders of the skin and subcutaneous 08/26/2015 Yes tissue related to radiation N30.41 Irradiation cystitis with hematuria 08/26/2015 Yes R33.8 Other retention of urine 08/26/2015 Yes Z85.46 Personal history of malignant neoplasm of prostate 08/26/2015 Yes Inactive Problems Resolved Problems Electronic Signature(s) Signed: 10/01/2015 8:02:21 AM By: Linton Ham MD Entered By: Linton Ham on 09/30/2015 11:23:41 [name], [name] ([id number]) -------------------------------------------------------------------------------- Progress Note Details Patient Name: [name] Date of Service: 09/30/2015 8:45 AM Medical Record Patient Account Number: [id number] Number: Treating RN: Baruch Gouty, RN, BSN, Rita 1955/11/14 [phone] y.o. Other Clinician: Date of Birth/Sex: Male) Treating Chaye Misch Primary Care Physician: Annye Asa Physician/Extender: G Referring Physician: Letha Cape in Treatment: 5 Subjective Chief Complaint Information obtained from Patient 08/26/15; patient is referred for consideration of hyperbaric oxygen treatment secondary to radiation cystitis History of Present Illness (HPI) 08/26/15; this is a  patient who is been referred from his urologist Dr. 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 The patient was diagnosed as having adenocarcinoma of the prostate T-2 B N0 M0 Gleason score of 7 in 2013. This was discovered based on a screening PSA of 7.5, the patient was not symptomatic at that time he tells me. He elected to to undergo combination therapy. He received  4500 cGy terminal beam radiation over 5 weeks followed by I/125 seed implantation. Total of 57 seeds were placed with a total mCi dosage of 19.9 mCi. He also received anti androgen therapy. The patient tells me he did quite well and really was asymptomatic until roughly 3-4 weeks ago when he developed acute urinary retention vomiting a trip to the emergency room. Apparently prior to the urinary retention he had passed a few small clots in his urine. He required a catheter placement on July 29. Bladder scan at that time showed greater than 500 cc of urine in his bladder. The catheter was removed however the patient could not void. He has since been doing in and out catheterizations. 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 Erythematous patches were noted within the bladder wall compatible with radiation cystitis. Urinalysis was obtained I don't have these results. The patient was started on Repaflo to help with the retention. He is still having some frank bleeding this morning according to his wife. He feels as though his spontaneous voiding is improving however he is still doing when necessary in and out caths 09/17/15; the patient is seen today for follow-up visit in conjunction with hyperbaric oxygen treatment for late effect radiation cystitis. He states after starting hyperbarics last week I believe he had what he felt was improved urine flow. However over the weekend he had a problem. Staining on 4:00 he did an in and  out catheter for 400 cc of clear urine. 4 hours later he didn't another in and out catheter for grossly bloody urine. Later that evening he had to void but he could not because of blood clots and ended up in the emergency room where he had a painful set of procedures to put a catheter in place ultimately done by urology. He now has a indwelling Foley catheter, still passing some bloody urine and some blood clots in the leg bag. He was able to show this to me today. He has a follow-up with his urologist on Monday 09/30/15; the patient is seen today in routine follow-up for his ongoing hyperbaric oxygen treatment for late effect radiation cystitis. He has had a difficult time of late being in the ER on 2 occasions on 9/17 with urinary retention. This was in the face of an ongoing Foley catheter. The catheter was irrigated. He saw Dr. Yves Dill his urologist on 9/19. He had a TURP clot evacuation and bladder fulguration most of the bleeding appears to been in the prosthetic urethra although there was several large clots also present in the bladder. There was also several areas of hemorrhagic cystitis along the posterior wall which were fulgurated. He [name], [name] ([id number]) saw Dr. Yves Dill in follow-up yesterday. The Foley catheter was removed. He is able to void roughly 100 cc and then has 400 cc of urine via in and out catheterization. He has had no further bleeding. Feels HBO has made his spontaneous voiding improved Objective Constitutional Sitting or standing Blood Pressure is within target range for patient.. Pulse regular and within target range for patient.Marland Kitchen Respirations regular, non-labored and within target range.. Temperature is normal and within the target range for the patient.. Patient's appearance is neat and clean. Appears in no acute distress. Well nourished and well developed.. Vitals Time Taken: 9:55 AM, Height: 65 in, Weight: 162 lbs, BMI: 27, Temperature: 98.6 F, Pulse: 72  bpm, Respiratory Rate: 16 breaths/min, Blood Pressure: 122/72 mmHg. General Notes: GU; his bladder is not  distended. Foley catheter has been removed Assessment Active Problems ICD-10 L59.8 - Other specified disorders of the skin and subcutaneous tissue related to radiation N30.41 - Irradiation cystitis with hematuria R33.8 - Other retention of urine Z85.46 - Personal history of malignant neoplasm of prostate Plan [name], [name] ([id number]) o #1 the patient is on 20 of 40 dives. I told him I am hopeful that any effective HBO should be within the next week to 2. Specifically I am looking for reduced evidence of frank hematuria. Electronic Signature(s) Signed: 10/01/2015 8:02:21 AM By: Linton Ham MD Entered By: Linton Ham on 09/30/2015 11:31:04

## 2015-10-02 ENCOUNTER — Encounter: Payer: Federal, State, Local not specified - PPO | Admitting: Surgery

## 2015-10-02 DIAGNOSIS — Z8546 Personal history of malignant neoplasm of prostate: Secondary | ICD-10-CM | POA: Diagnosis not present

## 2015-10-02 DIAGNOSIS — N3041 Irradiation cystitis with hematuria: Secondary | ICD-10-CM | POA: Diagnosis not present

## 2015-10-02 DIAGNOSIS — L598 Other specified disorders of the skin and subcutaneous tissue related to radiation: Secondary | ICD-10-CM | POA: Diagnosis not present

## 2015-10-02 DIAGNOSIS — R338 Other retention of urine: Secondary | ICD-10-CM | POA: Diagnosis not present

## 2015-10-02 NOTE — Progress Notes (Signed)
[name] ([id number]) Visit Report for 10/01/2015 Arrival Information Details Patient Name: [name], [name] Date of Service: 10/01/2015 10:00 AM Medical Record Patient Account Number: [id number] Number: Treating RN: 1955-03-27 (60 y.o. Other Clinician: Jacqulyn Bath Date of Birth/Sex: Male) Treating Dellia Nims [place] Primary Care Physician: Annye Asa Physician/Extender: G Referring Physician: Letha Cape in Treatment: 5 Visit Information History Since Last Visit Added or deleted any medications: No Patient Arrived: Ambulatory Any new allergies or adverse reactions: No Arrival Time: 09:40 Had a fall or experienced change in No Accompanied By: self activities of daily living that may affect Transfer Assistance: None risk of falls: Patient Identification Verified: Yes Signs or symptoms of abuse/neglect since last No Secondary Verification Process Yes visito Completed: Hospitalized since last visit: No Patient Requires Transmission-Based No Pain Present Now: No Precautions: Patient Has Alerts: No Electronic Signature(s) Signed: 10/01/2015 3:14:04 PM By: Lorine Bears RCP, RRT, CHT Entered By: Lorine Bears on 10/01/2015 09:53:25 [name] ([id number]) -------------------------------------------------------------------------------- Encounter Discharge Information Details Patient Name: [name] Date of Service: 10/01/2015 10:00 AM Medical Record Patient Account Number: [id number] Number: Treating RN: [birth year] (60 y.o. Other Clinician: Jacqulyn Bath Date of Birth/Sex: Male) Treating Dellia Nims [place] Primary Care Physician: Annye Asa Physician/Extender: G Referring Physician: Letha Cape in Treatment: 5 Encounter Discharge Information Items Discharge Pain Level: 0 Discharge Condition: Stable Ambulatory Status: Ambulatory Discharge Destination:  Home Private Transportation: Auto Accompanied By: self Schedule Follow-up Appointment: No Medication Reconciliation completed and No provided to Patient/Care Jaylanie Boschee: Clinical Summary of Care: Notes Patient has an HBO treatment scheduled on 10/02/15 at 10:00 am. Electronic Signature(s) Signed: 10/01/2015 3:14:04 PM By: Lorine Bears RCP, RRT, CHT Entered By: Lorine Bears on 10/01/2015 12:25:25 [name] ([id number]) -------------------------------------------------------------------------------- Vitals Details Patient Name: [name] Date of Service: 10/01/2015 10:00 AM Medical Record Patient Account Number: [id number] Number: Treating RN: Jun 24, 1955 (60 y.o. Other Clinician: Jacqulyn Bath Date of Birth/Sex: Male) Treating [name] Primary Care Physician: Annye Asa Physician/Extender: G Referring Physician: Letha Cape in Treatment: 5 Vital Signs Time Taken: 09:50 Temperature (F): 98.4 Height (in): 65 Pulse (bpm): 78 Weight (lbs): 162 Respiratory Rate (breaths/min): 16 Body Mass Index (BMI): 27 Blood Pressure (mmHg): 140/80 Reference Range: 80 - 120 mg / dl Electronic Signature(s) Signed: 10/01/2015 3:14:04 PM By: Lorine Bears RCP, RRT, CHT Entered By: Lorine Bears on 10/01/2015 10:55:06

## 2015-10-03 ENCOUNTER — Encounter: Payer: Federal, State, Local not specified - PPO | Admitting: Surgery

## 2015-10-03 DIAGNOSIS — C61 Malignant neoplasm of prostate: Secondary | ICD-10-CM | POA: Diagnosis not present

## 2015-10-03 DIAGNOSIS — R338 Other retention of urine: Secondary | ICD-10-CM | POA: Diagnosis not present

## 2015-10-03 DIAGNOSIS — R310 Gross hematuria: Secondary | ICD-10-CM | POA: Diagnosis not present

## 2015-10-03 DIAGNOSIS — L598 Other specified disorders of the skin and subcutaneous tissue related to radiation: Secondary | ICD-10-CM | POA: Diagnosis not present

## 2015-10-03 DIAGNOSIS — N3041 Irradiation cystitis with hematuria: Secondary | ICD-10-CM | POA: Diagnosis not present

## 2015-10-03 DIAGNOSIS — D400 Neoplasm of uncertain behavior of prostate: Secondary | ICD-10-CM | POA: Diagnosis not present

## 2015-10-03 DIAGNOSIS — R339 Retention of urine, unspecified: Secondary | ICD-10-CM | POA: Diagnosis not present

## 2015-10-03 DIAGNOSIS — Z8546 Personal history of malignant neoplasm of prostate: Secondary | ICD-10-CM | POA: Diagnosis not present

## 2015-10-03 NOTE — Progress Notes (Signed)
TEAGHAN, CLEMENSON ([id number]) Visit Report for 10/02/2015 Arrival Information Details Patient Name: [name], [name] Date of Service: 10/02/2015 10:00 AM Medical Record Number: [id number] Patient Account Number: [id number] Date of Birth/Sex: 03-01-1955 (60 y.o. Male) Treating RN: Primary Care Physician: Annye Asa Other Clinician: Jacqulyn Bath Referring Physician: Annye Asa Treating Physician/Extender: Frann Rider in Treatment: 5 Visit Information History Since Last Visit Added or deleted any medications: No Patient Arrived: Ambulatory Any new allergies or adverse reactions: No Arrival Time: 09:35 Had a fall or experienced change in No Accompanied By: self activities of daily living that may affect Transfer Assistance: None risk of falls: Patient Identification Verified: Yes Signs or symptoms of abuse/neglect since last No Secondary Verification Process Yes visito Completed: Hospitalized since last visit: No Patient Requires Transmission-Based No Pain Present Now: No Precautions: Patient Has Alerts: No Electronic Signature(s) Signed: 10/02/2015 3:35:39 PM By: Lorine Bears RCP, RRT, CHT Entered By: Lorine Bears on 10/02/2015 10:46:09 [name], [name] ([id number]) -------------------------------------------------------------------------------- Encounter Discharge Information Details Patient Name: [name] Date of Service: 10/02/2015 10:00 AM Medical Record Number: [id number] Patient Account Number: [id number] Date of Birth/Sex: 03/27/1955 (60 y.o. Male) Treating RN: Primary Care Physician: Annye Asa Other Clinician: Jacqulyn Bath Referring Physician: Annye Asa Treating Physician/Extender: Frann Rider in Treatment: 5 Encounter Discharge Information Items Discharge Pain Level: 0 Discharge Condition: Stable Ambulatory Status: Ambulatory Discharge Destination:  Home Private Transportation: Auto Accompanied By: self Schedule Follow-up Appointment: No Medication Reconciliation completed and No provided to Patient/Care [name]: Clinical Summary of Care: Notes Patient has an HBO treatment scheduled on 9/29/17at 10:00 am. Electronic Signature(s) Signed: 10/02/2015 4:22:53 PM By: Lorine Bears RCP, RRT, CHT Entered By: Lorine Bears on 10/02/2015 16:22:40 [name], [name] ([id number]) -------------------------------------------------------------------------------- Vitals Details Patient Name: [name] Date of Service: 10/02/2015 10:00 AM Medical Record Number: [id number] Patient Account Number: [id number] Date of Birth/Sex: 04-04-1955 (60 y.o. Male) Treating RN: Primary Care Physician: Annye Asa Other Clinician: Jacqulyn Bath Referring Physician: Annye Asa Treating Physician/Extender: Frann Rider in Treatment: 5 Vital Signs Time Taken: 09:45 Temperature (F): 98.3 Height (in): 65 Pulse (bpm): 72 Weight (lbs): 162 Respiratory Rate (breaths/min): 16 Body Mass Index (BMI): 27 Blood Pressure (mmHg): 130/70 Reference Range: 80 - 120 mg / dl Electronic Signature(s) Signed: 10/02/2015 3:35:39 PM By: Lorine Bears RCP, RRT, CHT Entered By: Lorine Bears on 10/02/2015 10:46:40

## 2015-10-03 NOTE — Progress Notes (Signed)
[name], [name] ([id number]) Visit Report for 10/02/2015 HBO Details Patient Name: [name], [name] Date of Service: 10/02/2015 10:00 AM Medical Record Number: [id number] Patient Account Number: [id number] Date of Birth/Sex: 09-20-1955 (60 y.o. 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 HBO Treatment Details Treatment Number: 22 Patient Type: Outpatient Chamber Type: Monoplace Chamber Serial #: [id number] Treatment Protocol: 2.0 ATA with 90 minutes oxygen, and no air breaks Treatment Details Compression Rate Down: 1.5 psi / minute De-Compression Rate Up: 1.5 psi / minute Air breaks and breathing Compress Tx Pressure periods Decompress Decompress Begins Reached (leave unused spaces Begins Ends blank) Chamber Pressure (ATA) 1 2 - - - - - - 2 1 Clock Time (24 hr) 10:22 10:34 - - - - - - 12:04 12:16 Treatment Length: 114 (minutes) Treatment Segments: 4 Capillary Blood Glucose Pre Capillary Blood Glucose (mg/dl): Post Capillary Blood Glucose (mg/dl): Vital Signs Capillary Blood Glucose Reference Range: 80 - 120 mg / dl HBO Diabetic Blood Glucose Intervention Range: <131 mg/dl or >249 mg/dl Time Vitals Blood Respiratory Capillary Blood Glucose Pulse Action Type: Pulse: Temperature: Taken: Pressure: Rate: Glucose (mg/dl): Meter #: Oximetry (%) Taken: Pre 09:45 130/70 72 16 98.3 Post 12:20 140/80 72 16 98.4 Treatment Response Treatment Completion Status: Treatment Completed without Adverse Event [name], [name] ([id number]) HBO Attestation I certify that I supervised this HBO treatment in accordance with Medicare  guidelines. A trained Yes emergency response team is readily available per hospital policies and procedures. Continue HBOT as ordered. Yes Electronic Signature(s) Signed: 10/02/2015 4:23:53 PM By: Christin Fudge MD, FACS Previous Signature: 10/02/2015 4:22:53 PM Version By: Lorine Bears RCP, RRT, CHT Previous Signature: 10/02/2015 1:29:46 PM Version By: Christin Fudge MD, FACS Entered By: Christin Fudge on 10/02/2015 16:23:53 Starr, Shaquel ([id number]) -------------------------------------------------------------------------------- HBO Safety Checklist Details Patient Name: [name] Date of Service: 10/02/2015 10:00 AM Medical Record Number: [id number] Patient Account Number: [id number] Date of Birth/Sex: [birth year] (60 y.o. Male) Treating RN: Primary Care Physician: Annye Asa Other Clinician: Jacqulyn Bath Referring Physician: Annye Asa Treating Physician/Extender: Frann Rider in Treatment: 5 HBO Safety Checklist Items Safety Checklist Consent Form Signed Patient voided / foley secured and emptied When did you last eato 10/01/15 pm Last dose of injectable or oral agent n/a NA Ostomy pouch emptied and vented if applicable NA All implantable devices assessed, documented and approved NA Intravenous access site secured and place Valuables secured Linens and cotton and cotton/polyester blend (less than 51% polyester) Personal oil-based products / skin lotions / body lotions removed NA Wigs or hairpieces removed NA Smoking or tobacco materials removed Books / newspapers / magazines / loose paper removed Cologne, aftershave, perfume and deodorant removed Jewelry removed (may wrap wedding band) NA Make-up removed NA Hair care products removed Battery operated devices (external) removed Heating patches and chemical warmers removed NA Titanium eyewear removed NA Nail polish cured greater than 10 hours NA Casting material cured greater  than 10 hours NA Hearing aids removed Loose dentures or partials removed NA Prosthetics have been removed Patient demonstrates correct use of air break device (if applicable) Patient concerns have been addressed Patient grounding bracelet on and cord attached to chamber Specifics for Inpatients (complete in addition to above) Medication sheet sent with patient Intravenous medications  needed or due during therapy sent with patient [name], [name] ([id number]) Drainage tubes (e.g. nasogastric tube or chest tube secured and vented) Endotracheal or Tracheotomy tube secured Cuff deflated of air and inflated with saline Airway suctioned Electronic Signature(s) Signed: 10/02/2015 1:29:34 PM By: Christin Fudge MD, FACS Entered By: Christin Fudge on 10/02/2015 13:29:33

## 2015-10-04 NOTE — Progress Notes (Signed)
KOHEN, CLEMENSEN ([id number]) Visit Report for 10/03/2015 HBO Details Patient Name: [name], [name] Date of Service: 10/03/2015 10:00 AM Medical Record Number: [id number] Patient Account Number: [id number] Date of Birth/Sex: [birth year] (60 y.o. 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 HBO Treatment Details Treatment Number: 23 Patient Type: Outpatient Chamber Type: Monoplace Chamber Serial #: [id number] Treatment Protocol: 2.0 ATA with 90 minutes oxygen, and no air breaks Treatment Details Compression Rate Down: 1.5 psi / minute De-Compression Rate Up: 1.5 psi / minute Air breaks and breathing Compress Tx Pressure periods Decompress Decompress Begins Reached (leave unused spaces Begins Ends blank) Chamber Pressure (ATA) 1 2 - - - - - - 2 1 Clock Time (24 hr) 10:20 10:32 - - - - - - 12:03 12:14 Treatment Length: 114 (minutes) Treatment Segments: 4 Capillary Blood Glucose Pre Capillary Blood Glucose (mg/dl): Post Capillary Blood Glucose (mg/dl): Vital Signs Capillary Blood Glucose Reference Range: 80 - 120 mg / dl HBO Diabetic Blood Glucose Intervention Range: <131 mg/dl or >249 mg/dl Time Vitals Blood Respiratory Capillary Blood Glucose Pulse Action Type: Pulse: Temperature: Taken: Pressure: Rate: Glucose (mg/dl): Meter #: Oximetry (%) Taken: Pre 10:08 130/80 72 16 97.8 Post 12:20 150/80 66 16 98.3 Treatment Response Treatment Completion Status: Treatment Completed without Adverse Event Iacobucci, Esaw ([id number]) Electronic Signature(s) Signed: 10/03/2015 3:24:38 PM By: Lorine Bears RCP,  RRT, CHT Signed: 10/03/2015 3:47:29 PM By: Christin Fudge MD, FACS Previous Signature: 10/03/2015 12:15:00 PM Version By: Christin Fudge MD, FACS Entered By: Lorine Bears on 10/03/2015 13:24:21 Kalt, Viraj ([id number]) -------------------------------------------------------------------------------- HBO Safety Checklist Details Patient Name: [name] Date of Service: 10/03/2015 10:00 AM Medical Record Number: [id number] Patient Account Number: [id number] Date of Birth/Sex: Apr 22, 1955 (60 y.o. Male) Treating RN: Primary Care Physician: Annye Asa Other Clinician: Jacqulyn Bath Referring Physician: Annye Asa Treating Physician/Extender: Frann Rider in Treatment: 5 HBO Safety Checklist Items Safety Checklist Consent Form Signed Patient voided / foley secured and emptied When did you last eato 10/02/15 pm Last dose of injectable or oral agent n/a NA Ostomy pouch emptied and vented if applicable NA All implantable devices assessed, documented and approved NA Intravenous access site secured and place Valuables secured Linens and cotton and cotton/polyester blend (less than 51% polyester) Personal oil-based products / skin lotions / body lotions removed NA Wigs or hairpieces removed NA Smoking or tobacco materials removed Books / newspapers / magazines / loose paper removed Cologne, aftershave, perfume and deodorant removed Jewelry removed (may wrap wedding band) NA Make-up removed NA Hair care products removed Battery operated devices (external) removed Heating patches and chemical warmers removed NA Titanium eyewear removed NA Nail polish cured greater than 10 hours NA Casting material cured greater than 10 hours NA Hearing aids removed Loose dentures or partials removed NA Prosthetics have been removed Patient demonstrates correct use of air break device (if applicable) Patient concerns have been addressed Patient grounding  bracelet on and cord attached to chamber Specifics for Inpatients (complete in addition to above) Medication sheet sent with patient Intravenous medications needed or due during therapy sent with patient BEDI, Koty ([id number]) Drainage tubes (e.g. nasogastric tube or chest tube secured and vented) Endotracheal or Tracheotomy tube secured Cuff deflated of air and inflated with saline  Airway suctioned Electronic Signature(s) Signed: 10/03/2015 3:24:38 PM By: Lorine Bears RCP, RRT, CHT Entered By: Lorine Bears on 10/03/2015 10:29:50

## 2015-10-04 NOTE — Progress Notes (Signed)
YURIEL, TORNO ([id number]) Visit Report for 10/03/2015 Arrival Information Details Patient Name: [name], [name] Date of Service: 10/03/2015 10:00 AM Medical Record Number: [id number] Patient Account Number: [id number] Date of Birth/Sex: [birth year] (60 y.o. Male) Treating RN: Primary Care Physician: Annye Asa Other Clinician: Jacqulyn Bath Referring Physician: Annye Asa Treating Physician/Extender: Frann Rider in Treatment: 5 Visit Information History Since Last Visit Added or deleted any medications: No Patient Arrived: Ambulatory Any new allergies or adverse reactions: No Arrival Time: 09:40 Had a fall or experienced change in No Accompanied By: self activities of daily living that may affect Transfer Assistance: None risk of falls: Patient Identification Verified: Yes Signs or symptoms of abuse/neglect since last No Secondary Verification Process Yes visito Completed: Hospitalized since last visit: No Patient Requires Transmission-Based No Pain Present Now: No Precautions: Patient Has Alerts: No Electronic Signature(s) Signed: 10/03/2015 3:24:38 PM By: Lorine Bears RCP, RRT, CHT Entered By: Becky Sax, Amado Nash on 10/03/2015 09:47:17 Tavella, Waller ([id number]) -------------------------------------------------------------------------------- Encounter Discharge Information Details Patient Name: [name] Date of Service: 10/03/2015 10:00 AM Medical Record Number: [id number] Patient Account Number: [id number] Date of Birth/Sex: 06-17-1955 (60 y.o. Male) Treating RN: Primary Care Physician: Annye Asa Other Clinician: Jacqulyn Bath Referring Physician: Annye Asa Treating Physician/Extender: Frann Rider in Treatment: 5 Encounter Discharge Information Items Discharge Pain Level: 0 Discharge Condition: Stable Ambulatory Status: Ambulatory Discharge Destination:  Home Private Transportation: Auto Accompanied By: self Schedule Follow-up Appointment: No Medication Reconciliation completed and No provided to Patient/Care Diane Hanel: Clinical Summary of Care: Notes Patient has an HBO treatment scheduled on 10/06/15 at 10:00 am. Electronic Signature(s) Signed: 10/03/2015 3:24:38 PM By: Lorine Bears RCP, RRT, CHT Entered By: Lorine Bears on 10/03/2015 13:25:29 [place], Brittan ([id number]) -------------------------------------------------------------------------------- Vitals Details Patient Name: [name] Date of Service: 10/03/2015 10:00 AM Medical Record Number: [id number] Patient Account Number: [id number] Date of Birth/Sex: 1955-05-07 (60 y.o. Male) Treating RN: Primary Care Physician: Annye Asa Other Clinician: Jacqulyn Bath Referring Physician: Annye Asa Treating Physician/Extender: Frann Rider in Treatment: 5 Vital Signs Time Taken: 10:08 Temperature (F): 97.8 Height (in): 65 Pulse (bpm): 72 Weight (lbs): 162 Respiratory Rate (breaths/min): 16 Body Mass Index (BMI): 27 Blood Pressure (mmHg): 130/80 Reference Range: 80 - 120 mg / dl Electronic Signature(s) Signed: 10/03/2015 3:24:38 PM By: Lorine Bears RCP, RRT, CHT Entered By: Becky Sax, Amado Nash on 10/03/2015 10:28:26

## 2015-10-06 ENCOUNTER — Encounter: Payer: Federal, State, Local not specified - PPO | Attending: Nurse Practitioner | Admitting: Nurse Practitioner

## 2015-10-06 DIAGNOSIS — R338 Other retention of urine: Secondary | ICD-10-CM | POA: Insufficient documentation

## 2015-10-06 DIAGNOSIS — L598 Other specified disorders of the skin and subcutaneous tissue related to radiation: Secondary | ICD-10-CM | POA: Diagnosis not present

## 2015-10-06 DIAGNOSIS — Z8546 Personal history of malignant neoplasm of prostate: Secondary | ICD-10-CM | POA: Insufficient documentation

## 2015-10-06 DIAGNOSIS — N3041 Irradiation cystitis with hematuria: Secondary | ICD-10-CM | POA: Insufficient documentation

## 2015-10-06 NOTE — Progress Notes (Signed)
CURRY, CONK ([id number]) Visit Report for 10/06/2015 HBO Details Patient Name: [name], [name] Date of Service: 10/06/2015 10:00 AM Medical Record Number: [id number] Patient Account Number: [id number] Date of Birth/Sex: [birth year] (60 y.o. 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 HBO Treatment Details Treatment Number: 24 Patient Type: Outpatient Chamber Type: Monoplace Chamber Serial #: [id number] Treatment Protocol: 2.0 ATA with 90 minutes oxygen, and no air breaks Treatment Details Compression Rate Down: 1.5 psi / minute De-Compression Rate Up: 1.5 psi / minute Air breaks and breathing Compress Tx Pressure periods Decompress Decompress Begins Reached (leave unused spaces Begins Ends blank) Chamber Pressure (ATA) 1 2 - - - - - - 2 1 Clock Time (24 hr) 09:17 09:27 - - - - - - 10:57 11:07 Treatment Length: 110 (minutes) Treatment Segments: 4 Capillary Blood Glucose Pre Capillary Blood Glucose (mg/dl): Post Capillary Blood Glucose (mg/dl): Vital Signs Capillary Blood Glucose Reference Range: 80 - 120 mg / dl HBO Diabetic Blood Glucose Intervention Range: <131 mg/dl or >249 mg/dl Time Vitals Blood Respiratory Capillary Blood Glucose Pulse Action Type: Pulse: Temperature: Taken: Pressure: Rate: Glucose (mg/dl): Meter #: Oximetry (%) Taken: Pre 09:05 124/82 72 16 98.2 Post 11:10 142/76 66 16 98.5 Treatment Response Treatment Completion Status: Treatment Completed without Adverse Event Koebel, Hayk ([id number]) HBO Attestation I certify that I supervised this HBO treatment in accordance with  Medicare guidelines. A trained Yes emergency response team is readily available per hospital policies and procedures. Continue HBOT as ordered. Yes Electronic Signature(s) Signed: 10/06/2015 12:27:14 PM By: Londell Moh FNP Previous Signature: 10/06/2015 12:20:51 PM Version By: Lorine Bears RCP, RRT, CHT Entered By: Londell Moh on 10/06/2015 12:27:13 Starzyk, Ladarian ([id number]) -------------------------------------------------------------------------------- HBO Safety Checklist Details Patient Name: [name] Date of Service: 10/06/2015 10:00 AM Medical Record Number: [id number] Patient Account Number: [id number] Date of Birth/Sex: 1955-07-02 (60 y.o. Male) Treating RN: Primary Care Physician: Annye Asa Other Clinician: Jacqulyn Bath Referring Physician: Annye Asa Treating Physician/Extender: Loistine Chance in Treatment: 5 HBO Safety Checklist Items Safety Checklist Consent Form Signed Patient voided / foley secured and emptied When did you last eato 10/05/15 pm Last dose of injectable or oral agent n/a NA Ostomy pouch emptied and vented if applicable NA All implantable devices assessed, documented and approved NA Intravenous access site secured and place Valuables secured Linens and cotton and cotton/polyester blend (less than 51% polyester) Personal oil-based products / skin lotions / body lotions removed NA Wigs or hairpieces removed NA Smoking or tobacco materials removed Books / newspapers / magazines / loose paper removed Cologne, aftershave, perfume and deodorant removed Jewelry removed (may wrap wedding band) NA Make-up removed NA Hair care products removed Battery operated devices (external) removed Heating patches and chemical warmers removed NA Titanium eyewear removed NA Nail polish cured greater than 10 hours NA Casting material cured greater than 10 hours NA Hearing aids removed Loose dentures or partials  removed NA Prosthetics have been removed Patient demonstrates correct use of air break device (if applicable) Patient concerns have been addressed Patient grounding bracelet on and cord attached to chamber Specifics for Inpatients (complete in addition to above) Medication sheet sent with patient Intravenous medications needed or due during therapy sent with patient LOU, Michel ([id number]) Drainage  tubes (e.g. nasogastric tube or chest tube secured and vented) Endotracheal or Tracheotomy tube secured Cuff deflated of air and inflated with saline Airway suctioned Electronic Signature(s) Signed: 10/06/2015 12:20:51 PM By: Lorine Bears RCP, RRT, CHT Entered By: Lorine Bears on 10/06/2015 09:20:43

## 2015-10-06 NOTE — Progress Notes (Signed)
[name], JAGGERS ([id number]) Visit Report for 10/06/2015 Arrival Information Details Patient Name: [name], [name] Date of Service: 10/06/2015 10:00 AM Medical Record Number: [id number] Patient Account Number: [id number] Date of Birth/Sex: [birth year] (60 y.o. Male) Treating RN: Primary Care Physician: Annye Asa Other Clinician: Jacqulyn Bath Referring Physician: Annye Asa Treating Physician/Extender: Loistine Chance in Treatment: 5 Visit Information History Since Last Visit Added or deleted any medications: No Patient Arrived: Ambulatory Any new allergies or adverse reactions: No Arrival Time: 09:05 Had a fall or experienced change in No Accompanied By: self activities of daily living that may affect Transfer Assistance: None risk of falls: Patient Identification Verified: Yes Signs or symptoms of abuse/neglect since last No Secondary Verification Process Yes visito Completed: Hospitalized since last visit: No Patient Requires Transmission-Based No Pain Present Now: No Precautions: Patient Has Alerts: No Electronic Signature(s) Signed: 10/06/2015 12:20:51 PM By: Lorine Bears RCP, RRT, CHT Entered By: Lorine Bears on 10/06/2015 09:19:17 [place], Dionicio ([id number]) -------------------------------------------------------------------------------- Encounter Discharge Information Details Patient Name: [name] Date of Service: 10/06/2015 10:00 AM Medical Record Number: [id number] Patient Account Number: [id number] Date of Birth/Sex: 02-28-1955 (60 y.o. Male) Treating RN: Primary Care Physician: Annye Asa Other Clinician: Jacqulyn Bath Referring Physician: Annye Asa Treating Physician/Extender: Loistine Chance in Treatment: 5 Encounter Discharge Information Items Discharge Pain Level: 0 Discharge Condition: Stable Ambulatory Status: Ambulatory Discharge Destination:  Home Private Transportation: Auto Accompanied By: self Schedule Follow-up Appointment: No Medication Reconciliation completed and No provided to Patient/Care Genevia Bouldin: Clinical Summary of Care: Notes Patient has an HBO treatment scheduled on 10/07/15 at 10:00 am. Electronic Signature(s) Signed: 10/06/2015 12:20:51 PM By: Lorine Bears RCP, RRT, CHT Entered By: Lorine Bears on 10/06/2015 11:38:26 Porada, Esmeralda ([id number]) -------------------------------------------------------------------------------- Vitals Details Patient Name: [name] Date of Service: 10/06/2015 10:00 AM Medical Record Number: [id number] Patient Account Number: [id number] Date of Birth/Sex: 11/05/1955 (60 y.o. Male) Treating RN: Primary Care Physician: Annye Asa Other Clinician: Jacqulyn Bath Referring Physician: Annye Asa Treating Physician/Extender: Loistine Chance in Treatment: 5 Vital Signs Time Taken: 09:05 Temperature (F): 98.2 Height (in): 65 Pulse (bpm): 72 Weight (lbs): 162 Respiratory Rate (breaths/min): 16 Body Mass Index (BMI): 27 Blood Pressure (mmHg): 124/82 Reference Range: 80 - 120 mg / dl Electronic Signature(s) Signed: 10/06/2015 12:20:51 PM By: Lorine Bears RCP, RRT, CHT Entered By: Becky Sax, Amado Nash on 10/06/2015 09:19:48

## 2015-10-07 ENCOUNTER — Encounter: Payer: Federal, State, Local not specified - PPO | Admitting: Physician Assistant

## 2015-10-07 DIAGNOSIS — L598 Other specified disorders of the skin and subcutaneous tissue related to radiation: Secondary | ICD-10-CM | POA: Diagnosis not present

## 2015-10-07 DIAGNOSIS — Z8546 Personal history of malignant neoplasm of prostate: Secondary | ICD-10-CM | POA: Diagnosis not present

## 2015-10-07 DIAGNOSIS — N3041 Irradiation cystitis with hematuria: Secondary | ICD-10-CM | POA: Diagnosis not present

## 2015-10-07 DIAGNOSIS — R338 Other retention of urine: Secondary | ICD-10-CM | POA: Diagnosis not present

## 2015-10-07 NOTE — Progress Notes (Signed)
[name], [name] ([id number]) Visit Report for 10/07/2015 Arrival Information Details Patient Name: [name], [name] Date of Service: 10/07/2015 10:00 AM Medical Record Number: [id number] Patient Account Number: [id number] Date of Birth/Sex: 16-Sep-1955 (60 y.o. Male) Treating RN: Primary Care Physician: Annye Asa Other Clinician: Jacqulyn Bath Referring Physician: Annye Asa Treating Physician/Extender: Melburn Hake, HOYT Weeks in Treatment: 6 Visit Information History Since Last Visit Added or deleted any medications: No Patient Arrived: Ambulatory Any new allergies or adverse reactions: No Arrival Time: 08:53 Had a fall or experienced change in No Accompanied By: self activities of daily living that may affect Transfer Assistance: None risk of falls: Patient Identification Verified: Yes Signs or symptoms of abuse/neglect since last No Secondary Verification Process Yes visito Completed: Hospitalized since last visit: No Patient Requires Transmission-Based No Pain Present Now: No Precautions: Patient Has Alerts: No Electronic Signature(s) Signed: 10/07/2015 12:21:25 PM By: Lorine Bears RCP, RRT, CHT Entered By: Lorine Bears on 10/07/2015 09:46:06 [name], [name] ([id number]) -------------------------------------------------------------------------------- Encounter Discharge Information Details Patient Name: [name] Date of Service: 10/07/2015 10:00 AM Medical Record Number: [id number] Patient Account Number: [id number] Date of Birth/Sex: [birth year] (60 y.o. Male) Treating RN: Primary Care Physician: Annye Asa Other Clinician: Jacqulyn Bath Referring Physician: Annye Asa Treating Physician/Extender: Melburn Hake, HOYT Weeks in Treatment: 6 Encounter Discharge Information Items Discharge Pain Level: 0 Discharge Condition: Stable Ambulatory Status: Ambulatory Discharge Destination:  Home Private Transportation: Auto Accompanied By: self Schedule Follow-up Appointment: No Medication Reconciliation completed and No provided to Patient/Care [name]: Clinical Summary of Care: Notes Patient has an HBO treatment scheduled on 10/08/15 at 10:00 am. Electronic Signature(s) Signed: 10/07/2015 12:21:25 PM By: Lorine Bears RCP, RRT, CHT Entered By: Lorine Bears on 10/07/2015 11:22:56 [name], [name] ([id number]) -------------------------------------------------------------------------------- Vitals Details Patient Name: [name] Date of Service: 10/07/2015 10:00 AM Medical Record Number: [id number] Patient Account Number: [id number] Date of Birth/Sex: Dec 10, 1955 (60 y.o. Male) Treating RN: Primary Care Physician: Annye Asa Other Clinician: Jacqulyn Bath Referring Physician: Annye Asa Treating Physician/Extender: Melburn Hake, HOYT Weeks in Treatment: 6 Vital Signs Time Taken: 08:55 Temperature (F): 98.3 Height (in): 65 Pulse (bpm): 78 Weight (lbs): 162 Respiratory Rate (breaths/min): 16 Body Mass Index (BMI): 27 Blood Pressure (mmHg): 130/84 Reference Range: 80 - 120 mg / dl Electronic Signature(s) Signed: 10/07/2015 12:21:25 PM By: Lorine Bears RCP, RRT, CHT Entered By: Lorine Bears on 10/07/2015 09:55:02

## 2015-10-08 ENCOUNTER — Encounter: Payer: Federal, State, Local not specified - PPO | Admitting: Physician Assistant

## 2015-10-08 DIAGNOSIS — R338 Other retention of urine: Secondary | ICD-10-CM | POA: Diagnosis not present

## 2015-10-08 DIAGNOSIS — L598 Other specified disorders of the skin and subcutaneous tissue related to radiation: Secondary | ICD-10-CM | POA: Diagnosis not present

## 2015-10-08 DIAGNOSIS — Z8546 Personal history of malignant neoplasm of prostate: Secondary | ICD-10-CM | POA: Diagnosis not present

## 2015-10-08 DIAGNOSIS — N3041 Irradiation cystitis with hematuria: Secondary | ICD-10-CM | POA: Diagnosis not present

## 2015-10-08 NOTE — Progress Notes (Signed)
[name], [name] ([id number]) Visit Report for 10/08/2015 Arrival Information Details Patient Name: [name], [name] Date of Service: 10/08/2015 10:00 AM Medical Record Number: [id number] Patient Account Number: [id number] Date of Birth/Sex: 08-Jul-1955 (60 y.o. Male) Treating RN: Primary Care Physician: Annye Asa Other Clinician: Jacqulyn Bath Referring Physician: Annye Asa Treating Physician/Extender: Melburn Hake, HOYT Weeks in Treatment: 6 Visit Information History Since Last Visit Added or deleted any medications: No Patient Arrived: Ambulatory Any new allergies or adverse reactions: No Arrival Time: 08:48 Had a fall or experienced change in No Accompanied By: self activities of daily living that may affect Transfer Assistance: None risk of falls: Patient Identification Verified: Yes Signs or symptoms of abuse/neglect since last No Secondary Verification Process Yes visito Completed: Hospitalized since last visit: No Patient Requires Transmission-Based No Pain Present Now: No Precautions: Patient Has Alerts: No Electronic Signature(s) Signed: 10/08/2015 2:31:42 PM By: Lorine Bears RCP, RRT, CHT Entered By: Lorine Bears on 10/08/2015 09:06:12 [name], [name] ([id number]) -------------------------------------------------------------------------------- Encounter Discharge Information Details Patient Name: [name] Date of Service: 10/08/2015 10:00 AM Medical Record Number: [id number] Patient Account Number: [id number] Date of Birth/Sex: 1955-06-28 (60 y.o. Male) Treating RN: Primary Care Physician: Annye Asa Other Clinician: Jacqulyn Bath Referring Physician: Annye Asa Treating Physician/Extender: Melburn Hake, HOYT Weeks in Treatment: 6 Encounter Discharge Information Items Discharge Pain Level: 0 Discharge Condition: Stable Ambulatory Status: Ambulatory Discharge Destination:  Home Private Transportation: Auto Accompanied By: self Schedule Follow-up Appointment: No Medication Reconciliation completed and No provided to Patient/Care [name]: Clinical Summary of Care: Notes Patient has an HBO treatment scheduled on 10/09/15 at 10:00 am. Electronic Signature(s) Signed: 10/08/2015 2:31:42 PM By: Lorine Bears RCP, RRT, CHT Entered By: Lorine Bears on 10/08/2015 11:14:41 [name], [name] ([id number]) -------------------------------------------------------------------------------- Vitals Details Patient Name: [name] Date of Service: 10/08/2015 10:00 AM Medical Record Number: [id number] Patient Account Number: [id number] Date of Birth/Sex: [birth year] (60 y.o. Male) Treating RN: Primary Care Physician: Annye Asa Other Clinician: Jacqulyn Bath Referring Physician: Annye Asa Treating Physician/Extender: Melburn Hake, HOYT Weeks in Treatment: 6 Vital Signs Time Taken: 08:50 Temperature (F): 98.4 Height (in): 65 Pulse (bpm): 66 Weight (lbs): 162 Respiratory Rate (breaths/min): 16 Body Mass Index (BMI): 27 Blood Pressure (mmHg): 142/86 Reference Range: 80 - 120 mg / dl Electronic Signature(s) Signed: 10/08/2015 2:31:42 PM By: Lorine Bears RCP, RRT, CHT Entered By: Lorine Bears on 10/08/2015 09:06:40

## 2015-10-09 ENCOUNTER — Encounter (HOSPITAL_BASED_OUTPATIENT_CLINIC_OR_DEPARTMENT_OTHER): Payer: Federal, State, Local not specified - PPO | Admitting: General Surgery

## 2015-10-09 DIAGNOSIS — L89810 Pressure ulcer of head, unstageable: Secondary | ICD-10-CM | POA: Insufficient documentation

## 2015-10-09 DIAGNOSIS — L598 Other specified disorders of the skin and subcutaneous tissue related to radiation: Secondary | ICD-10-CM

## 2015-10-09 DIAGNOSIS — N3041 Irradiation cystitis with hematuria: Secondary | ICD-10-CM | POA: Diagnosis not present

## 2015-10-09 DIAGNOSIS — R338 Other retention of urine: Secondary | ICD-10-CM | POA: Diagnosis not present

## 2015-10-09 DIAGNOSIS — Y842 Radiological procedure and radiotherapy as the cause of abnormal reaction of the patient, or of later complication, without mention of misadventure at the time of the procedure: Secondary | ICD-10-CM | POA: Insufficient documentation

## 2015-10-09 DIAGNOSIS — Z8546 Personal history of malignant neoplasm of prostate: Secondary | ICD-10-CM | POA: Diagnosis not present

## 2015-10-09 DIAGNOSIS — C61 Malignant neoplasm of prostate: Secondary | ICD-10-CM | POA: Diagnosis not present

## 2015-10-09 NOTE — Progress Notes (Signed)
JONNATHON, JAGIELSKI ([id number]) Visit Report for 10/08/2015 HBO Details Patient Name: [name], [name] Date of Service: 10/08/2015 10:00 AM Medical Record Number: [id number] Patient Account Number: [id number] Date of Birth/Sex: 1955/05/28 (60 y.o. 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 HBO Treatment Details Treatment Number: 26 Patient Type: Outpatient Chamber Type: Monoplace Chamber Serial #: [id number] Treatment Protocol: 2.0 ATA with 90 minutes oxygen, and no air breaks Treatment Details Compression Rate Down: 1.5 psi / minute De-Compression Rate Up: 1.5 psi / minute Air breaks and breathing Compress Tx Pressure periods Decompress Decompress Begins Reached (leave unused spaces Begins Ends blank) Chamber Pressure (ATA) 1 2 - - - - - - 2 1 Clock Time (24 hr) 09:04 08:14 - - - - - - 10:44 10:54 Treatment Length: 110 (minutes) Treatment Segments: 4 Capillary Blood Glucose Pre Capillary Blood Glucose (mg/dl): Post Capillary Blood Glucose (mg/dl): Vital Signs Capillary Blood Glucose Reference Range: 80 - 120 mg / dl HBO Diabetic Blood Glucose Intervention Range: <131 mg/dl or >249 mg/dl Time Vitals Blood Respiratory Capillary Blood Glucose Pulse Action Type: Pulse: Temperature: Taken: Pressure: Rate: Glucose (mg/dl): Meter #: Oximetry (%) Taken: Pre 08:50 142/86 66 16 98.4 Post 11:00 130/86 60 16 98.3 Treatment Response Treatment Completion Status: Treatment Completed without Adverse Event Double, Camdyn ([id number]) Electronic Signature(s) Signed: 10/08/2015 2:31:42 PM By: Lorine Bears RCP,  RRT, CHT Signed: 10/09/2015 1:37:32 AM By: Worthy Keeler PA-C Entered By: Lorine Bears on 10/08/2015 11:13:22 [name], [name] ([id number]) -------------------------------------------------------------------------------- HBO Safety Checklist Details Patient Name: [name] Date of Service: 10/08/2015 10:00 AM Medical Record Number: [id number] Patient Account Number: [id number] Date of Birth/Sex: 10-25-1955 (60 y.o. Male) Treating RN: Primary Care Physician: Annye Asa Other Clinician: Jacqulyn Bath Referring Physician: Annye Asa Treating Physician/Extender: Melburn Hake, HOYT Weeks in Treatment: 6 HBO Safety Checklist Items Safety Checklist Consent Form Signed Patient voided / foley secured and emptied When did you last eato 10/07/15 pm Last dose of injectable or oral agent n/a NA Ostomy pouch emptied and vented if applicable NA All implantable devices assessed, documented and approved NA Intravenous access site secured and place Valuables secured Linens and cotton and cotton/polyester blend (less than 51% polyester) Personal oil-based products / skin lotions / body lotions removed NA Wigs or hairpieces removed NA Smoking or tobacco materials removed Books / newspapers / magazines / loose paper removed Cologne, aftershave, perfume and deodorant removed Jewelry removed (may wrap wedding band) NA Make-up removed Hair care products removed Battery operated devices (external) removed Heating patches and chemical warmers removed NA Titanium eyewear removed NA Nail polish cured greater than 10 hours NA Casting material cured greater than 10 hours NA Hearing aids removed Loose dentures or partials removed NA Prosthetics have been removed Patient demonstrates correct use of air break device (if applicable) Patient concerns have been addressed Patient grounding bracelet on and cord attached to chamber Specifics for Inpatients (complete in addition  to above) Medication sheet sent with patient Intravenous medications needed or due during therapy sent with patient [name], [name] ([id number]) Drainage tubes (e.g. nasogastric tube or chest tube secured and vented) Endotracheal or Tracheotomy tube secured Cuff deflated of air and inflated with saline Airway suctioned Electronic Signature(s) Signed: 10/08/2015 2:31:42 PM By: Juleen China,  RCP,RRT,CHT, Sallie RCP, RRT, CHT Entered By: Lorine Bears on 10/08/2015 09:07:48

## 2015-10-09 NOTE — Progress Notes (Signed)
LARAMY, TREICHEL ([id number]) Visit Report for 10/07/2015 HBO Details Patient Name: [name], [name] Date of Service: 10/07/2015 10:00 AM Medical Record Number: [id number] Patient Account Number: [id number] Date of Birth/Sex: [birth year] (60 y.o. 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 HBO Treatment Details Treatment Number: 25 Patient Type: Outpatient Chamber Type: Monoplace Chamber Serial #: [id number] Treatment Protocol: 2.0 ATA with 90 minutes oxygen, and no air breaks Treatment Details Compression Rate Down: 1.5 psi / minute De-Compression Rate Up: 1.5 psi / minute Air breaks and breathing Compress Tx Pressure periods Decompress Decompress Begins Reached (leave unused spaces Begins Ends blank) Chamber Pressure (ATA) 1 2 - - - - - - 2 1 Clock Time (24 hr) 09:17 09:27 - - - - - - 10:58 11:09 Treatment Length: 112 (minutes) Treatment Segments: 4 Capillary Blood Glucose Pre Capillary Blood Glucose (mg/dl): Post Capillary Blood Glucose (mg/dl): Vital Signs Capillary Blood Glucose Reference Range: 80 - 120 mg / dl HBO Diabetic Blood Glucose Intervention Range: <131 mg/dl or >249 mg/dl Time Vitals Blood Respiratory Capillary Blood Glucose Pulse Action Type: Pulse: Temperature: Taken: Pressure: Rate: Glucose (mg/dl): Meter #: Oximetry (%) Taken: Pre 08:55 130/84 78 16 98.3 Post 11:12 170/82 72 16 98.4 Treatment Response Treatment Completion Status: Treatment Completed without Adverse Event [name], [name] ([id number]) Electronic Signature(s) Signed: 10/07/2015 12:21:25 PM By: Lorine Bears RCP,  RRT, CHT Signed: 10/09/2015 1:37:32 AM By: Worthy Keeler PA-C Entered By: Lorine Bears on 10/07/2015 11:20:40 [name], [name] ([id number]) -------------------------------------------------------------------------------- HBO Safety Checklist Details Patient Name: [name] Date of Service: 10/07/2015 10:00 AM Medical Record Number: [id number] Patient Account Number: [id number] Date of Birth/Sex: 1955/11/25 (60 y.o. Male) Treating RN: Primary Care Physician: Annye Asa Other Clinician: Jacqulyn Bath Referring Physician: Annye Asa Treating Physician/Extender: Melburn Hake, HOYT Weeks in Treatment: 6 HBO Safety Checklist Items Safety Checklist Consent Form Signed Patient voided / foley secured and emptied When did you last eato 10/06/15 pm Last dose of injectable or oral agent n/a NA Ostomy pouch emptied and vented if applicable NA All implantable devices assessed, documented and approved NA Intravenous access site secured and place Valuables secured Linens and cotton and cotton/polyester blend (less than 51% polyester) Personal oil-based products / skin lotions / body lotions removed NA Wigs or hairpieces removed NA Smoking or tobacco materials removed Books / newspapers / magazines / loose paper removed Cologne, aftershave, perfume and deodorant removed Jewelry removed (may wrap wedding band) NA Make-up removed Hair care products removed Battery operated devices (external) removed Heating patches and chemical warmers removed NA Titanium eyewear removed NA Nail polish cured greater than 10 hours NA Casting material cured greater than 10 hours NA Hearing aids removed Loose dentures or partials removed NA Prosthetics have been removed Patient demonstrates correct use of air break device (if applicable) Patient concerns have been addressed Patient grounding bracelet on and cord attached to chamber Specifics for Inpatients (complete in addition  to above) Medication sheet sent with patient Intravenous medications needed or due during therapy sent with patient [name], [name] ([id number]) Drainage tubes (e.g. nasogastric tube or chest tube secured and vented) Endotracheal or Tracheotomy tube secured Cuff deflated of air and inflated with saline Airway suctioned Electronic Signature(s) Signed: 10/07/2015 12:21:25 PM By: Juleen China,  RCP,RRT,CHT, Sallie RCP, RRT, CHT Entered By: Lorine Bears on 10/07/2015 09:57:01

## 2015-10-09 NOTE — Progress Notes (Signed)
Tolerated hbo well for radiation cystitis

## 2015-10-10 ENCOUNTER — Encounter: Payer: Federal, State, Local not specified - PPO | Admitting: Nurse Practitioner

## 2015-10-10 DIAGNOSIS — Z8546 Personal history of malignant neoplasm of prostate: Secondary | ICD-10-CM | POA: Diagnosis not present

## 2015-10-10 DIAGNOSIS — L598 Other specified disorders of the skin and subcutaneous tissue related to radiation: Secondary | ICD-10-CM | POA: Diagnosis not present

## 2015-10-10 DIAGNOSIS — N3041 Irradiation cystitis with hematuria: Secondary | ICD-10-CM | POA: Diagnosis not present

## 2015-10-10 DIAGNOSIS — R338 Other retention of urine: Secondary | ICD-10-CM | POA: Diagnosis not present

## 2015-10-10 NOTE — Progress Notes (Signed)
BELFORD, CASCANTE ([id number]) Visit Report for 10/09/2015 Arrival Information Details Patient Name: [name], [name] Date of Service: 10/09/2015 10:00 AM Medical Record Number: [id number] Patient Account Number: [id number] Date of Birth/Sex: Jul 05, 1955 (60 y.o. Male) Treating RN: Primary Care Physician: Annye Asa Other Clinician: Jacqulyn Bath Referring Physician: Annye Asa Treating Physician/Extender: Benjaman Pott in Treatment: 6 Visit Information History Since Last Visit Added or deleted any medications: No Patient Arrived: Ambulatory Any new allergies or adverse reactions: No Arrival Time: 08:30 Had a fall or experienced change in No Accompanied By: self activities of daily living that may affect Transfer Assistance: None risk of falls: Patient Identification Verified: Yes Signs or symptoms of abuse/neglect since last No Secondary Verification Process Yes visito Completed: Hospitalized since last visit: No Patient Requires Transmission-Based No Pain Present Now: No Precautions: Patient Has Alerts: No Electronic Signature(s) Signed: 10/09/2015 3:19:15 PM By: Lorine Bears RCP, RRT, CHT Entered By: Lorine Bears on 10/09/2015 08:42:38 [name] ([id number]) -------------------------------------------------------------------------------- Encounter Discharge Information Details Patient Name: [name] Date of Service: 10/09/2015 10:00 AM Medical Record Number: [id number] Patient Account Number: [id number] Date of Birth/Sex: 1955/04/04 (60 y.o. Male) Treating RN: Primary Care Physician: Annye Asa Other Clinician: Jacqulyn Bath Referring Physician: Annye Asa Treating Physician/Extender: Benjaman Pott in Treatment: 6 Encounter Discharge Information Items Discharge Pain Level: 0 Discharge Condition: Stable Ambulatory Status: Ambulatory Discharge Destination:  Home Private Transportation: Auto Accompanied By: self Schedule Follow-up Appointment: No Medication Reconciliation completed and No provided to Patient/Care [name]: Clinical Summary of Care: Notes Patient has an HBO treatment scheduled on 10/10/15 at 08:00 am. Electronic Signature(s) Signed: 10/09/2015 3:19:15 PM By: Lorine Bears RCP, RRT, CHT Entered By: Lorine Bears on 10/09/2015 [name] ([id number]) -------------------------------------------------------------------------------- Vitals Details Patient Name: [name] Date of Service: 10/09/2015 10:00 AM Medical Record Number: [id number] Patient Account Number: [id number] Date of Birth/Sex: 1955-10-03 (60 y.o. Male) Treating RN: Primary Care Physician: Annye Asa Other Clinician: Jacqulyn Bath Referring Physician: Annye Asa Treating Physician/Extender: Benjaman Pott in Treatment: 6 Vital Signs Time Taken: 08:33 Temperature (F): 98.2 Height (in): 65 Pulse (bpm): 78 Weight (lbs): 162 Respiratory Rate (breaths/min): 16 Body Mass Index (BMI): 27 Blood Pressure (mmHg): 152/70 Reference Range: 80 - 120 mg / dl Electronic Signature(s) Signed: 10/09/2015 3:19:15 PM By: Lorine Bears RCP, RRT, CHT Entered By: Lorine Bears on 10/09/2015 08:43:13

## 2015-10-10 NOTE — Progress Notes (Signed)
[name], [name] ([id number]) Visit Report for 10/01/2015 HBO Details Patient Name: [name], [name] Date of Service: 10/01/2015 10:00 AM Medical Record Patient Account Number: [id number] Number: Treating RN: February 15, 1955 (60 y.o. 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 HBO Treatment Details Treatment Number: 21 Patient Type: Outpatient Chamber Type: Monoplace Chamber Serial #: [id number] Treatment Protocol: 2.0 ATA with 90 minutes oxygen, and no air breaks Treatment Details Compression Rate Down: 1.5 psi / minute De-Compression Rate Up: 1.5 psi / minute Air breaks and breathing Compress Tx Pressure periods Decompress Decompress Begins Reached (leave unused spaces Begins Ends blank) Chamber Pressure (ATA) 1 2 - - - - - - 2 1 Clock Time (24 hr) 10:19 10:30 - - - - - - 12:00 12:12 Treatment Length: 113 (minutes) Treatment Segments: 4 Capillary Blood Glucose Pre Capillary Blood Glucose (mg/dl): Post Capillary Blood Glucose (mg/dl): Vital Signs Capillary Blood Glucose Reference Range: 80 - 120 mg / dl HBO Diabetic Blood Glucose Intervention Range: <131 mg/dl or >249 mg/dl Time Vitals Blood Respiratory Capillary Blood Glucose Pulse Action Type: Pulse: Temperature: Taken: Pressure: Rate: Glucose (mg/dl): Meter #: Oximetry (%) Taken: Pre 09:50 140/80 78 16 98.4 Post 12:15 158/80 72 16 98.4 [name], [name] ([id number]) Treatment Response Treatment Completion Status: Treatment Completed without Adverse Event Physician Notes No concerns with treatment given HBO Attestation I certify that I  supervised this HBO treatment in accordance with Medicare guidelines. A trained Yes emergency response team is readily available per hospital policies and procedures. Continue HBOT as ordered. Yes Electronic Signature(s) Signed: 10/10/2015 8:10:29 AM By: Linton Ham MD Previous Signature: 10/01/2015 3:14:04 PM Version By: Lorine Bears RCP, RRT, CHT Entered By: Linton Ham on 10/01/2015 17:51:20 Romo, Ester ([id number]) -------------------------------------------------------------------------------- HBO Safety Checklist Details Patient Name: [name] Date of Service: 10/01/2015 10:00 AM Medical Record Patient Account Number: [id number] Number: Treating RN: [birth year] (60 y.o. Other Clinician: Jacqulyn Bath Date of Birth/Sex: Male) Treating ROBSON, Yoncalla Primary Care Physician: Annye Asa Physician/Extender: G Referring Physician: Letha Cape in Treatment: 5 HBO Safety Checklist Items Safety Checklist Consent Form Signed Patient voided / foley secured and emptied When did you last eato 09/30/15 pm Last dose of injectable or oral agent n/a NA Ostomy pouch emptied and vented if applicable NA All implantable devices assessed, documented and approved NA Intravenous access site secured and place Valuables secured Linens and cotton and cotton/polyester blend (less than 51% polyester) Personal oil-based products / skin lotions / body lotions removed NA Wigs or hairpieces removed NA Smoking or tobacco materials removed Books / newspapers / magazines / loose paper removed Cologne, aftershave, perfume and deodorant removed Jewelry removed (may wrap wedding band) NA Make-up removed NA Hair care products removed Battery operated devices (external) removed Heating patches and chemical warmers removed NA Titanium eyewear removed NA Nail polish cured greater than 10 hours NA Casting material cured greater than 10 hours NA  Hearing aids removed Loose dentures or partials removed NA Prosthetics have been removed Patient demonstrates correct use of air break device (if applicable) Patient concerns have been addressed Patient grounding bracelet on and cord attached to chamber Specifics for Inpatients (complete in addition to above) Medication sheet sent with patient NAKASHIMA, Lenis ([id number]) Intravenous medications  needed or due during therapy sent with patient Drainage tubes (e.g. nasogastric tube or chest tube secured and vented) Endotracheal or Tracheotomy tube secured Cuff deflated of air and inflated with saline Airway suctioned Electronic Signature(s) Signed: 10/01/2015 3:14:04 PM By: Lorine Bears RCP, RRT, CHT Entered By: Lorine Bears on 10/01/2015 10:59:40

## 2015-10-10 NOTE — Progress Notes (Signed)
[name], [name] ([id number]) Visit Report for 10/09/2015 HBO Details Patient Name: [name], [name] Date of Service: 10/09/2015 10:00 AM Medical Record Number: [id number] Patient Account Number: [id number] Date of Birth/Sex: 06-20-1955 (60 y.o. 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 HBO Treatment Details Treatment Number: 27 Patient Type: Outpatient Chamber Type: Monoplace Chamber Serial #: [id number] Treatment Protocol: 2.0 ATA with 90 minutes oxygen, and no air breaks Treatment Details Compression Rate Down: 1.5 psi / minute De-Compression Rate Up: 1.5 psi / minute Air breaks and breathing Compress Tx Pressure periods Decompress Decompress Begins Reached (leave unused spaces Begins Ends blank) Chamber Pressure (ATA) 1 2 - - - - - - 2 1 Clock Time (24 hr) 08:39 08:49 - - - - - - 10:19 10:29 Treatment Length: 110 (minutes) Treatment Segments: 4 Capillary Blood Glucose Pre Capillary Blood Glucose (mg/dl): Post Capillary Blood Glucose (mg/dl): Vital Signs Capillary Blood Glucose Reference Range: 80 - 120 mg / dl HBO Diabetic Blood Glucose Intervention Range: <131 mg/dl or >249 mg/dl Time Vitals Blood Respiratory Capillary Blood Glucose Pulse Action Type: Pulse: Temperature: Taken: Pressure: Rate: Glucose (mg/dl): Meter #: Oximetry (%) Taken: Pre 08:33 152/70 78 16 98.2 Post 10:35 155/82 66 16 98.3 Treatment Response Treatment Completion Status: Treatment Completed without Adverse Event [name], [name] ([id number]) Electronic Signature(s) Signed: 10/09/2015 3:19:15 PM By: Lorine Bears RCP,  RRT, CHT Signed: 10/09/2015 3:59:06 PM By: Judene Companion MD Entered By: Lorine Bears on 10/09/2015 11:12:10 [name], [name] ([id number]) -------------------------------------------------------------------------------- HBO Safety Checklist Details Patient Name: [name] Date of Service: 10/09/2015 10:00 AM Medical Record Number: [id number] Patient Account Number: [id number] Date of Birth/Sex: 1955-07-31 (60 y.o. Male) Treating RN: Primary Care Physician: Annye Asa Other Clinician: Jacqulyn Bath Referring Physician: Annye Asa Treating Physician/Extender: Benjaman Pott in Treatment: 6 HBO Safety Checklist Items Safety Checklist Consent Form Signed Patient voided / foley secured and emptied When did you last eato 10/08/15 pm Last dose of injectable or oral agent n/a NA Ostomy pouch emptied and vented if applicable NA All implantable devices assessed, documented and approved NA Intravenous access site secured and place Valuables secured Linens and cotton and cotton/polyester blend (less than 51% polyester) Personal oil-based products / skin lotions / body lotions removed NA Wigs or hairpieces removed NA Smoking or tobacco materials removed Books / newspapers / magazines / loose paper removed Cologne, aftershave, perfume and deodorant removed Jewelry removed (may wrap wedding band) NA Make-up removed Hair care products removed Battery operated devices (external) removed Heating patches and chemical warmers removed NA Titanium eyewear removed NA Nail polish cured greater than 10 hours NA Casting material cured greater than 10 hours NA Hearing aids removed Loose dentures or partials removed NA Prosthetics have been removed Patient demonstrates correct use of air break device (if applicable) Patient concerns have been addressed Patient grounding bracelet on and cord attached to chamber Specifics for Inpatients (complete in addition to  above) Medication sheet sent with patient Intravenous medications needed or due during therapy sent with patient [name], [name] ([id number]) Drainage tubes (e.g. nasogastric tube or chest tube secured and vented) Endotracheal or Tracheotomy tube secured Cuff deflated of air and inflated with saline Airway suctioned Electronic Signature(s) Signed: 10/09/2015 3:19:15 PM By: Becky Sax, Sallie RCP,  RRT, CHT Entered By: Lorine Bears on 10/09/2015 08:44:08

## 2015-10-10 NOTE — Progress Notes (Addendum)
ARLEN, HAVRANEK ([id number]) Visit Report for 10/10/2015 Arrival Information Details Patient Name: [name], [name] Date of Service: 10/10/2015 8:00 AM Medical Record Number: [id number] Patient Account Number: [id number] Date of Birth/Sex: 1955/12/29 (60 y.o. Male) Treating RN: Cornell Barman Primary Care Physician: Annye Asa Other Clinician: Jacqulyn Bath Referring Physician: Annye Asa Treating Physician/Extender: Loistine Chance in Treatment: 6 Visit Information History Since Last Visit Added or deleted any medications: No Patient Arrived: Ambulatory Any new allergies or adverse reactions: No Arrival Time: 08:05 Had a fall or experienced change in No Accompanied By: self activities of daily living that may affect Transfer Assistance: None risk of falls: Patient Identification Verified: Yes Signs or symptoms of abuse/neglect since last No Secondary Verification Process Yes visito Completed: Hospitalized since last visit: No Patient Requires Transmission-Based No Pain Present Now: No Precautions: Patient Has Alerts: No Electronic Signature(s) Signed: 10/10/2015 8:06:34 AM By: Gretta Cool, RN, BSN, Kim RN, BSN Entered By: Gretta Cool, RN, BSN, Kim on 10/10/2015 08:06:34 [name] ([id number]) -------------------------------------------------------------------------------- Encounter Discharge Information Details Patient Name: [name] Date of Service: 10/10/2015 8:00 AM Medical Record Number: [id number] Patient Account Number: [id number] Date of Birth/Sex: 1955/02/01 (60 y.o. Male) Treating RN: Cornell Barman Primary Care Physician: Annye Asa Other Clinician: Jacqulyn Bath Referring Physician: Annye Asa Treating Physician/Extender: Loistine Chance in Treatment: 6 Encounter Discharge Information Items Discharge Pain Level: 0 Discharge Condition: Stable Ambulatory Status: Ambulatory Discharge Destination:  Home Private Transportation: Auto Accompanied By: self Schedule Follow-up Appointment: Yes Medication Reconciliation completed and Yes provided to Patient/Care Colton Engdahl: Clinical Summary of Care: Electronic Signature(s) Signed: 10/10/2015 10:42:25 AM By: Gretta Cool, RN, BSN, Kim RN, BSN Entered By: Gretta Cool, RN, BSN, Kim on 10/10/2015 10:42:24 [name] ([id number]) -------------------------------------------------------------------------------- Patient/Caregiver Education Details Patient Name: [name] Date of Service: 10/10/2015 8:00 AM Medical Record Number: [id number] Patient Account Number: [id number] Date of Birth/Gender: [birth year] (60 y.o. Male) Treating RN: Cornell Barman Primary Care Physician: Annye Asa Other Clinician: Jacqulyn Bath Referring Physician: Annye Asa Treating Physician/Extender: Loistine Chance in Treatment: 6 Education Assessment Education Provided To: Patient Education Topics Provided Hyperbaric Oxygenation: Handouts: Hyperbaric Oxygen Methods: Explain/Verbal Responses: State content correctly Electronic Signature(s) Signed: 10/10/2015 11:57:15 AM By: Gretta Cool, RN, BSN, Kim RN, BSN Entered By: Gretta Cool, RN, BSN, Kim on 10/10/2015 10:42:09 [name] ([id number]) -------------------------------------------------------------------------------- [place] Details Patient Name: [name] Date of Service: 10/10/2015 8:00 AM Medical Record Number: [id number] Patient Account Number: [id number] Date of Birth/Sex: 1955/09/07 (60 y.o. Male) Treating RN: Cornell Barman Primary Care Physician: Annye Asa Other Clinician: Jacqulyn Bath Referring Physician: Annye Asa Treating Physician/Extender: Loistine Chance in Treatment: 6 Vital Signs Time Taken: 08:00 Temperature (F): 98.2 Height (in): 65 Pulse (bpm): 68 Weight (lbs): 162 Respiratory Rate (breaths/min): 16 Body Mass Index (BMI): 27 Blood Pressure  (mmHg): 142/78 Reference Range: 80 - 120 mg / dl Electronic Signature(s) Signed: 10/10/2015 8:22:31 AM By: Gretta Cool, RN, BSN, Kim RN, BSN Entered By: Gretta Cool, RN, BSN, Kim on 10/10/2015 08:22:31

## 2015-10-11 NOTE — Progress Notes (Signed)
[name], [name] ([id number]) Visit Report for 10/10/2015 HBO Details Patient Name: [name], [name] Date of Service: 10/10/2015 8:00 AM Medical Record Number: [id number] Patient Account Number: [id number] Date of Birth/Sex: 04-18-1955 (60 y.o. 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 HBO Treatment Details Treatment Number: 28 Patient Type: Outpatient Chamber Type: Monoplace Chamber Serial #: [id number] Treatment Protocol: 2.0 ATA with 90 minutes oxygen, and no air breaks Treatment Details Compression Rate Down: 1.5 psi / minute De-Compression Rate Up: 1.5 psi / minute Air breaks and breathing Compress Tx Pressure periods Decompress Decompress Begins Reached (leave unused spaces Begins Ends blank) Chamber Pressure (ATA) 1 2 - - - - - - 2 1 Clock Time (24 hr) 08:20 08:30 - - - - - - 10:02 10:13 Treatment Length: 113 (minutes) Treatment Segments: 4 Capillary Blood Glucose Pre Capillary Blood Glucose (mg/dl): Post Capillary Blood Glucose (mg/dl): Vital Signs Capillary Blood Glucose Reference Range: 80 - 120 mg / dl HBO Diabetic Blood Glucose Intervention Range: <131 mg/dl or >249 mg/dl Time Vitals Blood Respiratory Capillary Blood Glucose Pulse Action Type: Pulse: Temperature: Taken: Pressure: Rate: Glucose (mg/dl): Meter #: Oximetry (%) Taken: Pre 08:00 142/78 68 16 98.2 Post 10:15 152/88 78 16 47 HBO Attestation [place], [place] ([id number]) I certify that I supervised this HBO treatment in Yes accordance with Medicare guidelines. A trained emergency response team is readily available  per hospital policies and procedures. Continue HBOT as ordered. Yes Electronic Signature(s) Signed: 10/10/2015 2:22:30 PM By: Londell Moh FNP Previous Signature: 10/10/2015 10:41:24 AM Version By: Gretta Cool RN, BSN, Kim RN, BSN Previous Signature: 10/10/2015 10:40:20 AM Version By: Gretta Cool, RN, BSN, Kim RN, BSN Previous Signature: 10/10/2015 8:33:02 AM Version By: Gretta Cool, RN, BSN, Kim RN, BSN Previous Signature: 10/10/2015 8:28:18 AM Version By: Gretta Cool, RN, BSN, Kim RN, BSN Previous Signature: 10/10/2015 8:23:57 AM Version By: Gretta Cool, RN, BSN, Kim RN, BSN Entered By: Londell Moh on 10/10/2015 14:22:29 [name], [name] ([id number]) -------------------------------------------------------------------------------- HBO Safety Checklist Details Patient Name: [name] Date of Service: 10/10/2015 8:00 AM Medical Record Number: [id number] Patient Account Number: [id number] Date of Birth/Sex: [birth year] (60 y.o. Male) Treating RN: Cornell Barman Primary Care Physician: Annye Asa Other Clinician: Jacqulyn Bath Referring Physician: Annye Asa Treating Physician/Extender: Loistine Chance in Treatment: 6 HBO Safety Checklist Items Safety Checklist Consent Form Signed NA Patient voided / foley secured and emptied When did you last eato 6:30 am Last dose of injectable or oral agent NA NA Ostomy pouch emptied and vented if applicable NA All implantable devices assessed, documented and approved NA Intravenous access site secured and place Valuables secured Linens and cotton and cotton/polyester blend (less than 51% polyester) Personal oil-based products / skin lotions / body lotions removed NA Wigs or hairpieces removed NA Smoking or tobacco materials removed Books / newspapers / magazines / loose paper removed Cologne, aftershave, perfume and deodorant removed Jewelry removed (may wrap wedding band) NA Make-up removed NA Hair care products removed NA Battery operated  devices (external) removed NA Heating patches and chemical warmers removed NA Titanium eyewear removed NA Nail polish cured greater than 10 hours NA Casting material cured greater than 10 hours NA Hearing aids removed NA Loose dentures or partials removed NA Prosthetics have been removed Patient  demonstrates correct use of air break device (if applicable) Patient concerns have been addressed Patient grounding bracelet on and cord attached to chamber Specifics for Inpatients (complete in addition to above) Medication sheet sent with patient Intravenous medications needed or due during therapy sent with patient [name], [name] ([id number]) Drainage tubes (e.g. nasogastric tube or chest tube secured and vented) Endotracheal or Tracheotomy tube secured Cuff deflated of air and inflated with saline Airway suctioned Electronic Signature(s) Signed: 10/10/2015 8:23:38 AM By: Gretta Cool, RN, BSN, Kim RN, BSN Entered By: Gretta Cool, RN, BSN, Kim on 10/10/2015 08:23:37

## 2015-10-13 ENCOUNTER — Encounter: Payer: Federal, State, Local not specified - PPO | Admitting: Nurse Practitioner

## 2015-10-13 DIAGNOSIS — Z8546 Personal history of malignant neoplasm of prostate: Secondary | ICD-10-CM | POA: Diagnosis not present

## 2015-10-13 DIAGNOSIS — N3041 Irradiation cystitis with hematuria: Secondary | ICD-10-CM | POA: Diagnosis not present

## 2015-10-13 DIAGNOSIS — R338 Other retention of urine: Secondary | ICD-10-CM | POA: Diagnosis not present

## 2015-10-13 DIAGNOSIS — L598 Other specified disorders of the skin and subcutaneous tissue related to radiation: Secondary | ICD-10-CM | POA: Diagnosis not present

## 2015-10-13 NOTE — Progress Notes (Signed)
[name], [name] ([id number]) Visit Report for 10/13/2015 Arrival Information Details Patient Name: [name], [name] Date of Service: 10/13/2015 10:00 AM Medical Record Number: [id number] Patient Account Number: [id number] Date of Birth/Sex: 1955/08/04 (60 y.o. Male) Treating RN: Primary Care Physician: Annye Asa Other Clinician: Jacqulyn Bath Referring Physician: Annye Asa Treating Physician/Extender: Loistine Chance in Treatment: 6 Visit Information History Since Last Visit Added or deleted any medications: No Patient Arrived: Ambulatory Any new allergies or adverse reactions: No Arrival Time: 08:00 Had a fall or experienced change in No Accompanied By: self activities of daily living that may affect Transfer Assistance: None risk of falls: Patient Identification Verified: Yes Hospitalized since last visit: No Secondary Verification Process Yes Pain Present Now: No Completed: Patient Requires Transmission-Based No Precautions: Patient Has Alerts: No Electronic Signature(s) Signed: 10/13/2015 12:11:03 PM By: Lorine Bears RCP, RRT, CHT Entered By: Lorine Bears on 10/13/2015 08:20:47 [name], [name] ([id number]) -------------------------------------------------------------------------------- Encounter Discharge Information Details Patient Name: [name] Date of Service: 10/13/2015 10:00 AM Medical Record Number: [id number] Patient Account Number: [id number] Date of Birth/Sex: 12-27-1955 (60 y.o. Male) Treating RN: Primary Care Physician: Annye Asa Other Clinician: Jacqulyn Bath Referring Physician: Annye Asa Treating Physician/Extender: Loistine Chance in Treatment: 6 Encounter Discharge Information Items Discharge Pain Level: 0 Discharge Condition: Stable Ambulatory Status: Ambulatory Discharge Destination: Home Private Transportation: Auto Accompanied By: self Schedule Follow-up  Appointment: No Medication Reconciliation completed and No provided to Patient/Care Yerlin Gasparyan: Clinical Summary of Care: Notes Patient has an HBO treatment scheduled on 10/14/15 at 08:00 am. Electronic Signature(s) Signed: 10/13/2015 12:11:03 PM By: Lorine Bears RCP, RRT, CHT Entered By: Lorine Bears on 10/13/2015 10:24:24 [name] ([id number]) -------------------------------------------------------------------------------- Vitals Details Patient Name: [name] Date of Service: 10/13/2015 10:00 AM Medical Record Number: [id number] Patient Account Number: [id number] Date of Birth/Sex: Jul 07, 1955 (60 y.o. Male) Treating RN: Primary Care Physician: Annye Asa Other Clinician: Jacqulyn Bath Referring Physician: Annye Asa Treating Physician/Extender: Loistine Chance in Treatment: 6 Vital Signs Time Taken: 08:00 Temperature (F): 98.5 Height (in): 65 Pulse (bpm): 78 Weight (lbs): 162 Respiratory Rate (breaths/min): 16 Body Mass Index (BMI): 27 Blood Pressure (mmHg): 136/76 Reference Range: 80 - 120 mg / dl Electronic Signature(s) Signed: 10/13/2015 12:11:03 PM By: Lorine Bears RCP, RRT, CHT Entered By: Becky Sax, Amado Nash on 10/13/2015 08:21:07

## 2015-10-13 NOTE — Progress Notes (Signed)
BRAYDAN, FINER ([id number]) Visit Report for 10/13/2015 HBO Details Patient Name: [name], [name] Date of Service: 10/13/2015 10:00 AM Medical Record Number: [id number] Patient Account Number: [id number] Date of Birth/Sex: [birth year] (60 y.o. 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 HBO Treatment Details Treatment Number: 29 Patient Type: Outpatient Chamber Type: Monoplace Chamber Serial #: [id number] Treatment Protocol: 2.0 ATA with 90 minutes oxygen, and no air breaks Treatment Details Compression Rate Down: 1.5 psi / minute De-Compression Rate Up: 1.5 psi / minute Air breaks and breathing Compress Tx Pressure periods Decompress Decompress Begins Reached (leave unused spaces Begins Ends blank) Chamber Pressure (ATA) 1 2 - - - - - - 2 1 Clock Time (24 hr) 08:17 08:27 - - - - - - 09:57 10:07 Treatment Length: 110 (minutes) Treatment Segments: 4 Capillary Blood Glucose Pre Capillary Blood Glucose (mg/dl): Post Capillary Blood Glucose (mg/dl): Vital Signs Capillary Blood Glucose Reference Range: 80 - 120 mg / dl HBO Diabetic Blood Glucose Intervention Range: <131 mg/dl or >249 mg/dl Time Vitals Blood Respiratory Capillary Blood Glucose Pulse Action Type: Pulse: Temperature: Taken: Pressure: Rate: Glucose (mg/dl): Meter #: Oximetry (%) Taken: Pre 08:00 136/76 78 16 98.5 Post 10:10 144/80 84 16 98.6 Treatment Response Treatment Completion Status: Treatment Completed without Adverse Event [name], [name] ([id number]) HBO Attestation I certify that I supervised this HBO treatment in accordance with  Medicare guidelines. A trained Yes emergency response team is readily available per hospital policies and procedures. Continue HBOT as ordered. Yes Electronic Signature(s) Signed: 10/13/2015 12:46:01 PM By: Londell Moh FNP Previous Signature: 10/13/2015 12:11:03 PM Version By: Lorine Bears RCP, RRT, CHT Entered By: Londell Moh on 10/13/2015 12:46:01 [name], [name] ([id number]) -------------------------------------------------------------------------------- HBO Safety Checklist Details Patient Name: [name] Date of Service: 10/13/2015 10:00 AM Medical Record Number: [id number] Patient Account Number: [id number] Date of Birth/Sex: [birth year] (60 y.o. Male) Treating RN: Primary Care Physician: Annye Asa Other Clinician: Jacqulyn Bath Referring Physician: Annye Asa Treating Physician/Extender: Loistine Chance in Treatment: 6 HBO Safety Checklist Items Safety Checklist Consent Form Signed Patient voided / foley secured and emptied When did you last eato 10/12/15 pm Last dose of injectable or oral agent n/a NA Ostomy pouch emptied and vented if applicable NA All implantable devices assessed, documented and approved NA Intravenous access site secured and place Valuables secured Linens and cotton and cotton/polyester blend (less than 51% polyester) Personal oil-based products / skin lotions / body lotions removed NA Wigs or hairpieces removed NA Smoking or tobacco materials removed Books / newspapers / magazines / loose paper removed Cologne, aftershave, perfume and deodorant removed Jewelry removed (may wrap wedding band) NA Make-up removed Hair care products removed Battery operated devices (external) removed Heating patches and chemical warmers removed NA Titanium eyewear removed NA Nail polish cured greater than 10 hours NA Casting material cured greater than 10 hours NA Hearing aids removed Loose dentures or partials  removed NA Prosthetics have been removed Patient demonstrates correct use of air break device (if applicable) Patient concerns have been addressed Patient grounding bracelet on and cord attached to chamber Specifics for Inpatients (complete in addition to above) Medication sheet sent with patient Intravenous medications needed or due during therapy sent with patient [name], [name] ([id number]) Drainage tubes (  e.g. nasogastric tube or chest tube secured and vented) Endotracheal or Tracheotomy tube secured Cuff deflated of air and inflated with saline Airway suctioned Electronic Signature(s) Signed: 10/13/2015 12:11:03 PM By: Lorine Bears RCP, RRT, CHT Entered By: Becky Sax, Amado Nash on 10/13/2015 08:22:08

## 2015-10-14 ENCOUNTER — Encounter: Payer: Federal, State, Local not specified - PPO | Admitting: Physician Assistant

## 2015-10-14 DIAGNOSIS — L598 Other specified disorders of the skin and subcutaneous tissue related to radiation: Secondary | ICD-10-CM | POA: Diagnosis not present

## 2015-10-14 DIAGNOSIS — Z8546 Personal history of malignant neoplasm of prostate: Secondary | ICD-10-CM | POA: Diagnosis not present

## 2015-10-14 DIAGNOSIS — N3041 Irradiation cystitis with hematuria: Secondary | ICD-10-CM | POA: Diagnosis not present

## 2015-10-14 DIAGNOSIS — R310 Gross hematuria: Secondary | ICD-10-CM | POA: Diagnosis not present

## 2015-10-14 DIAGNOSIS — R338 Other retention of urine: Secondary | ICD-10-CM | POA: Diagnosis not present

## 2015-10-14 NOTE — Progress Notes (Signed)
RANDEL, NARASIMHAN ([id number]) Visit Report for 10/14/2015 Arrival Information Details Patient Name: DEMAURY, BARAHONA Date of Service: 10/14/2015 8:00 AM Medical Record Number: [id number] Patient Account Number: [id number] Date of Birth/Sex: 1955/03/28 (60 y.o. Male) Treating RN: Primary Care Physician: Annye Asa Other Clinician: Jacqulyn Bath Referring Physician: Annye Asa Treating Physician/Extender: Melburn Hake, HOYT Weeks in Treatment: 7 Visit Information History Since Last Visit Added or deleted any medications: No Patient Arrived: Ambulatory Any new allergies or adverse reactions: No Arrival Time: 07:45 Had a fall or experienced change in No Accompanied By: self activities of daily living that may affect Transfer Assistance: None risk of falls: Patient Identification Verified: Yes Hospitalized since last visit: No Secondary Verification Process Yes Pain Present Now: No Completed: Patient Requires Transmission-Based No Precautions: Patient Has Alerts: No Electronic Signature(s) Signed: 10/14/2015 1:08:18 PM By: Lorine Bears RCP, RRT, CHT Entered By: Lorine Bears on 10/14/2015 08:33:36 [place], Jakobee ([id number]) -------------------------------------------------------------------------------- Encounter Discharge Information Details Patient Name: [name] Date of Service: 10/14/2015 8:00 AM Medical Record Number: [id number] Patient Account Number: [id number] Date of Birth/Sex: 04-27-1955 (60 y.o. Male) Treating RN: Primary Care Physician: Annye Asa Other Clinician: Jacqulyn Bath Referring Physician: Annye Asa Treating Physician/Extender: Melburn Hake, HOYT Weeks in Treatment: 7 Encounter Discharge Information Items Discharge Pain Level: 0 Discharge Condition: Stable Ambulatory Status: Ambulatory Other (Note Discharge Destination: Required) Transportation: Private Auto Accompanied By: self Schedule  Follow-up Appointment: No Medication Reconciliation completed and provided to Patient/Care No Amadi Frady: Clinical Summary of Care: Notes The Patient has a procedure at the hospital. Patient has an HBO treatment scheduled on10/11/17 at 10:00 am. Electronic Signature(s) Signed: 10/14/2015 1:08:18 PM By: Lorine Bears RCP, RRT, CHT Entered By: Lorine Bears on 10/14/2015 10:26:00 [place], Ranvir ([id number]) -------------------------------------------------------------------------------- Vitals Details Patient Name: [name] Date of Service: 10/14/2015 8:00 AM Medical Record Number: [id number] Patient Account Number: [id number] Date of Birth/Sex: Sep 22, 1955 (60 y.o. Male) Treating RN: Primary Care Physician: Annye Asa Other Clinician: Jacqulyn Bath Referring Physician: Annye Asa Treating Physician/Extender: Melburn Hake, HOYT Weeks in Treatment: 7 Vital Signs Time Taken: 07:45 Temperature (F): 98.2 Height (in): 65 Pulse (bpm): 72 Weight (lbs): 162 Respiratory Rate (breaths/min): 16 Body Mass Index (BMI): 27 Blood Pressure (mmHg): 122/68 Reference Range: 80 - 120 mg / dl Electronic Signature(s) Signed: 10/14/2015 1:08:18 PM By: Lorine Bears RCP, RRT, CHT Entered By: Becky Sax, Amado Nash on 10/14/2015 08:34:08

## 2015-10-15 ENCOUNTER — Encounter: Payer: Federal, State, Local not specified - PPO | Admitting: Physician Assistant

## 2015-10-15 DIAGNOSIS — Z8546 Personal history of malignant neoplasm of prostate: Secondary | ICD-10-CM | POA: Diagnosis not present

## 2015-10-15 DIAGNOSIS — N3041 Irradiation cystitis with hematuria: Secondary | ICD-10-CM | POA: Diagnosis not present

## 2015-10-15 DIAGNOSIS — R338 Other retention of urine: Secondary | ICD-10-CM | POA: Diagnosis not present

## 2015-10-15 DIAGNOSIS — L598 Other specified disorders of the skin and subcutaneous tissue related to radiation: Secondary | ICD-10-CM | POA: Diagnosis not present

## 2015-10-15 NOTE — Progress Notes (Signed)
[name], [name] ([id number]) Visit Report for 10/15/2015 Arrival Information Details Patient Name: [name], [name] Date of Service: 10/15/2015 10:00 AM Medical Record Number: [id number] Patient Account Number: [id number] Date of Birth/Sex: May 14, 1955 (60 y.o. Male) Treating RN: Primary Care Physician: Annye Asa Other Clinician: Jacqulyn Bath Referring Physician: Annye Asa Treating Physician/Extender: Melburn Hake, HOYT Weeks in Treatment: 7 Visit Information History Since Last Visit Added or deleted any medications: No Patient Arrived: Ambulatory Any new allergies or adverse reactions: No Arrival Time: 07:50 Had a fall or experienced change in No Accompanied By: self activities of daily living that may affect Transfer Assistance: None risk of falls: Secondary Verification Process Yes Signs or symptoms of abuse/neglect since last No Completed: visito Patient Requires Transmission-Based No Pain Present Now: No Precautions: Patient Has Alerts: No Electronic Signature(s) Signed: 10/15/2015 12:38:41 PM By: Lorine Bears RCP, RRT, CHT Entered By: Lorine Bears on 10/15/2015 08:12:12 [name], [name] ([id number]) -------------------------------------------------------------------------------- Encounter Discharge Information Details Patient Name: [name] Date of Service: 10/15/2015 10:00 AM Medical Record Number: [id number] Patient Account Number: [id number] Date of Birth/Sex: 23-Mar-1955 (60 y.o. Male) Treating RN: Primary Care Physician: Annye Asa Other Clinician: Jacqulyn Bath Referring Physician: Annye Asa Treating Physician/Extender: Melburn Hake, HOYT Weeks in Treatment: 7 Encounter Discharge Information Items Discharge Pain Level: 0 Discharge Condition: Stable Ambulatory Status: Ambulatory Discharge Destination: Home Private Transportation: Auto Accompanied By: self Schedule Follow-up Appointment:  No Medication Reconciliation completed and No provided to Patient/Care Bud Kaeser: Clinical Summary of Care: Notes Patient has an HBO treatment scheduled on 10/16/15 at 08:00 am. Electronic Signature(s) Signed: 10/15/2015 12:38:41 PM By: Lorine Bears RCP, RRT, CHT Entered By: Lorine Bears on 10/15/2015 11:35:35 [name], [name] ([id number]) -------------------------------------------------------------------------------- Vitals Details Patient Name: [name] Date of Service: 10/15/2015 10:00 AM Medical Record Number: [id number] Patient Account Number: [id number] Date of Birth/Sex: 19-Sep-1955 (60 y.o. Male) Treating RN: Primary Care Physician: Annye Asa Other Clinician: Jacqulyn Bath Referring Physician: Annye Asa Treating Physician/Extender: Melburn Hake, HOYT Weeks in Treatment: 7 Vital Signs Time Taken: 07:53 Temperature (F): 98.2 Height (in): 65 Pulse (bpm): 84 Weight (lbs): 162 Respiratory Rate (breaths/min): 16 Body Mass Index (BMI): 27 Blood Pressure (mmHg): 150/86 Reference Range: 80 - 120 mg / dl Electronic Signature(s) Signed: 10/15/2015 12:38:41 PM By: Lorine Bears RCP, RRT, CHT Entered By: [name], [name] on 10/15/2015 08:12:40

## 2015-10-15 NOTE — Progress Notes (Signed)
HORALD, [name] ([id number]) Visit Report for 10/14/2015 HBO Details Patient Name: [name], [name] Date of Service: 10/14/2015 8:00 AM Medical Record Number: [id number] Patient Account Number: [id number] Date of Birth/Sex: 12-06-1955 (60 y.o. 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 HBO Treatment Details Treatment Number: 30 Patient Type: Outpatient Chamber Type: Monoplace Chamber Serial #: [id number] Treatment Protocol: 2.0 ATA with 90 minutes oxygen, and no air breaks Treatment Details Compression Rate Down: 1.5 psi / minute De-Compression Rate Up: 1.5 psi / minute Air breaks and breathing Compress Tx Pressure periods Decompress Decompress Begins Reached (leave unused spaces Begins Ends blank) Chamber Pressure (ATA) 1 2 - - - - - - 2 1 Clock Time (24 hr) 08:06 08:16 - - - - - - 09:46 09:57 Treatment Length: 111 (minutes) Treatment Segments: 4 Capillary Blood Glucose Pre Capillary Blood Glucose (mg/dl): Post Capillary Blood Glucose (mg/dl): Vital Signs Capillary Blood Glucose Reference Range: 80 - 120 mg / dl HBO Diabetic Blood Glucose Intervention Range: <131 mg/dl or >249 mg/dl Time Vitals Blood Respiratory Capillary Blood Glucose Pulse Action Type: Pulse: Temperature: Taken: Pressure: Rate: Glucose (mg/dl): Meter #: Oximetry (%) Taken: Pre 07:45 122/68 72 16 98.2 Post 10:05 150/84 72 16 98.1 Treatment Response Treatment Completion Status: Treatment Completed without Adverse Event [place], Xavier ([id number]) Electronic Signature(s) Signed: 10/14/2015 1:08:18 PM By: Lorine Bears  RCP, RRT, CHT Signed: 10/15/2015 9:39:47 AM By: Worthy Keeler PA-C Entered By: Lorine Bears on 10/14/2015 10:24:13 [place]inders, Blanchard ([id number]) -------------------------------------------------------------------------------- HBO Safety Checklist Details Patient Name: [name] Date of Service: 10/14/2015 8:00 AM Medical Record Number: [id number] Patient Account Number: [id number] Date of Birth/Sex: March 25, 1955 (60 y.o. Male) Treating RN: Primary Care Physician: Annye Asa Other Clinician: Jacqulyn Bath Referring Physician: Annye Asa Treating Physician/Extender: Melburn Hake, HOYT Weeks in Treatment: 7 HBO Safety Checklist Items Safety Checklist Consent Form Signed Patient voided / foley secured and emptied When did you last eato 10/13/15 pm Last dose of injectable or oral agent n/a NA Ostomy pouch emptied and vented if applicable NA All implantable devices assessed, documented and approved NA Intravenous access site secured and place NA Valuables secured Linens and cotton and cotton/polyester blend (less than 51% polyester) Personal oil-based products / skin lotions / body lotions removed NA Wigs or hairpieces removed NA Smoking or tobacco materials removed Books / newspapers / magazines / loose paper removed Cologne, aftershave, perfume and deodorant removed Jewelry removed (may wrap wedding band) NA Make-up removed Hair care products removed Battery operated devices (external) removed Heating patches and chemical warmers removed NA Titanium eyewear removed NA Nail polish cured greater than 10 hours NA Casting material cured greater than 10 hours NA Hearing aids removed Loose dentures or partials removed NA Prosthetics have been removed Patient demonstrates correct use of air break device (if applicable) Patient concerns have been addressed Patient grounding bracelet on and cord attached to chamber Specifics for Inpatients (complete  in addition to above) Medication sheet sent with patient Intravenous medications needed or due during therapy sent with patient BERNSON, Bernice ([id number]) Drainage tubes (e.g. nasogastric tube or chest tube secured and vented) Endotracheal or Tracheotomy tube secured Cuff deflated of air and inflated with saline Airway suctioned Electronic Signature(s) Signed: 10/14/2015 1:08:18 PM By:  Becky Sax, Sallie RCP, RRT, CHT Entered By: Lorine Bears on 10/14/2015 08:35:59

## 2015-10-16 ENCOUNTER — Encounter (HOSPITAL_BASED_OUTPATIENT_CLINIC_OR_DEPARTMENT_OTHER): Payer: Federal, State, Local not specified - PPO | Admitting: General Surgery

## 2015-10-16 DIAGNOSIS — N3041 Irradiation cystitis with hematuria: Secondary | ICD-10-CM

## 2015-10-16 DIAGNOSIS — L598 Other specified disorders of the skin and subcutaneous tissue related to radiation: Secondary | ICD-10-CM | POA: Diagnosis not present

## 2015-10-16 DIAGNOSIS — Z8546 Personal history of malignant neoplasm of prostate: Secondary | ICD-10-CM | POA: Diagnosis not present

## 2015-10-16 DIAGNOSIS — R338 Other retention of urine: Secondary | ICD-10-CM | POA: Diagnosis not present

## 2015-10-16 NOTE — Progress Notes (Signed)
See I heal 

## 2015-10-16 NOTE — Progress Notes (Signed)
DAIWIK, BEARDSLEE ([id number]) Visit Report for 10/15/2015 HBO Details Patient Name: [name], [name] Date of Service: 10/15/2015 10:00 AM Medical Record Number: [id number] Patient Account Number: [id number] Date of Birth/Sex: 22-Jun-1955 (60 y.o. 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 HBO Treatment Details Treatment Number: 31 Patient Type: Outpatient Chamber Type: Monoplace Chamber Serial #: [id number] Treatment Protocol: 2.0 ATA with 90 minutes oxygen, and no air breaks Treatment Details Compression Rate Down: 1.5 psi / minute De-Compression Rate Up: 1.5 psi / minute Air breaks and breathing Compress Tx Pressure periods Decompress Decompress Begins Reached (leave unused spaces Begins Ends blank) Chamber Pressure (ATA) 1 2 - - - - - - 2 1 Clock Time (24 hr) 08:08 08:18 - - - - - - 09:48 09:58 Treatment Length: 110 (minutes) Treatment Segments: 4 Capillary Blood Glucose Pre Capillary Blood Glucose (mg/dl): Post Capillary Blood Glucose (mg/dl): Vital Signs Capillary Blood Glucose Reference Range: 80 - 120 mg / dl HBO Diabetic Blood Glucose Intervention Range: <131 mg/dl or >249 mg/dl Time Vitals Blood Respiratory Capillary Blood Glucose Pulse Action Type: Pulse: Temperature: Taken: Pressure: Rate: Glucose (mg/dl): Meter #: Oximetry (%) Taken: Pre 07:53 150/86 84 16 98.2 Post 10:07 150/90 66 16 98.2 Treatment Response Treatment Completion Status: Treatment Completed without Adverse Event Arcia, Leng ([id number]) Electronic Signature(s) Signed: 10/15/2015 12:38:41 PM By: Lorine Bears  RCP, RRT, CHT Signed: 10/15/2015 4:52:13 PM By: Worthy Keeler PA-C Previous Signature: 10/15/2015 9:39:47 AM Version By: Worthy Keeler PA-C Entered By: Lorine Bears on 10/15/2015 11:33:12 [place], Keyan ([id number]) -------------------------------------------------------------------------------- HBO Safety Checklist Details Patient Name: [name] Date of Service: 10/15/2015 10:00 AM Medical Record Number: [id number] Patient Account Number: [id number] Date of Birth/Sex: Jul 18, 1955 (60 y.o. Male) Treating RN: Primary Care Physician: Annye Asa Other Clinician: Jacqulyn Bath Referring Physician: Annye Asa Treating Physician/Extender: Melburn Hake, HOYT Weeks in Treatment: 7 HBO Safety Checklist Items Safety Checklist Consent Form Signed Patient voided / foley secured and emptied When did you last eato 10/14/15 pm Last dose of injectable or oral agent n/a NA Ostomy pouch emptied and vented if applicable NA All implantable devices assessed, documented and approved NA Intravenous access site secured and place Valuables secured Linens and cotton and cotton/polyester blend (less than 51% polyester) Personal oil-based products / skin lotions / body lotions removed NA Wigs or hairpieces removed NA Smoking or tobacco materials removed Books / newspapers / magazines / loose paper removed Cologne, aftershave, perfume and deodorant removed Jewelry removed (may wrap wedding band) NA Make-up removed NA Hair care products removed Battery operated devices (external) removed Heating patches and chemical warmers removed NA Titanium eyewear removed NA Nail polish cured greater than 10 hours NA Casting material cured greater than 10 hours NA Hearing aids removed Loose dentures or partials removed NA Prosthetics have been removed Patient demonstrates correct use of air break device (if applicable) Patient concerns have been addressed Patient grounding  bracelet on and cord attached to chamber Specifics for Inpatients (complete in addition to above) Medication sheet sent with patient Intravenous medications needed or due during therapy sent with patient LIEBAU, Karim ([id number]) Drainage tubes (e.g. nasogastric tube or chest tube secured and vented) Endotracheal or Tracheotomy tube secured Cuff deflated of air and inflated  with saline Airway suctioned Electronic Signature(s) Signed: 10/15/2015 12:38:41 PM By: Lorine Bears RCP, RRT, CHT Entered By: Lorine Bears on 10/15/2015 08:13:35

## 2015-10-17 ENCOUNTER — Encounter: Payer: Federal, State, Local not specified - PPO | Admitting: Nurse Practitioner

## 2015-10-17 DIAGNOSIS — R338 Other retention of urine: Secondary | ICD-10-CM | POA: Diagnosis not present

## 2015-10-17 DIAGNOSIS — N3041 Irradiation cystitis with hematuria: Secondary | ICD-10-CM | POA: Diagnosis not present

## 2015-10-17 DIAGNOSIS — L598 Other specified disorders of the skin and subcutaneous tissue related to radiation: Secondary | ICD-10-CM | POA: Diagnosis not present

## 2015-10-17 DIAGNOSIS — D400 Neoplasm of uncertain behavior of prostate: Secondary | ICD-10-CM | POA: Diagnosis not present

## 2015-10-17 DIAGNOSIS — Z8546 Personal history of malignant neoplasm of prostate: Secondary | ICD-10-CM | POA: Diagnosis not present

## 2015-10-17 DIAGNOSIS — N1339 Other hydronephrosis: Secondary | ICD-10-CM | POA: Diagnosis not present

## 2015-10-17 NOTE — Progress Notes (Signed)
[name] [name] ([id number]) Visit Report for 10/16/2015 HBO Details Patient Name: [name] [name] [name] [name] Date of Service: 10/16/2015 8:00 AM Medical Record Number: [id number] Patient Account Number: [id number] Date of Birth/Sex: 01/18/1955 (60 y.o. 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 HBO Treatment Details Treatment Number: 32 Patient Type: Outpatient Chamber Type: Monoplace Chamber Serial #: [id number] Treatment Protocol: 2.0 ATA with 90 minutes oxygen, and no air breaks Treatment Details Compression Rate Down: 1.5 psi / minute De-Compression Rate Up: 1.5 psi / minute Air breaks and breathing Compress Tx Pressure periods Decompress Decompress Begins Reached (leave unused spaces Begins Ends blank) Chamber Pressure (ATA) 1 2 - - - - - - 2 1 Clock Time (24 hr) 08:00 08:10 - - - - - - 09:40 09:50 Treatment Length: 110 (minutes) Treatment Segments: 4 Capillary Blood Glucose Pre Capillary Blood Glucose (mg/dl): Post Capillary Blood Glucose (mg/dl): Vital Signs Capillary Blood Glucose Reference Range: 80 - 120 mg / dl HBO Diabetic Blood Glucose Intervention Range: <131 mg/dl or >249 mg/dl Time Vitals Blood Respiratory Capillary Blood Glucose Pulse Action Type: Pulse: Temperature: Taken: Pressure: Rate: Glucose (mg/dl): Meter #: Oximetry (%) Taken: Pre 07:50 132/72 72 16 98.1 Post 09:58 132/72 72 16 97.9 Treatment Response Treatment Completion Status: Treatment Completed without Adverse Event [name] [name] [name] [name] ([id number]) Electronic Signature(s) Signed: 10/16/2015 3:04:29 PM By: Lorine Bears RCP,  RRT, CHT Signed: 10/16/2015 3:41:47 PM By: Judene Companion MD Entered By: Lorine Bears on 10/16/2015 10:23:13 [name] [name] ([id number]) -------------------------------------------------------------------------------- HBO Safety Checklist Details Patient Name: [name] [name] Date of Service: 10/16/2015 8:00 AM Medical Record Number: [id number] Patient Account Number: [id number] Date of Birth/Sex: 1955/01/11 (60 y.o. Male) Treating RN: Primary Care Physician: Annye Asa Other Clinician: Jacqulyn Bath Referring Physician: Annye Asa Treating Physician/Extender: Benjaman Pott in Treatment: 7 HBO Safety Checklist Items Safety Checklist Consent Form Signed Patient voided / foley secured and emptied When did you last eato 10/15/15 pm Last dose of injectable or oral agent n/a NA Ostomy pouch emptied and vented if applicable NA All implantable devices assessed, documented and approved NA Intravenous access site secured and place Valuables secured Linens and cotton and cotton/polyester blend (less than 51% polyester) Personal oil-based products / skin lotions / body lotions removed NA Wigs or hairpieces removed NA Smoking or tobacco materials removed Books / newspapers / magazines / loose paper removed Cologne, aftershave, perfume and deodorant removed Jewelry removed (may wrap wedding band) NA Make-up removed NA Hair care products removed Battery operated devices (external) removed Heating patches and chemical warmers removed NA Titanium eyewear removed NA Nail polish cured greater than 10 hours NA Casting material cured greater than 10 hours NA Hearing aids removed Loose dentures or partials removed NA Prosthetics have been removed Patient demonstrates correct use of air break device (if applicable) Patient concerns have been addressed Patient grounding bracelet on and cord attached to chamber Specifics for Inpatients (complete in addition  to above) Medication sheet sent with patient Intravenous medications needed or due during therapy sent with patient [name] [name] ([id number]) Drainage tubes (e.g. nasogastric tube or chest tube secured and vented) Endotracheal or Tracheotomy tube secured Cuff deflated of air and inflated with saline Airway suctioned Electronic Signature(s) Signed: 10/16/2015 3:41:47 PM By: Judene Companion MD  Entered By: Judene Companion on 10/16/2015 09:33:30

## 2015-10-17 NOTE — Progress Notes (Signed)
[name], [name] ([id number]) Visit Report for 10/16/2015 Arrival Information Details Patient Name: [name], [name] Date of Service: 10/16/2015 8:00 AM Medical Record Number: [id number] Patient Account Number: [id number] Date of Birth/Sex: 1955/11/13 (60 y.o. Male) Treating RN: Primary Care Physician: Annye Asa Other Clinician: Jacqulyn Bath Referring Physician: Annye Asa Treating Physician/Extender: Benjaman Pott in Treatment: 7 Visit Information History Since Last Visit Added or deleted any medications: No Patient Arrived: Ambulatory Any new allergies or adverse reactions: No Arrival Time: 07:45 Had a fall or experienced change in No Accompanied By: self activities of daily living that may affect Transfer Assistance: None risk of falls: Patient Identification Verified: Yes Signs or symptoms of abuse/neglect since last No Secondary Verification Process Yes visito Completed: Hospitalized since last visit: No Patient Requires Transmission-Based No Pain Present Now: No Precautions: Patient Has Alerts: No Electronic Signature(s) Signed: 10/16/2015 3:04:29 PM By: Lorine Bears RCP, RRT, CHT Entered By: Lorine Bears on 10/16/2015 08:05:45 [place], [name] ([id number]) -------------------------------------------------------------------------------- Encounter Discharge Information Details Patient Name: [name] Date of Service: 10/16/2015 8:00 AM Medical Record Number: [id number] Patient Account Number: [id number] Date of Birth/Sex: 12-22-1955 (60 y.o. Male) Treating RN: Primary Care Physician: Annye Asa Other Clinician: Jacqulyn Bath Referring Physician: Annye Asa Treating Physician/Extender: Benjaman Pott in Treatment: 7 Encounter Discharge Information Items Discharge Pain Level: 0 Discharge Condition: Stable Ambulatory Status: Ambulatory Discharge Destination:  Home Private Transportation: Auto Accompanied By: self Schedule Follow-up Appointment: No Medication Reconciliation completed and No provided to Patient/Care [name]: Clinical Summary of Care: Notes Patient has an HBO treatment scheduled on 10/17/15 at 08:00 am. Electronic Signature(s) Signed: 10/16/2015 3:04:29 PM By: Lorine Bears RCP, RRT, CHT Entered By: Lorine Bears on 10/16/2015 10:24:12 [name], [name] ([id number]) -------------------------------------------------------------------------------- Patient/Caregiver Education Details Patient Name: [name] Date of Service: 10/16/2015 8:00 AM Medical Record Number: [id number] Patient Account Number: [id number] Date of Birth/Gender: October 04, 1955 (60 y.o. Male) Treating RN: Primary Care Physician: Annye Asa Other Clinician: Jacqulyn Bath Referring Physician: Annye Asa Treating Physician/Extender: Benjaman Pott in Treatment: 7 Education Assessment Education Provided To: Patient Education Topics Provided Electronic Signature(s) Signed: 10/16/2015 3:41:47 PM By: Judene Companion MD Entered By: Judene Companion on 10/16/2015 09:34:44 [name], [name] ([id number]) -------------------------------------------------------------------------------- [place] Details Patient Name: [name] Date of Service: 10/16/2015 8:00 AM Medical Record Number: [id number] Patient Account Number: [id number] Date of Birth/Sex: 1955/04/18 (60 y.o. Male) Treating RN: Primary Care Physician: Annye Asa Other Clinician: Jacqulyn Bath Referring Physician: Annye Asa Treating Physician/Extender: Benjaman Pott in Treatment: 7 Vital Signs Time Taken: 07:50 Temperature (F): 98.1 Height (in): 65 Pulse (bpm): 72 Weight (lbs): 162 Respiratory Rate (breaths/min): 16 Body Mass Index (BMI): 27 Blood Pressure (mmHg): 132/72 Reference Range: 80 - 120 mg / dl Electronic  Signature(s) Signed: 10/16/2015 3:04:29 PM By: Lorine Bears RCP, RRT, CHT Entered By: Becky Sax, Amado Nash on 10/16/2015 08:06:12

## 2015-10-17 NOTE — Progress Notes (Signed)
ALPHONS, ROZON ([id number]) Visit Report for 10/16/2015 Physician Orders Details Patient Name: [name], [name] Date of Service: 10/16/2015 8:00 AM Medical Record Number: [id number] Patient Account Number: [id number] Date of Birth/Sex: 11/18/1955 (60 y.o. 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 Male) Treating RN: Primary Care Physician: Annye Asa Other Clinician: Jacqulyn Bath Referring Physician: Annye Asa Treating Physician/Extender: Benjaman Pott in Treatment: 7 Active Problems ICD-10 Encounter Code Description Active Date Diagnosis L59.8 Other specified disorders of the skin and subcutaneous 08/26/2015 Yes tissue related to radiation N30.41 Irradiation cystitis with hematuria 08/26/2015 Yes R33.8 Other retention of urine 08/26/2015 Yes Z85.46 Personal history of malignant neoplasm of prostate 08/26/2015 Yes Inactive Problems Resolved Problems Electronic Signature(s) Signed: 10/16/2015 3:41:47 PM By: Judene Companion MD Entered By: Judene Companion on 10/16/2015 09:34:25 [name], [name]  ([id number]) -------------------------------------------------------------------------------- [place] Details Patient Name: [name] Date of Service: 10/16/2015 Medical Record Number: [id number] Patient Account Number: [id number] Date of Birth/Sex: [birth year] (60 y.o. 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 R33.8 Other retention of urine Z85.46 Personal history of malignant neoplasm of prostate Facility Procedures CPT4 Code: [id number] Description: (Facility Use Only) HBOT, full body chamber, 57min Modifier: Quantity: 4 Physician Procedures CPT4: Description Modifier Quantity Code [id number] - WC PHYS HYPERBARIC OXYGEN THERAPY 1 ICD-10 Description Diagnosis 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

## 2015-10-18 NOTE — Progress Notes (Signed)
HELDER, BASCOM ([id number]) Visit Report for 10/17/2015 HBO Details Patient Name: QUINTARIOUS, LUTES Date of Service: 10/17/2015 8:00 AM Medical Record Number: [id number] Patient Account Number: [id number] Date of Birth/Sex: Sep 10, 1955 (60 y.o. 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 HBO Treatment Details Treatment Number: 33 Patient Type: Outpatient Chamber Type: Monoplace Chamber Serial #: [id number] Treatment Protocol: 2.0 ATA with 90 minutes oxygen, and no air breaks Treatment Details Compression Rate Down: 1.5 psi / minute De-Compression Rate Up: 1.5 psi / minute Air breaks and breathing Compress Tx Pressure periods Decompress Decompress Begins Reached (leave unused spaces Begins Ends blank) Chamber Pressure (ATA) 1 2 - - - - - - 2 1 Clock Time (24 hr) 08:04 08:14 - - - - - - 09:44 09:54 Treatment Length: 110 (minutes) Treatment Segments: 4 Capillary Blood Glucose Pre Capillary Blood Glucose (mg/dl): Post Capillary Blood Glucose (mg/dl): Vital Signs Capillary Blood Glucose Reference Range: 80 - 120 mg / dl HBO Diabetic Blood Glucose Intervention Range: <131 mg/dl or >249 mg/dl Time Vitals Blood Respiratory Capillary Blood Glucose Pulse Action Type: Pulse: Temperature: Taken: Pressure: Rate: Glucose (mg/dl): Meter #: Oximetry (%) Taken: Pre 07:48 134/72 72 16 98.4 Post 09:59 148/82 72 16 98.6 Treatment Response Treatment Completion Status: Treatment Completed without Adverse Event Dickerson, Ferris ([id number]) HBO Attestation I certify that I supervised this HBO treatment in accordance with  Medicare guidelines. A trained Yes emergency response team is readily available per hospital policies and procedures. Continue HBOT as ordered. Yes Electronic Signature(s) Signed: 10/17/2015 4:46:48 PM By: Londell Moh FNP Previous Signature: 10/17/2015 2:16:25 PM Version By: Lorine Bears RCP, RRT, CHT Entered By: Londell Moh on 10/17/2015 16:46:48 [place]ke, Albee ([id number]) -------------------------------------------------------------------------------- HBO Safety Checklist Details Patient Name: [name] Date of Service: 10/17/2015 8:00 AM Medical Record Number: [id number] Patient Account Number: [id number] Date of Birth/Sex: 29-Dec-1955 (60 y.o. Male) Treating RN: Primary Care Physician: Annye Asa Other Clinician: Jacqulyn Bath Referring Physician: Annye Asa Treating Physician/Extender: Loistine Chance in Treatment: 7 HBO Safety Checklist Items Safety Checklist Consent Form Signed Patient voided / foley secured and emptied When did you last eato 10/16/15 pm Last dose of injectable or oral agent n/a NA Ostomy pouch emptied and vented if applicable NA All implantable devices assessed, documented and approved NA Intravenous access site secured and place Valuables secured Linens and cotton and cotton/polyester blend (less than 51% polyester) Personal oil-based products / skin lotions / body lotions removed NA Wigs or hairpieces removed NA Smoking or tobacco materials removed Books / newspapers / magazines / loose paper removed Cologne, aftershave, perfume and deodorant removed Jewelry removed (may wrap wedding band) NA Make-up removed NA Hair care products removed Battery operated devices (external) removed Heating patches and chemical warmers removed NA Titanium eyewear removed NA Nail polish cured greater than 10 hours NA Casting material cured greater than 10 hours NA Hearing aids removed Loose dentures or  partials removed NA Prosthetics have been removed Patient demonstrates correct use of air break device (if applicable) Patient concerns have been addressed Patient grounding bracelet on and cord attached to chamber Specifics for Inpatients (complete in addition to above) Medication sheet sent with patient Intravenous medications needed or due during therapy sent with patient [employer], [employer] ([id number]) Drainage  tubes (e.g. nasogastric tube or chest tube secured and vented) Endotracheal or Tracheotomy tube secured Cuff deflated of air and inflated with saline Airway suctioned Electronic Signature(s) Signed: 10/17/2015 2:16:25 PM By: Lorine Bears RCP, RRT, CHT Entered By: Lorine Bears on 10/17/2015 08:15:44

## 2015-10-18 NOTE — Progress Notes (Signed)
TOBEY, CAPELLA ([id number]) Visit Report for 10/17/2015 Arrival Information Details Patient Name: ALBAN, MINCHIN Date of Service: 10/17/2015 8:00 AM Medical Record Number: [id number] Patient Account Number: [id number] Date of Birth/Sex: 1955-12-17 (60 y.o. Male) Treating RN: Primary Care Physician: Annye Asa Other Clinician: Jacqulyn Bath Referring Physician: Annye Asa Treating Physician/Extender: Loistine Chance in Treatment: 7 Visit Information History Since Last Visit Added or deleted any medications: No Patient Arrived: Ambulatory Any new allergies or adverse reactions: No Arrival Time: 07:48 Had a fall or experienced change in No Accompanied By: self activities of daily living that may affect Transfer Assistance: None risk of falls: Patient Identification Verified: Yes Signs or symptoms of abuse/neglect since last No Secondary Verification Process Yes visito Completed: Hospitalized since last visit: No Patient Requires Transmission-Based No Pain Present Now: No Precautions: Patient Has Alerts: No Electronic Signature(s) Signed: 10/17/2015 2:16:25 PM By: Lorine Bears RCP, RRT, CHT Entered By: Lorine Bears on 10/17/2015 08:14:19 Langone, Mack ([id number]) -------------------------------------------------------------------------------- Encounter Discharge Information Details Patient Name: [name] Date of Service: 10/17/2015 8:00 AM Medical Record Number: [id number] Patient Account Number: [id number] Date of Birth/Sex: 1955/07/28 (59 y.o. Male) Treating RN: Primary Care Physician: Annye Asa Other Clinician: Jacqulyn Bath Referring Physician: Annye Asa Treating Physician/Extender: Loistine Chance in Treatment: 7 Encounter Discharge Information Items Discharge Pain Level: 0 Discharge Condition: Stable Ambulatory Status: Ambulatory Discharge Destination:  Home Private Transportation: Auto Accompanied By: self Schedule Follow-up Appointment: No Medication Reconciliation completed and No provided to Patient/Care Ronn Smolinsky: Clinical Summary of Care: Notes Patient has an HBO treatment scheduled on 10/20/15 at 08:00 am. Electronic Signature(s) Signed: 10/17/2015 2:16:25 PM By: Lorine Bears RCP, RRT, CHT Entered By: Lorine Bears on 10/17/2015 10:26:41 [place], [facility] ([id number]) -------------------------------------------------------------------------------- Vitals Details Patient Name: [name] Date of Service: 10/17/2015 8:00 AM Medical Record Number: [id number] Patient Account Number: [id number] Date of Birth/Sex: Sep 15, 1955 (60 y.o. Male) Treating RN: Primary Care Physician: Annye Asa Other Clinician: Jacqulyn Bath Referring Physician: Annye Asa Treating Physician/Extender: Loistine Chance in Treatment: 7 Vital Signs Time Taken: 07:48 Temperature (F): 98.4 Height (in): 65 Pulse (bpm): 72 Weight (lbs): 162 Respiratory Rate (breaths/min): 16 Body Mass Index (BMI): 27 Blood Pressure (mmHg): 134/72 Reference Range: 80 - 120 mg / dl Electronic Signature(s) Signed: 10/17/2015 2:16:25 PM By: Lorine Bears RCP, RRT, CHT Entered By: Lorine Bears on 10/17/2015 08:14:43

## 2015-10-20 ENCOUNTER — Encounter (HOSPITAL_BASED_OUTPATIENT_CLINIC_OR_DEPARTMENT_OTHER): Payer: Federal, State, Local not specified - PPO | Admitting: General Surgery

## 2015-10-20 DIAGNOSIS — Z8546 Personal history of malignant neoplasm of prostate: Secondary | ICD-10-CM | POA: Diagnosis not present

## 2015-10-20 DIAGNOSIS — R338 Other retention of urine: Secondary | ICD-10-CM | POA: Diagnosis not present

## 2015-10-20 DIAGNOSIS — N3041 Irradiation cystitis with hematuria: Secondary | ICD-10-CM

## 2015-10-20 DIAGNOSIS — L598 Other specified disorders of the skin and subcutaneous tissue related to radiation: Secondary | ICD-10-CM | POA: Diagnosis not present

## 2015-10-20 NOTE — Progress Notes (Addendum)
[name], [name] ([id number]) Visit Report for 10/20/2015 HBO Details Patient Name: [name], [name] Date of Service: 10/20/2015 8:00 AM Medical Record Number: [id number] Patient Account Number: [id number] Date of Birth/Sex: 1955-06-10 (61 y.o. 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 HBO Treatment Details Treatment Number: 34 Patient Type: Outpatient Chamber Type: Monoplace Chamber Serial #: [id number] Treatment Protocol: 2.0 ATA with 90 minutes oxygen, and no air breaks Treatment Details Compression Rate Down: 1.5 psi / minute De-Compression Rate Up: 1.5 psi / minute Air breaks and breathing Compress Tx Pressure periods Decompress Decompress Begins Reached (leave unused spaces Begins Ends blank) Chamber Pressure (ATA) 1 2 - - - - - - 2 1 Clock Time (24 hr) 08:04 08:14 - - - - - - 09:45 09:55 Treatment Length: 111 (minutes) Treatment Segments: 4 Capillary Blood Glucose Pre Capillary Blood Glucose (mg/dl): Post Capillary Blood Glucose (mg/dl): Vital Signs Capillary Blood Glucose Reference Range: 80 - 120 mg / dl HBO Diabetic Blood Glucose Intervention Range: <131 mg/dl or >249 mg/dl Time Vitals Blood Respiratory Capillary Blood Glucose Pulse Action Type: Pulse: Temperature: Taken: Pressure: Rate: Glucose (mg/dl): Meter #: Oximetry (%) Taken: Pre 07:48 142/80 78 16 98.5 Post 10:00 148/80 72 16 98.3 Treatment Response Treatment Completion Status: Treatment Completed without Adverse Event [name], [name] ([id number]) Electronic Signature(s) Signed: 10/20/2015 12:25:44 PM By: [name] RCP,  RRT, CHT Signed: 10/20/2015 3:40:17 PM By: [name] Companion MD Previous Signature: 10/20/2015 9:10:49 AM Version By: [name] Companion MD Entered By: [name] on 10/20/2015 10:31:31 [name], [name] ([id number]) -------------------------------------------------------------------------------- HBO Safety Checklist Details Patient Name: [name] Date of Service: 10/20/2015 8:00 AM Medical Record Number: [id number] Patient Account Number: [id number] Date of Birth/Sex: 09-23-1955 (60 y.o. Male) Treating RN: Primary Care Physician: [name] Other Clinician: Jacqulyn [name] Referring Physician: Annye [name] Treating Physician/Extender: [name] in Treatment: 7 HBO Safety Checklist Items Safety Checklist Consent Form Signed Patient voided / foley secured and emptied When did you last eato 10/19/15 pm Last dose of injectable or oral agent n/a NA Ostomy pouch emptied and vented if applicable NA All implantable devices assessed, documented and approved NA Intravenous access site secured and place Valuables secured Linens and cotton and cotton/polyester blend (less than 51% polyester) Personal oil-based products / skin lotions / body lotions removed NA Wigs or hairpieces removed NA Smoking or tobacco materials removed Books / newspapers / magazines / loose paper removed Cologne, aftershave, perfume and deodorant removed Jewelry removed (may wrap wedding band) NA Make-up removed Hair care products removed Battery operated devices (external) removed Heating patches and chemical warmers removed NA Titanium eyewear removed NA Nail polish cured greater than 10 hours NA Casting material cured greater than 10 hours NA Hearing aids removed Loose dentures or partials removed NA Prosthetics have been removed Patient demonstrates correct use of air break device (if applicable) Patient concerns have been addressed Patient grounding bracelet on and cord  attached to chamber Specifics for Inpatients (complete in addition to above) Medication sheet sent with patient Intravenous medications needed or due during therapy sent with patient [name], [name] ([id number]) Drainage tubes (e.g. nasogastric tube or chest tube secured and vented) Endotracheal or Tracheotomy tube secured Cuff deflated of air and inflated with saline Airway suctioned Electronic  Signature(s) Signed: 10/20/2015 9:10:26 AM By: [name] Companion MD Entered By: [name] on 10/20/2015 09:10:26

## 2015-10-20 NOTE — Progress Notes (Signed)
ABDURRAHIM, GOZMAN ([id number]) Visit Report for 10/20/2015 Arrival Information Details Patient Name: [name], [name] Date of Service: 10/20/2015 8:00 AM Medical Record Number: [id number] Patient Account Number: [id number] Date of Birth/Sex: [birth year] (60 y.o. Male) Treating RN: Primary Care Physician: Annye Asa Other Clinician: Jacqulyn Bath Referring Physician: Annye Asa Treating Physician/Extender: Benjaman Pott in Treatment: 7 Visit Information History Since Last Visit Added or deleted any medications: No Patient Arrived: Ambulatory Any new allergies or adverse reactions: No Arrival Time: 07:48 Had a fall or experienced change in No Accompanied By: self activities of daily living that may affect Transfer Assistance: None risk of falls: Patient Identification Verified: Yes Signs or symptoms of abuse/neglect since last No Secondary Verification Process Yes visito Completed: Hospitalized since last visit: No Patient Requires Transmission-Based No Pain Present Now: No Precautions: Patient Has Alerts: No Electronic Signature(s) Signed: 10/20/2015 12:25:44 PM By: Lorine Bears RCP, RRT, CHT Entered By: Lorine Bears on 10/20/2015 08:31:43 [name], [name] ([id number]) -------------------------------------------------------------------------------- Encounter Discharge Information Details Patient Name: [name] Date of Service: 10/20/2015 8:00 AM Medical Record Number: [id number] Patient Account Number: [id number] Date of Birth/Sex: April 03, 1955 (60 y.o. Male) Treating RN: Primary Care Physician: Annye Asa Other Clinician: Jacqulyn Bath Referring Physician: Annye Asa Treating Physician/Extender: Benjaman Pott in Treatment: 7 Encounter Discharge Information Items Discharge Pain Level: 0 Discharge Condition: Stable Ambulatory Status: Ambulatory Other (Note Discharge  Destination: Required) Transportation: Private Auto Accompanied By: self Schedule Follow-up Appointment: No Medication Reconciliation completed and provided to Patient/Care No Deitrich Steve: Clinical Summary of Care: Notes Patient has an HBO treatment scheduled on 10/21/15 at 08:00 am. Electronic Signature(s) Signed: 10/20/2015 12:25:44 PM By: Lorine Bears RCP, RRT, CHT Previous Signature: 10/20/2015 9:11:55 AM Version By: Judene Companion MD Entered By: Lorine Bears on 10/20/2015 10:34:00 [name] ([id number]) -------------------------------------------------------------------------------- Patient/Caregiver Education Details Patient Name: [name] Date of Service: 10/20/2015 8:00 AM Medical Record Number: [id number] Patient Account Number: [id number] Date of Birth/Gender: [birth year] (60 y.o. Male) Treating RN: Primary Care Physician: Annye Asa Other Clinician: Jacqulyn Bath Referring Physician: Annye Asa Treating Physician/Extender: Benjaman Pott in Treatment: 7 Education Assessment Education Provided To: Patient Education Topics Provided Electronic Signature(s) Signed: 10/20/2015 3:40:17 PM By: Judene Companion MD Entered By: Judene Companion on 10/20/2015 09:11:47 [name], [name] ([id number]) -------------------------------------------------------------------------------- [place] Details Patient Name: [name] Date of Service: 10/20/2015 8:00 AM Medical Record Number: [id number] Patient Account Number: [id number] Date of Birth/Sex: 05/27/1955 (60 y.o. Male) Treating RN: Primary Care Physician: Annye Asa Other Clinician: Jacqulyn Bath Referring Physician: Annye Asa Treating Physician/Extender: Benjaman Pott in Treatment: 7 Vital Signs Time Taken: 07:48 Temperature (F): 98.5 Height (in): 65 Pulse (bpm): 78 Weight (lbs): 162 Respiratory Rate (breaths/min): 16 Body Mass Index (BMI):  27 Blood Pressure (mmHg): 142/80 Reference Range: 80 - 120 mg / dl Electronic Signature(s) Signed: 10/20/2015 12:25:44 PM By: Lorine Bears RCP, RRT, CHT Entered By: Becky Sax, Amado Nash on 10/20/2015 08:32:10

## 2015-10-20 NOTE — Progress Notes (Addendum)
Tolerated HBO well 

## 2015-10-20 NOTE — Progress Notes (Signed)
NEILSON, MELDE ([id number]) Visit Report for 10/20/2015 Physician Orders Details Patient Name: [name], [name] Date of Service: 10/20/2015 8:00 AM Medical Record Number: [id number] Patient Account Number: [id number] Date of Birth/Sex: 1955-10-20 (60 y.o. 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 Male) Treating RN: Primary Care Physician: Annye Asa Other Clinician: Jacqulyn Bath Referring Physician: Annye Asa Treating Physician/Extender: Benjaman Pott in Treatment: 7 Active Problems ICD-10 Encounter Code Description Active Date Diagnosis L59.8 Other specified disorders of the skin and subcutaneous 08/26/2015 Yes tissue related to radiation N30.41 Irradiation cystitis with hematuria 08/26/2015 Yes R33.8 Other retention of urine 08/26/2015 Yes Z85.46 Personal history of malignant neoplasm of prostate 08/26/2015 Yes Inactive Problems Resolved Problems Electronic Signature(s) Signed: 10/20/2015 9:11:27 AM By: Judene Companion MD Entered By: Judene Companion on 10/20/2015 09:11:27 Martello, Aaliyah  ([id number]) -------------------------------------------------------------------------------- [place] Details Patient Name: [name] Date of Service: 10/20/2015 Medical Record Number: [id number] Patient Account Number: [id number] Date of Birth/Sex: 01/23/1955 (60 y.o. 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

## 2015-10-21 ENCOUNTER — Encounter: Payer: Federal, State, Local not specified - PPO | Admitting: Internal Medicine

## 2015-10-21 DIAGNOSIS — Z8546 Personal history of malignant neoplasm of prostate: Secondary | ICD-10-CM | POA: Diagnosis not present

## 2015-10-21 DIAGNOSIS — R338 Other retention of urine: Secondary | ICD-10-CM | POA: Diagnosis not present

## 2015-10-21 DIAGNOSIS — L598 Other specified disorders of the skin and subcutaneous tissue related to radiation: Secondary | ICD-10-CM | POA: Diagnosis not present

## 2015-10-21 DIAGNOSIS — N3041 Irradiation cystitis with hematuria: Secondary | ICD-10-CM | POA: Diagnosis not present

## 2015-10-22 ENCOUNTER — Encounter: Payer: Federal, State, Local not specified - PPO | Admitting: Internal Medicine

## 2015-10-22 DIAGNOSIS — L598 Other specified disorders of the skin and subcutaneous tissue related to radiation: Secondary | ICD-10-CM | POA: Diagnosis not present

## 2015-10-22 DIAGNOSIS — R338 Other retention of urine: Secondary | ICD-10-CM | POA: Diagnosis not present

## 2015-10-22 DIAGNOSIS — N3041 Irradiation cystitis with hematuria: Secondary | ICD-10-CM | POA: Diagnosis not present

## 2015-10-22 DIAGNOSIS — Z8546 Personal history of malignant neoplasm of prostate: Secondary | ICD-10-CM | POA: Diagnosis not present

## 2015-10-22 NOTE — Progress Notes (Signed)
[name], [name] ([id number]) Visit Report for 10/22/2015 Arrival Information Details Patient Name: [name], [name] Date of Service: 10/22/2015 8:30 AM Medical Record Patient Account Number: [id number] Number: Treating RN: [birth year] (60 y.o. Other Clinician: Jacqulyn Bath Date of Birth/Sex: Male) Treating Dellia Nims [place] Primary Care Physician: Annye Asa Physician/Extender: G Referring Physician: Letha Cape in Treatment: 8 Visit Information History Since Last Visit Added or deleted any medications: No Patient Arrived: Ambulatory Any new allergies or adverse reactions: No Arrival Time: 07:45 Had a fall or experienced change in No Accompanied By: self activities of daily living that may affect Transfer Assistance: None risk of falls: Patient Identification Verified: Yes Signs or symptoms of abuse/neglect since last No Secondary Verification Process Yes visito Completed: Hospitalized since last visit: No Patient Requires Transmission-Based No Pain Present Now: No Precautions: Patient Has Alerts: No Electronic Signature(s) Signed: 10/22/2015 12:14:21 PM By: Lorine Bears RCP, RRT, CHT Entered By: Lorine Bears on 10/22/2015 08:10:36 [name], [name] ([id number]) -------------------------------------------------------------------------------- Encounter Discharge Information Details Patient Name: [name] Date of Service: 10/22/2015 8:30 AM Medical Record Patient Account Number: [id number] Number: Treating RN: May 06, 1955 (60 y.o. Other Clinician: Jacqulyn Bath Date of Birth/Sex: Male) Treating Dellia Nims [place] Primary Care Physician: Annye Asa Physician/Extender: G Referring Physician: Letha Cape in Treatment: 8 Encounter Discharge Information Items Discharge Pain Level: 0 Discharge Condition: Stable Ambulatory Status: Ambulatory Discharge Destination:  Home Private Transportation: Auto Accompanied By: self Schedule Follow-up Appointment: No Medication Reconciliation completed and No provided to Patient/Care Analeise Mccleery: Clinical Summary of Care: Notes Patient has an HBO treatment scheduled on 10/23/15 at 08:00 am. Electronic Signature(s) Signed: 10/22/2015 12:14:21 PM By: Lorine Bears RCP, RRT, CHT Entered By: Lorine Bears on 10/22/2015 10:28:30 [name], [name] ([id number]) -------------------------------------------------------------------------------- Vitals Details Patient Name: [name] Date of Service: 10/22/2015 8:30 AM Medical Record Patient Account Number: [id number] Number: Treating RN: 05/04/1955 (60 y.o. Other Clinician: Jacqulyn Bath Date of Birth/Sex: Male) Treating [place]BS[place], [place] Primary Care Physician: Annye Asa Physician/Extender: G Referring Physician: Letha Cape in Treatment: 8 Vital Signs Time Taken: 07:50 Temperature (F): 97.9 Height (in): 65 Pulse (bpm): 72 Weight (lbs): 162 Respiratory Rate (breaths/min): 16 Body Mass Index (BMI): 27 Blood Pressure (mmHg): 138/88 Reference Range: 80 - 120 mg / dl Electronic Signature(s) Signed: 10/22/2015 12:14:21 PM By: Lorine Bears RCP, RRT, CHT Entered By: Becky Sax, [name] on 10/22/2015 08:21:55

## 2015-10-22 NOTE — Progress Notes (Signed)
JAMARA, BOMBARA ([id number]) Visit Report for 10/21/2015 Arrival Information Details Patient Name: RHONIN, LAURIANO Date of Service: 10/21/2015 8:00 AM Medical Record Patient Account Number: [id number] Number: Treating RN: 17-Jun-1955 (60 y.o. Other Clinician: Jacqulyn Bath Date of Birth/Sex: Male) Treating Dellia Nims [place] Primary Care Physician: Annye Asa Physician/Extender: G Referring Physician: Letha Cape in Treatment: 8 Visit Information History Since Last Visit Added or deleted any medications: No Patient Arrived: Ambulatory Any new allergies or adverse reactions: No Arrival Time: 07:45 Had a fall or experienced change in No Accompanied By: self activities of daily living that may affect Transfer Assistance: None risk of falls: Patient Identification Verified: Yes Signs or symptoms of abuse/neglect since last No Secondary Verification Process Yes visito Completed: Hospitalized since last visit: No Patient Requires Transmission-Based No Pain Present Now: No Precautions: Patient Has Alerts: No Electronic Signature(s) Signed: 10/21/2015 4:03:07 PM By: Lorine Bears RCP, RRT, CHT Entered By: Lorine Bears on 10/21/2015 08:11:47 [place], Rodrigo ([id number]) -------------------------------------------------------------------------------- Encounter Discharge Information Details Patient Name: [name] Date of Service: 10/21/2015 8:00 AM Medical Record Patient Account Number: [id number] Number: Treating RN: 09-25-1955 (60 y.o. Other Clinician: Jacqulyn Bath Date of Birth/Sex: Male) Treating Dellia Nims [place] Primary Care Physician: Annye Asa Physician/Extender: G Referring Physician: Letha Cape in Treatment: 8 Encounter Discharge Information Items Discharge Pain Level: 0 Discharge Condition: Stable Ambulatory Status: Ambulatory Discharge Destination:  Home Private Transportation: Auto Accompanied By: self Schedule Follow-up Appointment: No Medication Reconciliation completed and No provided to Patient/Care Tomoya Ringwald: Clinical Summary of Care: Notes Patient has an HBO assessment and an HBO treatment scheduled on 10/22/15 at 08:00 and 08:30 am. Electronic Signature(s) Signed: 10/21/2015 4:03:07 PM By: Lorine Bears RCP, RRT, CHT Entered By: Lorine Bears on 10/21/2015 10:18:45 Armijo, Damon ([id number]) -------------------------------------------------------------------------------- Vitals Details Patient Name: [name] Date of Service: 10/21/2015 8:00 AM Medical Record Patient Account Number: [id number] Number: Treating RN: 01/12/1955 (60 y.o. Other Clinician: Jacqulyn Bath Date of Birth/Sex: Male) Treating ROBSON, [place] Primary Care Physician: Annye Asa Physician/Extender: G Referring Physician: Letha Cape in Treatment: 8 Vital Signs Time Taken: 07:45 Temperature (F): 98.2 Height (in): 65 Pulse (bpm): 78 Weight (lbs): 162 Respiratory Rate (breaths/min): 16 Body Mass Index (BMI): 27 Blood Pressure (mmHg): 132/80 Reference Range: 80 - 120 mg / dl Electronic Signature(s) Signed: 10/21/2015 4:03:07 PM By: Lorine Bears RCP, RRT, CHT Entered By: Becky Sax, Amado Nash on 10/21/2015 08:16:25

## 2015-10-22 NOTE — Progress Notes (Signed)
[name] ([id number]) Visit Report for 10/21/2015 HBO Details Patient Name: [name], [name] Date of Service: 10/21/2015 8:00 AM Medical Record Patient Account Number: [id number] Number: Treating RN: June 16, 1955 (60 y.o. 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 HBO Treatment Details Treatment Number: 35 Patient Type: Outpatient Chamber Type: Monoplace Chamber Serial #: [id number] Treatment Protocol: 2.0 ATA with 90 minutes oxygen, and no air breaks Treatment Details Compression Rate Down: 1.5 psi / minute De-Compression Rate Up: 1.5 psi / minute Air breaks and breathing Compress Tx Pressure periods Decompress Decompress Begins Reached (leave unused spaces Begins Ends blank) Chamber Pressure (ATA) 1 2 - - - - - - 2 1 Clock Time (24 hr) 08:03 08:13 - - - - - - 09:43 09:53 Treatment Length: 110 (minutes) Treatment Segments: 4 Capillary Blood Glucose Pre Capillary Blood Glucose (mg/dl): Post Capillary Blood Glucose (mg/dl): Vital Signs Capillary Blood Glucose Reference Range: 80 - 120 mg / dl HBO Diabetic Blood Glucose Intervention Range: <131 mg/dl or >249 mg/dl Time Vitals Blood Respiratory Capillary Blood Glucose Pulse Action Type: Pulse: Temperature: Taken: Pressure: Rate: Glucose (mg/dl): Meter #: Oximetry (%) Taken: Pre 07:45 132/80 78 16 98.2 Post 09:57 150/84 72 16 98.3 [place], Laker ([id number]) Treatment Response Treatment Completion Status: Treatment Completed without Adverse Event Physician Notes No concerns treatment given HBO Attestation I certify that I  supervised this HBO treatment in accordance with Medicare guidelines. A trained Yes emergency response team is readily available per hospital policies and procedures. Continue HBOT as ordered. Yes Electronic Signature(s) Signed: 10/22/2015 8:01:59 AM By: Linton Ham MD Previous Signature: 10/21/2015 4:03:07 PM Version By: Lorine Bears RCP, RRT, CHT Entered By: Linton Ham on 10/22/2015 07:35:43 Langham, Jimmy ([id number]) -------------------------------------------------------------------------------- HBO Safety Checklist Details Patient Name: [name] Date of Service: 10/21/2015 8:00 AM Medical Record Patient Account Number: [id number] Number: Treating RN: 01/24/1955 (60 y.o. Other Clinician: Jacqulyn Bath Date of Birth/Sex: Male) Treating ROBSON, [place] Primary Care Physician: Annye Asa Physician/Extender: G Referring Physician: Letha Cape in Treatment: 8 HBO Safety Checklist Items Safety Checklist Consent Form Signed Patient voided / foley secured and emptied When did you last eato 10/20/15 pm Last dose of injectable or oral agent n/a NA Ostomy pouch emptied and vented if applicable NA All implantable devices assessed, documented and approved NA Intravenous access site secured and place Valuables secured Linens and cotton and cotton/polyester blend (less than 51% polyester) Personal oil-based products / skin lotions / body lotions removed NA Wigs or hairpieces removed NA Smoking or tobacco materials removed Books / newspapers / magazines / loose paper removed Cologne, aftershave, perfume and deodorant removed Jewelry removed (may wrap wedding band) NA Make-up removed NA Hair care products removed Battery operated devices (external) removed Heating patches and chemical warmers removed NA Titanium eyewear removed NA Nail polish cured greater than 10 hours NA Casting material cured greater than 10  hours NA Hearing aids removed Loose dentures or partials removed NA Prosthetics have been removed Patient demonstrates correct use of air break device (if applicable) Patient concerns have been addressed Patient grounding bracelet on and cord attached to chamber Specifics for Inpatients (complete in addition to above) Medication sheet sent with patient [name] ([id number]) Intravenous medications needed  or due during therapy sent with patient Drainage tubes (e.g. nasogastric tube or chest tube secured and vented) Endotracheal or Tracheotomy tube secured Cuff deflated of air and inflated with saline Airway suctioned Electronic Signature(s) Signed: 10/21/2015 4:03:07 PM By: Lorine Bears RCP, RRT, CHT Entered By: Becky Sax, Amado Nash on 10/21/2015 08:18:29

## 2015-10-23 ENCOUNTER — Encounter: Payer: Federal, State, Local not specified - PPO | Admitting: Surgery

## 2015-10-23 DIAGNOSIS — L598 Other specified disorders of the skin and subcutaneous tissue related to radiation: Secondary | ICD-10-CM | POA: Diagnosis not present

## 2015-10-23 DIAGNOSIS — R338 Other retention of urine: Secondary | ICD-10-CM | POA: Diagnosis not present

## 2015-10-23 DIAGNOSIS — N3041 Irradiation cystitis with hematuria: Secondary | ICD-10-CM | POA: Diagnosis not present

## 2015-10-23 DIAGNOSIS — Z8546 Personal history of malignant neoplasm of prostate: Secondary | ICD-10-CM | POA: Diagnosis not present

## 2015-10-23 NOTE — Progress Notes (Signed)
[name], [name] ([id number]) Visit Report for 10/22/2015 HBO Details Patient Name: [name], [name] Date of Service: 10/22/2015 8:30 AM Medical Record Patient Account Number: [id number] Number: Treating RN: 11/15/1955 (60 y.o. 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 HBO Treatment Details Treatment Number: 36 Patient Type: Outpatient Chamber Type: Monoplace Chamber Serial #: [id number] Treatment Protocol: 2.0 ATA with 90 minutes oxygen, and no air breaks Treatment Details Compression Rate Down: 1.5 psi / minute De-Compression Rate Up: 1.5 psi / minute Air breaks and breathing Compress Tx Pressure periods Decompress Decompress Begins Reached (leave unused spaces Begins Ends blank) Chamber Pressure (ATA) 1 2 - - - - - - 2 1 Clock Time (24 hr) 08:08 08:18 - - - - - - 09:49 09:59 Treatment Length: 111 (minutes) Treatment Segments: 4 Capillary Blood Glucose Pre Capillary Blood Glucose (mg/dl): Post Capillary Blood Glucose (mg/dl): Vital Signs Capillary Blood Glucose Reference Range: 80 - 120 mg / dl HBO Diabetic Blood Glucose Intervention Range: <131 mg/dl or >249 mg/dl Time Vitals Blood Respiratory Capillary Blood Glucose Pulse Action Type: Pulse: Temperature: Taken: Pressure: Rate: Glucose (mg/dl): Meter #: Oximetry (%) Taken: Pre 07:50 138/88 72 16 97.9 Post 10:10 126/82 60 16 97.9 [name], [name] ([id number]) Treatment Response Treatment Completion Status: Treatment Completed without Adverse Event Physician Notes No concerns with treatment given HBO Attestation I certify that I  supervised this HBO treatment in accordance with Medicare guidelines. A trained Yes emergency response team is readily available per hospital policies and procedures. Continue HBOT as ordered. Yes Electronic Signature(s) Signed: 10/22/2015 5:07:12 PM By: Linton Ham MD Previous Signature: 10/22/2015 12:14:21 PM Version By: Lorine Bears RCP, RRT, CHT Entered By: Linton Ham on 10/22/2015 12:46:45 [place], [place] ([id number]) -------------------------------------------------------------------------------- HBO Safety Checklist Details Patient Name: [name] Date of Service: 10/22/2015 8:30 AM Medical Record Patient Account Number: [id number] Number: Treating RN: October 01, 1955 (60 y.o. Other Clinician: Jacqulyn Bath Date of Birth/Sex: Male) Treating ROBSON, [place] Primary Care Physician: Annye Asa Physician/Extender: G Referring Physician: Letha Cape in Treatment: 8 HBO Safety Checklist Items Safety Checklist Consent Form Signed Patient voided / foley secured and emptied When did you last eato 10/21/15 pm Last dose of injectable or oral agent n/a NA Ostomy pouch emptied and vented if applicable NA All implantable devices assessed, documented and approved NA Intravenous access site secured and place Valuables secured Linens and cotton and cotton/polyester blend (less than 51% polyester) Personal oil-based products / skin lotions / body lotions removed NA Wigs or hairpieces removed NA Smoking or tobacco materials removed Books / newspapers / magazines / loose paper removed Cologne, aftershave, perfume and deodorant removed Jewelry removed (may wrap wedding band) NA Make-up removed NA Hair care products removed Battery operated devices (external) removed Heating patches and chemical warmers removed NA Titanium eyewear removed NA Nail polish cured greater than 10 hours NA Casting material cured greater than 10  hours NA Hearing aids removed Loose dentures or partials removed NA Prosthetics have been removed Patient demonstrates correct use of air break device (if applicable) Patient concerns have been addressed Patient grounding bracelet on and cord attached to chamber Specifics for Inpatients (complete in addition to above) Medication sheet sent with patient [name], [name] ([id number]) Intravenous medications  needed or due during therapy sent with patient Drainage tubes (e.g. nasogastric tube or chest tube secured and vented) Endotracheal or Tracheotomy tube secured Cuff deflated of air and inflated with saline Airway suctioned Electronic Signature(s) Signed: 10/22/2015 12:14:21 PM By: Lorine Bears RCP, RRT, CHT Entered By: Becky Sax, Amado Nash on 10/22/2015 08:22:46

## 2015-10-24 ENCOUNTER — Encounter: Payer: Federal, State, Local not specified - PPO | Admitting: Surgery

## 2015-10-24 DIAGNOSIS — Z8546 Personal history of malignant neoplasm of prostate: Secondary | ICD-10-CM | POA: Diagnosis not present

## 2015-10-24 DIAGNOSIS — N3041 Irradiation cystitis with hematuria: Secondary | ICD-10-CM | POA: Diagnosis not present

## 2015-10-24 DIAGNOSIS — R338 Other retention of urine: Secondary | ICD-10-CM | POA: Diagnosis not present

## 2015-10-24 DIAGNOSIS — L598 Other specified disorders of the skin and subcutaneous tissue related to radiation: Secondary | ICD-10-CM | POA: Diagnosis not present

## 2015-10-24 NOTE — Progress Notes (Signed)
MARK, GAVIA ([id number]) Visit Report for 10/23/2015 Arrival Information Details Patient Name: AASHRITH, WILHELM Date of Service: 10/23/2015 8:00 AM Medical Record Number: [id number] Patient Account Number: [id number] Date of Birth/Sex: 1955-04-24 (60 y.o. Male) Treating RN: Primary Care Physician: Annye Asa Other Clinician: Jacqulyn Bath Referring Physician: Annye Asa Treating Physician/Extender: Frann Rider in Treatment: 8 Visit Information History Since Last Visit Added or deleted any medications: No Patient Arrived: Ambulatory Any new allergies or adverse reactions: No Arrival Time: 07:50 Had a fall or experienced change in No Accompanied By: self activities of daily living that may affect Transfer Assistance: None risk of falls: Patient Identification Verified: Yes Signs or symptoms of abuse/neglect since last No Secondary Verification Process Yes visito Completed: Hospitalized since last visit: No Patient Requires Transmission-Based No Pain Present Now: No Precautions: Patient Has Alerts: No Electronic Signature(s) Signed: 10/23/2015 5:18:51 PM By: Lorine Bears RCP, RRT, CHT Entered By: Lorine Bears on 10/23/2015 08:09:10 [place], Abijah ([id number]) -------------------------------------------------------------------------------- Encounter Discharge Information Details Patient Name: [name] Date of Service: 10/23/2015 8:00 AM Medical Record Number: [id number] Patient Account Number: [id number] Date of Birth/Sex: 01/21/1955 (60 y.o. Male) Treating RN: Primary Care Physician: Annye Asa Other Clinician: Jacqulyn Bath Referring Physician: Annye Asa Treating Physician/Extender: Frann Rider in Treatment: 8 Encounter Discharge Information Items Discharge Pain Level: 0 Discharge Condition: Stable Ambulatory Status: Ambulatory Discharge Destination:  Home Private Transportation: Auto Accompanied By: self Schedule Follow-up Appointment: No Medication Reconciliation completed and No provided to Patient/Care Syd Manges: Clinical Summary of Care: Notes Patient has an HBO treatment scheduled on 10/24/15 at 08:00 am. Electronic Signature(s) Signed: 10/23/2015 5:18:51 PM By: Lorine Bears RCP, RRT, CHT Entered By: Lorine Bears on 10/23/2015 10:57:49 Maue, Marky ([id number]) -------------------------------------------------------------------------------- Vitals Details Patient Name: [name] Date of Service: 10/23/2015 8:00 AM Medical Record Number: [id number] Patient Account Number: [id number] Date of Birth/Sex: Apr 10, 1955 (60 y.o. Male) Treating RN: Primary Care Physician: Annye Asa Other Clinician: Jacqulyn Bath Referring Physician: Annye Asa Treating Physician/Extender: Frann Rider in Treatment: 8 Vital Signs Time Taken: 07:50 Temperature (F): 98.5 Height (in): 65 Pulse (bpm): 72 Weight (lbs): 162 Respiratory Rate (breaths/min): 16 Body Mass Index (BMI): 27 Blood Pressure (mmHg): 132/82 Reference Range: 80 - 120 mg / dl Electronic Signature(s) Signed: 10/23/2015 5:18:51 PM By: Lorine Bears RCP, RRT, CHT Entered By: Becky Sax, Amado Nash on 10/23/2015 08:09:41

## 2015-10-24 NOTE — Progress Notes (Signed)
FINDLEY, EWTON ([id number]) Visit Report for 10/23/2015 HBO Details Patient Name: [name], [name] Date of Service: 10/23/2015 8:00 AM Medical Record Number: [id number] Patient Account Number: [id number] Date of Birth/Sex: [birth year] (60 y.o. 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 HBO Treatment Details Treatment Number: 37 Patient Type: Outpatient Chamber Type: Monoplace Chamber Serial #: [id number] Treatment Protocol: 2.0 ATA with 90 minutes oxygen, and no air breaks Treatment Details Compression Rate Down: 1.5 psi / minute De-Compression Rate Up: 1.5 psi / minute Air breaks and breathing Compress Tx Pressure periods Decompress Decompress Begins Reached (leave unused spaces Begins Ends blank) Chamber Pressure (ATA) 1 2 - - - - - - 2 1 Clock Time (24 hr) 08:04 08:14 - - - - - - 09:45 09:55 Treatment Length: 111 (minutes) Treatment Segments: 4 Capillary Blood Glucose Pre Capillary Blood Glucose (mg/dl): Post Capillary Blood Glucose (mg/dl): Vital Signs Capillary Blood Glucose Reference Range: 80 - 120 mg / dl HBO Diabetic Blood Glucose Intervention Range: <131 mg/dl or >249 mg/dl Time Vitals Blood Respiratory Capillary Blood Glucose Pulse Action Type: Pulse: Temperature: Taken: Pressure: Rate: Glucose (mg/dl): Meter #: Oximetry (%) Taken: Pre 07:50 132/82 72 16 98.5 Post 10:00 160/74 72 16 98.6 Treatment Response Treatment Completion Status: Treatment Completed without Adverse Event [name], [name] ([id number]) Electronic Signature(s) Signed: 10/23/2015 3:58:39 PM By: Christin Fudge MD, FACS Signed:  10/23/2015 5:18:51 PM By: Lorine Bears RCP, RRT, CHT Previous Signature: 10/23/2015 10:42:56 AM Version By: Christin Fudge MD, FACS Entered By: Lorine Bears on 10/23/2015 10:56:57 [name], [name] ([id number]) -------------------------------------------------------------------------------- HBO Safety Checklist Details Patient Name: [name] Date of Service: 10/23/2015 8:00 AM Medical Record Number: [id number] Patient Account Number: [id number] Date of Birth/Sex: 01-20-1955 (60 y.o. Male) Treating RN: Primary Care Physician: Annye Asa Other Clinician: Jacqulyn Bath Referring Physician: Annye Asa Treating Physician/Extender: Frann Rider in Treatment: 8 HBO Safety Checklist Items Safety Checklist Consent Form Signed Patient voided / foley secured and emptied When did you last eato 10/22/15 pm Last dose of injectable or oral agent n/a NA Ostomy pouch emptied and vented if applicable NA All implantable devices assessed, documented and approved NA Intravenous access site secured and place Valuables secured Linens and cotton and cotton/polyester blend (less than 51% polyester) Personal oil-based products / skin lotions / body lotions removed NA Wigs or hairpieces removed NA Smoking or tobacco materials removed Books / newspapers / magazines / loose paper removed Cologne, aftershave, perfume and deodorant removed Jewelry removed (may wrap wedding band) NA Make-up removed NA Hair care products removed Battery operated devices (external) removed Heating patches and chemical warmers removed NA Titanium eyewear removed NA Nail polish cured greater than 10 hours NA Casting material cured greater than 10 hours NA Hearing aids removed Loose dentures or partials removed NA Prosthetics have been removed Patient demonstrates correct use of air break device (if applicable) Patient concerns have been addressed Patient grounding  bracelet on and cord attached to chamber Specifics for Inpatients (complete in addition to above) Medication sheet sent with patient Intravenous medications needed or due during therapy sent with patient [name], [name] ([id number]) Drainage tubes (e.g. nasogastric tube or chest tube secured and vented) Endotracheal or Tracheotomy tube secured Cuff deflated of air and inflated with saline  Airway suctioned Electronic Signature(s) Signed: 10/23/2015 5:18:51 PM By: Lorine Bears RCP, RRT, CHT Entered By: Lorine Bears on 10/23/2015 08:10:40

## 2015-10-25 NOTE — Progress Notes (Signed)
YORIEL, CRISPELL ([id number]) Visit Report for 10/24/2015 HBO Details Patient Name: [name], [name] Date of Service: 10/24/2015 8:00 AM Medical Record Number: [id number] Patient Account Number: [id number] Date of Birth/Sex: [birth year] (60 y.o. 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 HBO Treatment Details Treatment Number: 38 Patient Type: Outpatient Chamber Type: Monoplace Chamber Serial #: [id number] Treatment Protocol: 2.0 ATA with 90 minutes oxygen, and no air breaks Treatment Details Compression Rate Down: 1.5 psi / minute De-Compression Rate Up: 1.5 psi / minute Air breaks and breathing Compress Tx Pressure periods Decompress Decompress Begins Reached (leave unused spaces Begins Ends blank) Chamber Pressure (ATA) 1 2 - - - - - - 2 1 Clock Time (24 hr) 08:01 08:11 - - - - - - 09:41 09:51 Treatment Length: 110 (minutes) Treatment Segments: 4 Capillary Blood Glucose Pre Capillary Blood Glucose (mg/dl): Post Capillary Blood Glucose (mg/dl): Vital Signs Capillary Blood Glucose Reference Range: 80 - 120 mg / dl HBO Diabetic Blood Glucose Intervention Range: <131 mg/dl or >249 mg/dl Time Vitals Blood Respiratory Capillary Blood Glucose Pulse Action Type: Pulse: Temperature: Taken: Pressure: Rate: Glucose (mg/dl): Meter #: Oximetry (%) Taken: Pre 07:50 142/80 72 16 98.4 Post 10:05 140/90 66 16 97.8 Treatment Response Treatment Completion Status: Treatment Completed without Adverse Event [name], [name] ([id number]) HBO Attestation I certify that I supervised this HBO treatment in accordance with Medicare  guidelines. A trained Yes emergency response team is readily available per hospital policies and procedures. Continue HBOT as ordered. Yes Electronic Signature(s) Signed: 10/24/2015 11:48:52 AM By: Christin Fudge MD, FACS Entered By: Christin Fudge on 10/24/2015 11:48:51 [name], [name] ([id number]) -------------------------------------------------------------------------------- HBO Safety Checklist Details Patient Name: [name] Date of Service: 10/24/2015 8:00 AM Medical Record Number: [id number] Patient Account Number: [id number] Date of Birth/Sex: 1955/12/25 (60 y.o. Male) Treating RN: Primary Care Physician: Annye Asa Other Clinician: Jacqulyn Bath Referring Physician: Annye Asa Treating Physician/Extender: Frann Rider in Treatment: 8 HBO Safety Checklist Items Safety Checklist Consent Form Signed Patient voided / foley secured and emptied When did you last eato 10/23/15 pm Last dose of injectable or oral agent n/a NA Ostomy pouch emptied and vented if applicable NA All implantable devices assessed, documented and approved NA Intravenous access site secured and place Valuables secured Linens and cotton and cotton/polyester blend (less than 51% polyester) NA Personal oil-based products / skin lotions / body lotions removed NA Wigs or hairpieces removed NA Smoking or tobacco materials removed Books / newspapers / magazines / loose paper removed Cologne, aftershave, perfume and deodorant removed Jewelry removed (may wrap wedding band) NA Make-up removed Hair care products removed Battery operated devices (external) removed NA Heating patches and chemical warmers removed NA Titanium eyewear removed NA Nail polish cured greater than 10 hours NA Casting material cured greater than 10 hours NA Hearing aids removed Loose dentures or partials removed NA Prosthetics have been removed Patient demonstrates correct use of air break device (if  applicable) Patient concerns have been addressed Patient grounding bracelet on and cord attached to chamber Specifics for Inpatients (complete in addition to above) Medication sheet sent with patient Intravenous medications needed or due during therapy sent with patient [name], [name] ([id number]) Drainage tubes (e.g. nasogastric tube or chest tube secured and vented) Endotracheal  or Tracheotomy tube secured Cuff deflated of air and inflated with saline Airway suctioned Electronic Signature(s) Signed: 10/24/2015 1:28:31 PM By: Lorine Bears RCP, RRT, CHT Entered By: Becky Sax, Amado Nash on 10/24/2015 08:08:54

## 2015-10-25 NOTE — Progress Notes (Signed)
[name] [name] ([id number]) Visit Report for 10/24/2015 Arrival Information Details Patient Name: [name] [name] Date of Service: 10/24/2015 8:00 AM Medical Record Number: [id number] Patient Account Number: [id number] Date of Birth/Sex: 1955/09/14 (60 y.o. Male) Treating RN: Primary Care Physician: Annye Asa Other Clinician: Jacqulyn Bath Referring Physician: Annye Asa Treating Physician/Extender: Frann Rider in Treatment: 8 Visit Information History Since Last Visit Added or deleted any medications: No Patient Arrived: Ambulatory Any new allergies or adverse reactions: No Arrival Time: 07:50 Had a fall or experienced change in No Accompanied By: self activities of daily living that may affect Transfer Assistance: None risk of falls: Patient Identification Verified: Yes Signs or symptoms of abuse/neglect since last No Secondary Verification Process Yes visito Completed: Hospitalized since last visit: No Patient Requires Transmission-Based No Pain Present Now: No Precautions: Patient Has Alerts: No Electronic Signature(s) Signed: 10/24/2015 1:28:31 PM By: Lorine Bears RCP, RRT, CHT Entered By: Lorine Bears on 10/24/2015 08:05:23 [name] [name] ([id number]) -------------------------------------------------------------------------------- Encounter Discharge Information Details Patient Name: [name] [name] Date of Service: 10/24/2015 8:00 AM Medical Record Number: [id number] Patient Account Number: [id number] Date of Birth/Sex: 08-25-1955 (60 y.o. Male) Treating RN: Primary Care Physician: Annye Asa Other Clinician: Jacqulyn Bath Referring Physician: Annye Asa Treating Physician/Extender: Frann Rider in Treatment: 8 Encounter Discharge Information Items Discharge Pain Level: 0 Discharge Condition: Stable Ambulatory Status: Ambulatory Discharge Destination:  Home Private Transportation: Auto Accompanied By: self Schedule Follow-up Appointment: No Medication Reconciliation completed and No provided to Patient/Care Sharalee Witman: Clinical Summary of Care: Notes Patient has an HBO treatment scheduled on 10/27/15 at 08:00 am. Electronic Signature(s) Signed: 10/24/2015 1:28:31 PM By: Lorine Bears RCP, RRT, CHT Entered By: Lorine Bears on 10/24/2015 10:18:43 [name] [name] ([id number]) -------------------------------------------------------------------------------- Vitals Details Patient Name: [name] [name] Date of Service: 10/24/2015 8:00 AM Medical Record Number: [id number] Patient Account Number: [id number] Date of Birth/Sex: 10/27/1955 (60 y.o. Male) Treating RN: Primary Care Physician: Annye Asa Other Clinician: Jacqulyn Bath Referring Physician: Annye Asa Treating Physician/Extender: Frann Rider in Treatment: 8 Vital Signs Time Taken: 07:50 Temperature (F): 98.4 Height (in): 65 Pulse (bpm): 72 Weight (lbs): 162 Respiratory Rate (breaths/min): 16 Body Mass Index (BMI): 27 Blood Pressure (mmHg): 142/80 Reference Range: 80 - 120 mg / dl Electronic Signature(s) Signed: 10/24/2015 1:28:31 PM By: Lorine Bears RCP, RRT, CHT Entered By: Becky Sax, Amado Nash on 10/24/2015 08:07:29

## 2015-10-27 ENCOUNTER — Encounter: Payer: Federal, State, Local not specified - PPO | Admitting: Surgery

## 2015-10-27 DIAGNOSIS — Z8546 Personal history of malignant neoplasm of prostate: Secondary | ICD-10-CM | POA: Diagnosis not present

## 2015-10-27 DIAGNOSIS — L598 Other specified disorders of the skin and subcutaneous tissue related to radiation: Secondary | ICD-10-CM | POA: Diagnosis not present

## 2015-10-27 DIAGNOSIS — R338 Other retention of urine: Secondary | ICD-10-CM | POA: Diagnosis not present

## 2015-10-27 DIAGNOSIS — N3041 Irradiation cystitis with hematuria: Secondary | ICD-10-CM | POA: Diagnosis not present

## 2015-10-27 NOTE — Progress Notes (Signed)
[name] ([id number]) Visit Report for 10/27/2015 HBO Details Patient Name: [name], [name] Date of Service: 10/27/2015 8:00 AM Medical Record Number: [id number] Patient Account Number: [id number] Date of Birth/Sex: Dec 28, 1955 (60 y.o. 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 HBO Treatment Details Treatment Number: 39 Patient Type: Outpatient Chamber Type: Monoplace Chamber Serial #: [id number] Treatment Protocol: 2.0 ATA with 90 minutes oxygen, and no air breaks Treatment Details Compression Rate Down: 1.5 psi / minute De-Compression Rate Up: 1.5 psi / minute Air breaks and breathing Compress Tx Pressure periods Decompress Decompress Begins Reached (leave unused spaces Begins Ends blank) Chamber Pressure (ATA) 1 2 - - - - - - 2 1 Clock Time (24 hr) 08:08 08:18 - - - - - - 09:48 09:58 Treatment Length: 110 (minutes) Treatment Segments: 4 Capillary Blood Glucose Pre Capillary Blood Glucose (mg/dl): Post Capillary Blood Glucose (mg/dl): Vital Signs Capillary Blood Glucose Reference Range: 80 - 120 mg / dl HBO Diabetic Blood Glucose Intervention Range: <131 mg/dl or >249 mg/dl Time Vitals Blood Respiratory Capillary Blood Glucose Pulse Action Type: Pulse: Temperature: Taken: Pressure: Rate: Glucose (mg/dl): Meter #: Oximetry (%) Taken: Pre 07:52 138/76 72 16 98.3 Post 10:00 130/84 72 16 98.3 Treatment Response Treatment Completion Status: Treatment Completed without Adverse Event Lizardi, Cypress ([id number]) HBO Attestation I certify that I supervised this HBO treatment in accordance with Medicare  guidelines. A trained Yes emergency response team is readily available per hospital policies and procedures. Continue HBOT as ordered. Yes Electronic Signature(s) Signed: 10/27/2015 11:56:01 AM By: Christin Fudge MD, FACS Entered By: Christin Fudge on 10/27/2015 11:56:00 Hulon, Dail ([id number]) -------------------------------------------------------------------------------- HBO Safety Checklist Details Patient Name: [name] Date of Service: 10/27/2015 8:00 AM Medical Record Number: [id number] Patient Account Number: [id number] Date of Birth/Sex: Feb 23, 1955 (60 y.o. Male) Treating RN: Primary Care Physician: Annye Asa Other Clinician: Jacqulyn Bath Referring Physician: Annye Asa Treating Physician/Extender: Frann Rider in Treatment: 8 HBO Safety Checklist Items Safety Checklist Consent Form Signed Patient voided / foley secured and emptied When did you last eato 10/26/15 pm Last dose of injectable or oral agent n/a NA Ostomy pouch emptied and vented if applicable NA All implantable devices assessed, documented and approved NA Intravenous access site secured and place Valuables secured Linens and cotton and cotton/polyester blend (less than 51% polyester) Personal oil-based products / skin lotions / body lotions removed NA Wigs or hairpieces removed NA Smoking or tobacco materials removed Books / newspapers / magazines / loose paper removed Cologne, aftershave, perfume and deodorant removed Jewelry removed (may wrap wedding band) NA Make-up removed Hair care products removed Battery operated devices (external) removed Heating patches and chemical warmers removed NA Titanium eyewear removed NA Nail polish cured greater than 10 hours NA Casting material cured greater than 10 hours NA Hearing aids removed Loose dentures or partials removed NA Prosthetics have been removed Patient demonstrates correct use of air break device (if  applicable) Patient concerns have been addressed Patient grounding bracelet on and cord attached to chamber Specifics for Inpatients (complete in addition to above) Medication sheet sent with patient Intravenous medications needed or due during therapy sent with patient Marcelli, Gabor ([id number]) Drainage tubes (e.g. nasogastric tube or chest tube secured and vented) Endotracheal or Tracheotomy  tube secured Cuff deflated of air and inflated with saline Airway suctioned Electronic Signature(s) Signed: 10/27/2015 2:30:06 PM By: Lorine Bears RCP, RRT, CHT Entered By: Becky Sax, Amado Nash on 10/27/2015 09:49:57

## 2015-10-27 NOTE — Progress Notes (Signed)
JERAMEE, CANCILLA ([id number]) Visit Report for 10/27/2015 Arrival Information Details Patient Name: GUSTAVUS, BASALDUA Date of Service: 10/27/2015 8:00 AM Medical Record Number: [id number] Patient Account Number: [id number] Date of Birth/Sex: [birth year] (60 y.o. Male) Treating RN: Primary Care Physician: Annye Asa Other Clinician: Jacqulyn Bath Referring Physician: Annye Asa Treating Physician/Extender: Frann Rider in Treatment: 8 Visit Information History Since Last Visit Added or deleted any medications: No Patient Arrived: Ambulatory Any new allergies or adverse reactions: No Arrival Time: 07:50 Had a fall or experienced change in No Accompanied By: self activities of daily living that may affect Transfer Assistance: None risk of falls: Patient Identification Verified: Yes Signs or symptoms of abuse/neglect since last No Secondary Verification Process Yes visito Completed: Hospitalized since last visit: No Patient Requires Transmission-Based No Pain Present Now: No Precautions: Patient Has Alerts: No Electronic Signature(s) Signed: 10/27/2015 2:30:06 PM By: Lorine Bears RCP, RRT, CHT Entered By: Lorine Bears on 10/27/2015 09:48:43 Atka, Saketh ([id number]) -------------------------------------------------------------------------------- Encounter Discharge Information Details Patient Name: [name] Date of Service: 10/27/2015 8:00 AM Medical Record Number: [id number] Patient Account Number: [id number] Date of Birth/Sex: 01-Mar-1955 (60 y.o. Male) Treating RN: Primary Care Physician: Annye Asa Other Clinician: Jacqulyn Bath Referring Physician: Annye Asa Treating Physician/Extender: Frann Rider in Treatment: 8 Encounter Discharge Information Items Discharge Pain Level: 0 Discharge Condition: Stable Ambulatory Status: Ambulatory Discharge Destination:  Home Private Transportation: Auto Accompanied By: self Schedule Follow-up Appointment: No Medication Reconciliation completed and No provided to Patient/Care Reiana Poteet: Clinical Summary of Care: Notes Patient has an HBO treatment scheduled on 10/28/15 at 08:00 am. Electronic Signature(s) Signed: 10/27/2015 2:30:06 PM By: Lorine Bears RCP, RRT, CHT Entered By: Lorine Bears on 10/27/2015 10:25:50 Bartnick, Gill ([id number]) -------------------------------------------------------------------------------- Vitals Details Patient Name: [name] Date of Service: 10/27/2015 8:00 AM Medical Record Number: [id number] Patient Account Number: [id number] Date of Birth/Sex: 1955/03/19 (60 y.o. Male) Treating RN: Primary Care Physician: Annye Asa Other Clinician: Jacqulyn Bath Referring Physician: Annye Asa Treating Physician/Extender: Frann Rider in Treatment: 8 Vital Signs Time Taken: 07:52 Temperature (F): 98.3 Height (in): 65 Pulse (bpm): 72 Weight (lbs): 162 Respiratory Rate (breaths/min): 16 Body Mass Index (BMI): 27 Blood Pressure (mmHg): 138/76 Reference Range: 80 - 120 mg / dl Electronic Signature(s) Signed: 10/27/2015 2:30:06 PM By: Lorine Bears RCP, RRT, CHT Entered By: Lorine Bears on 10/27/2015 09:49:06

## 2015-10-27 NOTE — Patient Instructions (Signed)
  Your procedure is scheduled on: 11-04-15 Report to Same Day Surgery 2nd floor medical mall To find out your arrival time please call [phone] between 1PM - 3PM on 11-03-15  Remember: Instructions that are not followed completely may result in serious medical risk, up to and including death, or upon the discretion of your surgeon and anesthesiologist your surgery may need to be rescheduled.    _x___ 1. Do not eat food or drink liquids after midnight. No gum chewing or hard candies.     __x__ 2. No Alcohol for 24 hours before or after surgery.   __x__3. No Smoking for 24 prior to surgery.   ____  4. Bring all medications with you on the day of surgery if instructed.    __x__ 5. Notify your doctor if there is any change in your medical condition     (cold, fever, infections).     Do not wear jewelry, make-up, hairpins, clips or nail polish.  Do not wear lotions, powders, or perfumes. You may wear deodorant.  Do not shave 48 hours prior to surgery. Men may shave face and neck.  Do not bring valuables to the hospital.    [facility] is not responsible for any belongings or valuables.               Contacts, dentures or bridgework may not be worn into surgery.  Leave your suitcase in the car. After surgery it may be brought to your room.  For patients admitted to the hospital, discharge time is determined by your treatment team.   Patients discharged the day of surgery will not be allowed to drive home.    Please read over the following fact sheets that you were given:   [facility] Preparing for Surgery and or MRSA Information   _x___ Take these medicines the morning of surgery with A SIP OF WATER:    1. LISINOPRIL  2. BYSTOLIC  3. VERAPAMIL  4. ATORVASTATIN  5.  6.  ____Fleets enema or Magnesium Citrate as directed.   ____ Use CHG Soap or sage wipes as directed on instruction sheet   ____ Use inhalers on the day of surgery and bring to hospital day of surgery  ____  Stop metformin 2 days prior to surgery    ____ Take 1/2 of usual insulin dose the night before surgery and none on the morning of  surgery.   ____ Stop aspirin or coumadin, or plavix  x__ Stop Anti-inflammatories such as Advil, Aleve, Ibuprofen, Motrin, Naproxen,          Naprosyn, Goodies powders or aspirin products. Ok to take Tylenol.   _X___ Stop supplements until after surgery-STOP VITAMIN C AND SUPER B COMPLEX NOW  ____ Bring C-Pap to the hospital.

## 2015-10-28 ENCOUNTER — Encounter: Payer: Federal, State, Local not specified - PPO | Admitting: Internal Medicine

## 2015-10-28 ENCOUNTER — Encounter
Admission: RE | Admit: 2015-10-28 | Discharge: 2015-10-28 | Disposition: A | Discharge status: Home / Self Care | Payer: Federal, State, Local not specified - PPO | Source: Ambulatory Visit | Attending: Urology | Admitting: Urology

## 2015-10-28 DIAGNOSIS — Y842 Radiological procedure and radiotherapy as the cause of abnormal reaction of the patient, or of later complication, without mention of misadventure at the time of the procedure: Secondary | ICD-10-CM | POA: Insufficient documentation

## 2015-10-28 DIAGNOSIS — L598 Other specified disorders of the skin and subcutaneous tissue related to radiation: Secondary | ICD-10-CM | POA: Insufficient documentation

## 2015-10-28 DIAGNOSIS — E785 Hyperlipidemia, unspecified: Secondary | ICD-10-CM | POA: Diagnosis not present

## 2015-10-28 DIAGNOSIS — Z8546 Personal history of malignant neoplasm of prostate: Secondary | ICD-10-CM | POA: Diagnosis not present

## 2015-10-28 DIAGNOSIS — C61 Malignant neoplasm of prostate: Secondary | ICD-10-CM | POA: Insufficient documentation

## 2015-10-28 DIAGNOSIS — R338 Other retention of urine: Secondary | ICD-10-CM | POA: Diagnosis not present

## 2015-10-28 DIAGNOSIS — I10 Essential (primary) hypertension: Secondary | ICD-10-CM | POA: Insufficient documentation

## 2015-10-28 DIAGNOSIS — L89810 Pressure ulcer of head, unstageable: Secondary | ICD-10-CM | POA: Insufficient documentation

## 2015-10-28 DIAGNOSIS — N3041 Irradiation cystitis with hematuria: Secondary | ICD-10-CM | POA: Insufficient documentation

## 2015-10-28 NOTE — Progress Notes (Signed)
RIELEY, JAECKS ([id number]) Visit Report for 10/28/2015 Arrival Information Details Patient Name: [name], [name] Date of Service: 10/28/2015 8:00 AM Medical Record Patient Account Number: [id number] Number: Treating RN: 04-24-1955 (60 y.o. Other Clinician: Jacqulyn Bath Date of Birth/Sex: Male) Treating Dellia Nims [place] Primary Care Physician: Annye Asa Physician/Extender: G Referring Physician: Letha Cape in Treatment: 9 Visit Information History Since Last Visit Added or deleted any medications: No Patient Arrived: Ambulatory Any new allergies or adverse reactions: No Arrival Time: 07:45 Had a fall or experienced change in No Accompanied By: self activities of daily living that may affect Transfer Assistance: None risk of falls: Patient Identification Verified: Yes Signs or symptoms of abuse/neglect since last No Secondary Verification Process Yes visito Completed: Hospitalized since last visit: No Patient Requires Transmission-Based No Pain Present Now: No Precautions: Patient Has Alerts: No Electronic Signature(s) Signed: 10/28/2015 3:41:24 PM By: Lorine Bears RCP, RRT, CHT Entered By: Lorine Bears on 10/28/2015 08:58:21 Lunn, Macallan ([id number]) -------------------------------------------------------------------------------- Encounter Discharge Information Details Patient Name: [name] Date of Service: 10/28/2015 8:00 AM Medical Record Patient Account Number: [id number] Number: Treating RN: [birth year] (60 y.o. Other Clinician: Jacqulyn Bath Date of Birth/Sex: Male) Treating Dellia Nims [place] Primary Care Physician: Annye Asa Physician/Extender: G Referring Physician: Letha Cape in Treatment: 9 Encounter Discharge Information Items Discharge Pain Level: 0 Discharge Condition: Stable Ambulatory Status: Ambulatory Discharge Destination:  Home Private Transportation: Auto Accompanied By: self Schedule Follow-up Appointment: No Medication Reconciliation completed and No provided to Patient/Care Taeja Debellis: Clinical Summary of Care: Notes Patient has finished his prescribed course of 40 HBO treatments. Electronic Signature(s) Signed: 10/28/2015 3:41:24 PM By: Lorine Bears RCP, RRT, CHT Entered By: Lorine Bears on 10/28/2015 10:27:01 Champ, Keyandre ([id number]) -------------------------------------------------------------------------------- Vitals Details Patient Name: [name] Date of Service: 10/28/2015 8:00 AM Medical Record Patient Account Number: [id number] Number: Treating RN: Jul 04, 1955 (60 y.o. Other Clinician: Jacqulyn Bath Date of Birth/Sex: Male) Treating ROBSON, Funkley Primary Care Physician: Annye Asa Physician/Extender: G Referring Physician: Letha Cape in Treatment: 9 Vital Signs Time Taken: 07:45 Temperature (F): 98.1 Height (in): 65 Pulse (bpm): 66 Weight (lbs): 162 Respiratory Rate (breaths/min): 16 Body Mass Index (BMI): 27 Blood Pressure (mmHg): 136/82 Reference Range: 80 - 120 mg / dl Electronic Signature(s) Signed: 10/28/2015 3:41:24 PM By: Lorine Bears RCP, RRT, CHT Entered By: Becky Sax, Amado Nash on 10/28/2015 08:58:47

## 2015-10-29 NOTE — Progress Notes (Signed)
ADRON, CURY ([id number]) Visit Report for 10/28/2015 HBO Details Patient Name: [name], [name] Date of Service: 10/28/2015 8:00 AM Medical Record Patient Account Number: [id number] Number: Treating RN: 1955-03-15 (60 y.o. 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 End Date: Treatment Series Complete; HBO Discharge STRN/ORN Protocol Goals Outcome: Achieved HBO Treatment Details Treatment Number: 40 Patient Type: Outpatient Chamber Type: Monoplace Chamber Serial #: [id number] Treatment Protocol: 2.0 ATA with 90 minutes oxygen, and no air breaks Treatment Details Compression Rate Down: 1.5 psi / minute De-Compression Rate Up: 1.5 psi / minute Air breaks and breathing Compress Tx Pressure periods Decompress Decompress Begins Reached (leave unused spaces Begins Ends blank) Chamber Pressure (ATA) 1 2 - - - - - - 2 1 Clock Time (24 hr) 08:06 08:17 - - - - - - 09:48 09:58 Treatment Length: 112 (minutes) Treatment Segments: 4 Capillary Blood Glucose Pre Capillary Blood Glucose (mg/dl): Post Capillary Blood Glucose (mg/dl): Vital Signs Capillary Blood Glucose Reference Range: 80 - 120 mg / dl HBO Diabetic Blood Glucose Intervention Range: <131 mg/dl or >249 mg/dl Time Vitals Blood Respiratory Capillary Blood Glucose Pulse Action Type: Pulse: Temperature: Taken: Pressure: Rate: Glucose (mg/dl): Meter #: Oximetry (%) Taken: Pre 07:45 136/82 66 16 98.1 [place], [place] ([id number]) Post 10:05 142/84 96 16 98.2 Treatment  Response Treatment Well Toleration: Physician Notes No concerns with treatment given HBO Attestation I certify that I supervised this HBO treatment in accordance with Medicare guidelines. A trained Yes emergency response team is readily available per hospital policies and procedures. Continue HBOT as ordered. Yes Electronic Signature(s) Signed: 10/28/2015 5:12:34 PM By: Linton Ham MD Previous Signature: 10/28/2015 3:41:24 PM Version By: Lorine Bears RCP, RRT, CHT Entered By: Linton Ham on 10/28/2015 17:10:33 [place], [place] ([id number]) -------------------------------------------------------------------------------- HBO Safety Checklist Details Patient Name: [name] Date of Service: 10/28/2015 8:00 AM Medical Record Patient Account Number: [id number] Number: Treating RN: 10-31-1955 (60 y.o. Other Clinician: Jacqulyn Bath Date of Birth/Sex: Male) Treating [place], [place] Primary Care Physician: Annye Asa Physician/Extender: G Referring Physician: Letha Cape in Treatment: 9 HBO Safety Checklist Items Safety Checklist Consent Form Signed Patient voided / foley secured and emptied When did you last eato 10/27/15 pm Last dose of injectable or oral agent n/a NA Ostomy pouch emptied and vented if applicable NA All implantable devices assessed, documented and approved NA Intravenous access site secured and place Valuables secured Linens and cotton and cotton/polyester blend (less than 51% polyester) Personal oil-based products / skin lotions / body lotions removed NA Wigs or hairpieces removed NA Smoking or tobacco materials removed Books / newspapers / magazines / loose paper removed Cologne, aftershave, perfume and deodorant removed Jewelry removed (may wrap wedding band) NA Make-up removed Hair care products removed Battery operated devices (external) removed Heating patches and chemical warmers  removed NA Titanium eyewear removed NA Nail polish cured greater than 10 hours NA Casting material cured greater than 10 hours NA Hearing aids removed Loose dentures or partials removed NA Prosthetics have been removed Patient demonstrates correct use of air break device (if applicable) Patient concerns have been addressed Patient grounding bracelet on and cord attached to chamber Specifics for Inpatients (complete in addition to above) Medication  sheet sent with patient [name], [name] ([id number]) Intravenous medications needed or due during therapy sent with patient Drainage tubes (e.g. nasogastric tube or chest tube secured and vented) Endotracheal or Tracheotomy tube secured Cuff deflated of air and inflated with saline Airway suctioned Electronic Signature(s) Signed: 10/28/2015 3:41:24 PM By: Lorine Bears RCP, RRT, CHT Entered By: Lorine Bears on 10/28/2015 08:59:40

## 2015-10-30 NOTE — H&P (Signed)
NAMEDEKEN, [name] NO.:  [id number]  MEDICAL RECORD NO.:  [id number]  LOCATION:                                 FACILITY:  PHYSICIAN:  Maryan Puls          DATE OF BIRTH:  03/02/1955  DATE OF ADMISSION: DATE OF DISCHARGE:                            HISTORY AND PHYSICAL   Same-day surgery, October 31.  CHIEF COMPLAINT:  Right low back pain.  HISTORY OF PRESENT ILLNESS:  Mr. [name] is a 60 year old male, who presented to the office with right low back pain, associated with bladder spasms in early October.  Evaluation with IVP indicated moderate right hydronephrosis down to the level of the right UVJ.  A definite stone was not identified in this area.  The patient has a complex history dating back to initial diagnosis of stage T2 Gleason grade 3 + 4 adenocarcinoma of prostate.  He was treated with combination of external beam radiotherapy, brachytherapy, and androgen blockade at that time.  Since then, he has developed radiation cystitis with clot retention and urinary retention.  He underwent transurethral resection of the prostate, bladder fulguration, and clot evacuation in September.  He has completed a course of hyperbaric oxygen therapy.  His urinary symptoms have improved significantly and he has not had hematuria recently.  PAST MEDICAL HISTORY:  No drug allergies.  CURRENT MEDICATIONS:  Include lisinopril, multivitamins, and tamsulosin.  SURGICAL HISTORY: 1. Bilateral vasectomy in 1991. 2. Cystoscopy with clot evacuation, August 2017. 3. Cystoscopy with fulguration of the bladder, TURP, and clot     evacuation on September 23, 2015.  SOCIAL HISTORY:  The patient quit smoking in 2011 with a 30 pack-year history.  He consumes [id number] alcoholic beverages per week.  FAMILY HISTORY:  Parents have hypertension and hyperlipidemia.  PAST AND CURRENT MEDICAL CONDITIONS:  Hypertension.  REVIEW OF SYSTEMS:  The patient denies chest pain, shortness  of breath, diabetes, or stroke.  PHYSICAL EXAMINATION:  GENERAL:  A well-nourished male, in no acute distress. HEENT:  Sclerae are clear.  Pupils are equally round and react to light and accommodation.  Extraocular movements are intact. NECK:  No palpable cervical adenopathy.  No audible carotid bruits. LUNGS:  Clear to auscultation. CARDIOVASCULAR:  Regular rhythm and rate without audible murmurs. ABDOMEN:  Soft, nontender abdomen. GU:  Uncircumcised testes, smooth, nontender, 20 mL size each. RECTAL:  20 g flap, smooth, nontender prostate. NEUROMUSCULAR:  Alert and oriented x3.  IMPRESSION: 1. Right low back pain. 2. Right hydronephrosis. 3. Radiation cystitis. 4. Prostate cancer, status post combination radiation therapy with     external beam and brachytherapy. 5. Recent urinary retention, status post TURP in September.  PLAN:  Cystoscopy with right retrograde pyelogram, ureteroscopy, and possible other interventions.    ______________________________ Maryan Puls   ______________________________ Maryan Puls    MW/MEDQ  D:  10/29/2015  T:  10/30/2015  Job:  [id number] [id number]

## 2015-11-04 ENCOUNTER — Encounter: Payer: Self-pay | Admitting: *Deleted

## 2015-11-04 ENCOUNTER — Ambulatory Visit: Payer: Federal, State, Local not specified - PPO | Admitting: Anesthesiology

## 2015-11-04 ENCOUNTER — Encounter
Admission: RE | Disposition: A | Discharge status: Home / Self Care | Payer: Self-pay | Source: Ambulatory Visit | Attending: Urology

## 2015-11-04 ENCOUNTER — Ambulatory Visit
Admission: RE | Admit: 2015-11-04 | Discharge: 2015-11-04 | Disposition: A | Discharge status: Home / Self Care | Payer: Federal, State, Local not specified - PPO | Source: Ambulatory Visit | Attending: Urology | Admitting: Urology

## 2015-11-04 DIAGNOSIS — Z87891 Personal history of nicotine dependence: Secondary | ICD-10-CM | POA: Insufficient documentation

## 2015-11-04 DIAGNOSIS — I10 Essential (primary) hypertension: Secondary | ICD-10-CM | POA: Insufficient documentation

## 2015-11-04 DIAGNOSIS — M545 Low back pain: Secondary | ICD-10-CM | POA: Insufficient documentation

## 2015-11-04 DIAGNOSIS — Z79899 Other long term (current) drug therapy: Secondary | ICD-10-CM | POA: Diagnosis not present

## 2015-11-04 DIAGNOSIS — D400 Neoplasm of uncertain behavior of prostate: Secondary | ICD-10-CM | POA: Diagnosis not present

## 2015-11-04 DIAGNOSIS — R339 Retention of urine, unspecified: Secondary | ICD-10-CM | POA: Insufficient documentation

## 2015-11-04 DIAGNOSIS — N3040 Irradiation cystitis without hematuria: Secondary | ICD-10-CM | POA: Insufficient documentation

## 2015-11-04 DIAGNOSIS — C61 Malignant neoplasm of prostate: Secondary | ICD-10-CM | POA: Insufficient documentation

## 2015-11-04 DIAGNOSIS — N3090 Cystitis, unspecified without hematuria: Secondary | ICD-10-CM | POA: Diagnosis not present

## 2015-11-04 DIAGNOSIS — E785 Hyperlipidemia, unspecified: Secondary | ICD-10-CM | POA: Diagnosis not present

## 2015-11-04 DIAGNOSIS — Z8249 Family history of ischemic heart disease and other diseases of the circulatory system: Secondary | ICD-10-CM | POA: Diagnosis not present

## 2015-11-04 DIAGNOSIS — N1330 Unspecified hydronephrosis: Secondary | ICD-10-CM | POA: Insufficient documentation

## 2015-11-04 DIAGNOSIS — Y842 Radiological procedure and radiotherapy as the cause of abnormal reaction of the patient, or of later complication, without mention of misadventure at the time of the procedure: Secondary | ICD-10-CM | POA: Insufficient documentation

## 2015-11-04 DIAGNOSIS — N3041 Irradiation cystitis with hematuria: Secondary | ICD-10-CM | POA: Diagnosis not present

## 2015-11-04 HISTORY — PX: URETEROSCOPY: SHX842

## 2015-11-04 HISTORY — PX: CYSTOSCOPY W/ RETROGRADES: SHX1426

## 2015-11-04 SURGERY — CYSTOSCOPY, WITH RETROGRADE PYELOGRAM
Anesthesia: General | Laterality: Right | Wound class: Clean Contaminated

## 2015-11-04 MED ORDER — FAMOTIDINE 20 MG PO TABS
ORAL_TABLET | ORAL | Status: AC
  Administered 2015-11-04: 20 mg via ORAL
  Filled 2015-11-04: qty 1

## 2015-11-04 MED ORDER — ONDANSETRON HCL 4 MG/2ML IJ SOLN: 4.0000 mg | Freq: Once | INTRAMUSCULAR | Status: DC | PRN

## 2015-11-04 MED ORDER — DEXAMETHASONE SODIUM PHOSPHATE 10 MG/ML IJ SOLN
INTRAMUSCULAR | Status: DC | PRN
  Administered 2015-11-04: 10 mg via INTRAVENOUS

## 2015-11-04 MED ORDER — LIDOCAINE HCL 2 % EX GEL
CUTANEOUS | Status: DC | PRN
  Administered 2015-11-04: 1

## 2015-11-04 MED ORDER — EPHEDRINE SULFATE 50 MG/ML IJ SOLN
INTRAMUSCULAR | Status: DC | PRN
  Administered 2015-11-04: 5 mg via INTRAVENOUS

## 2015-11-04 MED ORDER — URIBEL 118 MG PO CAPS: 1.0000 | ORAL_CAPSULE | Freq: Four times a day (QID) | ORAL | 3 refills | Status: DC | PRN

## 2015-11-04 MED ORDER — LEVOFLOXACIN 500 MG PO TABS: 500.0000 mg | ORAL_TABLET | Freq: Every day | ORAL | 0 refills | Status: DC

## 2015-11-04 MED ORDER — LEVOFLOXACIN IN D5W 500 MG/100ML IV SOLN
INTRAVENOUS | Status: AC
  Administered 2015-11-04: 500 mg via INTRAVENOUS
  Filled 2015-11-04: qty 100

## 2015-11-04 MED ORDER — IOTHALAMATE MEGLUMINE 43 % IV SOLN
INTRAVENOUS | Status: DC | PRN
  Administered 2015-11-04: 20 mL

## 2015-11-04 MED ORDER — LACTATED RINGERS IV SOLN
INTRAVENOUS | Status: DC
  Administered 2015-11-04: 13:00:00 via INTRAVENOUS

## 2015-11-04 MED ORDER — PHENYLEPHRINE HCL 10 MG/ML IJ SOLN
INTRAMUSCULAR | Status: DC | PRN
  Administered 2015-11-04: 50 ug via INTRAVENOUS

## 2015-11-04 MED ORDER — GLYCOPYRROLATE 0.2 MG/ML IJ SOLN
INTRAMUSCULAR | Status: DC | PRN
  Administered 2015-11-04: 0.2 mg via INTRAVENOUS

## 2015-11-04 MED ORDER — FENTANYL CITRATE (PF) 100 MCG/2ML IJ SOLN
INTRAMUSCULAR | Status: DC | PRN
  Administered 2015-11-04 (×2): 25 ug via INTRAVENOUS
  Administered 2015-11-04: 50 ug via INTRAVENOUS

## 2015-11-04 MED ORDER — MIDAZOLAM HCL 2 MG/2ML IJ SOLN
INTRAMUSCULAR | Status: DC | PRN
  Administered 2015-11-04: 2 mg via INTRAVENOUS

## 2015-11-04 MED ORDER — IOPAMIDOL (ISOVUE-300) INJECTION 61%
INTRAVENOUS | Status: DC | PRN
Start: 2015-11-04 — End: 2015-11-04
  Administered 2015-11-04: 50 mL via INTRAVENOUS

## 2015-11-04 MED ORDER — FENTANYL CITRATE (PF) 100 MCG/2ML IJ SOLN
INTRAMUSCULAR | Status: AC
  Filled 2015-11-04: qty 2

## 2015-11-04 MED ORDER — BELLADONNA ALKALOIDS-OPIUM 16.2-60 MG RE SUPP
RECTAL | Status: AC
  Filled 2015-11-04: qty 1

## 2015-11-04 MED ORDER — LEVOFLOXACIN IN D5W 500 MG/100ML IV SOLN
500.0000 mg | Freq: Once | INTRAVENOUS | Status: AC
  Administered 2015-11-04: 500 mg via INTRAVENOUS

## 2015-11-04 MED ORDER — BELLADONNA ALKALOIDS-OPIUM 16.2-60 MG RE SUPP
RECTAL | Status: DC | PRN
  Administered 2015-11-04: 1 via RECTAL

## 2015-11-04 MED ORDER — PROPOFOL 10 MG/ML IV BOLUS
INTRAVENOUS | Status: DC | PRN
  Administered 2015-11-04: 170 mg via INTRAVENOUS

## 2015-11-04 MED ORDER — FAMOTIDINE 20 MG PO TABS
20.0000 mg | ORAL_TABLET | Freq: Once | ORAL | Status: AC
  Administered 2015-11-04: 20 mg via ORAL

## 2015-11-04 MED ORDER — LIDOCAINE HCL 2 % EX GEL
CUTANEOUS | Status: AC
  Filled 2015-11-04: qty 10

## 2015-11-04 MED ORDER — LIDOCAINE HCL (CARDIAC) 20 MG/ML IV SOLN
INTRAVENOUS | Status: DC | PRN
  Administered 2015-11-04: 60 mg via INTRAVENOUS

## 2015-11-04 MED ORDER — FENTANYL CITRATE (PF) 100 MCG/2ML IJ SOLN
25.0000 ug | INTRAMUSCULAR | Status: AC | PRN
  Administered 2015-11-04 (×6): 25 ug via INTRAVENOUS

## 2015-11-04 SURGICAL SUPPLY — 24 items
BAG DRAIN CYSTO-URO LG1000N (MISCELLANEOUS) ×2 IMPLANT
CATH FOL LX CONE TIP  8F (CATHETERS) ×1
CATH FOL LX CONE TIP 8F (CATHETERS) ×1 IMPLANT
CATH URETL 5X70 OPEN END (CATHETERS) ×2 IMPLANT
CONRAY 43 FOR UROLOGY 50M (MISCELLANEOUS) ×2 IMPLANT
GLOVE BIO SURGEON STRL SZ7 (GLOVE) ×4 IMPLANT
GLOVE BIO SURGEON STRL SZ7.5 (GLOVE) ×2 IMPLANT
GOWN STRL REUS W/ TWL LRG LVL3 (GOWN DISPOSABLE) ×1 IMPLANT
GOWN STRL REUS W/ TWL LRG LVL4 (GOWN DISPOSABLE) ×1 IMPLANT
GOWN STRL REUS W/ TWL XL LVL3 (GOWN DISPOSABLE) ×1 IMPLANT
GOWN STRL REUS W/TWL LRG LVL3 (GOWN DISPOSABLE) ×1
GOWN STRL REUS W/TWL LRG LVL4 (GOWN DISPOSABLE) ×1
GOWN STRL REUS W/TWL XL LVL3 (GOWN DISPOSABLE) ×1
GUIDEWIRE STR ZIPWIRE 035X150 (MISCELLANEOUS) ×2 IMPLANT
KIT RM TURNOVER CYSTO AR (KITS) ×2 IMPLANT
PACK CYSTO AR (MISCELLANEOUS) ×2 IMPLANT
PREP PVP WINGED SPONGE (MISCELLANEOUS) IMPLANT
SET CYSTO W/LG BORE CLAMP LF (SET/KITS/TRAYS/PACK) ×2 IMPLANT
SOL .9 NS 3000ML IRR  AL (IV SOLUTION) ×1
SOL .9 NS 3000ML IRR UROMATIC (IV SOLUTION) ×1 IMPLANT
SOL PREP PVP 2OZ (MISCELLANEOUS) ×2
SOLUTION PREP PVP 2OZ (MISCELLANEOUS) ×1 IMPLANT
SURGILUBE 2OZ TUBE FLIPTOP (MISCELLANEOUS) ×2 IMPLANT
WATER STERILE IRR 1000ML POUR (IV SOLUTION) ×2 IMPLANT

## 2015-11-04 NOTE — H&P (Signed)
Date of Initial H&P: 10/30/15  History reviewed, patient examined, no change in status, stable for surgery.

## 2015-11-04 NOTE — Anesthesia Preprocedure Evaluation (Signed)
Anesthesia Evaluation  Patient identified by MRN, date of birth, ID band Patient awake    Reviewed: Allergy & Precautions, NPO status , Patient's Chart, lab work & pertinent test results, reviewed documented beta blocker date and time   Airway Mallampati: II  TM Distance: >3 FB     Dental  (+) Chipped   Pulmonary former smoker,           Cardiovascular hypertension, Pt. on medications and Pt. on home beta blockers      Neuro/Psych    GI/Hepatic   Endo/Other    Renal/GU      Musculoskeletal   Abdominal   Peds  Hematology   Anesthesia Other Findings   Reproductive/Obstetrics                             Anesthesia Physical Anesthesia Plan  ASA: III  Anesthesia Plan: General   Post-op Pain Management:    Induction: Intravenous  Airway Management Planned: LMA  Additional Equipment:   Intra-op Plan:   Post-operative Plan:   Informed Consent: I have reviewed the patients History and Physical, chart, labs and discussed the procedure including the risks, benefits and alternatives for the proposed anesthesia with the patient or authorized representative who has indicated his/her understanding and acceptance.     Plan Discussed with: CRNA  Anesthesia Plan Comments:         Anesthesia Quick Evaluation

## 2015-11-04 NOTE — Discharge Instructions (Signed)
Cystoscopy, Care After  Refer to this sheet in the next few weeks. These instructions provide you with information on caring for yourself after your procedure. Your caregiver may also give you more specific instructions. Your treatment has been planned according to current medical practices, but problems sometimes occur. Call your caregiver if you have any problems or questions after your procedure.  HOME CARE INSTRUCTIONS    Things you can do to ease any discomfort after your procedure include:  · Drinking enough water and fluids to keep your urine clear or pale yellow.  · Taking a warm bath to relieve any burning feelings.  SEEK IMMEDIATE MEDICAL CARE IF:    · You have an increase in blood in your urine.  · You notice blood clots in your urine.  · You have difficulty passing urine.  · You have the chills.  · You have abdominal pain.  · You have a fever or persistent symptoms for more than 2-3 days.  · You have a fever and your symptoms suddenly get worse.  MAKE SURE YOU:    · Understand these instructions.  · Will watch your condition.  · Will get help right away if you are not doing well or get worse.     This information is not intended to replace advice given to you by your health care provider. Make sure you discuss any questions you have with your health care provider.     Document Released: 07/10/2004 Document Revised: 01/11/2014 Document Reviewed: 06/14/2011  Elsevier Interactive Patient Education ©2016 [employer]   

## 2015-11-04 NOTE — Anesthesia Postprocedure Evaluation (Signed)
Anesthesia Post Note  Patient: [name]  Procedure(s) Performed: Procedure(s) (LRB): CYSTOSCOPY WITH RETROGRADE PYELOGRAM (Right) URETEROSCOPY (Right)  Patient location during evaluation: PACU Anesthesia Type: General Level of consciousness: awake and alert Pain management: pain level controlled Vital Signs Assessment: post-procedure vital signs reviewed and stable Respiratory status: spontaneous breathing, nonlabored ventilation, respiratory function stable and patient connected to nasal cannula oxygen Cardiovascular status: blood pressure returned to baseline and stable Postop Assessment: no signs of nausea or vomiting Anesthetic complications: no    Last Vitals:  Vitals:   11/04/15 1615 11/04/15 1637  BP: (!) 191/89 (!) 192/92  Pulse:    Resp: 16   Temp: 36.3 C     Last Pain:  Vitals:   11/04/15 1615  TempSrc:   PainSc: 0-No pain                 Oluchi Pucci S

## 2015-11-04 NOTE — Op Note (Signed)
Preoperative diagnosis: 1. Right hydronephrosis                                             2. Radiation cystitis  Postoperative diagnosis: Same  Procedure:   1. Cystoscopy                        2. Intraoperative IVP  Surgeon: Otelia Limes. Yves Dill MD  Anesthesia: General  Indications:See the history and physical. After informed consent the above procedure(s) were requested     Technique and findings: After adequate general anesthesia been obtained patient was placed into dorsolithotomy position and the perineum was prepped and draped in the usual fashion. 94 [place] the scope was coupled the camera and visually advanced into the bladder. Patient had a fibrotic prostatic fossa with necrotic eschar present. Bladder was moderately trabeculated. No bladder tumors were identified. Due to the fibrotic nature of the bladder neck and trigone the ureteral orifices could not be identified. The cystoscope was removed and mini short ureteroscope was advanced into the bladder. An attempt was made to identify the orifices by air fully examining the fibrotic area of the bladder neck but the orifices could not be identified. 33 m milliliters of Isovue was administered by the anesthetist and intraoperative IVP performed. Contrast was excreted by both kidneys and there was mild hydronephrosis on the right down to the UVJ. The area of distal or that was visible on IVP was lined up with the ureteroscope but again the orifice could not be identified. At this point the ureteroscope was removed and 10 cc of viscous Xylocaine instilled within the urethra. A B&O suppository was placed and the procedure was terminated. Patient was then transferred to the recovery room in stable condition.

## 2015-11-04 NOTE — Anesthesia Procedure Notes (Signed)
Procedure Name: LMA Insertion Date/Time: 11/04/2015 1:40 PM Performed by: Dionne Bucy Pre-anesthesia Checklist: Patient identified, Patient being monitored, Timeout performed, Emergency Drugs available and Suction available Patient Re-evaluated:Patient Re-evaluated prior to inductionOxygen Delivery Method: Circle system utilized Preoxygenation: Pre-oxygenation with 100% oxygen Intubation Type: IV induction Ventilation: Mask ventilation without difficulty LMA: LMA inserted LMA Size: 5.0 Tube type: Oral Number of attempts: 1 Placement Confirmation: positive ETCO2 and breath sounds checked- equal and bilateral Tube secured with: Tape Dental Injury: Teeth and Oropharynx as per pre-operative assessment

## 2015-11-04 NOTE — Transfer of Care (Signed)
Immediate Anesthesia Transfer of Care Note  Patient: [name]  Procedure(s) Performed: Procedure(s): CYSTOSCOPY WITH RETROGRADE PYELOGRAM (Right) URETEROSCOPY (Right)  Patient Location: PACU  Anesthesia Type:General  Level of Consciousness: awake and patient cooperative  Airway & Oxygen Therapy: Patient Spontanous Breathing and Patient connected to face mask oxygen  Post-op Assessment: Report given to RN and Post -op Vital signs reviewed and stable  Post vital signs: Reviewed and stable  Last Vitals:  Vitals:   11/04/15 1309 11/04/15 1508  BP: (!) 192/90 (!) 156/90  Pulse: 77 79  Resp: 16 13  Temp: 36.6 C 96.79F    Last Pain:  Vitals:   11/04/15 1309  TempSrc: Oral  PainSc: 0-No pain         Complications: No apparent anesthesia complications

## 2015-11-05 ENCOUNTER — Encounter: Payer: Self-pay | Admitting: Urology

## 2015-11-11 DIAGNOSIS — N1339 Other hydronephrosis: Secondary | ICD-10-CM | POA: Diagnosis not present

## 2015-11-11 DIAGNOSIS — D400 Neoplasm of uncertain behavior of prostate: Secondary | ICD-10-CM | POA: Diagnosis not present

## 2015-11-11 DIAGNOSIS — C61 Malignant neoplasm of prostate: Secondary | ICD-10-CM | POA: Diagnosis not present

## 2015-11-13 ENCOUNTER — Other Ambulatory Visit: Payer: Self-pay | Admitting: Family Medicine

## 2015-11-20 ENCOUNTER — Ambulatory Visit (INDEPENDENT_AMBULATORY_CARE_PROVIDER_SITE_OTHER): Payer: Federal, State, Local not specified - PPO | Admitting: Family Medicine

## 2015-11-20 ENCOUNTER — Encounter: Payer: Self-pay | Admitting: Family Medicine

## 2015-11-20 ENCOUNTER — Encounter: Payer: Federal, State, Local not specified - PPO | Admitting: Family Medicine

## 2015-11-20 VITALS — BP 126/82 | HR 72 | Temp 98.6°F | Resp 16 | Ht 65.0 in | Wt 159.1 lb

## 2015-11-20 DIAGNOSIS — Z0000 Encounter for general adult medical examination without abnormal findings: Secondary | ICD-10-CM | POA: Diagnosis not present

## 2015-11-20 LAB — LIPID PANEL
CHOL/HDL RATIO: 3
Cholesterol: 177 mg/dL (ref 0–200)
HDL: 70.4 mg/dL (ref >39.00)
LDL CALC: 93 mg/dL (ref 0–99)
NonHDL: 106.24
TRIGLYCERIDES: 64 mg/dL (ref 0.0–149.0)
VLDL: 12.8 mg/dL (ref 0.0–40.0)

## 2015-11-20 LAB — TSH: TSH: 1.15 u[IU]/mL (ref 0.35–4.50)

## 2015-11-20 LAB — BASIC METABOLIC PANEL
BUN: 16 mg/dL (ref 6–23)
CO2: 28 mEq/L (ref 19–32)
Calcium: 9.4 mg/dL (ref 8.4–10.5)
Chloride: 103 mEq/L (ref 96–112)
Creatinine, Ser: 0.89 mg/dL (ref 0.40–1.50)
GFR: 91.98 mL/min (ref >60.00)
Glucose, Bld: 85 mg/dL (ref 70–99)
POTASSIUM: 4.4 meq/L (ref 3.5–5.1)
Sodium: 139 mEq/L (ref 135–145)

## 2015-11-20 LAB — CBC WITH DIFFERENTIAL/PLATELET
Basophils Absolute: 0 10*3/uL (ref 0.0–0.1)
Basophils Relative: 0.4 % (ref 0.0–3.0)
EOS PCT: 0.3 % (ref 0.0–5.0)
Eosinophils Absolute: 0 10*3/uL (ref 0.0–0.7)
HEMATOCRIT: 39 % (ref 39.0–52.0)
HEMOGLOBIN: 13.1 g/dL (ref 13.0–17.0)
LYMPHS ABS: 1.5 10*3/uL (ref 0.7–4.0)
Lymphocytes Relative: 20.9 % (ref 12.0–46.0)
MCHC: 33.5 g/dL (ref 30.0–36.0)
MCV: 92.8 fl (ref 78.0–100.0)
MONOS PCT: 7.6 % (ref 3.0–12.0)
Monocytes Absolute: 0.6 10*3/uL (ref 0.1–1.0)
Neutro Abs: 5.2 10*3/uL (ref 1.4–7.7)
Neutrophils Relative %: 70.8 % (ref 43.0–77.0)
Platelets: 320 10*3/uL (ref 150.0–400.0)
RBC: 4.2 Mil/uL — AB (ref 4.22–5.81)
RDW: 15.6 % — ABNORMAL HIGH (ref 11.5–15.5)
WBC: 7.3 10*3/uL (ref 4.0–10.5)

## 2015-11-20 LAB — HEPATIC FUNCTION PANEL
ALT: 13 U/L (ref 0–53)
AST: 15 U/L (ref 0–37)
Albumin: 4.3 g/dL (ref 3.5–5.2)
Alkaline Phosphatase: 43 U/L (ref 39–117)
BILIRUBIN TOTAL: 0.7 mg/dL (ref 0.2–1.2)
Bilirubin, Direct: 0.2 mg/dL (ref 0.0–0.3)
Total Protein: 7.6 g/dL (ref 6.0–8.3)

## 2015-11-20 MED ORDER — LISINOPRIL 40 MG PO TABS: 40.0000 mg | ORAL_TABLET | Freq: Every day | ORAL | 1 refills | Status: DC

## 2015-11-20 MED ORDER — VERAPAMIL HCL ER 360 MG PO CP24: ORAL_CAPSULE | ORAL | 1 refills | Status: DC

## 2015-11-20 MED ORDER — NEBIVOLOL HCL 5 MG PO TABS: ORAL_TABLET | ORAL | 1 refills | Status: DC

## 2015-11-20 NOTE — Progress Notes (Signed)
   Subjective:    Patient ID: [name], male    DOB: Dec 31, 1955, 60 y.o.   MRN: [id number]  HPI CPE- UTD on colonoscopy (due 2022), Tdap.  UTD w/ urology.  Exercising regularly.   Review of Systems Patient reports no vision/hearing changes, anorexia, fever ,adenopathy, persistant/recurrent hoarseness, swallowing issues, chest pain, palpitations, edema, persistant/recurrent cough, hemoptysis, dyspnea (rest,exertional, paroxysmal nocturnal), gastrointestinal  bleeding (melena, rectal bleeding), abdominal pain, excessive heart burn, syncope, focal weakness, memory loss, numbness & tingling, skin/hair/nail changes, depression, anxiety, abnormal bruising/bleeding, musculoskeletal symptoms/signs.     Objective:   Physical Exam General Appearance:    Alert, cooperative, no distress, appears stated age  Head:    Normocephalic, without obvious abnormality, atraumatic  Eyes:    PERRL, conjunctiva/corneas clear, EOM's intact, fundi    benign, both eyes       Ears:    Normal TM's and external ear canals, both ears  Nose:   Nares normal, septum midline, mucosa normal, no drainage   or sinus tenderness  Throat:   Lips, mucosa, and tongue normal; teeth and gums normal  Neck:   Supple, symmetrical, trachea midline, no adenopathy;       thyroid:  No enlargement/tenderness/nodules  Back:     Symmetric, no curvature, ROM normal, no CVA tenderness  Lungs:     Clear to auscultation bilaterally, respirations unlabored  Chest wall:    No tenderness or deformity  Heart:    Regular rate and rhythm, S1 and S2 normal, no murmur, rub   or gallop  Abdomen:     Soft, non-tender, bowel sounds active all four quadrants,    no masses, no organomegaly  Genitalia:    Deferred to urology  Rectal:    Extremities:   Extremities normal, atraumatic, no cyanosis or edema  Pulses:   2+ and symmetric all extremities  Skin:   Skin color, texture, turgor normal, no rashes or lesions  Lymph nodes:   Cervical,  supraclavicular, and axillary nodes normal  Neurologic:   CNII-XII intact. Normal strength, sensation and reflexes      throughout          Assessment & Plan:

## 2015-11-20 NOTE — Assessment & Plan Note (Signed)
Pt's PE WNL.  UTD on colonoscopy, Tdap.  Declines flu shot.  Check labs.  Anticipatory guidance provided.

## 2015-11-20 NOTE — Progress Notes (Signed)
Pre visit review using our clinic review tool, if applicable. No additional management support is needed unless otherwise documented below in the visit note. 

## 2015-11-20 NOTE — Patient Instructions (Signed)
Follow up in 6 months to recheck cholesterol and BP We'll notify you of your lab results and make any changes if needed Continue to work on healthy diet and regular exercise- you look great!!! Call with any questions or concerns Happy Holidays!!!

## 2015-11-25 DIAGNOSIS — R310 Gross hematuria: Secondary | ICD-10-CM | POA: Diagnosis not present

## 2015-11-25 DIAGNOSIS — N5201 Erectile dysfunction due to arterial insufficiency: Secondary | ICD-10-CM | POA: Diagnosis not present

## 2015-11-25 DIAGNOSIS — N3041 Irradiation cystitis with hematuria: Secondary | ICD-10-CM | POA: Diagnosis not present

## 2015-12-09 DIAGNOSIS — N200 Calculus of kidney: Secondary | ICD-10-CM | POA: Diagnosis not present

## 2016-01-13 DIAGNOSIS — D400 Neoplasm of uncertain behavior of prostate: Secondary | ICD-10-CM | POA: Diagnosis not present

## 2016-01-13 DIAGNOSIS — C61 Malignant neoplasm of prostate: Secondary | ICD-10-CM | POA: Diagnosis not present

## 2016-01-13 DIAGNOSIS — N5201 Erectile dysfunction due to arterial insufficiency: Secondary | ICD-10-CM | POA: Diagnosis not present

## 2016-01-13 DIAGNOSIS — N3041 Irradiation cystitis with hematuria: Secondary | ICD-10-CM | POA: Diagnosis not present

## 2016-02-17 ENCOUNTER — Telehealth: Payer: Self-pay | Admitting: Family Medicine

## 2016-02-17 MED ORDER — VERAPAMIL HCL ER 360 MG PO CP24: ORAL_CAPSULE | ORAL | 1 refills | Status: DC

## 2016-02-17 NOTE — Telephone Encounter (Signed)
Please rx medication verapamil (VERELAN PM) 360 MG 24 hr capsule. Please send to  [id number] - [place], [place] [phone] (Phone) [phone] (Fax)   Please call patient once done at [phone].  Thank you

## 2016-02-17 NOTE — Telephone Encounter (Signed)
Medication filled to pharmacy as requested.   

## 2016-02-26 DIAGNOSIS — N3041 Irradiation cystitis with hematuria: Secondary | ICD-10-CM | POA: Diagnosis not present

## 2016-02-26 DIAGNOSIS — D400 Neoplasm of uncertain behavior of prostate: Secondary | ICD-10-CM | POA: Diagnosis not present

## 2016-02-26 DIAGNOSIS — N5201 Erectile dysfunction due to arterial insufficiency: Secondary | ICD-10-CM | POA: Diagnosis not present

## 2016-02-26 DIAGNOSIS — R310 Gross hematuria: Secondary | ICD-10-CM | POA: Diagnosis not present

## 2016-03-03 ENCOUNTER — Encounter: Payer: Federal, State, Local not specified - PPO | Attending: Internal Medicine | Admitting: Internal Medicine

## 2016-03-03 DIAGNOSIS — N3041 Irradiation cystitis with hematuria: Secondary | ICD-10-CM | POA: Insufficient documentation

## 2016-03-03 DIAGNOSIS — L598 Other specified disorders of the skin and subcutaneous tissue related to radiation: Secondary | ICD-10-CM | POA: Diagnosis not present

## 2016-03-03 DIAGNOSIS — Z8546 Personal history of malignant neoplasm of prostate: Secondary | ICD-10-CM | POA: Insufficient documentation

## 2016-03-03 DIAGNOSIS — I10 Essential (primary) hypertension: Secondary | ICD-10-CM | POA: Insufficient documentation

## 2016-03-03 DIAGNOSIS — R339 Retention of urine, unspecified: Secondary | ICD-10-CM | POA: Diagnosis not present

## 2016-03-03 NOTE — Progress Notes (Signed)
ROMANUS, OSADA ([id number]) Visit Report for 03/03/2016 HBO Risk Assessment Details Patient Name: [name], [name] Date of Service: 03/03/2016 9:45 AM Medical Record Number: [id number] Patient Account Number: [id number] Date of Birth/Sex: 1955/12/27 (61 y.o. Male) Treating RN: Cornell Barman Primary Care Luke Falero: Annye Asa Other Clinician: Referring Johnavon Mcclafferty: Annye Asa Treating Keyundra Fant/Extender: Ricard Dillon Weeks in Treatment: 0 HBO Risk Assessment Items Answer Barotrauma Risks: Upper Respiratory Infections No Prior Radiation Treatment to Head/Neck No Tracheostomy No Ear problems or surgery (otosclerosis)- Consider pressure equalization tubes No Sinus Problems, Sinus Obstruction No Pulmonary Risks: Currently seeing a pulmonologisto No Emphysema No Pneumothorax No Tuberculosis No Other lung problems (COPD with CO2 retention, lesions, surgery) -Refer to CPGs No Congestive heart Failure -Consider holding HBO if ejection fraction<30% No History of smoking No Bullous Disease, Blebs No Other pulmonary abnormalities No Cardiac Risks: Currently seeing a cardiologisto No Pacemaker/AICD No Hypertension No Diuretic Used (water pill). If yes, last time taken: No History of prior or current malignancy (Cancer) Surgery No Radiation therapy Yes If Yes for Radiation Therapy, number of treatments received: 150 Chemotherapy No Ophthalmic Risks: Optic Neuritis No [place], [place] ([id number]) Cataracts No Myopia No Retinopathy or Retinal Detachment Surgery- Consider pressure equalization tubes No Confinement Anxiety Claustrophobia No Dialysis Dialysis No Any implants; medical or non-medical No Pregnancy No Diabetes HgbA1C within 3 months No Seizures Seizures No Currently using these medications: Aspirin No Digoxin (CHF patient) No Narcotics No Nitroprusside No Phenothiazine (Thorazine,etc.) No Prednisone or other steroids No Disulfiram (Antabuse) No Mafenide  Acetate (Sulfamylon-burn cream) No Amiodarone No Date of last Chest X-ray: 08/30/2015 Date of last EKG: 10/28/2015 Date of Last CBC: 11/20/2015 Electronic Signature(s) Signed: 03/03/2016 9:39:59 AM By: Gretta Cool, RN, BSN, Kim RN, BSN Entered By: Gretta Cool, RN, BSN, Kim on 03/03/2016 09:39:58

## 2016-03-04 ENCOUNTER — Encounter: Payer: Federal, State, Local not specified - PPO | Attending: Surgery | Admitting: Surgery

## 2016-03-04 DIAGNOSIS — Z8546 Personal history of malignant neoplasm of prostate: Secondary | ICD-10-CM | POA: Insufficient documentation

## 2016-03-04 DIAGNOSIS — N3041 Irradiation cystitis with hematuria: Secondary | ICD-10-CM | POA: Diagnosis not present

## 2016-03-04 DIAGNOSIS — L598 Other specified disorders of the skin and subcutaneous tissue related to radiation: Secondary | ICD-10-CM | POA: Insufficient documentation

## 2016-03-04 NOTE — Progress Notes (Signed)
[name], [name] ([id number]) Visit Report for 03/03/2016 Chief Complaint Document Details Patient Name: [name], [name] Date of Service: 03/03/2016 9:45 AM Medical Record Number: [id number] Patient Account Number: [id number] Date of Birth/Sex: May 14, 1955 (61 y.o. Male) Treating RN: Ahmed Prima Primary Care Provider: Annye Asa Other Clinician: Referring Provider: Annye Asa Treating Provider/Extender: Ricard Dillon Weeks in Treatment: 0 Information Obtained from: Patient Chief Complaint 08/26/15; patient is referred for consideration of hyperbaric oxygen treatment secondary to radiation cystitis 03/03/16; patient returns with recurrent hematuria with clots for consideration of a repeat treatment with hyperbarics Electronic Signature(s) Signed: 03/03/2016 3:56:26 PM By: Linton Ham MD Entered By: Linton Ham on 03/03/2016 12:15:35 [place], Eron ([id number]) -------------------------------------------------------------------------------- HPI Details Patient Name: [name] Date of Service: 03/03/2016 9:45 AM Medical Record Number: [id number] Patient Account Number: [id number] Date of Birth/Sex: [birth year] (61 y.o. Male) Treating RN: Ahmed Prima Primary Care Provider: Annye Asa Other Clinician: Referring Provider: Annye Asa Treating Provider/Extender: Ricard Dillon Weeks in Treatment: 0 History of Present Illness HPI Description: 08/26/15; this is a patient who is been referred from his urologist Dr. 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 The patient was diagnosed as having adenocarcinoma of the prostate T-2 B N0 M0 Gleason score of 7 in 2013. This was discovered based on a screening PSA of 7.5, the patient was not symptomatic at that time he tells me. He elected to to undergo combination therapy. He received 4500 cGy terminal beam radiation over 5  weeks followed by I/125 seed implantation. Total of 57 seeds were placed with a total mCi dosage of 19.9 mCi. He also received anti androgen therapy. The patient tells me he did quite well and really was asymptomatic until roughly 3-4 weeks ago when he developed acute urinary retention vomiting a trip to the emergency room. Apparently prior to the urinary retention he had passed a few small clots in his urine. He required a catheter placement on July 29. Bladder scan at that time showed greater than 500 cc of urine in his bladder. The catheter was removed however the patient could not void. He has since been doing in and out catheterizations. 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 Erythematous patches were noted within the bladder wall compatible with radiation cystitis. Urinalysis was obtained I don't have these results. The patient was started on Repaflo to help with the retention. He is still having some frank bleeding this morning according to his wife. He feels as though his spontaneous voiding is improving however he is still doing when necessary in and out caths 09/17/15; the patient is seen today for follow-up visit in conjunction with hyperbaric oxygen treatment for late effect radiation cystitis. He states after starting hyperbarics last week I believe he had what he felt was improved urine flow. However over the weekend he had a problem. Staining on 4:00 he did an in and out catheter for 400 cc of clear urine. 4 hours later he didn't another in and out catheter for grossly bloody urine. Later that evening he had to void but he could not because of blood clots and ended up in the emergency room where he had a painful set of procedures to put a catheter in place ultimately done by urology. He now has a indwelling Foley catheter, still passing some bloody urine and some blood clots in the leg bag. He was able to show this to me today. He has a  follow-up with his urologist on Monday 09/30/15; the patient is seen today in routine follow-up for his ongoing hyperbaric oxygen treatment for late effect radiation cystitis. He has had a difficult time of late being in the ER on 2 occasions on 9/17 with urinary retention. This was in the face of an ongoing Foley catheter. The catheter was irrigated. He saw Dr. Yves Dill his urologist on 9/19. He had a TURP clot evacuation and bladder fulguration most of the bleeding appears to been in the prosthetic urethra although there was several large clots also present in the bladder. There was also several areas of hemorrhagic cystitis along the posterior wall which were fulgurated. He saw Dr. Yves Dill in follow-up yesterday. The Foley catheter was removed. He is able to void roughly 100 cc and then has 400 cc of urine via in and out catheterization. He has had no further bleeding. Feels HBO has made his spontaneous voiding improved 03/03/16; the patient is referred by Dr. Boneta Lucks his urologist for review review of hemorrhagic radiation cystitis. His history is still essentially the same as above. 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 While being treated with hyperbarics he noted improvement in the hematuria but also the fact that he did not have to do in and [place], [place] ([id number]) out catheters anymore for urinary retention. He presented to Dr. Yves Dill on 02/26/16 with a recent history now of" flash" hematuria and he has passed a few clots. He is not complaining of dysuria hasn't any obstructive voiding symptoms. He is on Elmiron and Rapaflo. Elmiron was recently discontinued Electronic Signature(s) Signed: 03/03/2016 3:56:26 PM By: Linton Ham MD Entered By: Linton Ham on 03/03/2016 12:27:04 [name]  ([id number]) -------------------------------------------------------------------------------- Physical Exam Details Patient Name: [name] Date of Service: 03/03/2016 9:45 AM Medical Record Number: [id number] Patient Account Number: [id number] Date of Birth/Sex: 05-Apr-1955 (61 y.o. Male) Treating RN: Ahmed Prima Primary Care Provider: Annye Asa Other Clinician: Referring Provider: Annye Asa Treating Provider/Extender: Ricard Dillon Weeks in Treatment: 0 Constitutional Patient is hypertensive.. Pulse regular and within target range for patient.Marland Kitchen Respirations regular, non-labored and within target range.. Temperature is normal and within the target range for the patient.. Patient's appearance is neat and clean. Appears in no acute distress. Well nourished and well developed.Marland Kitchen Respiratory Respiratory effort is easy and symmetric bilaterally. Rate is normal at rest and on room air.. Bilateral breath sounds are clear and equal in all lobes with no wheezes, rales or rhonchi.. Cardiovascular Heart rhythm and rate regular, without murmur or gallop.. Gastrointestinal (GI) Abdomen is soft and non-distended without masses or tenderness. Bowel sounds active in all quadrants.. No liver or spleen enlargement or tenderness.. Electronic Signature(s) Signed: 03/03/2016 3:56:26 PM By: Linton Ham MD Entered By: Linton Ham on 03/03/2016 12:27:43 [name] ([id number]) -------------------------------------------------------------------------------- Physician Orders Details Patient Name: [name] Date of Service: 03/03/2016 9:45 AM Medical Record Number: [id number] Patient Account Number: [id number] Date of Birth/Sex: [birth year] (61 y.o. Male) Treating RN: Ahmed Prima Primary Care Provider: Annye Asa Other Clinician: Referring Provider: Annye Asa Treating Provider/Extender: Tito Dine in Treatment: 0 Verbal / Phone  Orders: Yes ClinicianCarolyne Fiscal, Debi Read Back and Verified: Yes Diagnosis Coding Hyperbaric Oxygen Therapy o Evaluate for HBO Therapy o Indication: - soft tissue radionecrosis o If appropriate for treatment, begin HBOT per protocol: o 2.0 ATA for 90 Minutes without Air Breaks o One treatment per day (delivered Monday through Friday unless otherwise specified in  Special Instructions below): o Total # of Treatments: - 40 Electronic Signature(s) Signed: 03/03/2016 3:56:26 PM By: Linton Ham MD Entered By: Linton Ham on 03/03/2016 12:28:29 Rashod, Klase Parthiv ([id number]) -------------------------------------------------------------------------------- Problem List Details Patient Name: [name] Date of Service: 03/03/2016 9:45 AM Medical Record Number: [id number] Patient Account Number: [id number] Date of Birth/Sex: 09/26/1955 (61 y.o. Male) Treating RN: Ahmed Prima Primary Care Provider: Annye Asa Other Clinician: Referring Provider: Annye Asa Treating Provider/Extender: Tito Dine in Treatment: 0 Active Problems ICD-10 Encounter Code Description Active Date Diagnosis N30.41 Irradiation cystitis with hematuria 03/03/2016 Yes L59.8 Other specified disorders of the skin and subcutaneous 03/03/2016 Yes tissue related to radiation Z85.46 Personal history of malignant neoplasm of prostate 03/03/2016 Yes Inactive Problems Resolved Problems Electronic Signature(s) Signed: 03/03/2016 3:56:26 PM By: Linton Ham MD Entered By: Linton Ham on 03/03/2016 12:14:30 [place], Arlis ([id number]) -------------------------------------------------------------------------------- Progress Note Details Patient Name: [name] Date of Service: 03/03/2016 9:45 AM Medical Record Number: [id number] Patient Account Number: [id number] Date of Birth/Sex: 07-23-1955 (61 y.o. Male) Treating RN: Ahmed Prima Primary Care Provider: Annye Asa Other Clinician: Referring Provider: Annye Asa Treating Provider/Extender: Ricard Dillon Weeks in Treatment: 0 Subjective Chief Complaint Information obtained from Patient 08/26/15; patient is referred for consideration of hyperbaric oxygen treatment secondary to radiation cystitis 03/03/16; patient returns with recurrent hematuria with clots for consideration of a repeat treatment with hyperbarics History of Present Illness (HPI) 08/26/15; this is a patient who is been referred from his urologist Dr. 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 The patient was diagnosed as having adenocarcinoma of the prostate T-2 B N0 M0 Gleason score of 7 in 2013. This was discovered based on a screening PSA of 7.5, the patient was not symptomatic at that time he tells me. He elected to to undergo combination therapy. He received 4500 cGy terminal beam radiation over 5 weeks followed by I/125 seed implantation. Total of 57 seeds were placed with a total mCi dosage of 19.9 mCi. He also received anti androgen therapy. The patient tells me he did quite well and really was asymptomatic until roughly 3-4 weeks ago when he developed acute urinary retention vomiting a trip to the emergency room. Apparently prior to the urinary retention he had passed a few small clots in his urine. He required a catheter placement on July 29. Bladder scan at that time showed greater than 500 cc of urine in his bladder. The catheter was removed however the patient could not void. He has since been doing in and out catheterizations. 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 Erythematous patches were noted within the bladder wall compatible with radiation cystitis. Urinalysis was obtained I don't have these results. The patient was started on Repaflo to help with the retention. He is still having some frank  bleeding this morning according to his wife. He feels as though his spontaneous voiding is improving however he is still doing when necessary in and out caths 09/17/15; the patient is seen today for follow-up visit in conjunction with hyperbaric oxygen treatment for late effect radiation cystitis. He states after starting hyperbarics last week I believe he had what he felt was improved urine flow. However over the weekend he had a problem. Staining on 4:00 he did an in and out catheter for 400 cc of clear urine. 4 hours later he didn't another in and out catheter for grossly bloody urine. Later that evening  he had to void but he could not because of blood clots and ended up in the emergency room where he had a painful set of procedures to put a catheter in place ultimately done by urology. He now has a indwelling Foley catheter, still passing some bloody urine and some blood clots in the leg bag. He was able to show this to me today. He has a follow-up with his urologist on Monday 09/30/15; the patient is seen today in routine follow-up for his ongoing hyperbaric oxygen treatment for late effect radiation cystitis. He has had a difficult time of late being in the ER on 2 occasions on 9/17 with urinary retention. This was in the face of an ongoing Foley catheter. The catheter was irrigated. He saw Dr. Yves Dill his urologist on 9/19. He had a TURP clot evacuation and bladder fulguration most of the bleeding appears to been in the prosthetic urethra although there was several large clots also present in the bladder. There was also several areas of hemorrhagic cystitis along the posterior wall which were fulgurated. He [name], [name] ([id number]) saw Dr. Yves Dill in follow-up yesterday. The Foley catheter was removed. He is able to void roughly 100 cc and then has 400 cc of urine via in and out catheterization. He has had no further bleeding. Feels HBO has made his spontaneous voiding improved 03/03/16; the  patient is referred by Dr. Boneta Lucks his urologist for review review of hemorrhagic radiation cystitis. His history is still essentially the same as above. 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 While being treated with hyperbarics he noted improvement in the hematuria but also the fact that he did not have to do in and out catheters anymore for urinary retention. He presented to Dr. Yves Dill on 02/26/16 with a recent history now of" flash" hematuria and he has passed a few clots. He is not complaining of dysuria hasn't any obstructive voiding symptoms. He is on Elmiron and Rapaflo. Elmiron was recently discontinued Patient History Information obtained from Patient. Allergies no known allergies Family History Cancer - Father, Heart Disease - Siblings, Siblings, Father, Mother, Paternal Grandparents, Maternal Grandparents, Hypertension - Siblings, Siblings, Father, Father, Mother, Paternal Grandparents, Lung Disease - Siblings, Siblings, Stroke - Siblings, No family history of Diabetes, Hereditary Spherocytosis, Kidney Disease, Seizures, Thyroid Problems, Tuberculosis. Social History Never smoker, Marital Status - Married, Alcohol Use - Never, Drug Use - No History, Caffeine Use - Rarely. Medical And Surgical History Notes Genitourinary difficult voiding Review of Systems (ROS) Constitutional Symptoms (General Health) The patient has no complaints or symptoms. Objective Constitutional Patient is hypertensive.. Pulse regular and within target range for patient.Marland Kitchen Respirations regular, non-labored and within target range.. Temperature is normal and within the target range for the patient.. Patient's [name], [name] ([id number]) appearance is neat and clean. Appears in no acute distress. Well nourished and well developed.. Vitals Time Taken: 9:28 AM, Height: 65 in, Source: Stated, Weight: 168 lbs,  Source: Measured, BMI: 28, Temperature: 97.9 F, Pulse: 71 bpm, Respiratory Rate: 18 breaths/min, Blood Pressure: 186/86 mmHg. Respiratory Respiratory effort is easy and symmetric bilaterally. Rate is normal at rest and on room air.. Bilateral breath sounds are clear and equal in all lobes with no wheezes, rales or rhonchi.. Cardiovascular Heart rhythm and rate regular, without murmur or gallop.. Gastrointestinal (GI) Abdomen is soft and non-distended without masses or tenderness. Bowel sounds active in all quadrants.. No liver or spleen enlargement or tenderness.. Assessment Active  Problems ICD-10 N30.41 - Irradiation cystitis with hematuria L59.8 - Other specified disorders of the skin and subcutaneous tissue related to radiation Z85.46 - Personal history of malignant neoplasm of prostate Plan Hyperbaric Oxygen Therapy: Evaluate for HBO Therapy Indication: - soft tissue radionecrosis If appropriate for treatment, begin HBOT per protocol: 2.0 ATA for 90 Minutes without Air Breaks One treatment per day (delivered Monday through Friday unless otherwise specified in Special Instructions below): Total # of Treatments: - 22 [name], [name] ([id number]) the patient is a candidate for rx of recurrent hematuria with HBO He is fearful of a return of the obstructive voiding issues that cause him to have to do I/o catheterizations orders are place pending insurance approval he has had a recent cxr 8/26 and does not have any cardiopulmonary issues Electronic Signature(s) Signed: 03/03/2016 3:56:26 PM By: Linton Ham MD Entered By: Linton Ham on 03/03/2016 12:31:07 [name], [name] ([id number]) -------------------------------------------------------------------------------- ROS/PFSH Details Patient Name: [name] Date of Service: 03/03/2016 9:45 AM Medical Record Number: [id number] Patient Account Number: [id number] Date of Birth/Sex: 1955/02/20 (61 y.o. Male) Treating RN:  Ahmed Prima Primary Care Provider: Annye Asa Other Clinician: Referring Provider: Annye Asa Treating Provider/Extender: Ricard Dillon Weeks in Treatment: 0 Information Obtained From Patient Wound History Do you currently have one or more open woundso No Have you tested positive for osteomyelitis (bone infection)o No Have you had any tests for circulation on your legso No Constitutional Symptoms (General Health) Complaints and Symptoms: No Complaints or Symptoms Eyes Medical History: Negative for: Cataracts; Glaucoma; Optic Neuritis Ear/Nose/Mouth/Throat Medical History: Negative for: Chronic sinus problems/congestion; Middle ear problems Hematologic/Lymphatic Medical History: Negative for: Anemia; Hemophilia; Human Immunodeficiency Virus; Lymphedema; Sickle Cell Disease Respiratory Medical History: Negative for: Aspiration; Asthma; Chronic Obstructive Pulmonary Disease (COPD); Pneumothorax; Sleep Apnea; Tuberculosis Cardiovascular Medical History: Positive for: Hypertension Negative for: Angina; Arrhythmia; Congestive Heart Failure; Coronary Artery Disease; Hypotension; Myocardial Infarction; Peripheral Arterial Disease; Peripheral Venous Disease; Phlebitis; Vasculitis Gastrointestinal [name], [name] ([id number]) Medical History: Negative for: Cirrhosis ; Colitis; Crohnos; Hepatitis A; Hepatitis B; Hepatitis C Endocrine Medical History: Negative for: Type I Diabetes; Type II Diabetes Genitourinary Medical History: Negative for: End Stage Renal Disease Past Medical History Notes: difficult voiding Immunological Medical History: Negative for: Lupus Erythematosus; Raynaudos; Scleroderma Integumentary (Skin) Medical History: Negative for: History of Burn; History of pressure wounds Musculoskeletal Medical History: Negative for: Gout; Rheumatoid Arthritis; Osteoarthritis; Osteomyelitis Neurologic Medical History: Negative for: Dementia;  Neuropathy; Quadriplegia; Paraplegia; Seizure Disorder Oncologic Medical History: Positive for: Received Radiation Psychiatric Medical History: Negative for: Anorexia/bulimia; Confinement Anxiety Immunizations Pneumococcal Vaccine: Received Pneumococcal Vaccination: No Family and Social History Cancer: Yes - Father; Diabetes: No; Heart Disease: Yes - Siblings, Siblings, Father, Mother, Paternal Grandparents, Maternal Grandparents; Hereditary Spherocytosis: No; Hypertension: Yes - Siblings, [name], [name] ([id number]) Siblings, Father, Father, Mother, Paternal Grandparents; Kidney Disease: No; Lung Disease: Yes - Siblings, Siblings; Seizures: No; Stroke: Yes - Siblings; Thyroid Problems: No; Tuberculosis: No; Never smoker; Marital Status - Married; Alcohol Use: Never; Drug Use: No History; Caffeine Use: Rarely; Financial Concerns: No; Food, Clothing or Shelter Needs: No; Support System Lacking: No; Transportation Concerns: No; Advanced Directives: No; Patient does not want information on Advanced Directives; Living Will: No; Medical Power of Attorney: No Electronic Signature(s) Signed: 03/03/2016 3:56:26 PM By: Linton Ham MD Signed: 03/03/2016 4:23:48 PM By: Alric Quan Entered By: Alric Quan on 03/03/2016 09:30:05 [name], [name] ([id number]) -------------------------------------------------------------------------------- [place] Details Patient Name: [name] Date of Service: 03/03/2016 Medical Record Number: [id number] Patient Account Number: [id number] Date of  Birth/Sex: [birth year] (61 y.o. Male) Treating RN: Carolyne Fiscal, Debi Primary Care Provider: Annye Asa Other Clinician: Referring Provider: Annye Asa Treating Provider/Extender: Ricard Dillon Service Line: Outpatient Weeks in Treatment: 0 Diagnosis Coding ICD-10 Codes Code Description N30.41 Irradiation cystitis with hematuria L59.8 Other specified disorders of the skin and  subcutaneous tissue related to radiation Z85.46 Personal history of malignant neoplasm of prostate Facility Procedures CPT4 Code: [id number] Description: [id number] - WOUND CARE VISIT-LEV 2 EST PT Modifier: Quantity: 1 Physician Procedures CPT4: Description Modifier Quantity Code [id number] - WC PHYS LEVEL 4 - EST PT 1 ICD-10 Description Diagnosis N30.41 Irradiation cystitis with hematuria L59.8 Other specified disorders of the skin and subcutaneous tissue related to radiation Electronic Signature(s) Signed: 03/03/2016 3:56:26 PM By: Linton Ham MD Signed: 03/03/2016 4:23:48 PM By: Alric Quan Entered By: Alric Quan on 03/03/2016 12:52:06

## 2016-03-04 NOTE — Progress Notes (Signed)
TILTON, CATA ([id number]) Visit Report for 03/03/2016 Allergy List Details Patient Name: [name], [name] Date of Service: 03/03/2016 9:45 AM Medical Record Number: [id number] Patient Account Number: [id number] Date of Birth/Sex: [birth year] (61 y.o. Male) Treating RN: Ahmed Prima Primary Care Philomina Leon: Annye Asa Other Clinician: Referring Machael Raine: Annye Asa Treating Aamiyah Derrick/Extender: Ricard Dillon Weeks in Treatment: 0 Allergies Active Allergies no known allergies Allergy Notes Electronic Signature(s) Signed: 03/03/2016 4:23:48 PM By: Alric Quan Entered By: Alric Quan on 03/03/2016 09:29:31 [name], [name] ([id number]) -------------------------------------------------------------------------------- [place] Details Patient Name: [name] Date of Service: 03/03/2016 9:45 AM Medical Record Number: [id number] Patient Account Number: [id number] Date of Birth/Sex: 11/30/1955 (61 y.o. Male) Treating RN: Ahmed Prima Primary Care Tambi Thole: Annye Asa Other Clinician: Referring Ashrith Sagan: Annye Asa Treating Sharena Dibenedetto/Extender: Tito Dine in Treatment: 0 Visit Information Patient Arrived: Ambulatory Arrival Time: 09:28 Accompanied By: self Transfer Assistance: None Patient Identification Verified: Yes Secondary Verification Process Yes Completed: Patient Requires Transmission-Based No Precautions: Patient Has Alerts: No History Since Last Visit All ordered tests and consults were completed: No Added or deleted any medications: No Any new allergies or adverse reactions: No Had a fall or experienced change in activities of daily living that may affect risk of falls: No Signs or symptoms of abuse/neglect since last visito No Electronic Signature(s) Signed: 03/03/2016 9:28:48 AM By: Gretta Cool, RN, BSN, Kim RN, BSN Entered By: Gretta Cool, RN, BSN, Kim on 03/03/2016 [id number] [name]  ([id number]) -------------------------------------------------------------------------------- Clinic Level of Care Assessment Details Patient Name: [name] Date of Service: 03/03/2016 9:45 AM Medical Record Number: [id number] Patient Account Number: [id number] Date of Birth/Sex: 07/12/1955 (61 y.o. Male) Treating RN: Carolyne Fiscal, Debi Primary Care Lorely Bubb: Annye Asa Other Clinician: Referring Jennette Leask: Annye Asa Treating Valori Hollenkamp/Extender: Tito Dine in Treatment: 0 Clinic Level of Care Assessment Items TOOL 4 Quantity Score X - Use when only an EandM is performed on FOLLOW-UP visit 1 0 ASSESSMENTS - Nursing Assessment / Reassessment X - Reassessment of Co-morbidities (includes updates in patient status) 1 10 X - Reassessment of Adherence to Treatment Plan 1 5 ASSESSMENTS - Wound and Skin Assessment / Reassessment []  - Simple Wound Assessment / Reassessment - one wound 0 []  - Complex Wound Assessment / Reassessment - multiple wounds 0 []  - Dermatologic / Skin Assessment (not related to wound area) 0 ASSESSMENTS - Focused Assessment []  - Circumferential Edema Measurements - multi extremities 0 []  - Nutritional Assessment / Counseling / Intervention 0 []  - Lower Extremity Assessment (monofilament, tuning fork, pulses) 0 []  - Peripheral Arterial Disease Assessment (using hand held doppler) 0 ASSESSMENTS - Ostomy and/or Continence Assessment and Care []  - Incontinence Assessment and Management 0 []  - Ostomy Care Assessment and Management (repouching, etc.) 0 PROCESS - Coordination of Care []  - Simple Patient / Family Education for ongoing care 0 X - Complex (extensive) Patient / Family Education for ongoing care 1 20 X - Staff obtains Programmer, systems, Records, Test Results / Process Orders 1 10 []  - Staff telephones HHA, Nursing Homes / Clarify orders / etc 0 []  - Routine Transfer to another Facility (non-emergent condition) 0 [name], [name] ([id number]) []   - Routine Hospital Admission (non-emergent condition) 0 X - New Admissions / Biomedical engineer / Ordering NPWT, Apligraf, etc. 1 15 []  - Emergency Hospital Admission (emergent condition) 0 X - Simple Discharge Coordination 1 10 []  - Complex (extensive) Discharge Coordination 0 PROCESS - Special Needs []  - Pediatric / Minor Patient Management 0 []  - Isolation Patient Management 0 []  -  Hearing / Language / Visual special needs 0 []  - Assessment of Community assistance (transportation, D/C planning, etc.) 0 []  - Additional assistance / Altered mentation 0 []  - Support Surface(s) Assessment (bed, cushion, seat, etc.) 0 INTERVENTIONS - Wound Cleansing / Measurement []  - Simple Wound Cleansing - one wound 0 []  - Complex Wound Cleansing - multiple wounds 0 []  - Wound Imaging (photographs - any number of wounds) 0 []  - Wound Tracing (instead of photographs) 0 []  - Simple Wound Measurement - one wound 0 []  - Complex Wound Measurement - multiple wounds 0 INTERVENTIONS - Wound Dressings []  - Small Wound Dressing one or multiple wounds 0 []  - Medium Wound Dressing one or multiple wounds 0 []  - Large Wound Dressing one or multiple wounds 0 []  - Application of Medications - topical 0 []  - Application of Medications - injection 0 INTERVENTIONS - Miscellaneous []  - External ear exam 0 [name], [name] ([id number]) []  - Specimen Collection (cultures, biopsies, blood, body fluids, etc.) 0 []  - Specimen(s) / Culture(s) sent or taken to Lab for analysis 0 []  - Patient Transfer (multiple staff / Civil Service fast streamer / Similar devices) 0 []  - Simple Staple / Suture removal (25 or less) 0 []  - Complex Staple / Suture removal (26 or more) 0 []  - Hypo / Hyperglycemic Management (close monitor of Blood Glucose) 0 []  - Ankle / Brachial Index (ABI) - do not check if billed separately 0 X - Vital Signs 1 5 Has the patient been seen at the hospital within the last three years: Yes Total Score: 75 Level Of Care:  New/Established - Level 2 Electronic Signature(s) Signed: 03/03/2016 4:23:48 PM By: Alric Quan Entered By: Alric Quan on 03/03/2016 12:51:58 [name], [name] ([id number]) -------------------------------------------------------------------------------- Encounter Discharge Information Details Patient Name: [name] Date of Service: 03/03/2016 9:45 AM Medical Record Number: [id number] Patient Account Number: [id number] Date of Birth/Sex: [birth year] (61 y.o. Male) Treating RN: Ahmed Prima Primary Care Moneisha Vosler: Annye Asa Other Clinician: Referring Klynn Linnemann: Annye Asa Treating Darya Bigler/Extender: Tito Dine in Treatment: 0 Encounter Discharge Information Items Discharge Pain Level: 0 Discharge Condition: Stable Ambulatory Status: Ambulatory Discharge Destination: Home Transportation: Private Auto Accompanied By: self Schedule Follow-up Appointment: Yes Medication Reconciliation completed and provided to Patient/Care No Kemyah Buser: Provided on Clinical Summary of Care: 03/03/2016 Form Type Recipient Paper Patient EF Electronic Signature(s) Signed: 03/03/2016 10:07:50 AM By: Ruthine Dose Entered By: Ruthine Dose on 03/03/2016 10:07:49 [name], [name] ([id number]) -------------------------------------------------------------------------------- Lower Extremity Assessment Details Patient Name: [name] Date of Service: 03/03/2016 9:45 AM Medical Record Number: [id number] Patient Account Number: [id number] Date of Birth/Sex: [birth year] (61 y.o. Male) Treating RN: Ahmed Prima Primary Care Messiah Rovira: Annye Asa Other Clinician: Referring Raybon Conard: Annye Asa Treating Nicasio Barlowe/Extender: Ricard Dillon Weeks in Treatment: 0 Electronic Signature(s) Signed: 03/03/2016 4:23:48 PM By: Alric Quan Entered By: Alric Quan on 03/03/2016 09:32:59 [name], [name]  ([id number]) -------------------------------------------------------------------------------- Multi Wound Chart Details Patient Name: [name] Date of Service: 03/03/2016 9:45 AM Medical Record Number: [id number] Patient Account Number: [id number] Date of Birth/Sex: 1955/11/23 (61 y.o. Male) Treating RN: Carolyne Fiscal, Debi Primary Care Tayo Maute: Annye Asa Other Clinician: Referring Winfield Caba: Annye Asa Treating Arch Methot/Extender: Ricard Dillon Weeks in Treatment: 0 Vital Signs Height(in): 65 Pulse(bpm): 71 Weight(lbs): 168 Blood Pressure 186/86 (mmHg): Body Mass Index(BMI): 28 Temperature(F): 97.9 Respiratory Rate 18 (breaths/min): Wound Assessments Treatment Notes Electronic Signature(s) Signed: 03/03/2016 4:23:48 PM By: Alric Quan Entered By: Alric Quan on 03/03/2016 09:48:19 [place] [name], [name] ([id number]) -------------------------------------------------------------------------------- [place] Details Patient Name: [name]  Date of Service: 03/03/2016 9:45 AM Medical Record Number: [id number] Patient Account Number: [id number] Date of Birth/Sex: 1955/01/15 (61 y.o. Male) Treating RN: Ahmed Prima Primary Care Alayjah Boehringer: Annye Asa Other Clinician: Referring Eulamae Greenstein: Annye Asa Treating Evelia Waskey/Extender: Ricard Dillon Weeks in Treatment: 0 Active Inactive Electronic Signature(s) Signed: 03/03/2016 4:23:48 PM By: Alric Quan Entered By: Alric Quan on 03/03/2016 09:47:50 [name], [name] ([id number]) -------------------------------------------------------------------------------- Pain Assessment Details Patient Name: [name] Date of Service: 03/03/2016 9:45 AM Medical Record Number: [id number] Patient Account Number: [id number] Date of Birth/Sex: 11/07/1955 (61 y.o. Male) Treating RN: Ahmed Prima Primary Care Corwyn Vora: Annye Asa Other Clinician: Referring  Terrilynn Postell: Annye Asa Treating Sheryl Towell/Extender: Ricard Dillon Weeks in Treatment: 0 Active Problems Location of Pain Severity and Description of Pain Patient Has Paino No Site Locations Pain Management and Medication Current Pain Management: Electronic Signature(s) Signed: 03/03/2016 4:23:48 PM By: Alric Quan Entered By: Alric Quan on 03/03/2016 09:28:40 [name], [name] ([id number]) -------------------------------------------------------------------------------- Patient/Caregiver Education Details Patient Name: [name] Date of Service: 03/03/2016 9:45 AM Medical Record Number: [id number] Patient Account Number: [id number] Date of Birth/Gender: 08-17-1955 (61 y.o. Male) Treating RN: Ahmed Prima Primary Care Physician: Annye Asa Other Clinician: Referring Physician: Annye Asa Treating Physician/Extender: Tito Dine in Treatment: 0 Education Assessment Education Provided To: Patient Education Topics Provided Hyperbaric Oxygenation: Handouts: Hyperbaric Oxygen Methods: Explain/Verbal Responses: State content correctly Electronic Signature(s) Signed: 03/03/2016 4:23:48 PM By: Alric Quan Entered By: Alric Quan on 03/03/2016 09:49:42 [name], [name] ([id number]) -------------------------------------------------------------------------------- [place] Details Patient Name: [name] Date of Service: 03/03/2016 9:45 AM Medical Record Number: [id number] Patient Account Number: [id number] Date of Birth/Sex: [birth year] (61 y.o. Male) Treating RN: Carolyne Fiscal, Debi Primary Care Dyna Figuereo: Annye Asa Other Clinician: Referring Jordy Verba: Annye Asa Treating Desiderio Dolata/Extender: Tito Dine in Treatment: 0 Vital Signs Time Taken: 09:28 Temperature (F): 97.9 Height (in): 65 Pulse (bpm): 71 Source: Stated Respiratory Rate (breaths/min): 18 Weight (lbs): 168 Blood Pressure (mmHg):  186/86 Source: Measured Reference Range: 80 - 120 mg / dl Body Mass Index (BMI): 28 Electronic Signature(s) Signed: 03/03/2016 4:23:48 PM By: Alric Quan Entered By: Alric Quan on 03/03/2016 09:29:14

## 2016-03-04 NOTE — Progress Notes (Signed)
JULIUS, WINDLAND ([id number]) Visit Report for 03/03/2016 Abuse/Suicide Risk Screen Details Patient Name: [name], [name] Date of Service: 03/03/2016 9:45 AM Medical Record Number: [id number] Patient Account Number: [id number] Date of Birth/Sex: 08/03/1955 (61 y.o. Male) Treating RN: Ahmed Prima Primary Care Beanca Kiester: Annye Asa Other Clinician: Referring Trishelle Devora: Annye Asa Treating Morrell Fluke/Extender: Ricard Dillon Weeks in Treatment: 0 Abuse/Suicide Risk Screen Items Answer ABUSE/SUICIDE RISK SCREEN: Has anyone close to you tried to hurt or harm you recentlyo No Do you feel uncomfortable with anyone in your familyo No Has anyone forced you do things that you didnot want to doo No Do you have any thoughts of harming yourselfo No Patient displays signs or symptoms of abuse and/or neglect. No Electronic Signature(s) Signed: 03/03/2016 4:23:48 PM By: Alric Quan Entered By: Alric Quan on 03/03/2016 09:30:19 [name], [name] ([id number]) -------------------------------------------------------------------------------- Activities of Daily Living Details Patient Name: [name] Date of Service: 03/03/2016 9:45 AM Medical Record Number: [id number] Patient Account Number: [id number] Date of Birth/Sex: 05/06/1955 (61 y.o. Male) Treating RN: Ahmed Prima Primary Care Chanelle Hodsdon: Annye Asa Other Clinician: Referring Jacqulin Brandenburger: Annye Asa Treating Samay Delcarlo/Extender: Ricard Dillon Weeks in Treatment: 0 Activities of Daily Living Items Answer Activities of Daily Living (Please select one for each item) Drive Automobile Completely Able Take Medications Completely Able Use Telephone Completely [place] for Appearance Completely Able Use Toilet Completely Able Bath / Shower Completely Able Dress Self Completely Able Feed Self Completely Able Walk Completely Able Get In / Out Bed Completely [place] for Self Completely Able Electronic Signature(s) Signed: 03/03/2016 4:23:48 PM By: Alric Quan Entered By: Alric Quan on 03/03/2016 09:31:33 [name], [name] ([id number]) -------------------------------------------------------------------------------- Education Assessment Details Patient Name: [name] Date of Service: 03/03/2016 9:45 AM Medical Record Number: [id number] Patient Account Number: [id number] Date of Birth/Sex: 1955/07/20 (61 y.o. Male) Treating RN: Carolyne Fiscal, Debi Primary Care Ashantia Amaral: Annye Asa Other Clinician: Referring Ayza Ripoll: Annye Asa Treating Maynard David/Extender: Tito Dine in Treatment: 0 Primary Learner Assessed: Patient Learning Preferences/Education Level/Primary Language Learning Preference: Explanation, Printed Material Highest Education Level: High School Preferred Language: English Cognitive Barrier Assessment/Beliefs Language Barrier: No Translator Needed: No Memory Deficit: No Emotional Barrier: No Cultural/Religious Beliefs Affecting Medical No Care: Physical Barrier Assessment Impaired Vision: No Impaired Hearing: No Decreased Hand dexterity: No Knowledge/Comprehension Assessment Knowledge Level: Medium Comprehension Level: Medium Ability to understand written Medium instructions: Ability to understand verbal Medium instructions: Motivation Assessment Anxiety Level: Calm Cooperation: Cooperative Education Importance: Acknowledges Need Interest in Health Problems: Asks Questions Perception: Coherent Willingness to Engage in Self- Medium Management Activities: Readiness to Engage in Self- Medium Management Activities: Electronic Signature(s) [name], [name] ([id number]) Signed: 03/03/2016 4:23:48 PM By: Alric Quan Entered By: Alric Quan on 03/03/2016 09:32:11 [name], [name]  ([id number]) -------------------------------------------------------------------------------- Fall Risk Assessment Details Patient Name: [name] Date of Service: 03/03/2016 9:45 AM Medical Record Number: [id number] Patient Account Number: [id number] Date of Birth/Sex: 05-Jan-1955 (61 y.o. Male) Treating RN: Carolyne Fiscal, Debi Primary Care Arnecia Ector: Annye Asa Other Clinician: Referring Lavaya Defreitas: Annye Asa Treating Shedrick Sarli/Extender: Tito Dine in Treatment: 0 Fall Risk Assessment Items Have you had 2 or more falls in the last 12 monthso 0 No Have you had any fall that resulted in injury in the last 12 monthso 0 No FALL RISK ASSESSMENT: History of falling - immediate or within 3 months 0 No Secondary diagnosis 0 No Ambulatory aid None/bed rest/wheelchair/nurse 0 No Crutches/cane/walker 0 No Furniture 0 No IV Access/Saline Lock  0 No Gait/Training Normal/bed rest/immobile 0 No Weak 0 No Impaired 0 No Mental Status Oriented to own ability 0 Yes Electronic Signature(s) Signed: 03/03/2016 4:23:48 PM By: Alric Quan Entered By: Alric Quan on 03/03/2016 09:32:21 [name], [name] ([id number]) -------------------------------------------------------------------------------- Foot Assessment Details Patient Name: [name] Date of Service: 03/03/2016 9:45 AM Medical Record Number: [id number] Patient Account Number: [id number] Date of Birth/Sex: 02-03-1955 (61 y.o. Male) Treating RN: Carolyne Fiscal, Debi Primary Care Eulonda Andalon: Annye Asa Other Clinician: Referring Jozeph Persing: Annye Asa Treating Haakon Titsworth/Extender: Ricard Dillon Weeks in Treatment: 0 Foot Assessment Items Site Locations + = Sensation present, - = Sensation absent, C = Callus, U = Ulcer R = Redness, W = Warmth, M = Maceration, PU = Pre-ulcerative lesion F = Fissure, S = Swelling, D = Dryness Assessment Right: Left: Other Deformity: No No Prior Foot Ulcer: No  No Prior Amputation: No No Charcot Joint: No No Ambulatory Status: Gait: Electronic Signature(s) Signed: 03/03/2016 4:23:48 PM By: Alric Quan Entered By: Alric Quan on 03/03/2016 09:32:47 [name], [name] ([id number]) -------------------------------------------------------------------------------- Nutrition Risk Assessment Details Patient Name: [name] Date of Service: 03/03/2016 9:45 AM Medical Record Number: [id number] Patient Account Number: [id number] Date of Birth/Sex: [birth year] (61 y.o. Male) Treating RN: Carolyne Fiscal, Debi Primary Care Fia Hebert: Annye Asa Other Clinician: Referring [name]: Annye Asa Treating Roma Bierlein/Extender: Ricard Dillon Weeks in Treatment: 0 Height (in): 65 Weight (lbs): 168 Body Mass Index (BMI): 28 Nutrition Risk Assessment Items NUTRITION RISK SCREEN: I have an illness or condition that made me change the kind and/or 0 No amount of food I eat I eat fewer than two meals per day 0 No I eat few fruits and vegetables, or milk products 0 No I have three or more drinks of beer, liquor or wine almost every day 0 No I have tooth or mouth problems that make it hard for me to eat 0 No I don't always have enough money to buy the food I need 0 No I eat alone most of the time 0 No I take three or more different prescribed or over-the-counter drugs a 1 Yes day Without wanting to, I have lost or gained 10 pounds in the last six 0 No months I am not always physically able to shop, cook and/or feed myself 0 No Nutrition Protocols Good Risk Protocol Moderate Risk Protocol Electronic Signature(s) Signed: 03/03/2016 4:23:48 PM By: Alric Quan Entered By: Alric Quan on 03/03/2016 09:32:38

## 2016-03-05 ENCOUNTER — Encounter: Payer: Federal, State, Local not specified - PPO | Admitting: Surgery

## 2016-03-05 DIAGNOSIS — Z8546 Personal history of malignant neoplasm of prostate: Secondary | ICD-10-CM | POA: Diagnosis not present

## 2016-03-05 DIAGNOSIS — N3041 Irradiation cystitis with hematuria: Secondary | ICD-10-CM | POA: Diagnosis not present

## 2016-03-05 DIAGNOSIS — L598 Other specified disorders of the skin and subcutaneous tissue related to radiation: Secondary | ICD-10-CM | POA: Diagnosis not present

## 2016-03-05 NOTE — Progress Notes (Signed)
SLY, MEDOR ([id number]) Visit Report for 03/04/2016 Problem List Details Patient Name: [name], [name] Date of Service: 03/04/2016 8:00 AM Medical Record Number: [id number] Patient Account Number: [id number] Date of Birth/Sex: Nov 20, 1955 (61 y.o. 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.41 Irradiation cystitis with hematuria 03/03/2016 Yes L59.8 Other specified disorders of the skin and subcutaneous 03/03/2016 Yes tissue related to radiation Z85.46 Personal history of malignant neoplasm of prostate 03/03/2016 Yes Inactive Problems Resolved Problems Electronic Signature(s) Signed: 03/04/2016 12:35:37 PM By: Christin Fudge MD, FACS Entered By: Christin Fudge on 03/04/2016 12:35:36 [name], [name] ([id number]) -------------------------------------------------------------------------------- [place] Details Patient Name: [name] Date of Service: 03/04/2016 Medical Record Number: [id number] Patient Account Number: [id number] Date of Birth/Sex: 12/21/1955 (61 y.o. 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 radiation Z85.46 Personal history of malignant neoplasm of prostate Facility Procedures CPT4 Code: [id number] Description: (Facility Use Only) HBOT, full body chamber, 50min Modifier: Quantity: 4 Physician Procedures CPT4: Description Modifier Quantity Code [id number] - WC PHYS HYPERBARIC OXYGEN THERAPY 1  ICD-10 Description Diagnosis 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

## 2016-03-05 NOTE — Progress Notes (Signed)
[name], [name] ([id number]) Visit Report for 03/04/2016 HBO Details Patient Name: [name], [name] Date of Service: 03/04/2016 8:00 AM Medical Record Number: [id number] Patient Account Number: [id number] Date of Birth/Sex: Sep 28, 1955 (61 y.o. 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 HBO Treatment Details Treatment Number: 1 Patient Type: Outpatient Chamber Type: Monoplace Chamber Serial #: [id number] Treatment Protocol: 2.0 ATA with 90 minutes oxygen, and no air breaks Treatment Details Compression Rate Down: 1.5 psi / minute De-Compression Rate Up: 1.5 psi / minute Air breaks and breathing Compress Tx Pressure periods Decompress Decompress Begins Reached (leave unused spaces Begins Ends blank) Chamber Pressure (ATA) 1 2 - - - - - - 2 1 Clock Time (24 hr) 08:15 08:27 - - - - - - 09:57 10:07 Treatment Length: 112 (minutes) Treatment Segments: 4 Capillary Blood Glucose Pre Capillary Blood Glucose (mg/dl): Post Capillary Blood Glucose (mg/dl): Vital Signs Capillary Blood Glucose Reference Range: 80 - 120 mg / dl HBO Diabetic Blood Glucose Intervention Range: <131 mg/dl or >249 mg/dl Time Vitals Blood Respiratory Capillary Blood Glucose Pulse Action Type: Pulse: Temperature: Taken: Pressure: Rate: Glucose (mg/dl): Meter #: Oximetry (%) Taken: Pre 08:05 140/78 72 18 98.2 Post 10:09 160/82 72 18 98.4 Pre-Treatment Ear Evaluation Left Right Odle, Abdel ([id number]) Clear: Yes Clear: Yes Left [name] Scale: Grade 0 Right [name] Scale: Grade 0 Treatment Response Treatment Completion Status: Treatment Completed without  Adverse Event Masae Lukacs Notes examination of the heart and lungs were within normal limits HBO Attestation I certify that I supervised this HBO treatment in accordance with Medicare guidelines. A trained Yes emergency response team is readily available per hospital policies and procedures. Continue HBOT as ordered. Yes Electronic Signature(s) Signed: 03/04/2016 12:36:15 PM By: Christin Fudge MD, FACS Entered By: Christin Fudge on 03/04/2016 12:36:15 [name], [name] ([id number]) -------------------------------------------------------------------------------- HBO Safety Checklist Details Patient Name: [name] Date of Service: 03/04/2016 8:00 AM Medical Record Number: [id number] Patient Account Number: [id number] Date of Birth/Sex: January 14, 1955 (61 y.o. Male) Treating RN: Primary Care Natasha Burda: Annye Asa Other Clinician: Jacqulyn Bath Referring Chantale Leugers: Annye Asa Treating Bryten Maher/Extender: Frann Rider in Treatment: 0 HBO Safety Checklist Items Safety Checklist Consent Form Signed Patient voided / foley secured and emptied When did you last eato 03/03/16 pm Last dose of injectable or oral agent n/a NA Ostomy pouch emptied and vented if applicable NA All implantable devices assessed, documented and approved NA Intravenous access site secured and place Valuables secured Linens and cotton and cotton/polyester blend (less than 51% polyester) Personal oil-based products / skin lotions / body lotions removed NA Wigs or hairpieces removed NA Smoking or tobacco materials removed Books / newspapers / magazines / loose paper removed Cologne, aftershave, perfume and deodorant removed Jewelry removed (may wrap wedding band) NA Make-up removed Hair care products removed Battery operated devices (external) removed Heating patches and chemical warmers removed NA Titanium eyewear removed NA Nail polish cured greater than 10 hours NA Casting material cured greater  than 10 hours NA Hearing aids removed Loose dentures or partials removed NA Prosthetics have been removed Patient demonstrates correct use of air break device (if applicable) Patient concerns have been addressed Patient grounding bracelet on and cord attached to chamber Specifics for Inpatients (complete in addition to above) Medication  sheet sent with patient Intravenous medications needed or due during therapy sent with patient [name], [name] ([id number]) Drainage tubes (e.g. nasogastric tube or chest tube secured and vented) Endotracheal or Tracheotomy tube secured Cuff deflated of air and inflated with saline Airway suctioned Electronic Signature(s) Signed: 03/04/2016 4:18:40 PM By: Lorine Bears RCP, RRT, CHT Entered By: Lorine Bears on 03/04/2016 08:40:09

## 2016-03-05 NOTE — Progress Notes (Signed)
[name], [name] ([id number]) Visit Report for 03/04/2016 Arrival Information Details Patient Name: [name], [name] Date of Service: 03/04/2016 8:00 AM Medical Record Number: [id number] Patient Account Number: [id number] Date of Birth/Sex: Sep 12, 1955 (61 y.o. Male) Treating RN: Primary Care Lakaisha Danish: Annye Asa Other Clinician: Jacqulyn Bath Referring Lorraina Spring: Annye Asa Treating Miryam Mcelhinney/Extender: Frann Rider in Treatment: 0 Visit Information History Since Last Visit Added or deleted any medications: No Patient Arrived: Ambulatory Any new allergies or adverse reactions: No Arrival Time: 07:50 Had a fall or experienced change in No Accompanied By: self activities of daily living that may affect Transfer Assistance: None risk of falls: Patient Identification Verified: Yes Signs or symptoms of abuse/neglect since last No Secondary Verification Process Yes visito Completed: Hospitalized since last visit: No Patient Requires Transmission-Based No Pain Present Now: No Precautions: Patient Has Alerts: No Electronic Signature(s) Signed: 03/04/2016 4:18:40 PM By: Lorine Bears RCP, RRT, CHT Entered By: Lorine Bears on 03/04/2016 08:37:10 [name], [name] ([id number]) -------------------------------------------------------------------------------- Encounter Discharge Information Details Patient Name: [name] Date of Service: 03/04/2016 8:00 AM Medical Record Number: [id number] Patient Account Number: [id number] Date of Birth/Sex: Nov 26, 1955 (61 y.o. Male) Treating RN: Primary Care Jayen Bromwell: Annye Asa Other Clinician: Jacqulyn Bath Referring Jaekwon Mcclune: Annye Asa Treating Nicholson Starace/Extender: Frann Rider in Treatment: 0 Encounter Discharge Information Items Discharge Pain Level: 0 Discharge Condition: Stable Ambulatory Status: Ambulatory Discharge Destination:  Home Private Transportation: Auto Accompanied By: self Schedule Follow-up Appointment: No Medication Reconciliation completed and No provided to Patient/Care Tarisa Paola: Clinical Summary of Care: Notes Patient has an HBO treatment scheduled on 03/05/16 at 08:00 am. Electronic Signature(s) Signed: 03/04/2016 4:18:40 PM By: Lorine Bears RCP, RRT, CHT Entered By: Lorine Bears on 03/04/2016 10:20:26 [name] ([id number]) -------------------------------------------------------------------------------- Vitals Details Patient Name: [name] Date of Service: 03/04/2016 8:00 AM Medical Record Number: [id number] Patient Account Number: [id number] Date of Birth/Sex: [birth year] (61 y.o. Male) Treating RN: Primary Care Neng Albee: Annye Asa Other Clinician: Jacqulyn Bath Referring Kamiyah Kindel: Annye Asa Treating Merrillyn Ackerley/Extender: Frann Rider in Treatment: 0 Vital Signs Time Taken: 08:05 Temperature (F): 98.2 Height (in): 65 Pulse (bpm): 72 Weight (lbs): 168 Respiratory Rate (breaths/min): 18 Body Mass Index (BMI): 28 Blood Pressure (mmHg): 140/78 Reference Range: 80 - 120 mg / dl Electronic Signature(s) Signed: 03/04/2016 4:18:40 PM By: Lorine Bears RCP, RRT, CHT Entered By: Becky Sax, Amado Nash on 03/04/2016 08:38:29

## 2016-03-06 NOTE — Progress Notes (Signed)
[name], [name] ([id number]) Visit Report for 03/05/2016 HBO Details Patient Name: [name], [name] Date of Service: 03/05/2016 8:00 AM Medical Record Number: [id number] Patient Account Number: [id number] Date of Birth/Sex: July 12, 1955 (61 y.o. 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 HBO Treatment Details Treatment Number: 2 Patient Type: Outpatient Chamber Type: Monoplace Chamber Serial #: [id number] Treatment Protocol: 2.0 ATA with 90 minutes oxygen, and no air breaks Treatment Details Compression Rate Down: 1.5 psi / minute De-Compression Rate Up: 1.5 psi / minute Air breaks and breathing Compress Tx Pressure periods Decompress Decompress Begins Reached (leave unused spaces Begins Ends blank) Chamber Pressure (ATA) 1 2 - - - - - - 2 1 Clock Time (24 hr) 08:11 08:23 - - - - - - 09:53 10:03 Treatment Length: 112 (minutes) Treatment Segments: 4 Capillary Blood Glucose Pre Capillary Blood Glucose (mg/dl): Post Capillary Blood Glucose (mg/dl): Vital Signs Capillary Blood Glucose Reference Range: 80 - 120 mg / dl HBO Diabetic Blood Glucose Intervention Range: <131 mg/dl or >249 mg/dl Time Vitals Blood Respiratory Capillary Blood Glucose Pulse Action Type: Pulse: Temperature: Taken: Pressure: Rate: Glucose (mg/dl): Meter #: Oximetry (%) Taken: Pre 08:00 162/94 72 18 98.3 Post 10:05 132/86 72 18 98.6 Treatment Response Treatment Completion Status: Treatment Completed without Adverse Event Shadle, Kamel ([id number]) Electronic Signature(s) Signed: 03/05/2016 3:38:42 PM By: Christin Fudge MD, FACS Signed: 03/05/2016  4:40:25 PM By: Lorine Bears RCP, RRT, CHT Previous Signature: 03/05/2016 10:21:51 AM Version By: Christin Fudge MD, FACS Entered By: Lorine Bears on 03/05/2016 10:29:51 Soroka, Izaias ([id number]) -------------------------------------------------------------------------------- HBO Safety Checklist Details Patient Name: [name] Date of Service: 03/05/2016 8:00 AM Medical Record Number: [id number] Patient Account Number: [id number] Date of Birth/Sex: Feb 17, 1955 (61 y.o. Male) Treating RN: Primary Care Yarelin Reichardt: Annye Asa Other Clinician: Jacqulyn Bath Referring Magdiel Bartles: Annye Asa Treating Alison Kubicki/Extender: Frann Rider in Treatment: 0 HBO Safety Checklist Items Safety Checklist Consent Form Signed Patient voided / foley secured and emptied When did you last eato 03/04/16 pm Last dose of injectable or oral agent n/a NA Ostomy pouch emptied and vented if applicable NA All implantable devices assessed, documented and approved NA Intravenous access site secured and place Valuables secured Linens and cotton and cotton/polyester blend (less than 51% polyester) Personal oil-based products / skin lotions / body lotions removed NA Wigs or hairpieces removed NA Smoking or tobacco materials removed Books / newspapers / magazines / loose paper removed Cologne, aftershave, perfume and deodorant removed Jewelry removed (may wrap wedding band) NA Make-up removed Hair care products removed Battery operated devices (external) removed Heating patches and chemical warmers removed NA Titanium eyewear removed NA Nail polish cured greater than 10 hours NA Casting material cured greater than 10 hours NA Hearing aids removed Loose dentures or partials removed NA Prosthetics have been removed Patient demonstrates correct use of air break device (if applicable) Patient concerns have been addressed Patient grounding bracelet on and cord  attached to chamber Specifics for Inpatients (complete in addition to above) Medication sheet sent with patient Intravenous medications needed or due during therapy sent with patient CARAMANICA, Gabrielle ([id number]) Drainage tubes (e.g. nasogastric tube or chest tube secured and vented) Endotracheal or Tracheotomy tube secured Cuff deflated of air and inflated with saline Airway  suctioned Electronic Signature(s) Signed: 03/05/2016 4:40:25 PM By: Lorine Bears RCP, RRT, CHT Entered By: Lorine Bears on 03/05/2016 08:13:43

## 2016-03-06 NOTE — Progress Notes (Signed)
JEFRI, PEZZA ([id number]) Visit Report for 03/05/2016 Arrival Information Details Patient Name: [name], [name] Date of Service: 03/05/2016 8:00 AM Medical Record Number: [id number] Patient Account Number: [id number] Date of Birth/Sex: 02/02/1955 (62 y.o. Male) Treating RN: Primary Care Collen Vincent: Annye Asa Other Clinician: Jacqulyn Bath Referring Moroni Nester: Annye Asa Treating Thedore Pickel/Extender: Frann Rider in Treatment: 0 Visit Information History Since Last Visit Added or deleted any medications: No Patient Arrived: Ambulatory Any new allergies or adverse reactions: No Arrival Time: 07:50 Had a fall or experienced change in No Accompanied By: self activities of daily living that may affect Transfer Assistance: None risk of falls: Patient Identification Verified: Yes Signs or symptoms of abuse/neglect since last No Secondary Verification Process Yes visito Completed: Hospitalized since last visit: No Patient Requires Transmission-Based No Pain Present Now: No Precautions: Patient Has Alerts: No Electronic Signature(s) Signed: 03/05/2016 4:40:25 PM By: Lorine Bears RCP, RRT, CHT Entered By: Lorine Bears on 03/05/2016 08:12:14 Santoli, Kelen ([id number]) -------------------------------------------------------------------------------- Encounter Discharge Information Details Patient Name: [name] Date of Service: 03/05/2016 8:00 AM Medical Record Number: [id number] Patient Account Number: [id number] Date of Birth/Sex: February 20, 1955 (61 y.o. Male) Treating RN: Primary Care Tamikka Pilger: Annye Asa Other Clinician: Jacqulyn Bath Referring Kazimierz Springborn: Annye Asa Treating Athalene Kolle/Extender: Frann Rider in Treatment: 0 Encounter Discharge Information Items Discharge Pain Level: 0 Discharge Condition: Stable Ambulatory Status: Ambulatory Discharge Destination:  Home Private Transportation: Auto Accompanied By: self Schedule Follow-up Appointment: No Medication Reconciliation completed and No provided to Patient/Care Taia Bramlett: Clinical Summary of Care: Notes Patient has an HBO treatment scheduled on 03/08/16 at 08:00 am. Electronic Signature(s) Signed: 03/05/2016 4:40:25 PM By: Lorine Bears RCP, RRT, CHT Entered By: Lorine Bears on 03/05/2016 10:30:36 Vanmeter, Ryota ([id number]) -------------------------------------------------------------------------------- Vitals Details Patient Name: [name] Date of Service: 03/05/2016 8:00 AM Medical Record Number: [id number] Patient Account Number: [id number] Date of Birth/Sex: [birth year] (61 y.o. Male) Treating RN: Primary Care Edy Mcbane: Annye Asa Other Clinician: Jacqulyn Bath Referring Detavious Rinn: Annye Asa Treating Laira Penninger/Extender: Frann Rider in Treatment: 0 Vital Signs Time Taken: 08:00 Temperature (F): 98.3 Height (in): 65 Pulse (bpm): 72 Weight (lbs): 168 Respiratory Rate (breaths/min): 18 Body Mass Index (BMI): 28 Blood Pressure (mmHg): 162/94 Reference Range: 80 - 120 mg / dl Electronic Signature(s) Signed: 03/05/2016 4:40:25 PM By: Lorine Bears RCP, RRT, CHT Entered By: Lorine Bears on 03/05/2016 08:12:39

## 2016-03-08 ENCOUNTER — Encounter: Payer: Federal, State, Local not specified - PPO | Admitting: Surgery

## 2016-03-08 DIAGNOSIS — L598 Other specified disorders of the skin and subcutaneous tissue related to radiation: Secondary | ICD-10-CM | POA: Diagnosis not present

## 2016-03-08 DIAGNOSIS — N3289 Other specified disorders of bladder: Secondary | ICD-10-CM | POA: Diagnosis not present

## 2016-03-08 DIAGNOSIS — N3041 Irradiation cystitis with hematuria: Secondary | ICD-10-CM | POA: Diagnosis not present

## 2016-03-08 DIAGNOSIS — Z8546 Personal history of malignant neoplasm of prostate: Secondary | ICD-10-CM | POA: Diagnosis not present

## 2016-03-08 NOTE — Progress Notes (Signed)
RYN, SKUBAL ([id number]) Visit Report for 03/08/2016 HBO Details Patient Name: [name], [name] Date of Service: 03/08/2016 8:00 AM Medical Record Number: [id number] Patient Account Number: [id number] Date of Birth/Sex: Dec 21, 1955 (61 y.o. 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 HBO Treatment Details Treatment Number: 3 Patient Type: Outpatient Chamber Type: Monoplace Chamber Serial #: [id number] Treatment Protocol: 2.0 ATA with 90 minutes oxygen, and no air breaks Treatment Details Compression Rate Down: 1.5 psi / minute De-Compression Rate Up: 1.5 psi / minute Air breaks and breathing Compress Tx Pressure periods Decompress Decompress Begins Reached (leave unused spaces Begins Ends blank) Chamber Pressure (ATA) 1 2 - - - - - - 2 1 Clock Time (24 hr) 08:08 08:20 - - - - - - 09:50 10:00 Treatment Length: 112 (minutes) Treatment Segments: 4 Capillary Blood Glucose Pre Capillary Blood Glucose (mg/dl): Post Capillary Blood Glucose (mg/dl): Vital Signs Capillary Blood Glucose Reference Range: 80 - 120 mg / dl HBO Diabetic Blood Glucose Intervention Range: <131 mg/dl or >249 mg/dl Time Vitals Blood Respiratory Capillary Blood Glucose Pulse Action Type: Pulse: Temperature: Taken: Pressure: Rate: Glucose (mg/dl): Meter #: Oximetry (%) Taken: Pre 07:55 140/90 66 18 98.4 Post 10:02 170/88 18 66 98.4 Treatment Response Treatment Completion Status: Treatment Completed without Adverse Event Bitner, Mance ([id number]) Electronic Signature(s) Signed: 03/08/2016 10:29:19 AM By: Lorine Bears RCP, RRT,  CHT Signed: 03/08/2016 2:21:38 PM By: Christin Fudge MD, FACS Previous Signature: 03/08/2016 10:23:55 AM Version By: Christin Fudge MD, FACS Entered By: Lorine Bears on 03/08/2016 10:27:33 Murata, Leighton ([id number]) -------------------------------------------------------------------------------- HBO Safety Checklist Details Patient Name: [name] Date of Service: 03/08/2016 8:00 AM Medical Record Number: [id number] Patient Account Number: [id number] Date of Birth/Sex: [birth year] (61 y.o. Male) Treating RN: Primary Care Mikiyah Glasner: Annye Asa Other Clinician: Jacqulyn Bath Referring Judea Riches: Annye Asa Treating Kamdin Follett/Extender: Frann Rider in Treatment: 0 HBO Safety Checklist Items Safety Checklist Consent Form Signed Patient voided / foley secured and emptied When did you last eato 03/07/16 pm Last dose of injectable or oral agent n/a NA Ostomy pouch emptied and vented if applicable NA All implantable devices assessed, documented and approved NA Intravenous access site secured and place Valuables secured Linens and cotton and cotton/polyester blend (less than 51% polyester) Personal oil-based products / skin lotions / body lotions removed NA Wigs or hairpieces removed NA Smoking or tobacco materials removed Books / newspapers / magazines / loose paper removed Cologne, aftershave, perfume and deodorant removed Jewelry removed (may wrap wedding band) NA Make-up removed Hair care products removed Battery operated devices (external) removed Heating patches and chemical warmers removed NA Titanium eyewear removed NA Nail polish cured greater than 10 hours NA Casting material cured greater than 10 hours NA Hearing aids removed Loose dentures or partials removed NA Prosthetics have been removed Patient demonstrates correct use of air break device (if applicable) Patient concerns have been addressed Patient grounding bracelet on and cord  attached to chamber Specifics for Inpatients (complete in addition to above) Medication sheet sent with patient Intravenous medications needed or due during therapy sent with patient WOLIVER, Johnta ([id number]) Drainage tubes (e.g. nasogastric tube or chest tube secured and vented) Endotracheal or Tracheotomy tube secured Cuff deflated of air and inflated with saline Airway  suctioned Electronic Signature(s) Signed: 03/08/2016 10:29:19 AM By: Lorine Bears RCP, RRT, CHT Entered By: Lorine Bears on 03/08/2016 08:11:01

## 2016-03-08 NOTE — Progress Notes (Signed)
MURL, NAKAZAWA ([id number]) Visit Report for 03/08/2016 Arrival Information Details Patient Name: [name] Date of Service: 03/08/2016 8:00 AM Medical Record Number: [id number] Patient Account Number: [id number] Date of Birth/Sex: 1955-07-30 (61 y.o. Male) Treating RN: Primary Care Ashleyanne Hemmingway: Annye Asa Other Clinician: Jacqulyn Bath Referring Dannika Hilgeman: Annye Asa Treating Madisan Bice/Extender: Frann Rider in Treatment: 0 Visit Information History Since Last Visit Added or deleted any medications: No Patient Arrived: Ambulatory Any new allergies or adverse reactions: No Arrival Time: 07:50 Had a fall or experienced change in No Accompanied By: self activities of daily living that may affect Transfer Assistance: None risk of falls: Patient Identification Verified: Yes Signs or symptoms of abuse/neglect since last No Secondary Verification Process Yes visito Completed: Hospitalized since last visit: No Patient Requires Transmission-Based No Pain Present Now: No Precautions: Patient Has Alerts: No Electronic Signature(s) Signed: 03/08/2016 10:29:19 AM By: Lorine Bears RCP, RRT, CHT Entered By: Lorine Bears on 03/08/2016 08:09:22 [name] ([id number]) -------------------------------------------------------------------------------- Encounter Discharge Information Details Patient Name: [name] Date of Service: 03/08/2016 8:00 AM Medical Record Number: [id number] Patient Account Number: [id number] Date of Birth/Sex: [birth year] (61 y.o. Male) Treating RN: Primary Care Kentrell Guettler: Annye Asa Other Clinician: Jacqulyn Bath Referring Meghana Tullo: Annye Asa Treating Blessings Inglett/Extender: Frann Rider in Treatment: 0 Encounter Discharge Information Items Discharge Pain Level: 0 Discharge Condition: Stable Ambulatory Status: Stable Discharge Destination:  Home Private Transportation: Auto Accompanied By: self Schedule Follow-up Appointment: No Medication Reconciliation completed and No provided to Patient/Care Colin Ellers: Clinical Summary of Care: Notes Patient has an HBO treatment scheduled on 03/09/16 at 08:00 am. Electronic Signature(s) Signed: 03/08/2016 10:29:19 AM By: Lorine Bears RCP, RRT, CHT Entered By: Lorine Bears on 03/08/2016 10:28:37 [name], [name] ([id number]) -------------------------------------------------------------------------------- Vitals Details Patient Name: [name] Date of Service: 03/08/2016 8:00 AM Medical Record Number: [id number] Patient Account Number: [id number] Date of Birth/Sex: Nov 23, 1955 (61 y.o. Male) Treating RN: Primary Care Ellese Julius: Annye Asa Other Clinician: Jacqulyn Bath Referring Zerah Hilyer: Annye Asa Treating Mettie Roylance/Extender: Frann Rider in Treatment: 0 Vital Signs Time Taken: 07:55 Temperature (F): 98.4 Height (in): 65 Pulse (bpm): 66 Weight (lbs): 168 Respiratory Rate (breaths/min): 18 Body Mass Index (BMI): 28 Blood Pressure (mmHg): 140/90 Reference Range: 80 - 120 mg / dl Electronic Signature(s) Signed: 03/08/2016 10:29:19 AM By: Lorine Bears RCP, RRT, CHT Entered By: Lorine Bears on 03/08/2016 08:10:04

## 2016-03-09 ENCOUNTER — Encounter: Payer: Federal, State, Local not specified - PPO | Admitting: Internal Medicine

## 2016-03-09 DIAGNOSIS — L598 Other specified disorders of the skin and subcutaneous tissue related to radiation: Secondary | ICD-10-CM | POA: Diagnosis not present

## 2016-03-09 DIAGNOSIS — N3289 Other specified disorders of bladder: Secondary | ICD-10-CM | POA: Diagnosis not present

## 2016-03-09 DIAGNOSIS — Z8546 Personal history of malignant neoplasm of prostate: Secondary | ICD-10-CM | POA: Diagnosis not present

## 2016-03-09 DIAGNOSIS — N3041 Irradiation cystitis with hematuria: Secondary | ICD-10-CM | POA: Diagnosis not present

## 2016-03-10 ENCOUNTER — Encounter: Payer: Federal, State, Local not specified - PPO | Admitting: Internal Medicine

## 2016-03-10 DIAGNOSIS — L598 Other specified disorders of the skin and subcutaneous tissue related to radiation: Secondary | ICD-10-CM | POA: Diagnosis not present

## 2016-03-10 DIAGNOSIS — Z8546 Personal history of malignant neoplasm of prostate: Secondary | ICD-10-CM | POA: Diagnosis not present

## 2016-03-10 DIAGNOSIS — N3041 Irradiation cystitis with hematuria: Secondary | ICD-10-CM | POA: Diagnosis not present

## 2016-03-10 NOTE — Progress Notes (Signed)
BARACK, NICODEMUS ([id number]) Visit Report for 03/09/2016 HBO Details Patient Name: [name], [name] Date of Service: 03/09/2016 8:00 AM Medical Record Number: [id number] Patient Account Number: [id number] Date of Birth/Sex: 05-Feb-1955 (61 y.o. 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 HBO Treatment Details Treatment Number: 4 Patient Type: Outpatient Chamber Type: Monoplace Chamber Serial #: [id number] Treatment Protocol: 2.0 ATA with 90 minutes oxygen, and no air breaks Treatment Details Compression Rate Down: 1.5 psi / minute De-Compression Rate Up: 1.5 psi / minute Air breaks and breathing Compress Tx Pressure periods Decompress Decompress Begins Reached (leave unused spaces Begins Ends blank) Chamber Pressure (ATA) 1 2 - - - - - - 2 1 Clock Time (24 hr) 08:06 08:17 - - - - - - 09:47 09:58 Treatment Length: 112 (minutes) Treatment Segments: 4 Capillary Blood Glucose Pre Capillary Blood Glucose (mg/dl): Post Capillary Blood Glucose (mg/dl): Vital Signs Capillary Blood Glucose Reference Range: 80 - 120 mg / dl HBO Diabetic Blood Glucose Intervention Range: <131 mg/dl or >249 mg/dl Time Vitals Blood Respiratory Capillary Blood Glucose Pulse Action Type: Pulse: Temperature: Taken: Pressure: Rate: Glucose (mg/dl): Meter #: Oximetry (%) Taken: Pre 07:55 154/82 64 18 98.3 Post 10:01 144/90 76 18 98.5 Treatment Response Well Mounsey, Huey ([id number]) Treatment Toleration: Treatment Treatment Completed without Adverse Event Completion Status: Orlena Garmon Notes No problems with treatment  given HBO Attestation I certify that I supervised this HBO treatment in accordance with Medicare guidelines. A trained Yes emergency response team is readily available per hospital policies and procedures. Continue HBOT as ordered. Yes Electronic Signature(s) Signed: 03/10/2016 8:02:19 AM By: Linton Ham MD Entered By: Linton Ham on 03/09/2016 16:39:15 Kawashima, Dhiren ([id number]) -------------------------------------------------------------------------------- HBO Safety Checklist Details Patient Name: [name] Date of Service: 03/09/2016 8:00 AM Medical Record Number: [id number] Patient Account Number: [id number] Date of Birth/Sex: 1955-07-06 (61 y.o. Male) Treating RN: Montey Hora Primary Care Mickaela Starlin: Annye Asa Other Clinician: Jacqulyn Bath Referring Jovanie Verge: Annye Asa Treating Melisia Leming/Extender: Ricard Dillon Weeks in Treatment: 0 HBO Safety Checklist Items Safety Checklist Consent Form Signed Patient voided / foley secured and emptied When did you last eato 03/08/2016 pm Last dose of injectable or oral agent n/a NA Ostomy pouch emptied and vented if applicable NA All implantable devices assessed, documented and approved NA Intravenous access site secured and place Valuables secured Linens and cotton and cotton/polyester blend (less than 51% polyester) Personal oil-based products / skin lotions / body lotions removed NA Wigs or hairpieces removed NA Smoking or tobacco materials removed Books / newspapers / magazines / loose paper removed Cologne, aftershave, perfume and deodorant removed Jewelry removed (may wrap wedding band) NA Make-up removed Hair care products removed Battery operated devices (external) removed Heating patches and chemical warmers removed NA Titanium eyewear removed NA Nail polish cured greater than 10 hours NA Casting material cured greater than 10 hours NA Hearing aids removed Loose dentures or partials  removed NA Prosthetics have been removed Patient demonstrates correct use of air break device (if applicable) Patient concerns have been addressed Patient grounding bracelet on and cord attached to chamber Specifics for Inpatients (complete in addition to above) Medication sheet sent with patient Intravenous medications needed or due during therapy sent with patient Groll, Masaru (  [id number]) Drainage tubes (e.g. nasogastric tube or chest tube secured and vented) Endotracheal or Tracheotomy tube secured Cuff deflated of air and inflated with saline Airway suctioned Electronic Signature(s) Signed: 03/09/2016 4:54:24 PM By: Montey Hora Entered By: Montey Hora on 03/09/2016 08:27:08

## 2016-03-10 NOTE — Progress Notes (Signed)
[name], [name] ([id number]) Visit Report for 03/09/2016 Arrival Information Details Patient Name: [name], [name] Date of Service: 03/09/2016 8:00 AM Medical Record Number: [id number] Patient Account Number: [id number] Date of Birth/Sex: 1955/03/08 (62 y.o. Male) Treating RN: [name] Primary Care Siddh Vandeventer: Annye Asa Other Clinician: Jacqulyn [name] Referring [name]: Annye Asa Treating Rebekka Lobello/Extender: [name] in Treatment: 0 Visit Information History Since Last Visit Added or deleted any medications: No Patient Arrived: Ambulatory Any new allergies or adverse reactions: No Arrival Time: 07:55 Had a fall or experienced change in No Accompanied By: self activities of daily living that may affect Transfer Assistance: None risk of falls: Patient Identification Verified: Yes Signs or symptoms of abuse/neglect since last No Secondary Verification Process Yes visito Completed: Hospitalized since last visit: No Patient Requires Transmission-Based No Pain Present Now: No Precautions: Patient Has Alerts: No Electronic Signature(s) Signed: 03/09/2016 4:54:24 PM By: [name] Entered By: [name] on 03/09/2016 08:21:35 [name], [name] ([id number]) -------------------------------------------------------------------------------- Encounter Discharge Information Details Patient Name: [name] Date of Service: 03/09/2016 8:00 AM Medical Record Number: [id number] Patient Account Number: [id number] Date of Birth/Sex: Apr 12, 1955 (61 y.o. Male) Treating RN: [name] Primary Care Ziyah Cordoba: Annye Asa Other Clinician: Jacqulyn [name] Referring [name]: Annye Asa Treating [name]/Extender: [name] in Treatment: 0 Encounter Discharge Information Items Discharge Pain Level: 0 Discharge Condition: Stable Ambulatory Status: Ambulatory Discharge Destination: Home Private Transportation: Auto Accompanied  By: self Schedule Follow-up Appointment: Yes Medication Reconciliation completed and No provided to Patient/Care Paden Senger: Clinical Summary of Care: Notes Patient has an HBO treatment scheduled on 03/10/16 at 08:00 am. Electronic Signature(s) Signed: 03/09/2016 4:54:24 PM By: [name] Entered By: [name] on 03/09/2016 10:08:50 [name], [name] ([id number]) -------------------------------------------------------------------------------- [place] Details Patient Name: [name] Date of Service: 03/09/2016 8:00 AM Medical Record Number: [id number] Patient Account Number: [id number] Date of Birth/Sex: Dec 24, 1955 (61 y.o. Male) Treating RN: [name] Primary Care Ruhani Umland: Annye Asa Other Clinician: Jacqulyn [name] Referring Deakon Frix: Annye Asa Treating Valborg Friar/Extender: [name] Weeks in Treatment: 0 Vital Signs Time Taken: 07:55 Temperature (F): 98.3 Height (in): 65 Pulse (bpm): 64 Weight (lbs): 168 Respiratory Rate (breaths/min): 18 Body Mass Index (BMI): 28 Blood Pressure (mmHg): 154/82 Reference Range: 80 - 120 mg / dl Electronic Signature(s) Signed: 03/09/2016 4:54:24 PM By: [name] Entered By: [name] on 03/09/2016 08:22:07

## 2016-03-11 ENCOUNTER — Encounter: Payer: Federal, State, Local not specified - PPO | Admitting: Surgery

## 2016-03-11 DIAGNOSIS — L598 Other specified disorders of the skin and subcutaneous tissue related to radiation: Secondary | ICD-10-CM | POA: Diagnosis not present

## 2016-03-11 DIAGNOSIS — N3289 Other specified disorders of bladder: Secondary | ICD-10-CM | POA: Diagnosis not present

## 2016-03-11 DIAGNOSIS — Z8546 Personal history of malignant neoplasm of prostate: Secondary | ICD-10-CM | POA: Diagnosis not present

## 2016-03-11 DIAGNOSIS — N3041 Irradiation cystitis with hematuria: Secondary | ICD-10-CM | POA: Diagnosis not present

## 2016-03-11 NOTE — Progress Notes (Signed)
JETHRO, RADKE ([id number]) Visit Report for 03/10/2016 Arrival Information Details Patient Name: [name], [name] Date of Service: 03/10/2016 8:00 AM Medical Record Number: [id number] Patient Account Number: [id number] Date of Birth/Sex: 1955-07-30 (61 y.o. Male) Treating RN: Baruch Gouty, RN, BSN, Velva Harman Primary Care Trishelle Devora: Annye Asa Other Clinician: Jacqulyn Bath Referring Neville Pauls: Annye Asa Treating Kemya Shed/Extender: Tito Dine in Treatment: 1 Visit Information History Since Last Visit All ordered tests and consults were completed: No Patient Arrived: Ambulatory Added or deleted any medications: No Arrival Time: 07:50 Any new allergies or adverse reactions: No Accompanied By: self Had a fall or experienced change in No Transfer Assistance: None activities of daily living that may affect Patient Identification Verified: Yes risk of falls: Secondary Verification Process Yes Signs or symptoms of abuse/neglect since last No Completed: visito Patient Requires Transmission-Based No Hospitalized since last visit: No Precautions: Pain Present Now: No Patient Has Alerts: No Electronic Signature(s) Signed: 03/10/2016 5:24:45 PM By: Regan Lemming BSN, RN Entered By: Regan Lemming on 03/10/2016 Wing, Torrance ([id number]) -------------------------------------------------------------------------------- [place] Details Patient Name: [name] Date of Service: 03/10/2016 8:00 AM Medical Record Number: [id number] Patient Account Number: [id number] Date of Birth/Sex: Jun 16, 1955 (61 y.o. Male) Treating RN: Afful, RN, BSN, [place] Primary Care Riese Hellard: Annye Asa Other Clinician: Jacqulyn Bath Referring Suraj Ramdass: Annye Asa Treating Brailen Macneal/Extender: Ricard Dillon Weeks in Treatment: 1 Vital Signs Time Taken: 07:51 Temperature (F): 98.4 Height (in): 65 Pulse (bpm): 78 Weight (lbs): 168 Respiratory Rate (breaths/min): 18 Body Mass  Index (BMI): 28 Blood Pressure (mmHg): 156/90 Reference Range: 80 - 120 mg / dl Electronic Signature(s) Signed: 03/10/2016 5:24:45 PM By: Regan Lemming BSN, RN Entered By: Regan Lemming on 03/10/2016 08:17:37

## 2016-03-12 ENCOUNTER — Encounter: Payer: Federal, State, Local not specified - PPO | Admitting: Surgery

## 2016-03-12 DIAGNOSIS — N3041 Irradiation cystitis with hematuria: Secondary | ICD-10-CM | POA: Diagnosis not present

## 2016-03-12 DIAGNOSIS — Z8546 Personal history of malignant neoplasm of prostate: Secondary | ICD-10-CM | POA: Diagnosis not present

## 2016-03-12 DIAGNOSIS — L598 Other specified disorders of the skin and subcutaneous tissue related to radiation: Secondary | ICD-10-CM | POA: Diagnosis not present

## 2016-03-12 DIAGNOSIS — N3289 Other specified disorders of bladder: Secondary | ICD-10-CM | POA: Diagnosis not present

## 2016-03-12 NOTE — Progress Notes (Signed)
YOSHIMI, SARR ([id number]) Visit Report for 03/11/2016 Arrival Information Details Patient Name: EZRAH, PANNING Date of Service: 03/11/2016 8:00 AM Medical Record Number: [id number] Patient Account Number: [id number] Date of Birth/Sex: [birth year] (61 y.o. Male) Treating RN: Primary Care Dekayla Prestridge: Annye Asa Other Clinician: Jacqulyn Bath Referring Piper Hassebrock: Annye Asa Treating Cleotha Whalin/Extender: Frann Rider in Treatment: 1 Visit Information History Since Last Visit Added or deleted any medications: No Patient Arrived: Ambulatory Any new allergies or adverse reactions: No Arrival Time: 07:50 Had a fall or experienced change in No Accompanied By: self activities of daily living that may affect Transfer Assistance: None risk of falls: Patient Identification Verified: Yes Signs or symptoms of abuse/neglect since last No Secondary Verification Process Yes visito Completed: Hospitalized since last visit: No Patient Requires Transmission-Based No Pain Present Now: No Precautions: Patient Has Alerts: No Electronic Signature(s) Signed: 03/11/2016 10:41:31 AM By: Lorine Bears RCP, RRT, CHT Entered By: Lorine Bears on 03/11/2016 07:53:30 Whelpley, Rui ([id number]) -------------------------------------------------------------------------------- Encounter Discharge Information Details Patient Name: [name] Date of Service: 03/11/2016 8:00 AM Medical Record Number: [id number] Patient Account Number: [id number] Date of Birth/Sex: Nov 22, 1955 (61 y.o. Male) Treating RN: Primary Care Abad Manard: Annye Asa Other Clinician: Jacqulyn Bath Referring Ayriana Wix: Annye Asa Treating Alta Goding/Extender: Frann Rider in Treatment: 1 Encounter Discharge Information Items Discharge Pain Level: 0 Discharge Condition: Stable Ambulatory Status: Ambulatory Discharge Destination:  Home Private Transportation: Auto Accompanied By: self Schedule Follow-up Appointment: No Medication Reconciliation completed and No provided to Patient/Care Keenan Dimitrov: Clinical Summary of Care: Notes Patient has an HBO treatment scheduled on 03/12/16 at 08:00 am. Electronic Signature(s) Signed: 03/11/2016 10:41:31 AM By: Lorine Bears RCP, RRT, CHT Entered By: Lorine Bears on 03/11/2016 10:41:13 Pipkins, Dimarco ([id number]) -------------------------------------------------------------------------------- [place] Details Patient Name: [name] Date of Service: 03/11/2016 8:00 AM Medical Record Number: [id number] Patient Account Number: [id number] Date of Birth/Sex: 1955-09-25 (61 y.o. Male) Treating RN: Primary Care Annice Jolly: Annye Asa Other Clinician: Jacqulyn Bath Referring Lacrystal Barbe: Annye Asa Treating Demarrio Menges/Extender: Frann Rider in Treatment: 1 Vital Signs Time Taken: 07:57 Temperature (F): 98.0 Height (in): 65 Pulse (bpm): 66 Weight (lbs): 168 Respiratory Rate (breaths/min): 16 Body Mass Index (BMI): 28 Blood Pressure (mmHg): 170/84 Reference Range: 80 - 120 mg / dl Electronic Signature(s) Signed: 03/11/2016 10:41:31 AM By: Lorine Bears RCP, RRT, CHT Entered By: Lorine Bears on 03/11/2016 08:04:00

## 2016-03-12 NOTE — Progress Notes (Signed)
[name] ([id number]) Visit Report for 03/10/2016 HBO Details Patient Name: [name], [name] Date of Service: 03/10/2016 8:00 AM Medical Record Number: [id number] Patient Account Number: [id number] Date of Birth/Sex: 1955/07/08 (61 y.o. 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 HBO Treatment Details Treatment Number: 5 Patient Type: Outpatient Chamber Type: Monoplace Chamber Serial #: [id number] Treatment Protocol: 2.0 ATA with 90 minutes oxygen, and no air breaks Treatment Details Compression Rate Down: 1.5 psi / minute De-Compression Rate Up: 1.5 psi / minute Air breaks and breathing Compress Tx Pressure periods Decompress Decompress Begins Reached (leave unused spaces Begins Ends blank) Chamber Pressure (ATA) 1 2 - - - - - - 2 1 Clock Time (24 hr) 08:11 08:21 - - - - - - 09:50 10:01 Treatment Length: 110 (minutes) Treatment Segments: 4 Capillary Blood Glucose Pre Capillary Blood Glucose (mg/dl): Post Capillary Blood Glucose (mg/dl): Vital Signs Capillary Blood Glucose Reference Range: 80 - 120 mg / dl HBO Diabetic Blood Glucose Intervention Range: <131 mg/dl or >249 mg/dl Time Vitals Blood Respiratory Capillary Blood Glucose Pulse Action Type: Pulse: Temperature: Taken: Pressure: Rate: Glucose (mg/dl): Meter #: Oximetry (%) Taken: Pre 07:51 156/90 78 18 98.4 Post 10:05 180/90 80 16 98.2 Treatment Response Well Butcher, Masiyah ([id number]) Treatment Toleration: Treatment Treatment Completed without Adverse Event Completion Status: HBO Attestation I certify that I supervised  this HBO treatment in accordance with Medicare guidelines. A trained Yes emergency response team is readily available per hospital policies and procedures. Continue HBOT as ordered. Yes Electronic Signature(s) Signed: 03/10/2016 5:24:45 PM By: Regan Lemming BSN, RN Signed: 03/11/2016 5:01:50 PM By: Linton Ham MD Entered By: Regan Lemming on 03/10/2016 10:12:17 Araque, Ibrahima ([id number]) -------------------------------------------------------------------------------- HBO Safety Checklist Details Patient Name: [name] Date of Service: 03/10/2016 8:00 AM Medical Record Number: [id number] Patient Account Number: [id number] Date of Birth/Sex: 06-Feb-1955 (61 y.o. Male) Treating RN: Baruch Gouty, RN, BSN, [place] Primary Care Bernell Sigal: Annye Asa Other Clinician: Jacqulyn Bath Referring Daruis Swaim: Annye Asa Treating Izza Bickle/Extender: Tito Dine in Treatment: 1 HBO Safety Checklist Items Safety Checklist Consent Form Signed Patient voided / foley secured and emptied When did you last eato 03/09/16 Last dose of injectable or oral agent N/A NA Ostomy pouch emptied and vented if applicable NA All implantable devices assessed, documented and approved NA Intravenous access site secured and place Valuables secured Linens and cotton and cotton/polyester blend (less than 51% polyester) Personal oil-based products / skin lotions / body lotions removed NA Wigs or hairpieces removed NA Smoking or tobacco materials removed Books / newspapers / magazines / loose paper removed Cologne, aftershave, perfume and deodorant removed Jewelry removed (may wrap wedding band) NA Make-up removed Hair care products removed NA Battery operated devices (external) removed NA Heating patches and chemical warmers removed NA Titanium eyewear removed NA Nail polish cured greater than 10 hours NA Casting material cured greater than 10 hours NA Hearing aids removed Loose dentures or  partials removed NA Prosthetics have been removed Patient demonstrates correct use of air break device (if applicable) Patient concerns have been addressed Patient grounding bracelet on and cord attached to chamber Specifics for Inpatients (complete in addition to above) Medication sheet sent with patient Intravenous medications needed or due  during therapy sent with patient VU, LIEBMAN ([id number]) Drainage tubes (e.g. nasogastric tube or chest tube secured and vented) Endotracheal or Tracheotomy tube secured Cuff deflated of air and inflated with saline Airway suctioned Electronic Signature(s) Signed: 03/10/2016 5:24:45 PM By: Regan Lemming BSN, RN Entered By: Regan Lemming on 03/10/2016 08:19:40

## 2016-03-12 NOTE — Progress Notes (Signed)
VIRAAT, VANPATTEN ([id number]) Visit Report for 03/11/2016 HBO Details Patient Name: [name], [name] Date of Service: 03/11/2016 8:00 AM Medical Record Number: [id number] Patient Account Number: [id number] Date of Birth/Sex: 1955-01-31 (61 y.o. 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 HBO Treatment Details Treatment Number: 6 Patient Type: Outpatient Chamber Type: Monoplace Chamber Serial #: [id number] Treatment Protocol: 2.0 ATA with 90 minutes oxygen, and no air breaks Treatment Details Compression Rate Down: 1.5 psi / minute De-Compression Rate Up: 1.5 psi / minute Air breaks and breathing Compress Tx Pressure periods Decompress Decompress Begins Reached (leave unused spaces Begins Ends blank) Chamber Pressure (ATA) 1 2 - - - - - - 2 1 Clock Time (24 hr) 08:03 - - - - - - - 09:45 09:55 Treatment Length: 112 (minutes) Treatment Segments: 4 Capillary Blood Glucose Pre Capillary Blood Glucose (mg/dl): Post Capillary Blood Glucose (mg/dl): Vital Signs Capillary Blood Glucose Reference Range: 80 - 120 mg / dl HBO Diabetic Blood Glucose Intervention Range: <131 mg/dl or >249 mg/dl Time Vitals Blood Respiratory Capillary Blood Glucose Pulse Action Type: Pulse: Temperature: Taken: Pressure: Rate: Glucose (mg/dl): Meter #: Oximetry (%) Taken: Pre 07:57 170/84 66 16 98 Post 09:56 166/84 66 16 98.1 Treatment Response Treatment Completion Status: Treatment Completed without Adverse Event Baig, Christy ([id number]) Electronic Signature(s) Signed: 03/11/2016 10:41:31 AM By: Lorine Bears RCP, RRT, CHT Signed:  03/11/2016 4:19:30 PM By: Christin Fudge MD, FACS Previous Signature: 03/11/2016 9:59:11 AM Version By: Christin Fudge MD, FACS Entered By: Lorine Bears on 03/11/2016 10:40:10 Trentman, Kolter ([id number]) -------------------------------------------------------------------------------- HBO Safety Checklist Details Patient Name: [name] Date of Service: 03/11/2016 8:00 AM Medical Record Number: [id number] Patient Account Number: [id number] Date of Birth/Sex: 07-18-1955 (61 y.o. Male) Treating RN: Primary Care Kagen Kunath: Annye Asa Other Clinician: Jacqulyn Bath Referring Dakotah Orrego: Annye Asa Treating Latreece Mochizuki/Extender: Frann Rider in Treatment: 1 HBO Safety Checklist Items Safety Checklist Consent Form Signed Patient voided / foley secured and emptied When did you last eato 03/10/16 pm Last dose of injectable or oral agent n/a NA Ostomy pouch emptied and vented if applicable NA All implantable devices assessed, documented and approved NA Intravenous access site secured and place Valuables secured Linens and cotton and cotton/polyester blend (less than 51% polyester) Personal oil-based products / skin lotions / body lotions removed NA Wigs or hairpieces removed NA Smoking or tobacco materials removed Books / newspapers / magazines / loose paper removed Cologne, aftershave, perfume and deodorant removed Jewelry removed (may wrap wedding band) NA Make-up removed Hair care products removed Battery operated devices (external) removed Heating patches and chemical warmers removed NA Titanium eyewear removed NA Nail polish cured greater than 10 hours NA Casting material cured greater than 10 hours NA Hearing aids removed Loose dentures or partials removed NA Prosthetics have been removed Patient demonstrates correct use of air break device (if applicable) Patient concerns have been addressed Patient grounding bracelet on and cord attached to  chamber Specifics for Inpatients (complete in addition to above) Medication sheet sent with patient Intravenous medications needed or due during therapy sent with patient EVELETH, Kendyn ([id number]) Drainage tubes (e.g. nasogastric tube or chest tube secured and vented) Endotracheal or Tracheotomy tube secured Cuff deflated of air and inflated with saline Airway  suctioned Electronic Signature(s) Signed: 03/11/2016 10:41:31 AM By: Lorine Bears RCP, RRT, CHT Entered By: Lorine Bears on 03/11/2016 08:04:49

## 2016-03-13 NOTE — Progress Notes (Signed)
RISHARD, DELANGE ([id number]) Visit Report for 03/12/2016 Arrival Information Details Patient Name: AXLE, PARFAIT Date of Service: 03/12/2016 8:00 AM Medical Record Number: [id number] Patient Account Number: [id number] Date of Birth/Sex: 03/21/1955 (61 y.o. Male) Treating RN: Primary Care Tiffeny Minchew: Annye Asa Other Clinician: Jacqulyn Bath Referring Meridee Branum: Annye Asa Treating Neaveh Belanger/Extender: Frann Rider in Treatment: 1 Visit Information History Since Last Visit Added or deleted any medications: No Patient Arrived: Ambulatory Any new allergies or adverse reactions: No Arrival Time: 07:55 Had a fall or experienced change in No Accompanied By: self activities of daily living that may affect Transfer Assistance: None risk of falls: Patient Identification Verified: Yes Signs or symptoms of abuse/neglect since last No Secondary Verification Process Yes visito Completed: Hospitalized since last visit: No Patient Requires Transmission-Based No Pain Present Now: No Precautions: Patient Has Alerts: No Electronic Signature(s) Signed: 03/12/2016 10:12:43 AM By: Lorine Bears RCP, RRT, CHT Entered By: Lorine Bears on 03/12/2016 07:58:42 Korpela, Cace ([id number]) -------------------------------------------------------------------------------- Encounter Discharge Information Details Patient Name: [name] Date of Service: 03/12/2016 8:00 AM Medical Record Number: [id number] Patient Account Number: [id number] Date of Birth/Sex: 02-Oct-1955 (61 y.o. Male) Treating RN: Primary Care Alwaleed Obeso: Annye Asa Other Clinician: Jacqulyn Bath Referring Lela Murfin: Annye Asa Treating Kidus Delman/Extender: Frann Rider in Treatment: 1 Encounter Discharge Information Items Discharge Pain Level: 0 Discharge Condition: Stable Ambulatory Status: Ambulatory Discharge Destination:  Home Private Transportation: Auto Accompanied By: self Schedule Follow-up Appointment: No Medication Reconciliation completed and No provided to Patient/Care Selenne Coggin: Clinical Summary of Care: Notes Patient has an HBO treatment scheduled on 03/15/16 at 08:00 am. Electronic Signature(s) Signed: 03/12/2016 10:12:43 AM By: Lorine Bears RCP, RRT, CHT Entered By: Lorine Bears on 03/12/2016 10:12:25 Nudo, Marvis ([id number]) -------------------------------------------------------------------------------- Vitals Details Patient Name: [name] Date of Service: 03/12/2016 8:00 AM Medical Record Number: [id number] Patient Account Number: [id number] Date of Birth/Sex: Dec 14, 1955 (61 y.o. Male) Treating RN: Primary Care Larri Brewton: Annye Asa Other Clinician: Jacqulyn Bath Referring Dejanee Thibeaux: Annye Asa Treating Toneka Fullen/Extender: Frann Rider in Treatment: 1 Vital Signs Time Taken: 07:55 Temperature (F): 98.3 Height (in): 65 Pulse (bpm): 78 Weight (lbs): 168 Respiratory Rate (breaths/min): 16 Body Mass Index (BMI): 28 Blood Pressure (mmHg): 132/84 Reference Range: 80 - 120 mg / dl Electronic Signature(s) Signed: 03/12/2016 10:12:43 AM By: Lorine Bears RCP, RRT, CHT Entered By: Becky Sax, Amado Nash on 03/12/2016 07:58:59

## 2016-03-13 NOTE — Progress Notes (Signed)
[name], [name] ([id number]) Visit Report for 03/12/2016 HBO Details Patient Name: [name], [name] Date of Service: 03/12/2016 8:00 AM Medical Record Number: [id number] Patient Account Number: [id number] Date of Birth/Sex: 1955/01/25 (61 y.o. 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 HBO Treatment Details Treatment Number: 7 Patient Type: Outpatient Chamber Type: Monoplace Chamber Serial #: [id number] Treatment Protocol: 2.0 ATA with 90 minutes oxygen, and no air breaks Treatment Details Compression Rate Down: 1.5 psi / minute De-Compression Rate Up: 1.5 psi / minute Air breaks and breathing Compress Tx Pressure periods Decompress Decompress Begins Reached (leave unused spaces Begins Ends blank) Chamber Pressure (ATA) 1 2 - - - - - - 2 1 Clock Time (24 hr) 08:05 08:17 - - - - - - 09:47 09:57 Treatment Length: 112 (minutes) Treatment Segments: 4 Capillary Blood Glucose Pre Capillary Blood Glucose (mg/dl): Post Capillary Blood Glucose (mg/dl): Vital Signs Capillary Blood Glucose Reference Range: 80 - 120 mg / dl HBO Diabetic Blood Glucose Intervention Range: <131 mg/dl or >249 mg/dl Time Vitals Blood Respiratory Capillary Blood Glucose Pulse Action Type: Pulse: Temperature: Taken: Pressure: Rate: Glucose (mg/dl): Meter #: Oximetry (%) Taken: Pre 07:55 132/84 78 16 98.3 Post 09:58 148/96 60 16 98.1 Treatment Response Treatment Completion Status: Treatment Completed without Adverse Event [name], [name] ([id number]) HBO Attestation I certify that I supervised this HBO treatment in accordance with Medicare  guidelines. A trained Yes emergency response team is readily available per hospital policies and procedures. Continue HBOT as ordered. Yes Electronic Signature(s) Signed: 03/12/2016 11:50:19 AM By: Christin Fudge MD, FACS Previous Signature: 03/12/2016 10:12:43 AM Version By: Lorine Bears RCP, RRT, CHT Entered By: Christin Fudge on 03/12/2016 11:50:19 [name], [name] ([id number]) -------------------------------------------------------------------------------- HBO Safety Checklist Details Patient Name: [name] Date of Service: 03/12/2016 8:00 AM Medical Record Number: [id number] Patient Account Number: [id number] Date of Birth/Sex: 1955-06-22 (61 y.o. Male) Treating RN: Primary Care Keyarah Mcroy: Annye Asa Other Clinician: Jacqulyn Bath Referring Javel Hersh: Annye Asa Treating Daeshaun Specht/Extender: Frann Rider in Treatment: 1 HBO Safety Checklist Items Safety Checklist Consent Form Signed Patient voided / foley secured and emptied When did you last eato 03/11/16 pm Last dose of injectable or oral agent n/a NA Ostomy pouch emptied and vented if applicable NA All implantable devices assessed, documented and approved NA Intravenous access site secured and place Valuables secured Linens and cotton and cotton/polyester blend (less than 51% polyester) Personal oil-based products / skin lotions / body lotions removed NA Wigs or hairpieces removed NA Smoking or tobacco materials removed Books / newspapers / magazines / loose paper removed Cologne, aftershave, perfume and deodorant removed Jewelry removed (may wrap wedding band) NA Make-up removed Hair care products removed Battery operated devices (external) removed Heating patches and chemical warmers removed NA Titanium eyewear removed NA Nail polish cured greater than 10 hours NA Casting material cured greater than 10 hours NA Hearing aids removed Loose dentures or partials removed NA Prosthetics  have been removed Patient demonstrates correct use of air break device (if applicable) Patient concerns have been addressed Patient grounding bracelet on and cord attached to chamber Specifics for Inpatients (complete in addition to above) Medication sheet sent with patient Intravenous medications needed or due during therapy sent with patient [name], [name] ([id number]) Drainage  tubes (e.g. nasogastric tube or chest tube secured and vented) Endotracheal or Tracheotomy tube secured Cuff deflated of air and inflated with saline Airway suctioned Electronic Signature(s) Signed: 03/12/2016 10:12:43 AM By: Lorine Bears RCP, RRT, CHT Entered By: Lorine Bears on 03/12/2016 08:06:46

## 2016-03-15 ENCOUNTER — Encounter: Payer: Federal, State, Local not specified - PPO | Admitting: Surgery

## 2016-03-15 DIAGNOSIS — L598 Other specified disorders of the skin and subcutaneous tissue related to radiation: Secondary | ICD-10-CM | POA: Diagnosis not present

## 2016-03-15 DIAGNOSIS — N3289 Other specified disorders of bladder: Secondary | ICD-10-CM | POA: Diagnosis not present

## 2016-03-15 DIAGNOSIS — N3041 Irradiation cystitis with hematuria: Secondary | ICD-10-CM | POA: Diagnosis not present

## 2016-03-15 DIAGNOSIS — Z8546 Personal history of malignant neoplasm of prostate: Secondary | ICD-10-CM | POA: Diagnosis not present

## 2016-03-15 NOTE — Progress Notes (Signed)
[name] ([id number]) Visit Report for 03/15/2016 HBO Details Patient Name: [name], [name] Date of Service: 03/15/2016 8:00 AM Medical Record Number: [id number] Patient Account Number: [id number] Date of Birth/Sex: 11/04/1955 (61 y.o. 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 HBO Treatment Details Treatment Number: 8 Patient Type: Outpatient Chamber Type: Monoplace Chamber Serial #: [id number] Treatment Protocol: 2.0 ATA with 90 minutes oxygen, and no air breaks Treatment Details Compression Rate Down: 1.5 psi / minute De-Compression Rate Up: 1.5 psi / minute Air breaks and breathing Compress Tx Pressure periods Decompress Decompress Begins Reached (leave unused spaces Begins Ends blank) Chamber Pressure (ATA) 1 2 - - - - - - 2 1 Clock Time (24 hr) 08:09 08:20 - - - - - - 09:50 10:00 Treatment Length: 111 (minutes) Treatment Segments: 4 Capillary Blood Glucose Pre Capillary Blood Glucose (mg/dl): Post Capillary Blood Glucose (mg/dl): Vital Signs Capillary Blood Glucose Reference Range: 80 - 120 mg / dl HBO Diabetic Blood Glucose Intervention Range: <131 mg/dl or >249 mg/dl Time Vitals Blood Respiratory Capillary Blood Glucose Pulse Action Type: Pulse: Temperature: Taken: Pressure: Rate: Glucose (mg/dl): Meter #: Oximetry (%) Taken: Pre 07:55 148/100 78 16 98.5 Post 10:00 76/100 78 16 97.9 Treatment Response Treatment Completion Status: Treatment Completed without Adverse Event [name], [name] ([id number]) HBO Attestation I certify that I supervised this HBO treatment in accordance with Medicare  guidelines. A trained Yes emergency response team is readily available per hospital policies and procedures. Continue HBOT as ordered. Yes Electronic Signature(s) Signed: 03/15/2016 10:48:15 AM By: Christin Fudge MD, FACS Previous Signature: 03/15/2016 10:24:43 AM Version By: Lorine Bears RCP, RRT, CHT Entered By: Christin Fudge on 03/15/2016 10:48:15 [name], [name] ([id number]) -------------------------------------------------------------------------------- HBO Safety Checklist Details Patient Name: [name] Date of Service: 03/15/2016 8:00 AM Medical Record Number: [id number] Patient Account Number: [id number] Date of Birth/Sex: 1955/02/02 (61 y.o. Male) Treating RN: Primary Care Karlisa Gaubert: Annye Asa Other Clinician: Jacqulyn Bath Referring Jeanna Giuffre: Annye Asa Treating Felix Meras/Extender: Frann Rider in Treatment: 1 HBO Safety Checklist Items Safety Checklist Consent Form Signed Patient voided / foley secured and emptied When did you last eato 03/14/16 Last dose of injectable or oral agent n/a NA Ostomy pouch emptied and vented if applicable NA All implantable devices assessed, documented and approved NA Intravenous access site secured and place Valuables secured Linens and cotton and cotton/polyester blend (less than 51% polyester) Personal oil-based products / skin lotions / body lotions removed NA Wigs or hairpieces removed NA Smoking or tobacco materials removed Books / newspapers / magazines / loose paper removed Cologne, aftershave, perfume and deodorant removed Jewelry removed (may wrap wedding band) NA Make-up removed Hair care products removed Battery operated devices (external) removed Heating patches and chemical warmers removed NA Titanium eyewear removed NA Nail polish cured greater than 10 hours NA Casting material cured greater than 10 hours NA Hearing aids removed Loose dentures or partials removed NA  Prosthetics have been removed Patient demonstrates correct use of air break device (if applicable) Patient concerns have been addressed Patient grounding bracelet on and cord attached to chamber Specifics for Inpatients (complete in addition to above) Medication sheet sent with patient Intravenous medications needed or due during therapy sent with patient [name], [name] ([id number]) Drainage tubes (  e.g. nasogastric tube or chest tube secured and vented) Endotracheal or Tracheotomy tube secured Cuff deflated of air and inflated with saline Airway suctioned Electronic Signature(s) Signed: 03/15/2016 10:24:43 AM By: Lorine Bears RCP, RRT, CHT Entered By: Becky Sax, Amado Nash on 03/15/2016 08:41:18

## 2016-03-15 NOTE — Progress Notes (Signed)
[name], [name] ([id number]) Visit Report for 03/15/2016 Arrival Information Details Patient Name: [name], [name] Date of Service: 03/15/2016 8:00 AM Medical Record Number: [id number] Patient Account Number: [id number] Date of Birth/Sex: 01-13-1955 (61 y.o. Male) Treating RN: Primary Care Katarina Riebe: Annye Asa Other Clinician: Jacqulyn Bath Referring Marico Buckle: Annye Asa Treating Daveena Elmore/Extender: Frann Rider in Treatment: 1 Visit Information History Since Last Visit Added or deleted any medications: No Patient Arrived: Ambulatory Any new allergies or adverse reactions: No Arrival Time: 07:53 Had a fall or experienced change in No Accompanied By: self activities of daily living that may affect Transfer Assistance: None risk of falls: Patient Identification Verified: Yes Signs or symptoms of abuse/neglect since last No Secondary Verification Process Yes visito Completed: Hospitalized since last visit: No Patient Requires Transmission-Based No Pain Present Now: No Precautions: Patient Has Alerts: No Electronic Signature(s) Signed: 03/15/2016 10:24:43 AM By: Lorine Bears RCP, RRT, CHT Entered By: Lorine Bears on 03/15/2016 08:20:31 [name], [name] ([id number]) -------------------------------------------------------------------------------- Encounter Discharge Information Details Patient Name: [name] Date of Service: 03/15/2016 8:00 AM Medical Record Number: [id number] Patient Account Number: [id number] Date of Birth/Sex: [birth year] (60 y.o. Male) Treating RN: Primary Care Brodyn Depuy: Annye Asa Other Clinician: Jacqulyn Bath Referring Dezyre Hoefer: Annye Asa Treating Domnique Vantine/Extender: Frann Rider in Treatment: 1 Encounter Discharge Information Items Discharge Pain Level: 0 Discharge Condition: Stable Ambulatory Status: Ambulatory Discharge Destination:  Home Private Transportation: Auto Accompanied By: self Schedule Follow-up Appointment: No Medication Reconciliation completed and No provided to Patient/Care Chevelle Coulson: Clinical Summary of Care: Notes Patient has an HBO treatment scheduled on 03/16/16 at 08:00 am. Electronic Signature(s) Signed: 03/15/2016 10:24:43 AM By: Lorine Bears RCP, RRT, CHT Entered By: Lorine Bears on 03/15/2016 10:24:20 [name], [name] ([id number]) -------------------------------------------------------------------------------- Vitals Details Patient Name: [name] Date of Service: 03/15/2016 8:00 AM Medical Record Number: [id number] Patient Account Number: [id number] Date of Birth/Sex: [birth year] (61 y.o. Male) Treating RN: Primary Care Terressa Evola: Annye Asa Other Clinician: Jacqulyn Bath Referring Camdyn Laden: Annye Asa Treating Yer Olivencia/Extender: Frann Rider in Treatment: 1 Vital Signs Time Taken: 07:55 Temperature (F): 98.5 Height (in): 65 Pulse (bpm): 78 Weight (lbs): 168 Respiratory Rate (breaths/min): 16 Body Mass Index (BMI): 28 Blood Pressure (mmHg): 148/100 Reference Range: 80 - 120 mg / dl Electronic Signature(s) Signed: 03/15/2016 10:24:43 AM By: Lorine Bears RCP, RRT, CHT Entered By: [name], [name] on 03/15/2016 08:21:40

## 2016-03-16 ENCOUNTER — Encounter: Payer: Federal, State, Local not specified - PPO | Admitting: Internal Medicine

## 2016-03-16 DIAGNOSIS — L598 Other specified disorders of the skin and subcutaneous tissue related to radiation: Secondary | ICD-10-CM | POA: Diagnosis not present

## 2016-03-16 DIAGNOSIS — N3041 Irradiation cystitis with hematuria: Secondary | ICD-10-CM | POA: Diagnosis not present

## 2016-03-16 DIAGNOSIS — Z8546 Personal history of malignant neoplasm of prostate: Secondary | ICD-10-CM | POA: Diagnosis not present

## 2016-03-16 DIAGNOSIS — N3289 Other specified disorders of bladder: Secondary | ICD-10-CM | POA: Diagnosis not present

## 2016-03-16 NOTE — Progress Notes (Signed)
[name], [name] ([id number]) Visit Report for 03/16/2016 Arrival Information Details Patient Name: [name], [name] Date of Service: 03/16/2016 10:00 AM Medical Record Number: [id number] Patient Account Number: [id number] Date of Birth/Sex: [birth year] (61 y.o. Male) Treating RN: Primary Care Teesha Ohm: Annye Asa Other Clinician: Jacqulyn Bath Referring Ocia Simek: Annye Asa Treating Zuri Bradway/Extender: Tito Dine in Treatment: 1 Visit Information History Since Last Visit Added or deleted any medications: No Patient Arrived: Ambulatory Any new allergies or adverse reactions: No Arrival Time: 09:50 Had a fall or experienced change in No Accompanied By: self activities of daily living that may affect Transfer Assistance: None risk of falls: Patient Identification Verified: Yes Signs or symptoms of abuse/neglect since last No Secondary Verification Process Yes visito Completed: Hospitalized since last visit: No Patient Requires Transmission-Based No Pain Present Now: No Precautions: Patient Has Alerts: No Electronic Signature(s) Signed: 03/16/2016 12:08:33 PM By: Lorine Bears RCP, RRT, CHT Entered By: Lorine Bears on 03/16/2016 10:17:35 [place], [name] ([id number]) -------------------------------------------------------------------------------- Encounter Discharge Information Details Patient Name: [name] Date of Service: 03/16/2016 10:00 AM Medical Record Number: [id number] Patient Account Number: [id number] Date of Birth/Sex: Dec 07, 1955 (61 y.o. Male) Treating RN: Primary Care Nakyra Bourn: Annye Asa Other Clinician: Jacqulyn Bath Referring Artavious Trebilcock: Annye Asa Treating Nathanyl Andujo/Extender: Tito Dine in Treatment: 1 Encounter Discharge Information Items Discharge Pain Level: 0 Discharge Condition: Stable Ambulatory Status: Ambulatory Discharge Destination:  Home Private Transportation: Auto Accompanied By: self Schedule Follow-up Appointment: No Medication Reconciliation completed and No provided to Patient/Care Makyi Ledo: Clinical Summary of Care: Notes Patient has an HBO treatment scheduled on 03/17/16 at 08:00 am. Electronic Signature(s) Signed: 03/16/2016 12:08:33 PM By: Lorine Bears RCP, RRT, CHT Entered By: Lorine Bears on 03/16/2016 12:08:17 [place], [name] ([id number]) -------------------------------------------------------------------------------- Vitals Details Patient Name: [name] Date of Service: 03/16/2016 10:00 AM Medical Record Number: [id number] Patient Account Number: [id number] Date of Birth/Sex: 24-May-1955 (61 y.o. Male) Treating RN: Primary Care Damauri Minion: Annye Asa Other Clinician: Jacqulyn Bath Referring Takashi Korol: Annye Asa Treating Tinslee Klare/Extender: Ricard Dillon Weeks in Treatment: 1 Vital Signs Time Taken: 09:50 Temperature (F): 98.6 Height (in): 65 Pulse (bpm): 72 Weight (lbs): 168 Respiratory Rate (breaths/min): 16 Body Mass Index (BMI): 28 Blood Pressure (mmHg): 146/100 Reference Range: 80 - 120 mg / dl Electronic Signature(s) Signed: 03/16/2016 12:08:33 PM By: Lorine Bears RCP, RRT, CHT Entered By: Lorine Bears on 03/16/2016 10:17:57

## 2016-03-17 ENCOUNTER — Encounter: Payer: Federal, State, Local not specified - PPO | Admitting: Internal Medicine

## 2016-03-17 DIAGNOSIS — L598 Other specified disorders of the skin and subcutaneous tissue related to radiation: Secondary | ICD-10-CM | POA: Diagnosis not present

## 2016-03-17 DIAGNOSIS — Z8546 Personal history of malignant neoplasm of prostate: Secondary | ICD-10-CM | POA: Diagnosis not present

## 2016-03-17 DIAGNOSIS — N3289 Other specified disorders of bladder: Secondary | ICD-10-CM | POA: Diagnosis not present

## 2016-03-17 DIAGNOSIS — N3041 Irradiation cystitis with hematuria: Secondary | ICD-10-CM | POA: Diagnosis not present

## 2016-03-17 NOTE — Progress Notes (Signed)
IZRAEL, PEAK ([id number]) Visit Report for 03/16/2016 HBO Details Patient Name: [name], [name] Date of Service: 03/16/2016 10:00 AM Medical Record Number: [id number] Patient Account Number: [id number] Date of Birth/Sex: 1955/09/01 (61 y.o. 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 HBO Treatment Details Treatment Number: 9 Patient Type: Outpatient Chamber Type: Monoplace Chamber Serial #: [id number] Treatment Protocol: 2.0 ATA with 90 minutes oxygen, and no air breaks Treatment Details Compression Rate Down: 1.5 psi / minute De-Compression Rate Up: 1.5 psi / minute Air breaks and breathing Compress Tx Pressure periods Decompress Decompress Begins Reached (leave unused spaces Begins Ends blank) Chamber Pressure (ATA) 1 2 - - - - - - 2 1 Clock Time (24 hr) 10:05 10:17 - - - - - - 11:47 11:57 Treatment Length: 112 (minutes) Treatment Segments: 4 Capillary Blood Glucose Pre Capillary Blood Glucose (mg/dl): Post Capillary Blood Glucose (mg/dl): Vital Signs Capillary Blood Glucose Reference Range: 80 - 120 mg / dl HBO Diabetic Blood Glucose Intervention Range: <131 mg/dl or >249 mg/dl Time Vitals Blood Respiratory Capillary Blood Glucose Pulse Action Type: Pulse: Temperature: Taken: Pressure: Rate: Glucose (mg/dl): Meter #: Oximetry (%) Taken: Pre 09:50 146/100 72 16 98.6 Post 11:58 168/96 66 16 98.5 Treatment Response Treatment Completion Status: Treatment Completed without Adverse Event Laird, Logon ([id number]) Zamyia Gowell Notes no concerns with treatment given HBO Attestation I certify that I supervised  this HBO treatment in accordance with Medicare guidelines. A trained Yes emergency response team is readily available per hospital policies and procedures. Continue HBOT as ordered. Yes Electronic Signature(s) Signed: 03/17/2016 7:54:47 AM By: Linton Ham MD Previous Signature: 03/16/2016 12:08:33 PM Version By: Lorine Bears RCP, RRT, CHT Entered By: Linton Ham on 03/17/2016 07:37:35 Garrott, Thanos ([id number]) -------------------------------------------------------------------------------- HBO Safety Checklist Details Patient Name: [name] Date of Service: 03/16/2016 10:00 AM Medical Record Number: [id number] Patient Account Number: [id number] Date of Birth/Sex: 1955/05/03 (61 y.o. Male) Treating RN: Primary Care Cruise Baumgardner: Annye Asa Other Clinician: Jacqulyn Bath Referring Fartun Paradiso: Annye Asa Treating Solveig Fangman/Extender: Tito Dine in Treatment: 1 HBO Safety Checklist Items Safety Checklist Consent Form Signed Patient voided / foley secured and emptied When did you last eato 03/15/16 pm Last dose of injectable or oral agent n/a NA Ostomy pouch emptied and vented if applicable NA All implantable devices assessed, documented and approved NA Intravenous access site secured and place Valuables secured Linens and cotton and cotton/polyester blend (less than 51% polyester) Personal oil-based products / skin lotions / body lotions removed NA Wigs or hairpieces removed NA Smoking or tobacco materials removed Books / newspapers / magazines / loose paper removed Cologne, aftershave, perfume and deodorant removed Jewelry removed (may wrap wedding band) NA Make-up removed Hair care products removed Battery operated devices (external) removed Heating patches and chemical warmers removed NA Titanium eyewear removed NA Nail polish cured greater than 10 hours NA Casting material cured greater than 10 hours NA Hearing aids  removed Loose dentures or partials removed NA Prosthetics have been removed Patient demonstrates correct use of air break device (if applicable) Patient concerns have been addressed Patient grounding bracelet on and cord attached to chamber Specifics for Inpatients (complete in addition to above) Medication sheet sent with patient Intravenous medications needed or due during  therapy sent with patient SHAIN, PAUWELS ([id number]) Drainage tubes (e.g. nasogastric tube or chest tube secured and vented) Endotracheal or Tracheotomy tube secured Cuff deflated of air and inflated with saline Airway suctioned Electronic Signature(s) Signed: 03/16/2016 12:08:33 PM By: Lorine Bears RCP, RRT, CHT Entered By: Lorine Bears on 03/16/2016 10:18:53

## 2016-03-18 ENCOUNTER — Encounter: Payer: Federal, State, Local not specified - PPO | Admitting: Surgery

## 2016-03-18 DIAGNOSIS — Z8546 Personal history of malignant neoplasm of prostate: Secondary | ICD-10-CM | POA: Diagnosis not present

## 2016-03-18 DIAGNOSIS — L598 Other specified disorders of the skin and subcutaneous tissue related to radiation: Secondary | ICD-10-CM | POA: Diagnosis not present

## 2016-03-18 DIAGNOSIS — N3289 Other specified disorders of bladder: Secondary | ICD-10-CM | POA: Diagnosis not present

## 2016-03-18 DIAGNOSIS — N3041 Irradiation cystitis with hematuria: Secondary | ICD-10-CM | POA: Diagnosis not present

## 2016-03-18 NOTE — Progress Notes (Signed)
[name] ([id number]) Visit Report for 03/18/2016 Arrival Information Details Patient Name: [name], [name] Date of Service: 03/18/2016 8:00 AM Medical Record Number: [id number] Patient Account Number: [id number] Date of Birth/Sex: Nov 17, 1955 (61 y.o. Male) Treating RN: Primary Care Jaymin Waln: Annye Asa Other Clinician: Jacqulyn Bath Referring Luz Burcher: Annye Asa Treating Mari Battaglia/Extender: Frann Rider in Treatment: 2 Visit Information History Since Last Visit Added or deleted any medications: No Patient Arrived: Ambulatory Any new allergies or adverse reactions: No Arrival Time: 07:55 Had a fall or experienced change in No Accompanied By: self activities of daily living that may affect Transfer Assistance: None risk of falls: Patient Identification Verified: Yes Signs or symptoms of abuse/neglect since last No Secondary Verification Process Yes visito Completed: Hospitalized since last visit: No Patient Requires Transmission-Based No Pain Present Now: No Precautions: Patient Has Alerts: No Electronic Signature(s) Signed: 03/18/2016 10:16:21 AM By: Lorine Bears RCP, RRT, CHT Entered By: Lorine Bears on 03/18/2016 08:25:10 [name] ([id number]) -------------------------------------------------------------------------------- Encounter Discharge Information Details Patient Name: [name] Date of Service: 03/18/2016 8:00 AM Medical Record Number: [id number] Patient Account Number: [id number] Date of Birth/Sex: 1955-11-05 (61 y.o. Male) Treating RN: Primary Care Creasie Lacosse: Annye Asa Other Clinician: Jacqulyn Bath Referring Nelissa Bolduc: Annye Asa Treating Torri Langston/Extender: Frann Rider in Treatment: 2 Encounter Discharge Information Items Discharge Pain Level: 0 Discharge Condition: Stable Ambulatory Status: Ambulatory Discharge Destination:  Home Private Transportation: Auto Accompanied By: self Schedule Follow-up Appointment: No Medication Reconciliation completed and No provided to Patient/Care Kyanna Mahrt: Clinical Summary of Care: Notes Patient has an HBO treatment scheduled on 03/19/16 at 08:00 am. Electronic Signature(s) Signed: 03/18/2016 10:16:21 AM By: Lorine Bears RCP, RRT, CHT Entered By: Lorine Bears on 03/18/2016 10:15:15 [name] ([id number]) -------------------------------------------------------------------------------- Vitals Details Patient Name: [name] Date of Service: 03/18/2016 8:00 AM Medical Record Number: [id number] Patient Account Number: [id number] Date of Birth/Sex: August 23, 1955 (60 y.o. Male) Treating RN: Primary Care Karina Lenderman: Annye Asa Other Clinician: Jacqulyn Bath Referring Priyah Schmuck: Annye Asa Treating Steen Bisig/Extender: Frann Rider in Treatment: 2 Vital Signs Time Taken: 08:00 Temperature (F): 98.3 Height (in): 65 Pulse (bpm): 72 Weight (lbs): 168 Respiratory Rate (breaths/min): 16 Body Mass Index (BMI): 28 Blood Pressure (mmHg): 148/82 Reference Range: 80 - 120 mg / dl Electronic Signature(s) Signed: 03/18/2016 10:16:21 AM By: Lorine Bears RCP, RRT, CHT Entered By: Becky Sax, Amado Nash on 03/18/2016 08:25:35

## 2016-03-18 NOTE — Progress Notes (Signed)
DANNEL, RAFTER ([id number]) Visit Report for 03/17/2016 Arrival Information Details Patient Name: [name], [name] Date of Service: 03/17/2016 8:00 AM Medical Record Number: [id number] Patient Account Number: [id number] Date of Birth/Sex: Jun 25, 1955 (61 y.o. Male) Treating RN: Baruch Gouty, RN, BSN, Velva Harman Primary Care Alima Naser: Annye Asa Other Clinician: Jacqulyn Bath Referring Yalissa Fink: Annye Asa Treating Brittany Osier/Extender: Tito Dine in Treatment: 2 Visit Information History Since Last Visit All ordered tests and consults were completed: No Patient Arrived: Ambulatory Added or deleted any medications: No Arrival Time: 07:53 Any new allergies or adverse reactions: No Accompanied By: self Had a fall or experienced change in No Transfer Assistance: None activities of daily living that may affect Patient Identification Verified: Yes risk of falls: Secondary Verification Process Yes Signs or symptoms of abuse/neglect since last No Completed: visito Patient Requires Transmission-Based No Hospitalized since last visit: No Precautions: Pain Present Now: No Patient Has Alerts: No Electronic Signature(s) Signed: 03/17/2016 5:04:15 PM By: Regan Lemming BSN, RN Entered By: Regan Lemming on 03/17/2016 08:09:39 [place], [place] ([id number]) -------------------------------------------------------------------------------- Encounter Discharge Information Details Patient Name: [name] Date of Service: 03/17/2016 8:00 AM Medical Record Number: [id number] Patient Account Number: [id number] Date of Birth/Sex: Jul 09, 1955 (61 y.o. Male) Treating RN: Baruch Gouty, RN, BSN, Velva Harman Primary Care Kadie Balestrieri: Annye Asa Other Clinician: Jacqulyn Bath Referring Darra Rosa: Annye Asa Treating Coral Timme/Extender: Tito Dine in Treatment: 2 Encounter Discharge Information Items Discharge Pain Level: 0 Discharge Condition: Stable Ambulatory Status:  Ambulatory Discharge Destination: Home Private Transportation: Auto Accompanied By: self Schedule Follow-up Appointment: No Medication Reconciliation completed and No provided to Patient/Care Elias Bordner: Clinical Summary of Care: Electronic Signature(s) Signed: 03/17/2016 5:04:15 PM By: Regan Lemming BSN, RN Entered By: Regan Lemming on 03/17/2016 10:21:09 [name] ([id number]) -------------------------------------------------------------------------------- Patient/Caregiver Education Details Patient Name: [name] Date of Service: 03/17/2016 8:00 AM Medical Record Patient Account Number: [id number] Number: Treating RN: Baruch Gouty, RN, BSN, Rita [birth year] [phone] y.o. Other Clinician: Jacqulyn Bath Date of Birth/Gender: Male) Treating Linton Ham Primary Care Physician: Annye Asa Physician/Extender: G Referring Physician: Letha Cape in Treatment: 2 Education Assessment Education Provided To: Patient Education Topics Provided Hyperbaric Oxygenation: Methods: Explain/Verbal Responses: State content correctly Electronic Signature(s) Signed: 03/17/2016 5:04:15 PM By: Regan Lemming BSN, RN Entered By: Regan Lemming on 03/17/2016 10:19:04 [place], [place]acey ([id number]) -------------------------------------------------------------------------------- [place] Details Patient Name: [name] Date of Service: 03/17/2016 8:00 AM Medical Record Number: [id number] Patient Account Number: [id number] Date of Birth/Sex: Nov 21, 1955 (60 y.o. Male) Treating RN: Afful, RN, BSN, [place] Primary Care Theodor Mustin: Annye Asa Other Clinician: Jacqulyn Bath Referring Alfrieda Tarry: Annye Asa Treating Yamilex Borgwardt/Extender: Tito Dine in Treatment: 2 Vital Signs Time Taken: 07:53 Temperature (F): 98.1 Height (in): 65 Pulse (bpm): 66 Weight (lbs): 168 Respiratory Rate (breaths/min): 16 Body Mass Index (BMI): 28 Blood Pressure (mmHg):  144/90 Reference Range: 80 - 120 mg / dl Electronic Signature(s) Signed: 03/17/2016 5:04:15 PM By: Regan Lemming BSN, RN Entered By: Regan Lemming on 03/17/2016 08:10:45

## 2016-03-18 NOTE — Progress Notes (Signed)
JASKIRAT, SCHWIEGER ([id number]) Visit Report for 03/18/2016 HBO Details Patient Name: [name], [name] Date of Service: 03/18/2016 8:00 AM Medical Record Number: [id number] Patient Account Number: [id number] Date of Birth/Sex: 01-04-1956 (61 y.o. 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 HBO Treatment Details Treatment Number: 11 Patient Type: Outpatient Chamber Type: Monoplace Chamber Serial #: [id number] Treatment Protocol: 2.0 ATA with 90 minutes oxygen, and no air breaks Treatment Details Compression Rate Down: 1.5 psi / minute De-Compression Rate Up: 1.5 psi / minute Air breaks and breathing Compress Tx Pressure periods Decompress Decompress Begins Reached (leave unused spaces Begins Ends blank) Chamber Pressure (ATA) 1 2 - - - - - - 2 1 Clock Time (24 hr) 08:11 08:23 - - - - - - 09:53 10:03 Treatment Length: 112 (minutes) Treatment Segments: 4 Capillary Blood Glucose Pre Capillary Blood Glucose (mg/dl): Post Capillary Blood Glucose (mg/dl): Vital Signs Capillary Blood Glucose Reference Range: 80 - 120 mg / dl HBO Diabetic Blood Glucose Intervention Range: <131 mg/dl or >249 mg/dl Time Vitals Blood Respiratory Capillary Blood Glucose Pulse Action Type: Pulse: Temperature: Taken: Pressure: Rate: Glucose (mg/dl): Meter #: Oximetry (%) Taken: Pre 08:00 148/82 72 16 98.3 Post 10:05 152/100 72 16 98.4 Treatment Response Treatment Completion Status: Treatment Completed without Adverse Event [name], [name] ([id number]) HBO Attestation I certify that I supervised this HBO treatment in accordance with Medicare  guidelines. A trained Yes emergency response team is readily available per hospital policies and procedures. Continue HBOT as ordered. Yes Electronic Signature(s) Signed: 03/18/2016 10:20:22 AM By: Christin Fudge MD, FACS Previous Signature: 03/18/2016 10:16:21 AM Version By: Lorine Bears RCP, RRT, CHT Entered By: Christin Fudge on 03/18/2016 10:20:22 [name] ([id number]) -------------------------------------------------------------------------------- HBO Safety Checklist Details Patient Name: [name] Date of Service: 03/18/2016 8:00 AM Medical Record Number: [id number] Patient Account Number: [id number] Date of Birth/Sex: [birth year] (61 y.o. Male) Treating RN: Primary Care Robyn Galati: Annye Asa Other Clinician: Jacqulyn Bath Referring Nabilah Davoli: Annye Asa Treating Scotlyn Mccranie/Extender: Frann Rider in Treatment: 2 HBO Safety Checklist Items Safety Checklist Consent Form Signed Patient voided / foley secured and emptied When did you last eato 03/17/16 pm Last dose of injectable or oral agent n/a NA Ostomy pouch emptied and vented if applicable NA All implantable devices assessed, documented and approved NA Intravenous access site secured and place Valuables secured Linens and cotton and cotton/polyester blend (less than 51% polyester) Personal oil-based products / skin lotions / body lotions removed NA Wigs or hairpieces removed NA Smoking or tobacco materials removed Books / newspapers / magazines / loose paper removed Cologne, aftershave, perfume and deodorant removed Jewelry removed (may wrap wedding band) NA Make-up removed Hair care products removed Battery operated devices (external) removed Heating patches and chemical warmers removed NA Titanium eyewear removed NA Nail polish cured greater than 10 hours NA Casting material cured greater than 10 hours NA Hearing aids removed Loose dentures or partials removed NA  Prosthetics have been removed Patient demonstrates correct use of air break device (if applicable) Patient concerns have been addressed Patient grounding bracelet on and cord attached to chamber Specifics for Inpatients (complete in addition to above) Medication sheet sent with patient Intravenous medications needed or due during therapy sent with patient [name], [name] ([id number]) Drainage  tubes (e.g. nasogastric tube or chest tube secured and vented) Endotracheal or Tracheotomy tube secured Cuff deflated of air and inflated with saline Airway suctioned Electronic Signature(s) Signed: 03/18/2016 10:16:21 AM By: Lorine Bears RCP, RRT, CHT Entered By: Becky Sax, Amado Nash on 03/18/2016 08:26:20

## 2016-03-19 ENCOUNTER — Encounter: Payer: Federal, State, Local not specified - PPO | Admitting: Surgery

## 2016-03-19 DIAGNOSIS — N3041 Irradiation cystitis with hematuria: Secondary | ICD-10-CM | POA: Diagnosis not present

## 2016-03-19 DIAGNOSIS — Z8546 Personal history of malignant neoplasm of prostate: Secondary | ICD-10-CM | POA: Diagnosis not present

## 2016-03-19 DIAGNOSIS — N3289 Other specified disorders of bladder: Secondary | ICD-10-CM | POA: Diagnosis not present

## 2016-03-19 DIAGNOSIS — L598 Other specified disorders of the skin and subcutaneous tissue related to radiation: Secondary | ICD-10-CM | POA: Diagnosis not present

## 2016-03-19 NOTE — Progress Notes (Addendum)
[name], [name] ([id number]) Visit Report for 03/19/2016 Arrival Information Details Patient Name: [name], [name] Date of Service: 03/19/2016 8:00 AM Medical Record Number: [id number] Patient Account Number: [id number] Date of Birth/Sex: October 11, 1955 (61 y.o. Male) Treating RN: Cornell Barman Primary Care Augustina Braddock: Annye Asa Other Clinician: Jacqulyn Bath Referring Ademide Schaberg: Annye Asa Treating Valissa Lyvers/Extender: Frann Rider in Treatment: 2 Visit Information History Since Last Visit Added or deleted any medications: No Patient Arrived: Ambulatory Any new allergies or adverse reactions: No Arrival Time: 07:47 Had a fall or experienced change in No Accompanied By: self activities of daily living that may affect Transfer Assistance: None risk of falls: Patient Identification Verified: Yes Signs or symptoms of abuse/neglect since last No Secondary Verification Process Yes visito Completed: Hospitalized since last visit: No Patient Requires Transmission-Based No Pain Present Now: No Precautions: Patient Has Alerts: No Electronic Signature(s) Signed: 03/19/2016 8:19:22 AM By: Gretta Cool, RN, BSN, Kim RN, BSN Entered By: Gretta Cool, RN, BSN, Kim on 03/19/2016 08:19:21 [name] ([id number]) -------------------------------------------------------------------------------- Encounter Discharge Information Details Patient Name: [name] Date of Service: 03/19/2016 8:00 AM Medical Record Number: [id number] Patient Account Number: [id number] Date of Birth/Sex: 1955-02-10 (61 y.o. Male) Treating RN: Cornell Barman Primary Care Keni Wafer: Annye Asa Other Clinician: Jacqulyn Bath Referring Mikie Misner: Annye Asa Treating Yasaman Kolek/Extender: Frann Rider in Treatment: 2 Encounter Discharge Information Items Discharge Pain Level: 0 Discharge Condition: Stable Ambulatory Status: Ambulatory Emergency Discharge Destination: Room Transportation: Private  Auto Accompanied By: self Schedule Follow-up Appointment: Yes Medication Reconciliation completed Yes and provided to Patient/Care Rebeka Kimble: Clinical Summary of Care: Electronic Signature(s) Signed: 03/19/2016 4:59:14 PM By: Gretta Cool, RN, BSN, Kim RN, BSN Entered By: Gretta Cool, RN, BSN, Kim on 03/19/2016 10:33:51 [name] ([id number]) -------------------------------------------------------------------------------- Patient/Caregiver Education Details Patient Name: [name] Date of Service: 03/19/2016 8:00 AM Medical Record Number: [id number] Patient Account Number: [id number] Date of Birth/Gender: 11/10/1955 (61 y.o. Male) Treating RN: Cornell Barman Primary Care Physician: Annye Asa Other Clinician: Jacqulyn Bath Referring Physician: Annye Asa Treating Physician/Extender: Frann Rider in Treatment: 2 Education Assessment Education Provided To: Patient Education Topics Provided Electronic Signature(s) Signed: 03/19/2016 4:59:14 PM By: Gretta Cool, RN, BSN, Kim RN, BSN Entered By: Gretta Cool, RN, BSN, Kim on 03/19/2016 10:33:36 [name] ([id number]) -------------------------------------------------------------------------------- Vitals Details Patient Name: [name] Date of Service: 03/19/2016 8:00 AM Medical Record Number: [id number] Patient Account Number: [id number] Date of Birth/Sex: 11/23/1955 (61 y.o. Male) Treating RN: Cornell Barman Primary Care Puneet Selden: Annye Asa Other Clinician: Jacqulyn Bath Referring Viveca Beckstrom: Annye Asa Treating Goldie Dimmer/Extender: Frann Rider in Treatment: 2 Vital Signs Time Taken: 08:00 Temperature (F): 98.4 Height (in): 65 Pulse (bpm): 80 Weight (lbs): 168 Respiratory Rate (breaths/min): 16 Body Mass Index (BMI): 28 Blood Pressure (mmHg): 160/90 Reference Range: 80 - 120 mg / dl Electronic Signature(s) Signed: 03/19/2016 8:19:41 AM By: Gretta Cool, RN, BSN, Kim RN, BSN Entered By: Gretta Cool, RN,  BSN, Kim on 03/19/2016 08:19:41

## 2016-03-19 NOTE — Progress Notes (Signed)
[name], [name] ([id number]) Visit Report for 03/19/2016 HBO Details Patient Name: [name], [name] Date of Service: 03/19/2016 8:00 AM Medical Record Number: [id number] Patient Account Number: [id number] Date of Birth/Sex: [birth year] (61 y.o. 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 HBO Treatment Details Treatment Number: 12 Patient Type: Outpatient Chamber Type: Monoplace Chamber Serial #: [id number] Treatment Protocol: 2.0 ATA with 90 minutes oxygen, and no air breaks Treatment Details Compression Rate Down: 1.5 psi / minute De-Compression Rate Up: 1.5 psi / minute Air breaks and breathing Compress Tx Pressure periods Decompress Decompress Begins Reached (leave unused spaces Begins Ends blank) Chamber Pressure (ATA) 1 2 - - - - - - 2 1 Clock Time (24 hr) 08:06 08:21 - - - - - - 09:51 10:03 Treatment Length: 117 (minutes) Treatment Segments: 4 Capillary Blood Glucose Pre Capillary Blood Glucose (mg/dl): Post Capillary Blood Glucose (mg/dl): Vital Signs Capillary Blood Glucose Reference Range: 80 - 120 mg / dl HBO Diabetic Blood Glucose Intervention Range: <131 mg/dl or >249 mg/dl Time Vitals Blood Respiratory Capillary Blood Glucose Pulse Action Type: Pulse: Temperature: Taken: Pressure: Rate: Glucose (mg/dl): Meter #: Oximetry (%) Taken: Pre 08:00 160/90 80 16 98.4 Post 10:08 200/85 82 16 98.2 Treatment Response Well Karges, [name] ([id number]) Treatment Toleration: Treatment Treatment Completed without Adverse Event Completion Status: Treatment Notes MD Notified of BP. Patient to follow-up  with PCP regarding elevated BP. HBO Attestation I certify that I supervised this HBO treatment in accordance with Medicare guidelines. A trained Yes emergency response team is readily available per hospital policies and procedures. Continue HBOT as ordered. Yes Electronic Signature(s) Signed: 03/19/2016 10:38:57 AM By: Christin Fudge MD, FACS Previous Signature: 03/19/2016 10:09:10 AM Version By: Gretta Cool RN, BSN, Kim RN, BSN Previous Signature: 03/19/2016 10:02:28 AM Version By: Gretta Cool RN, BSN, Kim RN, BSN Previous Signature: 03/19/2016 9:52:12 AM Version By: Gretta Cool, RN, BSN, Kim RN, BSN Previous Signature: 03/19/2016 8:23:08 AM Version By: Gretta Cool, RN, BSN, Kim RN, BSN Entered By: Christin Fudge on 03/19/2016 10:38:57 [name], [name] ([id number]) -------------------------------------------------------------------------------- HBO Safety Checklist Details Patient Name: [name] Date of Service: 03/19/2016 8:00 AM Medical Record Number: [id number] Patient Account Number: [id number] Date of Birth/Sex: 1955/08/29 (62 y.o. Male) Treating RN: Cornell Barman Primary Care Lynleigh Kovack: Annye Asa Other Clinician: Jacqulyn Bath Referring Esmeralda Malay: Annye Asa Treating Daziyah Cogan/Extender: Frann Rider in Treatment: 2 HBO Safety Checklist Items Safety Checklist Consent Form Signed Patient voided / foley secured and emptied When did you last eato 7am Last dose of injectable or oral agent n/a NA Ostomy pouch emptied and vented if applicable NA All implantable devices assessed, documented and approved NA Intravenous access site secured and place Valuables secured Linens and cotton and cotton/polyester blend (less than 51% polyester) Personal oil-based products / skin lotions / body lotions removed NA Wigs or hairpieces removed NA Smoking or tobacco materials removed Books / newspapers / magazines / loose paper removed Cologne, aftershave, perfume and deodorant removed Jewelry  removed (may wrap wedding band) NA Make-up removed NA Hair care products removed NA Battery operated devices (external) removed NA Heating patches and chemical warmers removed NA Titanium eyewear removed NA Nail polish cured greater than 10 hours NA Casting material cured greater than 10 hours NA Hearing aids  removed Loose dentures or partials removed NA Prosthetics have been removed Patient demonstrates correct use of air break device (if applicable) Patient concerns have been addressed Patient grounding bracelet on and cord attached to chamber Specifics for Inpatients (complete in addition to above) Medication sheet sent with patient Intravenous medications needed or due during therapy sent with patient [name], [name] ([id number]) Drainage tubes (e.g. nasogastric tube or chest tube secured and vented) Endotracheal or Tracheotomy tube secured Cuff deflated of air and inflated with saline Airway suctioned Electronic Signature(s) Signed: 03/19/2016 8:20:50 AM By: Gretta Cool, RN, BSN, Kim RN, BSN Entered By: Gretta Cool, RN, BSN, Kim on 03/19/2016 08:20:50

## 2016-03-22 ENCOUNTER — Encounter: Payer: Federal, State, Local not specified - PPO | Admitting: Surgery

## 2016-03-22 DIAGNOSIS — L598 Other specified disorders of the skin and subcutaneous tissue related to radiation: Secondary | ICD-10-CM | POA: Diagnosis not present

## 2016-03-22 DIAGNOSIS — N3041 Irradiation cystitis with hematuria: Secondary | ICD-10-CM | POA: Diagnosis not present

## 2016-03-22 DIAGNOSIS — Z8546 Personal history of malignant neoplasm of prostate: Secondary | ICD-10-CM | POA: Diagnosis not present

## 2016-03-22 DIAGNOSIS — N3289 Other specified disorders of bladder: Secondary | ICD-10-CM | POA: Diagnosis not present

## 2016-03-22 NOTE — Progress Notes (Signed)
[name], [name] ([id number]) Visit Report for 03/22/2016 Arrival Information Details Patient Name: [name], [name] Date of Service: 03/22/2016 8:00 AM Medical Record Number: [id number] Patient Account Number: [id number] Date of Birth/Sex: [birth year] (61 y.o. Male) Treating RN: Primary Care Charistopher Rumble: Annye Asa Other Clinician: Jacqulyn Bath Referring Izzy Courville: Annye Asa Treating Taylin Mans/Extender: Frann Rider in Treatment: 2 Visit Information History Since Last Visit Added or deleted any medications: No Patient Arrived: Ambulatory Any new allergies or adverse reactions: No Arrival Time: 08:00 Had a fall or experienced change in No Accompanied By: self activities of daily living that may affect Transfer Assistance: None risk of falls: Patient Identification Verified: Yes Signs or symptoms of abuse/neglect since last No Secondary Verification Process Yes visito Completed: Hospitalized since last visit: No Patient Requires Transmission-Based No Pain Present Now: No Precautions: Patient Has Alerts: No Electronic Signature(s) Signed: 03/22/2016 1:06:41 PM By: Lorine Bears RCP, RRT, CHT Entered By: Lorine Bears on 03/22/2016 08:21:10 [place], [place] ([id number]) -------------------------------------------------------------------------------- Encounter Discharge Information Details Patient Name: [name] Date of Service: 03/22/2016 8:00 AM Medical Record Number: [id number] Patient Account Number: [id number] Date of Birth/Sex: 1955-01-08 (61 y.o. Male) Treating RN: Primary Care Diamante Truszkowski: Annye Asa Other Clinician: Jacqulyn Bath Referring Drayton Tieu: Annye Asa Treating Alnisa Hasley/Extender: Frann Rider in Treatment: 2 Encounter Discharge Information Items Discharge Pain Level: 0 Discharge Condition: Stable Ambulatory Status: Ambulatory Discharge Destination:  Home Private Transportation: Auto Accompanied By: self Schedule Follow-up Appointment: No Medication Reconciliation completed and No provided to Patient/Care Kordae Buonocore: Clinical Summary of Care: Notes Patient has an HBO treatment scheduled on 03/22/16 at 08:00 am. Electronic Signature(s) Signed: 03/22/2016 1:06:41 PM By: Lorine Bears RCP, RRT, CHT Entered By: Lorine Bears on 03/22/2016 11:08:29 [place], [place] ([id number]) -------------------------------------------------------------------------------- Vitals Details Patient Name: [name] Date of Service: 03/22/2016 8:00 AM Medical Record Number: [id number] Patient Account Number: [id number] Date of Birth/Sex: 1955-12-19 (61 y.o. Male) Treating RN: Primary Care Gillermo Poch: Annye Asa Other Clinician: Jacqulyn Bath Referring Lakeesha Fontanilla: Annye Asa Treating Zadia Uhde/Extender: Frann Rider in Treatment: 2 Vital Signs Time Taken: 08:03 Temperature (F): 98.5 Height (in): 65 Pulse (bpm): 60 Weight (lbs): 168 Respiratory Rate (breaths/min): 16 Body Mass Index (BMI): 28 Blood Pressure (mmHg): 132/86 Reference Range: 80 - 120 mg / dl Electronic Signature(s) Signed: 03/22/2016 1:06:41 PM By: Lorine Bears RCP, RRT, CHT Entered By: Becky Sax, Amado Nash on 03/22/2016 08:22:43

## 2016-03-22 NOTE — Progress Notes (Signed)
CORAN, DIPAOLA ([id number]) Visit Report for 03/22/2016 Problem List Details Patient Name: [name], [name] Date of Service: 03/22/2016 8:00 AM Medical Record Number: [id number] Patient Account Number: [id number] Date of Birth/Sex: 08/24/1955 (61 y.o. 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 Z85.46 Personal history of malignant neoplasm of prostate Facility Procedures CPT4 Code: [id number] Description: (Facility Use Only) HBOT, full body chamber, 5min Modifier: Quantity: 4 Physician Procedures CPT4: Description Modifier Quantity Code 2993716 [id number] - WC PHYS HYPERBARIC OXYGEN THERAPY  1 ICD-10 Description Diagnosis 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

## 2016-03-23 ENCOUNTER — Encounter: Payer: Federal, State, Local not specified - PPO | Admitting: Internal Medicine

## 2016-03-23 DIAGNOSIS — L598 Other specified disorders of the skin and subcutaneous tissue related to radiation: Secondary | ICD-10-CM | POA: Diagnosis not present

## 2016-03-23 DIAGNOSIS — Z8546 Personal history of malignant neoplasm of prostate: Secondary | ICD-10-CM | POA: Diagnosis not present

## 2016-03-23 DIAGNOSIS — N3041 Irradiation cystitis with hematuria: Secondary | ICD-10-CM | POA: Diagnosis not present

## 2016-03-23 DIAGNOSIS — N3289 Other specified disorders of bladder: Secondary | ICD-10-CM | POA: Diagnosis not present

## 2016-03-23 NOTE — Progress Notes (Signed)
[name] ([id number]) Visit Report for 03/22/2016 HBO Details Patient Name: [name], [facility] Date of Service: 03/22/2016 8:00 AM Medical Record Number: [id number] Patient Account Number: [id number] Date of Birth/Sex: [birth year] (61 y.o. 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 HBO Treatment Details Treatment Number: 13 Patient Type: Outpatient Chamber Type: Monoplace Chamber Serial #: [id number] Treatment Protocol: 2.0 ATA with 90 minutes oxygen, and no air breaks Treatment Details Compression Rate Down: 1.5 psi / minute De-Compression Rate Up: 1.5 psi / minute Air breaks and breathing Compress Tx Pressure periods Decompress Decompress Begins Reached (leave unused spaces Begins Ends blank) Chamber Pressure (ATA) 1 2 - - - - - - 2 1 Clock Time (24 hr) 08:14 08:26 - - - - - - 09:56 10:06 Treatment Length: 112 (minutes) Treatment Segments: 4 Capillary Blood Glucose Pre Capillary Blood Glucose (mg/dl): Post Capillary Blood Glucose (mg/dl): Vital Signs Capillary Blood Glucose Reference Range: 80 - 120 mg / dl HBO Diabetic Blood Glucose Intervention Range: <131 mg/dl or >249 mg/dl Time Vitals Blood Respiratory Capillary Blood Glucose Pulse Action Type: Pulse: Temperature: Taken: Pressure: Rate: Glucose (mg/dl): Meter #: Oximetry (%) Taken: Pre 08:03 132/86 60 16 98.5 Post 10:10 158/86 66 16 98.2 Treatment Response Treatment Completion Status: Treatment Completed without Adverse Event Lamagna, Shamel ([id number]) Electronic Signature(s) Signed: 03/22/2016 1:06:41 PM By: Lorine Bears RCP, RRT,  CHT Signed: 03/22/2016 4:53:16 PM By: Christin Fudge MD, FACS Previous Signature: 03/22/2016 10:17:49 AM Version By: Christin Fudge MD, FACS Entered By: Lorine Bears on 03/22/2016 11:07:36 Scraper, Kemal ([id number]) -------------------------------------------------------------------------------- HBO Safety Checklist Details Patient Name: [name] Date of Service: 03/22/2016 8:00 AM Medical Record Number: [id number] Patient Account Number: [id number] Date of Birth/Sex: [birth year] (61 y.o. Male) Treating RN: Primary Care Carlina Derks: Annye Asa Other Clinician: Jacqulyn Bath Referring Nelsie Domino: Annye Asa Treating Everlena Mackley/Extender: Frann Rider in Treatment: 2 HBO Safety Checklist Items Safety Checklist Consent Form Signed Patient voided / foley secured and emptied When did you last eato 03/21/16 Last dose of injectable or oral agent n/a NA Ostomy pouch emptied and vented if applicable NA All implantable devices assessed, documented and approved NA Intravenous access site secured and place Valuables secured Linens and cotton and cotton/polyester blend (less than 51% polyester) Personal oil-based products / skin lotions / body lotions removed NA Wigs or hairpieces removed NA Smoking or tobacco materials removed Books / newspapers / magazines / loose paper removed Cologne, aftershave, perfume and deodorant removed Jewelry removed (may wrap wedding band) NA Make-up removed Hair care products removed Battery operated devices (external) removed Heating patches and chemical warmers removed NA Titanium eyewear removed NA Nail polish cured greater than 10 hours NA Casting material cured greater than 10 hours NA Hearing aids removed Loose dentures or partials removed NA Prosthetics have been removed Patient demonstrates correct use of air break device (if applicable) Patient concerns have been addressed Patient grounding bracelet on and cord  attached to chamber Specifics for Inpatients (complete in addition to above) Medication sheet sent with patient Intravenous medications needed or due during therapy sent with patient LEGER, Aarya ([id number]) Drainage tubes (e.g. nasogastric tube or chest tube secured and vented) Endotracheal or Tracheotomy tube secured Cuff deflated of air and inflated with saline Airway suctioned  Electronic Signature(s) Signed: 03/22/2016 1:06:41 PM By: Lorine Bears RCP, RRT, CHT Entered By: Lorine Bears on 03/22/2016 08:23:33

## 2016-03-23 NOTE — Progress Notes (Signed)
MEHUL, RUDIN ([id number]) Visit Report for 03/17/2016 HBO Details Patient Name: CHADDRICK, BRUE Date of Service: 03/17/2016 8:00 AM Medical Record Number: [id number] Patient Account Number: [id number] Date of Birth/Sex: 1955-09-18 (61 y.o. 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 HBO Treatment Details Treatment Number: 10 Patient Type: Outpatient Chamber Type: Monoplace Chamber Serial #: [id number] Treatment Protocol: 2.0 ATA with 90 minutes oxygen, and no air breaks Treatment Details Compression Rate Down: 1.5 psi / minute De-Compression Rate Up: 1.5 psi / minute Air breaks and breathing Compress Tx Pressure periods Decompress Decompress Begins Reached (leave unused spaces Begins Ends blank) Chamber Pressure (ATA) 1 2 - - - - - - 2 1 Clock Time (24 hr) 08:07 08:18 - - - - - - 09:49 10:02 Treatment Length: 115 (minutes) Treatment Segments: 4 Capillary Blood Glucose Pre Capillary Blood Glucose (mg/dl): Post Capillary Blood Glucose (mg/dl): Vital Signs Capillary Blood Glucose Reference Range: 80 - 120 mg / dl HBO Diabetic Blood Glucose Intervention Range: <131 mg/dl or >249 mg/dl Time Vitals Blood Respiratory Capillary Blood Glucose Pulse Action Type: Pulse: Temperature: Taken: Pressure: Rate: Glucose (mg/dl): Meter #: Oximetry (%) Taken: Pre 07:53 144/90 66 16 98.1 Post 10:02 160/98 68 17 98.1 Treatment Response Well Jons, Cadence ([id number]) Treatment Toleration: Treatment Treatment Completed without Adverse Event Completion Status: Ruba Outen Notes no concerns with treatment  given HBO Attestation I certify that I supervised this HBO treatment in accordance with Medicare guidelines. A trained Yes emergency response team is readily available per hospital policies and procedures. Continue HBOT as ordered. Yes Electronic Signature(s) Signed: 03/17/2016 4:32:29 PM By: Regan Lemming BSN, RN Signed: 03/23/2016 7:37:15 AM By: Linton Ham MD Previous Signature: 03/17/2016 4:24:49 PM Version By: Linton Ham MD Entered By: Regan Lemming on 03/17/2016 16:32:28 COSMA, Anden ([id number]) -------------------------------------------------------------------------------- HBO Safety Checklist Details Patient Name: [name] Date of Service: 03/17/2016 8:00 AM Medical Record Number: [id number] Patient Account Number: [id number] Date of Birth/Sex: [birth year] (61 y.o. Male) Treating RN: Baruch Gouty, RN, BSN, [place] Primary Care Emmakate Hypes: Annye Asa Other Clinician: Jacqulyn Bath Referring Latroya Ng: Annye Asa Treating Parminder Trapani/Extender: Tito Dine in Treatment: 2 HBO Safety Checklist Items Safety Checklist Consent Form Signed Patient voided / foley secured and emptied When did you last eato 03/16/16 Last dose of injectable or oral agent n/a NA Ostomy pouch emptied and vented if applicable NA All implantable devices assessed, documented and approved NA Intravenous access site secured and place Valuables secured Linens and cotton and cotton/polyester blend (less than 51% polyester) Personal oil-based products / skin lotions / body lotions removed NA Wigs or hairpieces removed NA Smoking or tobacco materials removed Books / newspapers / magazines / loose paper removed Cologne, aftershave, perfume and deodorant removed Jewelry removed (may wrap wedding band) NA Make-up removed NA Hair care products removed NA Battery operated devices (external) removed NA Heating patches and chemical warmers removed NA Titanium eyewear removed NA Nail  polish cured greater than 10 hours NA Casting material cured greater than 10 hours NA Hearing aids removed Loose dentures or partials removed NA Prosthetics have been removed Patient demonstrates correct use of air break device (if applicable) Patient concerns have been addressed Patient grounding bracelet on and cord attached to chamber  Specifics for Inpatients (complete in addition to above) Medication sheet sent with patient Intravenous medications needed or due during therapy sent with patient KANNER, Demon ([id number]) Drainage tubes (e.g. nasogastric tube or chest tube secured and vented) Endotracheal or Tracheotomy tube secured Cuff deflated of air and inflated with saline Airway suctioned Electronic Signature(s) Signed: 03/17/2016 5:04:15 PM By: Regan Lemming BSN, RN Entered By: Regan Lemming on 03/17/2016 08:12:29

## 2016-03-24 ENCOUNTER — Encounter: Payer: Federal, State, Local not specified - PPO | Admitting: Internal Medicine

## 2016-03-24 DIAGNOSIS — L598 Other specified disorders of the skin and subcutaneous tissue related to radiation: Secondary | ICD-10-CM | POA: Diagnosis not present

## 2016-03-24 DIAGNOSIS — Z8546 Personal history of malignant neoplasm of prostate: Secondary | ICD-10-CM | POA: Diagnosis not present

## 2016-03-24 DIAGNOSIS — N3289 Other specified disorders of bladder: Secondary | ICD-10-CM | POA: Diagnosis not present

## 2016-03-24 DIAGNOSIS — N3041 Irradiation cystitis with hematuria: Secondary | ICD-10-CM | POA: Diagnosis not present

## 2016-03-24 NOTE — Progress Notes (Signed)
SILVESTRE, MINES ([id number]) Visit Report for 03/23/2016 HBO Details Patient Name: [name], [name] Date of Service: 03/23/2016 8:00 AM Medical Record Number: [id number] Patient Account Number: [id number] Date of Birth/Sex: 23-Jun-1955 (61 y.o. 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 HBO Treatment Details Treatment Number: 14 Patient Type: Outpatient Chamber Type: Monoplace Chamber Serial #: [id number] Treatment Protocol: 2.0 ATA with 90 minutes oxygen, and no air breaks Treatment Details Compression Rate Down: 1.5 psi / minute De-Compression Rate Up: 1.5 psi / minute Air breaks and breathing Compress Tx Pressure periods Decompress Decompress Begins Reached (leave unused spaces Begins Ends blank) Chamber Pressure (ATA) 1 2 - - - - - - 2 1 Clock Time (24 hr) 08:09 08:20 - - - - - - 09:49 10:00 Treatment Length: 111 (minutes) Treatment Segments: 4 Capillary Blood Glucose Pre Capillary Blood Glucose (mg/dl): Post Capillary Blood Glucose (mg/dl): Vital Signs Capillary Blood Glucose Reference Range: 80 - 120 mg / dl HBO Diabetic Blood Glucose Intervention Range: <131 mg/dl or >249 mg/dl Time Vitals Blood Respiratory Capillary Blood Glucose Pulse Action Type: Pulse: Temperature: Taken: Pressure: Rate: Glucose (mg/dl): Meter #: Oximetry (%) Taken: Pre 08:07 154/82 84 18 98.4 Post 10:06 138/86 64 16 98.4 Treatment Response Treatment Completion Status: Treatment Completed without Adverse Event [name], [name] ([id number]) Markasia Carrol Notes No concerns with treatment given HBO Attestation I certify that  I supervised this HBO treatment in accordance with Medicare guidelines. A trained Yes emergency response team is readily available per hospital policies and procedures. Continue HBOT as ordered. Yes Electronic Signature(s) Signed: 03/23/2016 4:28:03 PM By: Linton Ham MD Entered By: Linton Ham on 03/23/2016 15:44:54 [name], [name] ([id number]) -------------------------------------------------------------------------------- HBO Safety Checklist Details Patient Name: [name] Date of Service: 03/23/2016 8:00 AM Medical Record Number: [id number] Patient Account Number: [id number] Date of Birth/Sex: 1955-02-15 (61 y.o. Male) Treating RN: Montey Hora Primary Care Allen Basista: Annye Asa Other Clinician: Jacqulyn Bath Referring Tiarra Anastacio: Annye Asa Treating Milani Lowenstein/Extender: Tito Dine in Treatment: 2 HBO Safety Checklist Items Safety Checklist Consent Form Signed Patient voided / foley secured and emptied When did you last eato 03/22/16 Last dose of injectable or oral agent n/a NA Ostomy pouch emptied and vented if applicable NA All implantable devices assessed, documented and approved NA Intravenous access site secured and place Valuables secured Linens and cotton and cotton/polyester blend (less than 51% polyester) Personal oil-based products / skin lotions / body lotions removed NA Wigs or hairpieces removed NA Smoking or tobacco materials removed Books / newspapers / magazines / loose paper removed Cologne, aftershave, perfume and deodorant removed Jewelry removed (may wrap wedding band) Make-up removed Hair care products removed Battery operated devices (external) removed Heating patches and chemical warmers removed NA Titanium eyewear removed NA Nail polish cured greater than 10 hours NA Casting material cured greater than 10 hours NA Hearing aids removed Loose dentures or partials removed NA Prosthetics have been  removed Patient demonstrates correct use of air break device (if applicable) Patient concerns have been addressed Patient grounding bracelet on and cord attached to chamber Specifics for Inpatients (complete in addition to above) Medication sheet sent with patient Intravenous medications needed or due during therapy sent with patient [name], [name] ([id number]) Drainage tubes (e.g. nasogastric  tube or chest tube secured and vented) Endotracheal or Tracheotomy tube secured Cuff deflated of air and inflated with saline Airway suctioned Electronic Signature(s) Signed: 03/23/2016 4:10:27 PM By: Montey Hora Entered By: Montey Hora on 03/23/2016 08:32:52

## 2016-03-24 NOTE — Progress Notes (Signed)
[name], [name] ([id number]) Visit Report for 03/23/2016 Arrival Information Details Patient Name: [name], [name] Date of Service: 03/23/2016 8:00 AM Medical Record Number: [id number] Patient Account Number: [id number] Date of Birth/Sex: 1955/12/14 (61 y.o. Male) Treating RN: Montey Hora Primary Care Azora Bonzo: Annye Asa Other Clinician: Jacqulyn Bath Referring Selwyn Reason: Annye Asa Treating Ossiel Marchio/Extender: Tito Dine in Treatment: 2 Visit Information History Since Last Visit Added or deleted any medications: No Patient Arrived: Ambulatory Any new allergies or adverse reactions: No Arrival Time: 08:00 Had a fall or experienced change in No Accompanied By: self activities of daily living that may affect Transfer Assistance: None risk of falls: Patient Identification Verified: Yes Signs or symptoms of abuse/neglect since last No Secondary Verification Process Yes visito Completed: Hospitalized since last visit: No Patient Requires Transmission-Based No Pain Present Now: No Precautions: Patient Has Alerts: No Electronic Signature(s) Signed: 03/23/2016 4:10:27 PM By: Montey Hora Entered By: Montey Hora on 03/23/2016 08:22:26 [name] ([id number]) -------------------------------------------------------------------------------- Encounter Discharge Information Details Patient Name: [name] Date of Service: 03/23/2016 8:00 AM Medical Record Number: [id number] Patient Account Number: [id number] Date of Birth/Sex: [birth year] (61 y.o. Male) Treating RN: Montey Hora Primary Care Lacosta Hargan: Annye Asa Other Clinician: Jacqulyn Bath Referring Danie Diehl: Annye Asa Treating Tamora Huneke/Extender: Tito Dine in Treatment: 2 Encounter Discharge Information Items Discharge Pain Level: 0 Discharge Condition: Stable Ambulatory Status: Ambulatory Discharge Destination:  Home Private Transportation: Auto Accompanied By: self Schedule Follow-up Appointment: Yes Medication Reconciliation completed and No provided to Patient/Care Novelle Addair: Clinical Summary of Care: Notes Patient has an HBO treatment scheduled on 03/24/16 at 08:00 am. Electronic Signature(s) Signed: 03/23/2016 4:10:27 PM By: Montey Hora Entered By: Montey Hora on 03/23/2016 10:14:05 [name] ([id number]) -------------------------------------------------------------------------------- [place] Details Patient Name: [name] Date of Service: 03/23/2016 8:00 AM Medical Record Number: [id number] Patient Account Number: [id number] Date of Birth/Sex: 02/22/1955 (61 y.o. Male) Treating RN: Montey Hora Primary Care Carmelita Amparo: Annye Asa Other Clinician: Jacqulyn Bath Referring Juda Lajeunesse: Annye Asa Treating Glendi Mohiuddin/Extender: Ricard Dillon Weeks in Treatment: 2 Vital Signs Time Taken: 08:07 Temperature (F): 98.4 Height (in): 65 Pulse (bpm): 84 Weight (lbs): 168 Respiratory Rate (breaths/min): 18 Body Mass Index (BMI): 28 Blood Pressure (mmHg): 154/82 Reference Range: 80 - 120 mg / dl Electronic Signature(s) Signed: 03/23/2016 4:10:27 PM By: Montey Hora Entered By: Montey Hora on 03/23/2016 08:23:12

## 2016-03-25 ENCOUNTER — Encounter: Payer: Federal, State, Local not specified - PPO | Admitting: Surgery

## 2016-03-25 DIAGNOSIS — L598 Other specified disorders of the skin and subcutaneous tissue related to radiation: Secondary | ICD-10-CM | POA: Diagnosis not present

## 2016-03-25 DIAGNOSIS — Z8546 Personal history of malignant neoplasm of prostate: Secondary | ICD-10-CM | POA: Diagnosis not present

## 2016-03-25 DIAGNOSIS — N3289 Other specified disorders of bladder: Secondary | ICD-10-CM | POA: Diagnosis not present

## 2016-03-25 DIAGNOSIS — N3041 Irradiation cystitis with hematuria: Secondary | ICD-10-CM | POA: Diagnosis not present

## 2016-03-25 NOTE — Progress Notes (Signed)
[name], [name] ([id number]) Visit Report for 03/25/2016 HBO Details Patient Name: [name], [name] Date of Service: 03/25/2016 8:00 AM Medical Record Number: [id number] Patient Account Number: [id number] Date of Birth/Sex: [birth year] (61 y.o. 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 HBO Treatment Details Treatment Number: 16 Patient Type: Outpatient Chamber Type: Monoplace Chamber Serial #: [id number] Treatment Protocol: 2.0 ATA with 90 minutes oxygen, and no air breaks Treatment Details Compression Rate Down: 1.5 psi / minute De-Compression Rate Up: 1.5 psi / minute Air breaks and breathing Compress Tx Pressure periods Decompress Decompress Begins Reached (leave unused spaces Begins Ends blank) Chamber Pressure (ATA) 1 2 - - - - - - 2 1 Clock Time (24 hr) 08:05 08:17 - - - - - - 09:47 09:57 Treatment Length: 112 (minutes) Treatment Segments: 4 Capillary Blood Glucose Pre Capillary Blood Glucose (mg/dl): Post Capillary Blood Glucose (mg/dl): Vital Signs Capillary Blood Glucose Reference Range: 80 - 120 mg / dl HBO Diabetic Blood Glucose Intervention Range: <131 mg/dl or >249 mg/dl Time Vitals Blood Respiratory Capillary Blood Glucose Pulse Action Type: Pulse: Temperature: Taken: Pressure: Rate: Glucose (mg/dl): Meter #: Oximetry (%) Taken: Pre 07:55 130/82 78 16 98.4 Post 10:00 148/96 60 16 98.5 Treatment Response Treatment Completion Status: Treatment Completed without Adverse Event [name], [name] ([id number]) HBO Attestation I certify that I supervised this HBO treatment in accordance with Medicare  guidelines. A trained Yes emergency response team is readily available per hospital policies and procedures. Continue HBOT as ordered. Yes Electronic Signature(s) Signed: 03/25/2016 11:06:38 AM By: Christin Fudge MD, FACS Previous Signature: 03/25/2016 10:46:57 AM Version By: Lorine Bears RCP, RRT, CHT Previous Signature: 03/25/2016 8:11:41 AM Version By: Christin Fudge MD, FACS Entered By: Christin Fudge on 03/25/2016 11:06:38 [name], [name] ([id number]) -------------------------------------------------------------------------------- HBO Safety Checklist Details Patient Name: [name] Date of Service: 03/25/2016 8:00 AM Medical Record Number: [id number] Patient Account Number: [id number] Date of Birth/Sex: [birth year] (61 y.o. Male) Treating RN: Primary Care Latangela Mccomas: Annye Asa Other Clinician: Jacqulyn Bath Referring Naamah Boggess: Annye Asa Treating Shianne Zeiser/Extender: Frann Rider in Treatment: 3 HBO Safety Checklist Items Safety Checklist Consent Form Signed Patient voided / foley secured and emptied When did you last eato 03/24/16 pm Last dose of injectable or oral agent n/a NA Ostomy pouch emptied and vented if applicable NA All implantable devices assessed, documented and approved NA Intravenous access site secured and place Valuables secured Linens and cotton and cotton/polyester blend (less than 51% polyester) Personal oil-based products / skin lotions / body lotions removed NA Wigs or hairpieces removed NA Smoking or tobacco materials removed Books / newspapers / magazines / loose paper removed Cologne, aftershave, perfume and deodorant removed Jewelry removed (may wrap wedding band) NA Make-up removed Hair care products removed Battery operated devices (external) removed Heating patches and chemical warmers removed NA Titanium eyewear removed NA Nail polish cured greater than 10 hours NA Casting material cured greater than 10  hours NA Hearing aids removed Loose dentures or partials removed NA Prosthetics have been removed Patient demonstrates correct use of air break device (if applicable) Patient concerns have been addressed Patient grounding bracelet on and cord attached to chamber Specifics for Inpatients (complete in addition to above) Medication sheet sent with patient Intravenous medications needed  or due during therapy sent with patient [name], [name] ([id number]) Drainage tubes (e.g. nasogastric tube or chest tube secured and vented) Endotracheal or Tracheotomy tube secured Cuff deflated of air and inflated with saline Airway suctioned Electronic Signature(s) Signed: 03/25/2016 10:46:57 AM By: Lorine Bears RCP, RRT, CHT Entered By: Becky Sax, Amado Nash on 03/25/2016 08:08:32

## 2016-03-25 NOTE — Progress Notes (Signed)
AIDYNN, KRENN ([id number]) Visit Report for 03/25/2016 Arrival Information Details Patient Name: [name], [name] Date of Service: 03/25/2016 8:00 AM Medical Record Number: [id number] Patient Account Number: [id number] Date of Birth/Sex: 02/10/1955 (61 y.o. Male) Treating RN: Primary Care Bailynn Dyk: Annye Asa Other Clinician: Jacqulyn Bath Referring Zamyah Wiesman: Annye Asa Treating Lynx Goodrich/Extender: Frann Rider in Treatment: 3 Visit Information History Since Last Visit Added or deleted any medications: No Patient Arrived: Ambulatory Any new allergies or adverse reactions: No Arrival Time: 07:50 Had a fall or experienced change in No Accompanied By: self activities of daily living that may affect Transfer Assistance: None risk of falls: Patient Identification Verified: Yes Signs or symptoms of abuse/neglect since last No Secondary Verification Process Yes visito Completed: Hospitalized since last visit: No Patient Requires Transmission-Based No Pain Present Now: No Precautions: Patient Has Alerts: No Electronic Signature(s) Signed: 03/25/2016 10:46:57 AM By: Lorine Bears RCP, RRT, CHT Entered By: Lorine Bears on 03/25/2016 08:07:08 [name], [name] ([id number]) -------------------------------------------------------------------------------- Encounter Discharge Information Details Patient Name: [name] Date of Service: 03/25/2016 8:00 AM Medical Record Number: [id number] Patient Account Number: [id number] Date of Birth/Sex: [birth year] (61 y.o. Male) Treating RN: Primary Care Delmar Dondero: Annye Asa Other Clinician: Jacqulyn Bath Referring Shannan Slinker: Annye Asa Treating Manson Luckadoo/Extender: Frann Rider in Treatment: 3 Encounter Discharge Information Items Discharge Pain Level: 0 Discharge Condition: Stable Ambulatory Status: Ambulatory Discharge Destination:  Home Private Transportation: Auto Accompanied By: self Schedule Follow-up Appointment: No Medication Reconciliation completed and No provided to Patient/Care Zebulun Deman: Clinical Summary of Care: Notes Patient has an HBO treatment scheduled on 03/26/16 at 08:00 am. Electronic Signature(s) Signed: 03/25/2016 10:46:57 AM By: Lorine Bears RCP, RRT, CHT Entered By: Lorine Bears on 03/25/2016 10:46:18 [name], [name] ([id number]) -------------------------------------------------------------------------------- Vitals Details Patient Name: [name] Date of Service: 03/25/2016 8:00 AM Medical Record Number: [id number] Patient Account Number: [id number] Date of Birth/Sex: 1955/02/05 (61 y.o. Male) Treating RN: Primary Care Kymber Kosar: Annye Asa Other Clinician: Jacqulyn Bath Referring Monnica Saltsman: Annye Asa Treating Benetta Maclaren/Extender: Frann Rider in Treatment: 3 Vital Signs Time Taken: 07:55 Temperature (F): 98.4 Height (in): 65 Pulse (bpm): 78 Weight (lbs): 168 Respiratory Rate (breaths/min): 16 Body Mass Index (BMI): 28 Blood Pressure (mmHg): 130/82 Reference Range: 80 - 120 mg / dl Electronic Signature(s) Signed: 03/25/2016 10:46:57 AM By: Lorine Bears RCP, RRT, CHT Entered By: Lorine Bears on 03/25/2016 08:07:40

## 2016-03-26 ENCOUNTER — Encounter: Payer: Federal, State, Local not specified - PPO | Admitting: Surgery

## 2016-03-26 DIAGNOSIS — N3289 Other specified disorders of bladder: Secondary | ICD-10-CM | POA: Diagnosis not present

## 2016-03-26 DIAGNOSIS — N3041 Irradiation cystitis with hematuria: Secondary | ICD-10-CM | POA: Diagnosis not present

## 2016-03-26 DIAGNOSIS — Z8546 Personal history of malignant neoplasm of prostate: Secondary | ICD-10-CM | POA: Diagnosis not present

## 2016-03-26 DIAGNOSIS — L598 Other specified disorders of the skin and subcutaneous tissue related to radiation: Secondary | ICD-10-CM | POA: Diagnosis not present

## 2016-03-27 NOTE — Progress Notes (Signed)
[name], [name] ([id number]) Visit Report for 03/26/2016 HBO Details Patient Name: [name], [name] Date of Service: 03/26/2016 8:00 AM Medical Record Number: [id number] Patient Account Number: [id number] Date of Birth/Sex: 17-Jul-1955 (61 y.o. 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 HBO Treatment Details Treatment Number: 17 Patient Type: Outpatient Chamber Type: Monoplace Chamber Serial #: [id number] Treatment Protocol: 2.0 ATA with 90 minutes oxygen, and no air breaks Treatment Details Compression Rate Down: 1.5 psi / minute De-Compression Rate Up: 1.5 psi / minute Air breaks and breathing Compress Tx Pressure periods Decompress Decompress Begins Reached (leave unused spaces Begins Ends blank) Chamber Pressure (ATA) 1 2 - - - - - - 2 1 Clock Time (24 hr) 08:06 08:18 - - - - - - 09:48 09:58 Treatment Length: 112 (minutes) Treatment Segments: 4 Capillary Blood Glucose Pre Capillary Blood Glucose (mg/dl): Post Capillary Blood Glucose (mg/dl): Vital Signs Capillary Blood Glucose Reference Range: 80 - 120 mg / dl HBO Diabetic Blood Glucose Intervention Range: <131 mg/dl or >249 mg/dl Time Vitals Blood Respiratory Capillary Blood Glucose Pulse Action Type: Pulse: Temperature: Taken: Pressure: Rate: Glucose (mg/dl): Meter #: Oximetry (%) Taken: Pre 08:02 132/90 60 16 98.3 Post 10:00 124/90 60 16 98.3 Treatment Response Treatment Completion Status: Treatment Completed without Adverse Event Wachs, English ([id number]) HBO Attestation I certify that I supervised this HBO treatment in accordance with Medicare  guidelines. A trained Yes emergency response team is readily available per hospital policies and procedures. Continue HBOT as ordered. Yes Electronic Signature(s) Signed: 03/26/2016 11:38:01 AM By: Christin Fudge MD, FACS Previous Signature: 03/26/2016 11:37:23 AM Version By: Lorine Bears RCP, RRT, CHT Entered By: Christin Fudge on 03/26/2016 11:38:01 [place], Jarome ([id number]) -------------------------------------------------------------------------------- HBO Safety Checklist Details Patient Name: [name] Date of Service: 03/26/2016 8:00 AM Medical Record Number: [id number] Patient Account Number: [id number] Date of Birth/Sex: Jan 31, 1955 (61 y.o. Male) Treating RN: Primary Care Emeric Novinger: Annye Asa Other Clinician: Jacqulyn Bath Referring Morley Gaumer: Annye Asa Treating Baneen Wieseler/Extender: Frann Rider in Treatment: 3 HBO Safety Checklist Items Safety Checklist Consent Form Signed Patient voided / foley secured and emptied When did you last eato 03/25/16 pm Last dose of injectable or oral agent n/a NA Ostomy pouch emptied and vented if applicable NA All implantable devices assessed, documented and approved NA Intravenous access site secured and place Valuables secured Linens and cotton and cotton/polyester blend (less than 51% polyester) Personal oil-based products / skin lotions / body lotions removed NA Wigs or hairpieces removed NA Smoking or tobacco materials removed Books / newspapers / magazines / loose paper removed Cologne, aftershave, perfume and deodorant removed Jewelry removed (may wrap wedding band) NA Make-up removed Hair care products removed Battery operated devices (external) removed Heating patches and chemical warmers removed NA Titanium eyewear removed NA Nail polish cured greater than 10 hours NA Casting material cured greater than 10 hours NA Hearing aids removed Loose dentures or partials removed NA  Prosthetics have been removed Patient demonstrates correct use of air break device (if applicable) Patient concerns have been addressed Patient grounding bracelet on and cord attached to chamber Specifics for Inpatients (complete in addition to above) Medication sheet sent with patient Intravenous medications needed or due during therapy sent with patient LARMON, Davaun ([id number]) Drainage  tubes (e.g. nasogastric tube or chest tube secured and vented) Endotracheal or Tracheotomy tube secured Cuff deflated of air and inflated with saline Airway suctioned Electronic Signature(s) Signed: 03/26/2016 11:37:23 AM By: Lorine Bears RCP, RRT, CHT Entered By: Lorine Bears on 03/26/2016 08:19:43

## 2016-03-27 NOTE — Progress Notes (Signed)
BRAXTIN, BAMBA ([id number]) Visit Report for 03/24/2016 Arrival Information Details Patient Name: [name], [name] Date of Service: 03/24/2016 8:00 AM Medical Record Number: [id number] Patient Account Number: [id number] Date of Birth/Sex: 03/15/1955 (61 y.o. Male) Treating RN: Cornell Barman Primary Care Gar Glance: Annye Asa Other Clinician: Jacqulyn Bath Referring Candence Sease: Annye Asa Treating Jef Futch/Extender: Tito Dine in Treatment: 3 Visit Information History Since Last Visit Added or deleted any medications: No Patient Arrived: Ambulatory Any new allergies or adverse reactions: No Arrival Time: 07:58 Had a fall or experienced change in No Accompanied By: self activities of daily living that may affect Transfer Assistance: None risk of falls: Patient Identification Verified: Yes Signs or symptoms of abuse/neglect since last No Secondary Verification Process Yes visito Completed: Hospitalized since last visit: No Patient Requires Transmission-Based No Pain Present Now: No Precautions: Patient Has Alerts: No Electronic Signature(s) Signed: 03/26/2016 5:06:34 PM By: Gretta Cool, RN, BSN, Kim RN, BSN Entered By: Gretta Cool, RN, BSN, Kim on 03/24/2016 08:19:34 Linton Rump, Desmond ([id number]) -------------------------------------------------------------------------------- Encounter Discharge Information Details Patient Name: [name] Date of Service: 03/24/2016 8:00 AM Medical Record Number: [id number] Patient Account Number: [id number] Date of Birth/Sex: 08-18-1955 (61 y.o. Male) Treating RN: Cornell Barman Primary Care Bettylee Feig: Annye Asa Other Clinician: Jacqulyn Bath Referring Patrick Sohm: Annye Asa Treating Donnae Michels/Extender: Tito Dine in Treatment: 3 Encounter Discharge Information Items Discharge Pain Level: 0 Discharge Condition: Stable Ambulatory Status: Ambulatory Discharge Destination:  Home Private Transportation: Auto Accompanied By: self Schedule Follow-up Appointment: Yes Medication Reconciliation completed and Yes provided to Patient/Care Danniela Mcbrearty: Clinical Summary of Care: Electronic Signature(s) Signed: 03/26/2016 5:06:34 PM By: Gretta Cool, RN, BSN, Kim RN, BSN Entered By: Gretta Cool, RN, BSN, Kim on 03/24/2016 10:13:28 [name] ([id number]) -------------------------------------------------------------------------------- Patient/Caregiver Education Details Patient Name: [name] Date of Service: 03/24/2016 8:00 AM Medical Record Patient Account Number: [id number] Number: Treating RN: Cornell Barman July 06, 1955 (60 y.o. Other Clinician: Jacqulyn Bath Date of Birth/Gender: Male) Treating Linton Ham Primary Care Physician: Annye Asa Physician/Extender: G Referring Physician: Letha Cape in Treatment: 3 Education Assessment Education Provided To: Patient Education Topics Provided Hyperbaric Oxygenation: Handouts: Hyperbaric Oxygen Methods: Explain/Verbal Responses: State content correctly Electronic Signature(s) Signed: 03/26/2016 5:06:34 PM By: Gretta Cool, RN, BSN, Kim RN, BSN Entered By: Gretta Cool, RN, BSN, Kim on 03/24/2016 10:12:40 [name] ([id number]) -------------------------------------------------------------------------------- [place] Details Patient Name: [name] Date of Service: 03/24/2016 8:00 AM Medical Record Number: [id number] Patient Account Number: [id number] Date of Birth/Sex: 06-Jul-1955 (61 y.o. Male) Treating RN: Cornell Barman Primary Care Vasiliy Mccarry: Annye Asa Other Clinician: Jacqulyn Bath Referring Shelby Peltz: Annye Asa Treating Wali Reinheimer/Extender: Tito Dine in Treatment: 3 Vital Signs Time Taken: 08:19 Temperature (F): 98.3 Height (in): 65 Pulse (bpm): 80 Weight (lbs): 168 Respiratory Rate (breaths/min): 16 Body Mass Index (BMI): 28 Blood Pressure (mmHg):  128/88 Reference Range: 80 - 120 mg / dl Electronic Signature(s) Signed: 03/26/2016 5:06:34 PM By: Gretta Cool, RN, BSN, Kim RN, BSN Entered By: Gretta Cool, RN, BSN, Kim on 03/24/2016 08:20:15

## 2016-03-27 NOTE — Progress Notes (Signed)
[name], [name] ([id number]) Visit Report for 03/26/2016 Arrival Information Details Patient Name: [name], [name] Date of Service: 03/26/2016 8:00 AM Medical Record Number: [id number] Patient Account Number: [id number] Date of Birth/Sex: [birth year] (61 y.o. Male) Treating RN: Primary Care Tatem Fesler: Annye Asa Other Clinician: Jacqulyn Bath Referring Mishael Krysiak: Annye Asa Treating Tattianna Schnarr/Extender: Frann Rider in Treatment: 3 Visit Information History Since Last Visit Added or deleted any medications: No Patient Arrived: Ambulatory Any new allergies or adverse reactions: No Arrival Time: 07:55 Had a fall or experienced change in No Accompanied By: self activities of daily living that may affect Transfer Assistance: None risk of falls: Patient Identification Verified: Yes Signs or symptoms of abuse/neglect since last No Secondary Verification Process Yes visito Completed: Hospitalized since last visit: No Patient Requires Transmission-Based No Pain Present Now: No Precautions: Patient Has Alerts: No Electronic Signature(s) Signed: 03/26/2016 11:37:23 AM By: Lorine Bears RCP, RRT, CHT Entered By: Lorine Bears on 03/26/2016 08:16:44 [name], [name] ([id number]) -------------------------------------------------------------------------------- Encounter Discharge Information Details Patient Name: [name] Date of Service: 03/26/2016 8:00 AM Medical Record Number: [id number] Patient Account Number: [id number] Date of Birth/Sex: 1955-08-20 (61 y.o. Male) Treating RN: Primary Care Laiah Pouncey: Annye Asa Other Clinician: Jacqulyn Bath Referring Seiji Wiswell: Annye Asa Treating Jaleah Lefevre/Extender: Frann Rider in Treatment: 3 Encounter Discharge Information Items Discharge Pain Level: 0 Discharge Condition: Stable Ambulatory Status: Ambulatory Discharge Destination:  Home Private Transportation: Auto Accompanied By: self Schedule Follow-up Appointment: No Medication Reconciliation completed and No provided to Patient/Care Azaiah Licciardi: Clinical Summary of Care: Notes Patient has an HBO treatment scheduled on 03/29/16 at 08:00 am. Electronic Signature(s) Signed: 03/26/2016 11:37:23 AM By: Lorine Bears RCP, RRT, CHT Entered By: Lorine Bears on 03/26/2016 11:37:03 [name], [name] ([id number]) -------------------------------------------------------------------------------- Vitals Details Patient Name: [name] Date of Service: 03/26/2016 8:00 AM Medical Record Number: [id number] Patient Account Number: [id number] Date of Birth/Sex: Jun 05, 1955 (61 y.o. Male) Treating RN: Primary Care Larico Dimock: Annye Asa Other Clinician: Jacqulyn Bath Referring Yexalen Deike: Annye Asa Treating Shraga Custard/Extender: Frann Rider in Treatment: 3 Vital Signs Time Taken: 08:02 Temperature (F): 98.3 Height (in): 65 Pulse (bpm): 60 Weight (lbs): 168 Respiratory Rate (breaths/min): 16 Body Mass Index (BMI): 28 Blood Pressure (mmHg): 132/90 Reference Range: 80 - 120 mg / dl Electronic Signature(s) Signed: 03/26/2016 11:37:23 AM By: Lorine Bears RCP, RRT, CHT Entered By: Becky Sax, Amado Nash on 03/26/2016 08:18:09

## 2016-03-27 NOTE — Progress Notes (Signed)
**Note [name]-Identified via Obfuscation** [name] ([id number]) Visit Report for 03/24/2016 HBO Details Patient Name: [name], [name] Date of Service: 03/24/2016 8:00 AM Medical Record Number: [id number] Patient Account Number: [id number] Date of Birth/Sex: 1955-11-01 (61 y.o. 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 HBO Treatment Details Treatment Number: 15 Patient Type: Outpatient Chamber Type: Monoplace Chamber Serial #: [id number] Treatment Protocol: 2.0 ATA with 90 minutes oxygen, and no air breaks Treatment Details Compression Rate Down: 1.5 psi / minute [name]-Compression Rate Up: 1.5 psi / minute Air breaks and breathing Compress Tx Pressure periods Decompress Decompress Begins Reached (leave unused spaces Begins Ends blank) Chamber Pressure (ATA) 1 2 - - - - - - 2 1 Clock Time (24 hr) 08:03 08:18 - - - - - - 09:49 10:01 Treatment Length: 118 (minutes) Treatment Segments: 4 Capillary Blood Glucose Pre Capillary Blood Glucose (mg/dl): Post Capillary Blood Glucose (mg/dl): Vital Signs Capillary Blood Glucose Reference Range: 80 - 120 mg / dl HBO Diabetic Blood Glucose Intervention Range: <131 mg/dl or >249 mg/dl Time Vitals Blood Respiratory Capillary Blood Glucose Pulse Action Type: Pulse: Temperature: Taken: Pressure: Rate: Glucose (mg/dl): Meter #: Oximetry (%) Taken: Pre 08:19 128/88 80 16 98.3 Post 10:05 150/100 88 16 98.2 Treatment Response [name] ([id number]) Treatment Toleration: Treatment Treatment Completed without Adverse Event Completion Status: [name] Notes no concerns with treatment  given HBO Attestation I certify that I supervised this HBO treatment in accordance with Medicare guidelines. A trained Yes emergency response team is readily available per hospital policies and procedures. Continue HBOT as ordered. Yes Electronic Signature(s) Signed: 03/24/2016 4:37:19 PM By: Linton Ham MD Entered By: Linton Ham on 03/24/2016 11:47:41 [name] ([id number]) -------------------------------------------------------------------------------- HBO Safety Checklist Details Patient Name: [name] Date of Service: 03/24/2016 8:00 AM Medical Record Number: [id number] Patient Account Number: [id number] Date of Birth/Sex: Jan 22, 1955 (61 y.o. Male) Treating RN: Cornell Barman Primary Care [name]: Annye Asa Other Clinician: Jacqulyn Bath Referring [name]: Annye Asa Treating [name]: Tito Dine in Treatment: 3 HBO Safety Checklist Items Safety Checklist Consent Form Signed Patient voided / foley secured and emptied When did you last eato am Last dose of injectable or oral agent na NA Ostomy pouch emptied and vented if applicable NA All implantable devices assessed, documented and approved NA Intravenous access site secured and place Valuables secured Linens and cotton and cotton/polyester blend (less than 51% polyester) Personal oil-based products / skin lotions / body lotions removed NA Wigs or hairpieces removed Smoking or tobacco materials removed NA Books / newspapers / magazines / loose paper removed NA Cologne, aftershave, perfume and deodorant removed NA Jewelry removed (may wrap wedding band) NA Make-up removed NA Hair care products removed NA Battery operated devices (external) removed NA Heating patches and chemical warmers removed NA Titanium eyewear removed NA Nail polish cured greater than 10 hours NA Casting material cured greater than 10 hours NA Hearing aids removed Loose dentures or partials  removed NA Prosthetics have been removed Patient demonstrates correct use of air break device (if applicable) Patient concerns have been addressed Patient grounding bracelet on and cord attached to chamber Specifics for Inpatients (complete in addition to above) Medication sheet sent with patient Intravenous medications needed or due during therapy sent  with patient [name] ([id number]) Drainage tubes (e.g. nasogastric tube or chest tube secured and vented) Endotracheal or Tracheotomy tube secured Cuff deflated of air and inflated with saline Airway suctioned Electronic Signature(s) Signed: 03/26/2016 5:06:34 PM By: Gretta Cool, RN, BSN, Kim RN, BSN Entered By: Gretta Cool, RN, BSN, Kim on 03/24/2016 08:21:45

## 2016-03-29 ENCOUNTER — Encounter: Payer: Federal, State, Local not specified - PPO | Admitting: Surgery

## 2016-03-29 DIAGNOSIS — Z8546 Personal history of malignant neoplasm of prostate: Secondary | ICD-10-CM | POA: Diagnosis not present

## 2016-03-29 DIAGNOSIS — L598 Other specified disorders of the skin and subcutaneous tissue related to radiation: Secondary | ICD-10-CM | POA: Diagnosis not present

## 2016-03-29 DIAGNOSIS — N3289 Other specified disorders of bladder: Secondary | ICD-10-CM | POA: Diagnosis not present

## 2016-03-29 DIAGNOSIS — N3041 Irradiation cystitis with hematuria: Secondary | ICD-10-CM | POA: Diagnosis not present

## 2016-03-29 NOTE — Progress Notes (Signed)
[name] ([id number]) Visit Report for 03/29/2016 Arrival Information Details Patient Name: [name], [name] Date of Service: 03/29/2016 8:00 AM Medical Record Number: [id number] Patient Account Number: [id number] Date of Birth/Sex: 1955/09/29 (61 y.o. Male) Treating RN: Primary Care Kanon Novosel: Annye Asa Other Clinician: Jacqulyn Bath Referring Anwar Crill: Annye Asa Treating Kuper Rennels/Extender: Frann Rider in Treatment: 3 Visit Information History Since Last Visit Added or deleted any medications: No Patient Arrived: Ambulatory Any new allergies or adverse reactions: No Arrival Time: 08:05 Signs or symptoms of abuse/neglect since last No Accompanied By: self visito Transfer Assistance: None Hospitalized since last visit: No Patient Identification Verified: Yes Pain Present Now: No Secondary Verification Process Yes Completed: Patient Requires Transmission-Based No Precautions: Patient Has Alerts: No Electronic Signature(s) Signed: 03/29/2016 10:44:28 AM By: Lorine Bears RCP, RRT, CHT Entered By: Lorine Bears on 03/29/2016 08:24:49 [name] ([id number]) -------------------------------------------------------------------------------- Encounter Discharge Information Details Patient Name: [name] Date of Service: 03/29/2016 8:00 AM Medical Record Number: [id number] Patient Account Number: [id number] Date of Birth/Sex: October 15, 1955 (61 y.o. Male) Treating RN: Primary Care Ieisha Gao: Annye Asa Other Clinician: Jacqulyn Bath Referring Conya Ellinwood: Annye Asa Treating Karmello Abercrombie/Extender: Frann Rider in Treatment: 3 Encounter Discharge Information Items Discharge Pain Level: 0 Discharge Condition: Stable Ambulatory Status: Ambulatory Discharge Destination: Home Private Transportation: Auto Accompanied By: self Schedule Follow-up Appointment: No Medication Reconciliation completed  and No provided to Patient/Care Kaitlinn Iversen: Clinical Summary of Care: Notes Patient has an HBO treatment scheduled on 03/30/16 at 08:00 am. Electronic Signature(s) Signed: 03/29/2016 10:44:28 AM By: Lorine Bears RCP, RRT, CHT Entered By: Lorine Bears on 03/29/2016 10:44:13 [name] ([id number]) -------------------------------------------------------------------------------- Vitals Details Patient Name: [name] Date of Service: 03/29/2016 8:00 AM Medical Record Number: [id number] Patient Account Number: [id number] Date of Birth/Sex: March 20, 1955 (61 y.o. Male) Treating RN: Primary Care Rokia Bosket: Annye Asa Other Clinician: Jacqulyn Bath Referring Merelyn Klump: Annye Asa Treating Areeba Sulser/Extender: Frann Rider in Treatment: 3 Vital Signs Time Taken: 08:05 Temperature (F): 98.0 Height (in): 65 Pulse (bpm): 72 Weight (lbs): 168 Respiratory Rate (breaths/min): 16 Body Mass Index (BMI): 28 Blood Pressure (mmHg): 142/96 Reference Range: 80 - 120 mg / dl Electronic Signature(s) Signed: 03/29/2016 10:44:28 AM By: Lorine Bears RCP, RRT, CHT Entered By: Becky Sax, Amado Nash on 03/29/2016 08:25:43

## 2016-03-29 NOTE — Progress Notes (Signed)
DAISY, MCNEEL ([id number]) Visit Report for 03/29/2016 HBO Details Patient Name: SHUNSUKE, GRANZOW Date of Service: 03/29/2016 8:00 AM Medical Record Number: [id number] Patient Account Number: [id number] Date of Birth/Sex: September 13, 1955 (61 y.o. 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 HBO Treatment Details Treatment Number: 18 Patient Type: Outpatient Chamber Type: Monoplace Chamber Serial #: [id number] Treatment Protocol: 2.0 ATA with 90 minutes oxygen, and no air breaks Treatment Details Compression Rate Down: 1.5 psi / minute De-Compression Rate Up: 1.5 psi / minute Air breaks and breathing Compress Tx Pressure periods Decompress Decompress Begins Reached (leave unused spaces Begins Ends blank) Chamber Pressure (ATA) 1 2 - - - - - - 2 1 Clock Time (24 hr) 08:21 08:33 - - - - - - 10:03 10:13 Treatment Length: 112 (minutes) Treatment Segments: 4 Capillary Blood Glucose Pre Capillary Blood Glucose (mg/dl): Post Capillary Blood Glucose (mg/dl): Vital Signs Capillary Blood Glucose Reference Range: 80 - 120 mg / dl HBO Diabetic Blood Glucose Intervention Range: <131 mg/dl or >249 mg/dl Time Vitals Blood Respiratory Capillary Blood Glucose Pulse Action Type: Pulse: Temperature: Taken: Pressure: Rate: Glucose (mg/dl): Meter #: Oximetry (%) Taken: Pre 08:05 142/96 72 16 98 Post 10:15 164/98 72 16 98.4 Treatment Response Treatment Completion Status: Treatment Completed without Adverse Event Abel, Jahan ([id number]) HBO Attestation I certify that I supervised this HBO treatment in accordance with Medicare  guidelines. A trained Yes emergency response team is readily available per hospital policies and procedures. Continue HBOT as ordered. Yes Electronic Signature(s) Signed: 03/29/2016 10:47:12 AM By: Christin Fudge MD, FACS Previous Signature: 03/29/2016 10:44:28 AM Version By: Lorine Bears RCP, RRT, CHT Entered By: Christin Fudge on 03/29/2016 10:47:12 Rynders, Joseguadalupe ([id number]) -------------------------------------------------------------------------------- HBO Safety Checklist Details Patient Name: [name] Date of Service: 03/29/2016 8:00 AM Medical Record Number: [id number] Patient Account Number: [id number] Date of Birth/Sex: [birth year] (61 y.o. Male) Treating RN: Primary Care Domingue Coltrain: Annye Asa Other Clinician: Jacqulyn Bath Referring Lauri Purdum: Annye Asa Treating Michail Boyte/Extender: Frann Rider in Treatment: 3 HBO Safety Checklist Items Safety Checklist Consent Form Signed Patient voided / foley secured and emptied When did you last eato 03/28/16 Last dose of injectable or oral agent n/a NA Ostomy pouch emptied and vented if applicable NA All implantable devices assessed, documented and approved NA Intravenous access site secured and place Valuables secured Linens and cotton and cotton/polyester blend (less than 51% polyester) Personal oil-based products / skin lotions / body lotions removed NA Wigs or hairpieces removed NA Smoking or tobacco materials removed Books / newspapers / magazines / loose paper removed Cologne, aftershave, perfume and deodorant removed Jewelry removed (may wrap wedding band) NA Make-up removed Hair care products removed Battery operated devices (external) removed Heating patches and chemical warmers removed NA Titanium eyewear removed NA Nail polish cured greater than 10 hours NA Casting material cured greater than 10 hours NA Hearing aids removed Loose dentures or partials removed NA  Prosthetics have been removed Patient demonstrates correct use of air break device (if applicable) Patient concerns have been addressed Patient grounding bracelet on and cord attached to chamber Specifics for Inpatients (complete in addition to above) Medication sheet sent with patient Intravenous medications needed or due during therapy sent with patient HIRSCH, Raydell ([id number]) Drainage tubes (  e.g. nasogastric tube or chest tube secured and vented) Endotracheal or Tracheotomy tube secured Cuff deflated of air and inflated with saline Airway suctioned Electronic Signature(s) Signed: 03/29/2016 10:44:28 AM By: Lorine Bears RCP, RRT, CHT Entered By: Becky Sax, Amado Nash on 03/29/2016 08:29:18

## 2016-03-30 ENCOUNTER — Encounter: Payer: Federal, State, Local not specified - PPO | Admitting: Internal Medicine

## 2016-03-30 DIAGNOSIS — N3041 Irradiation cystitis with hematuria: Secondary | ICD-10-CM | POA: Diagnosis not present

## 2016-03-30 DIAGNOSIS — L598 Other specified disorders of the skin and subcutaneous tissue related to radiation: Secondary | ICD-10-CM | POA: Diagnosis not present

## 2016-03-30 DIAGNOSIS — Z8546 Personal history of malignant neoplasm of prostate: Secondary | ICD-10-CM | POA: Diagnosis not present

## 2016-03-30 DIAGNOSIS — N3289 Other specified disorders of bladder: Secondary | ICD-10-CM | POA: Diagnosis not present

## 2016-03-30 NOTE — Progress Notes (Signed)
SHOICHI, MIELKE ([id number]) Visit Report for 03/30/2016 Arrival Information Details Patient Name: KASSIM, GUERTIN Date of Service: 03/30/2016 8:00 AM Medical Record Number: [id number] Patient Account Number: [id number] Date of Birth/Sex: 1955-06-23 (61 y.o. Male) Treating RN: Primary Care Salim Forero: Annye Asa Other Clinician: Jacqulyn Bath Referring Alaia Lordi: Annye Asa Treating Shadiyah Wernli/Extender: Tito Dine in Treatment: 3 Visit Information History Since Last Visit Added or deleted any medications: No Patient Arrived: Ambulatory Any new allergies or adverse reactions: No Arrival Time: 07:50 Had a fall or experienced change in No Accompanied By: self activities of daily living that may affect Transfer Assistance: None risk of falls: Patient Identification Verified: Yes Signs or symptoms of abuse/neglect since last No Secondary Verification Process Yes visito Completed: Hospitalized since last visit: No Patient Requires Transmission-Based No Pain Present Now: No Precautions: Patient Has Alerts: No Electronic Signature(s) Signed: 03/30/2016 10:24:17 AM By: Lorine Bears RCP, RRT, CHT Entered By: Lorine Bears on 03/30/2016 08:15:54 Senk, Amritpal ([id number]) -------------------------------------------------------------------------------- Encounter Discharge Information Details Patient Name: [name] Date of Service: 03/30/2016 8:00 AM Medical Record Number: [id number] Patient Account Number: [id number] Date of Birth/Sex: 05/05/1955 (61 y.o. Male) Treating RN: Primary Care Anajulia Leyendecker: Annye Asa Other Clinician: Jacqulyn Bath Referring Arzella Rehmann: Annye Asa Treating Ivyrose Hashman/Extender: Tito Dine in Treatment: 3 Encounter Discharge Information Items Discharge Pain Level: 0 Discharge Condition: Stable Ambulatory Status: Ambulatory Discharge Destination:  Home Private Transportation: Auto Accompanied By: self Schedule Follow-up Appointment: No Medication Reconciliation completed and No provided to Patient/Care Jennyfer Nickolson: Clinical Summary of Care: Notes Patient has an HBO treatment scheduled on 03/31/16 at 08:00 am. Electronic Signature(s) Signed: 03/30/2016 10:24:17 AM By: Lorine Bears RCP, RRT, CHT Entered By: Lorine Bears on 03/30/2016 10:24:00 Hawarden, Duward ([id number]) -------------------------------------------------------------------------------- Vitals Details Patient Name: [name] Date of Service: 03/30/2016 8:00 AM Medical Record Number: [id number] Patient Account Number: [id number] Date of Birth/Sex: 02-Aug-1955 (61 y.o. Male) Treating RN: Primary Care Zen Felling: Annye Asa Other Clinician: Jacqulyn Bath Referring Analiz Tvedt: Annye Asa Treating Jeanetta Alonzo/Extender: Ricard Dillon Weeks in Treatment: 3 Vital Signs Time Taken: 07:50 Temperature (F): 98.2 Height (in): 65 Pulse (bpm): 78 Weight (lbs): 168 Respiratory Rate (breaths/min): 16 Body Mass Index (BMI): 28 Blood Pressure (mmHg): 136/84 Reference Range: 80 - 120 mg / dl Electronic Signature(s) Signed: 03/30/2016 10:24:17 AM By: Lorine Bears RCP, RRT, CHT Entered By: Becky Sax, Amado Nash on 03/30/2016 08:16:49

## 2016-03-31 ENCOUNTER — Encounter: Payer: Federal, State, Local not specified - PPO | Admitting: Internal Medicine

## 2016-03-31 DIAGNOSIS — L598 Other specified disorders of the skin and subcutaneous tissue related to radiation: Secondary | ICD-10-CM | POA: Diagnosis not present

## 2016-03-31 DIAGNOSIS — N3041 Irradiation cystitis with hematuria: Secondary | ICD-10-CM | POA: Diagnosis not present

## 2016-03-31 DIAGNOSIS — N3289 Other specified disorders of bladder: Secondary | ICD-10-CM | POA: Diagnosis not present

## 2016-03-31 DIAGNOSIS — Z8546 Personal history of malignant neoplasm of prostate: Secondary | ICD-10-CM | POA: Diagnosis not present

## 2016-03-31 NOTE — Progress Notes (Signed)
[name], [name] ([id number]) Visit Report for 03/30/2016 HBO Details Patient Name: [name], CORONADO Date of Service: 03/30/2016 8:00 AM Medical Record Number: [id number] Patient Account Number: [id number] Date of Birth/Sex: 04-13-1955 (61 y.o. 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 HBO Treatment Details Treatment Number: 19 Patient Type: Outpatient Chamber Type: Monoplace Chamber Serial #: [id number] Treatment Protocol: 2.0 ATA with 90 minutes oxygen, and no air breaks Treatment Details Compression Rate Down: 1.5 psi / minute De-Compression Rate Up: 1.5 psi / minute Air breaks and breathing Compress Tx Pressure periods Decompress Decompress Begins Reached (leave unused spaces Begins Ends blank) Chamber Pressure (ATA) 1 2 - - - - - - 2 1 Clock Time (24 hr) 08:02 08:14 - - - - - - 09:44 09:54 Treatment Length: 112 (minutes) Treatment Segments: 4 Capillary Blood Glucose Pre Capillary Blood Glucose (mg/dl): Post Capillary Blood Glucose (mg/dl): Vital Signs Capillary Blood Glucose Reference Range: 80 - 120 mg / dl HBO Diabetic Blood Glucose Intervention Range: <131 mg/dl or >249 mg/dl Time Vitals Blood Respiratory Capillary Blood Glucose Pulse Action Type: Pulse: Temperature: Taken: Pressure: Rate: Glucose (mg/dl): Meter #: Oximetry (%) Taken: Pre 07:50 136/84 78 16 98.2 Post 09:55 152/98 72 16 98.4 Treatment Response Treatment Completion Status: Treatment Completed without Adverse Event Muscato, Jeshawn ([id number]) Ethel Meisenheimer Notes no concerns with treatment given HBO Attestation I certify that I supervised  this HBO treatment in accordance with Medicare guidelines. A trained Yes emergency response team is readily available per hospital policies and procedures. Continue HBOT as ordered. Yes Electronic Signature(s) Signed: 03/31/2016 7:58:45 AM By: Linton Ham MD Previous Signature: 03/30/2016 10:24:17 AM Version By: Lorine Bears RCP, RRT, CHT Entered By: Linton Ham on 03/30/2016 14:58:54 Stamos, Antonio ([id number]) -------------------------------------------------------------------------------- HBO Safety Checklist Details Patient Name: [name] Date of Service: 03/30/2016 8:00 AM Medical Record Number: [id number] Patient Account Number: [id number] Date of Birth/Sex: 1955/09/07 (61 y.o. Male) Treating RN: Primary Care Meli Faley: Annye Asa Other Clinician: Jacqulyn Bath Referring Kingsley Herandez: Annye Asa Treating Aubrey Blackard/Extender: Tito Dine in Treatment: 3 HBO Safety Checklist Items Safety Checklist Consent Form Signed Patient voided / foley secured and emptied When did you last eato 03/29/16 pm Last dose of injectable or oral agent n/a NA Ostomy pouch emptied and vented if applicable NA All implantable devices assessed, documented and approved NA Intravenous access site secured and place Valuables secured Linens and cotton and cotton/polyester blend (less than 51% polyester) Personal oil-based products / skin lotions / body lotions removed NA Wigs or hairpieces removed NA Smoking or tobacco materials removed Books / newspapers / magazines / loose paper removed Cologne, aftershave, perfume and deodorant removed Jewelry removed (may wrap wedding band) NA Make-up removed Hair care products removed Battery operated devices (external) removed Heating patches and chemical warmers removed NA Titanium eyewear removed NA Nail polish cured greater than 10 hours NA Casting material cured greater than 10 hours NA Hearing aids  removed Loose dentures or partials removed NA Prosthetics have been removed Patient demonstrates correct use of air break device (if applicable) Patient concerns have been addressed Patient grounding bracelet on and cord attached to chamber Specifics for Inpatients (complete in addition to above) Medication sheet sent with patient Intravenous medications needed or due during  therapy sent with patient LATIF, NAZARENO ([id number]) Drainage tubes (e.g. nasogastric tube or chest tube secured and vented) Endotracheal or Tracheotomy tube secured Cuff deflated of air and inflated with saline Airway suctioned Electronic Signature(s) Signed: 03/30/2016 10:24:17 AM By: Lorine Bears RCP, RRT, CHT Entered By: Lorine Bears on 03/30/2016 08:17:56

## 2016-04-01 ENCOUNTER — Encounter: Payer: Federal, State, Local not specified - PPO | Admitting: Surgery

## 2016-04-01 DIAGNOSIS — N3289 Other specified disorders of bladder: Secondary | ICD-10-CM | POA: Diagnosis not present

## 2016-04-01 DIAGNOSIS — L598 Other specified disorders of the skin and subcutaneous tissue related to radiation: Secondary | ICD-10-CM | POA: Diagnosis not present

## 2016-04-01 DIAGNOSIS — Z8546 Personal history of malignant neoplasm of prostate: Secondary | ICD-10-CM | POA: Diagnosis not present

## 2016-04-01 DIAGNOSIS — N3041 Irradiation cystitis with hematuria: Secondary | ICD-10-CM | POA: Diagnosis not present

## 2016-04-01 NOTE — Progress Notes (Signed)
FLEMON, KELTY ([id number]) Visit Report for 03/31/2016 Arrival Information Details Patient Name: WILLIE, PLAIN Date of Service: 03/31/2016 8:00 AM Medical Record Number: [id number] Patient Account Number: [id number] Date of Birth/Sex: May 20, 1955 (61 y.o. Male) Treating RN: Primary Care Aireanna Luellen: Annye Asa Other Clinician: Jacqulyn Bath Referring Nyomi Howser: Annye Asa Treating Fateh Kindle/Extender: Tito Dine in Treatment: 4 Visit Information History Since Last Visit Added or deleted any medications: No Patient Arrived: Ambulatory Any new allergies or adverse reactions: No Arrival Time: 07:50 Had a fall or experienced change in No Accompanied By: self activities of daily living that may affect Transfer Assistance: None risk of falls: Patient Identification Verified: Yes Signs or symptoms of abuse/neglect since last No Secondary Verification Process Yes visito Completed: Hospitalized since last visit: No Patient Requires Transmission-Based No Pain Present Now: No Precautions: Patient Has Alerts: No Electronic Signature(s) Signed: 03/31/2016 10:07:23 AM By: Lorine Bears RCP, RRT, CHT Entered By: Lorine Bears on 03/31/2016 08:08:40 Kreischer, Christopherjohn ([id number]) -------------------------------------------------------------------------------- Encounter Discharge Information Details Patient Name: [name] Date of Service: 03/31/2016 8:00 AM Medical Record Number: [id number] Patient Account Number: [id number] Date of Birth/Sex: Apr 09, 1955 (61 y.o. Male) Treating RN: Primary Care Armany Mano: Annye Asa Other Clinician: Jacqulyn Bath Referring Nakeyia Menden: Annye Asa Treating Tanish Sinkler/Extender: Tito Dine in Treatment: 4 Encounter Discharge Information Items Discharge Pain Level: 0 Discharge Condition: Stable Ambulatory Status: Ambulatory Discharge Destination:  Home Private Transportation: Auto Accompanied By: self Schedule Follow-up Appointment: No Medication Reconciliation completed and No provided to Patient/Care Luisangel Wainright: Clinical Summary of Care: Notes Patient has an HBO treatment scheduled on 04/01/16 at 08:00 am. Electronic Signature(s) Signed: 03/31/2016 10:07:23 AM By: Lorine Bears RCP, RRT, CHT Entered By: Lorine Bears on 03/31/2016 10:07:00 High[place] Holiday, Zakye ([id number]) -------------------------------------------------------------------------------- Vitals Details Patient Name: [name] Date of Service: 03/31/2016 8:00 AM Medical Record Number: [id number] Patient Account Number: [id number] Date of Birth/Sex: 1955-01-31 (61 y.o. Male) Treating RN: Primary Care Macrae Wiegman: Annye Asa Other Clinician: Jacqulyn Bath Referring Jamiere Gulas: Annye Asa Treating Aubrea Meixner/Extender: Ricard Dillon Weeks in Treatment: 4 Vital Signs Time Taken: 07:55 Temperature (F): 98.2 Height (in): 65 Pulse (bpm): 72 Weight (lbs): 168 Respiratory Rate (breaths/min): 16 Body Mass Index (BMI): 28 Blood Pressure (mmHg): 148/86 Reference Range: 80 - 120 mg / dl Electronic Signature(s) Signed: 03/31/2016 10:07:23 AM By: Lorine Bears RCP, RRT, CHT Entered By: Lorine Bears on 03/31/2016 08:09:56

## 2016-04-02 ENCOUNTER — Encounter: Payer: Federal, State, Local not specified - PPO | Admitting: Surgery

## 2016-04-02 DIAGNOSIS — Z8546 Personal history of malignant neoplasm of prostate: Secondary | ICD-10-CM | POA: Diagnosis not present

## 2016-04-02 DIAGNOSIS — N3041 Irradiation cystitis with hematuria: Secondary | ICD-10-CM | POA: Diagnosis not present

## 2016-04-02 DIAGNOSIS — L598 Other specified disorders of the skin and subcutaneous tissue related to radiation: Secondary | ICD-10-CM | POA: Diagnosis not present

## 2016-04-02 NOTE — Progress Notes (Addendum)
ONIX, JUMPER ([id number]) Visit Report for 03/31/2016 Arrival Information Details Patient Name: [name], [name] Date of Service: 03/31/2016 10:00 AM Medical Record Number: [id number] Patient Account Number: [id number] Date of Birth/Sex: 28-Oct-1955 (61 y.o. Male) Treating RN: Cornell Barman Primary Care Tymia Streb: Annye Asa Other Clinician: Referring Prentice Sackrider: Annye Asa Treating Odell Choung/Extender: Suella Grove in Treatment: 4 Visit Information History Since Last Visit Any new allergies or adverse reactions: No Patient Arrived: Ambulatory Had a fall or experienced change in No Arrival Time: 07:50 activities of daily living that may affect Accompanied By: self risk of falls: Transfer Assistance: None Signs or symptoms of abuse/neglect since last No Patient Identification Verified: Yes visito Secondary Verification Process Yes Hospitalized since last visit: No Completed: Pain Present Now: No Patient Requires Transmission-Based No Precautions: Patient Has Alerts: No Electronic Signature(s) Signed: 04/01/2016 10:18:58 AM By: Lorine Bears RCP, RRT, CHT Entered By: Lorine Bears on 03/31/2016 10:20:09 [name] ([id number]) -------------------------------------------------------------------------------- Encounter Discharge Information Details Patient Name: [name] Date of Service: 03/31/2016 10:00 AM Medical Record Number: [id number] Patient Account Number: [id number] Date of Birth/Sex: 1955-02-07 (61 y.o. Male) Treating RN: Cornell Barman Primary Care Shanequa Whitenight: Annye Asa Other Clinician: Referring Shuna Tabor: Annye Asa Treating Lambros Cerro/Extender: Tito Dine in Treatment: 4 Encounter Discharge Information Items Discharge Pain Level: 0 Discharge Condition: Stable Ambulatory Status: Ambulatory Discharge Destination: Home Private Transportation: Auto Accompanied By: self Schedule Follow-up Appointment:  Yes Medication Reconciliation completed and Yes provided to Patient/Care Darcey Cardy: Clinical Summary of Care: Electronic Signature(s) Signed: 03/31/2016 11:42:45 AM By: Gretta Cool, RN, BSN, Kim RN, BSN Entered By: Gretta Cool, RN, BSN, Kim on 03/31/2016 11:42:45 [place]ett, Javarus ([id number]) -------------------------------------------------------------------------------- Multi Wound Chart Details Patient Name: [name] Date of Service: 03/31/2016 10:00 AM Medical Record Number: [id number] Patient Account Number: [id number] Date of Birth/Sex: October 29, 1955 (61 y.o. Male) Treating RN: Cornell Barman Primary Care Cristabel Bicknell: Annye Asa Other Clinician: Referring Damoni Erker: Annye Asa Treating Raymie Giammarco/Extender: Suella Grove in Treatment: 4 Vital Signs Height(in): 65 Pulse(bpm): 72 Weight(lbs): 168 Blood Pressure 148/86 (mmHg): Body Mass Index(BMI): 28 Temperature(F): 98.2 Respiratory Rate 16 (breaths/min): Wound Assessments Treatment Notes Electronic Signature(s) Signed: 03/31/2016 2:38:45 PM By: Gretta Cool, RN, BSN, Kim RN, BSN Entered By: Gretta Cool, RN, BSN, Kim on 03/31/2016 09:50:26 [place], [place] ([id number]) -------------------------------------------------------------------------------- [place] Details Patient Name: [name] Date of Service: 03/31/2016 10:00 AM Medical Record Number: [id number] Patient Account Number: [id number] Date of Birth/Sex: 1955-09-07 (61 y.o. Male) Treating RN: Cornell Barman Primary Care Tykera Skates: Annye Asa Other Clinician: Referring Dominico Rod: Annye Asa Treating Virda Betters/Extender: Suella Grove in Treatment: 4 Active Inactive Electronic Signature(s) Signed: 05/04/2016 2:57:36 PM By: Gretta Cool RN, BSN, Kim RN, BSN Previous Signature: 03/31/2016 2:38:45 PM Version By: Gretta Cool RN, BSN, Kim RN, BSN Entered By: Gretta Cool, RN, BSN, Kim on 05/04/2016 14:57:36 Falcon, [name]  ([id number]) -------------------------------------------------------------------------------- Pain Assessment Details Patient Name: [name] Date of Service: 03/31/2016 10:00 AM Medical Record Number: [id number] Patient Account Number: [id number] Date of Birth/Sex: October 13, 1955 (61 y.o. Male) Treating RN: Cornell Barman Primary Care Pressley Barsky: Annye Asa Other Clinician: Referring Endre Coutts: Annye Asa Treating Ladana Chavero/Extender: Suella Grove in Treatment: 4 Active Problems Location of Pain Severity and Description of Pain Patient Has Paino No Site Locations With Dressing Change: No Pain Management and Medication Current Pain Management: Electronic Signature(s) Signed: 03/31/2016 2:38:45 PM By: Gretta Cool, RN, BSN, Kim RN, BSN Entered By: Gretta Cool, RN, BSN, Kim on 03/31/2016 09:48:22 [name] ([id number]) -------------------------------------------------------------------------------- Patient/Caregiver Education Details Patient Name: [name] Date of Service: 03/31/2016 10:00 AM Medical Record Number: [id number] Patient Account Number: [id number] Date of Birth/Gender:  1955/05/12 (61 y.o. Male) Treating RN: Cornell Barman Primary Care Physician: Annye Asa Other Clinician: Referring Physician: Annye Asa Treating Physician/Extender: Tito Dine in Treatment: 4 Education Assessment Education Provided To: Patient Education Topics Provided Hyperbaric Oxygenation: Handouts: Hyperbaric Oxygen, Other: continue therapy as prescribed Methods: Demonstration, Explain/Verbal Responses: State content correctly Electronic Signature(s) Signed: 03/31/2016 2:38:45 PM By: Gretta Cool, RN, BSN, Kim RN, BSN Entered By: Gretta Cool, RN, BSN, Kim on 03/31/2016 11:43:06 [place], Sylvia ([id number]) -------------------------------------------------------------------------------- [place] Details Patient Name: [name] Date of Service: 03/31/2016 10:00 AM Medical Record  Number: [id number] Patient Account Number: [id number] Date of Birth/Sex: [birth year] (61 y.o. Male) Treating RN: Cornell Barman Primary Care Kodah Maret: Annye Asa Other Clinician: Referring Tayna Smethurst: Annye Asa Treating Xavion Muscat/Extender: Suella Grove in Treatment: 4 Vital Signs Time Taken: 07:55 Temperature (F): 98.2 Height (in): 65 Pulse (bpm): 72 Weight (lbs): 168 Respiratory Rate (breaths/min): 16 Body Mass Index (BMI): 28 Blood Pressure (mmHg): 148/86 Reference Range: 80 - 120 mg / dl Electronic Signature(s) Signed: 03/31/2016 2:38:45 PM By: Gretta Cool, RN, BSN, Kim RN, BSN Entered By: Gretta Cool, RN, BSN, Kim on 03/31/2016 09:48:50

## 2016-04-02 NOTE — Progress Notes (Signed)
[name], [name] ([id number]) Visit Report for 04/01/2016 HBO Details Patient Name: [name], [name] Date of Service: 04/01/2016 8:00 AM Medical Record Number: [id number] Patient Account Number: [id number] Date of Birth/Sex: 1955/04/19 (61 y.o. 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 HBO Treatment Details Treatment Number: 21 Patient Type: Outpatient Chamber Type: Monoplace Chamber Serial #: [id number] Treatment Protocol: 2.0 ATA with 90 minutes oxygen, and no air breaks Treatment Details Compression Rate Down: 1.5 psi / minute De-Compression Rate Up: 1.5 psi / minute Air breaks and breathing Compress Tx Pressure periods Decompress Decompress Begins Reached (leave unused spaces Begins Ends blank) Chamber Pressure (ATA) 1 2 - - - - - - 2 1 Clock Time (24 hr) 08:06 08:16 - - - - - - 09:46 09:57 Treatment Length: 111 (minutes) Treatment Segments: 4 Capillary Blood Glucose Pre Capillary Blood Glucose (mg/dl): Post Capillary Blood Glucose (mg/dl): Vital Signs Capillary Blood Glucose Reference Range: 80 - 120 mg / dl HBO Diabetic Blood Glucose Intervention Range: <131 mg/dl or >249 mg/dl Time Vitals Blood Respiratory Capillary Blood Glucose Pulse Action Type: Pulse: Temperature: Taken: Pressure: Rate: Glucose (mg/dl): Meter #: Oximetry (%) Taken: Pre 07:55 128/84 72 16 98 Post 10:02 152/92 72 16 98.4 Treatment Response Treatment Completion Status: Treatment Completed without Adverse Event [name], [name] ([id number]) Electronic Signature(s) Signed: 04/01/2016 10:18:58 AM By: Lorine Bears RCP, RRT,  CHT Signed: 04/01/2016 3:13:18 PM By: Christin Fudge MD, FACS Previous Signature: 04/01/2016 10:10:41 AM Version By: Christin Fudge MD, FACS Entered By: Lorine Bears on 04/01/2016 10:17:29 [name], [name] ([id number]) -------------------------------------------------------------------------------- HBO Safety Checklist Details Patient Name: [name] Date of Service: 04/01/2016 8:00 AM Medical Record Number: [id number] Patient Account Number: [id number] Date of Birth/Sex: 02/16/1955 (61 y.o. Male) Treating RN: Primary Care Lucile Hillmann: Annye Asa Other Clinician: Jacqulyn Bath Referring Caleyah Jr: Annye Asa Treating Mililani Murthy/Extender: Frann Rider in Treatment: 4 HBO Safety Checklist Items Safety Checklist Consent Form Signed Patient voided / foley secured and emptied When did you last eato 03/31/16 Last dose of injectable or oral agent n/a NA Ostomy pouch emptied and vented if applicable NA All implantable devices assessed, documented and approved NA Intravenous access site secured and place Valuables secured Linens and cotton and cotton/polyester blend (less than 51% polyester) Personal oil-based products / skin lotions / body lotions removed NA Wigs or hairpieces removed NA Smoking or tobacco materials removed Books / newspapers / magazines / loose paper removed Cologne, aftershave, perfume and deodorant removed Jewelry removed (may wrap wedding band) NA Make-up removed Hair care products removed Battery operated devices (external) removed Heating patches and chemical warmers removed NA Titanium eyewear removed NA Nail polish cured greater than 10 hours NA Casting material cured greater than 10 hours NA Hearing aids removed Loose dentures or partials removed NA Prosthetics have been removed Patient demonstrates correct use of air break device (if applicable) Patient concerns have been addressed Patient grounding bracelet on and cord  attached to chamber Specifics for Inpatients (complete in addition to above) Medication sheet sent with patient Intravenous medications needed or due during therapy sent with patient [name], [name] ([id number]) Drainage tubes (e.g. nasogastric tube or chest tube secured and vented) Endotracheal or Tracheotomy tube secured Cuff deflated of air and inflated with saline Airway suctioned  Electronic Signature(s) Signed: 04/01/2016 10:18:58 AM By: Lorine Bears RCP, RRT, CHT Entered By: Lorine Bears on 04/01/2016 08:09:56

## 2016-04-02 NOTE — Progress Notes (Signed)
GRIFFON, HERBERG ([id number]) Visit Report for 03/31/2016 HBO Details Patient Name: [name], [name] Date of Service: 03/31/2016 8:00 AM Medical Record Number: [id number] Patient Account Number: [id number] Date of Birth/Sex: 09-Oct-1955 (61 y.o. 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 HBO Treatment Details Treatment Number: 20 Patient Type: Outpatient Chamber Type: Monoplace Chamber Serial #: [id number] Treatment Protocol: 2.0 ATA with 90 minutes oxygen, and no air breaks Treatment Details Compression Rate Down: 1.5 psi / minute De-Compression Rate Up: 1.5 psi / minute Air breaks and breathing Compress Tx Pressure periods Decompress Decompress Begins Reached (leave unused spaces Begins Ends blank) Chamber Pressure (ATA) 1 2 - - - - - - 2 1 Clock Time (24 hr) 08:07 08:19 - - - - - - 09:50 10:00 Treatment Length: 113 (minutes) Treatment Segments: 4 Capillary Blood Glucose Pre Capillary Blood Glucose (mg/dl): Post Capillary Blood Glucose (mg/dl): Vital Signs Capillary Blood Glucose Reference Range: 80 - 120 mg / dl HBO Diabetic Blood Glucose Intervention Range: <131 mg/dl or >249 mg/dl Time Vitals Blood Respiratory Capillary Blood Glucose Pulse Action Type: Pulse: Temperature: Taken: Pressure: Rate: Glucose (mg/dl): Meter #: Oximetry (%) Taken: Pre 07:55 148/86 72 16 98.2 Post 10:05 146/92 72 16 98.2 Treatment Response Treatment Completion Status: Treatment Completed without Adverse Event Bossier, Tyree ([id number]) Lorel Lembo Notes no concerns with treatment given HBO Attestation I certify that I supervised  this HBO treatment in accordance with Medicare guidelines. A trained Yes emergency response team is readily available per hospital policies and procedures. Continue HBOT as ordered. Yes Electronic Signature(s) Signed: 04/01/2016 5:50:35 AM By: Linton Ham MD Previous Signature: 03/31/2016 10:07:23 AM Version By: Lorine Bears RCP, RRT, CHT Entered By: Linton Ham on 03/31/2016 15:07:25 Hagarty, Sherod ([id number]) -------------------------------------------------------------------------------- HBO Safety Checklist Details Patient Name: [name] Date of Service: 03/31/2016 8:00 AM Medical Record Number: [id number] Patient Account Number: [id number] Date of Birth/Sex: 02-07-1955 (61 y.o. Male) Treating RN: Primary Care Haley Roza: Annye Asa Other Clinician: Jacqulyn Bath Referring Ryker Sudbury: Annye Asa Treating Kaysia Willard/Extender: Tito Dine in Treatment: 4 HBO Safety Checklist Items Safety Checklist Consent Form Signed Patient voided / foley secured and emptied When did you last eato 03/30/16 pm Last dose of injectable or oral agent n/a NA Ostomy pouch emptied and vented if applicable NA All implantable devices assessed, documented and approved NA Intravenous access site secured and place Valuables secured Linens and cotton and cotton/polyester blend (less than 51% polyester) Personal oil-based products / skin lotions / body lotions removed NA Wigs or hairpieces removed NA Smoking or tobacco materials removed Books / newspapers / magazines / loose paper removed Cologne, aftershave, perfume and deodorant removed Jewelry removed (may wrap wedding band) NA Make-up removed Hair care products removed Battery operated devices (external) removed Heating patches and chemical warmers removed NA Titanium eyewear removed NA Nail polish cured greater than 10 hours NA Casting material cured greater than 10 hours NA Hearing aids  removed Loose dentures or partials removed NA Prosthetics have been removed Patient demonstrates correct use of air break device (if applicable) Patient concerns have been addressed Patient grounding bracelet on and cord attached to chamber Specifics for Inpatients (complete in addition to above) Medication sheet sent with patient Intravenous medications needed or due during  therapy sent with patient ZAAHIR, PICKNEY ([id number]) Drainage tubes (e.g. nasogastric tube or chest tube secured and vented) Endotracheal or Tracheotomy tube secured Cuff deflated of air and inflated with saline Airway suctioned Electronic Signature(s) Signed: 03/31/2016 10:07:23 AM By: Lorine Bears RCP, RRT, CHT Entered By: Lorine Bears on 03/31/2016 08:10:45

## 2016-04-02 NOTE — Progress Notes (Signed)
TERIK, HAUGHEY ([id number]) Visit Report for 04/01/2016 Arrival Information Details Patient Name: [name], [name] Date of Service: 04/01/2016 8:00 AM Medical Record Number: [id number] Patient Account Number: [id number] Date of Birth/Sex: 1955-02-20 (61 y.o. Male) Treating RN: Primary Care Baraa Tubbs: Annye Asa Other Clinician: Jacqulyn Bath Referring Aayden Cefalu: Annye Asa Treating Lauryn Lizardi/Extender: Frann Rider in Treatment: 4 Visit Information History Since Last Visit Added or deleted any medications: No Patient Arrived: Ambulatory Any new allergies or adverse reactions: No Arrival Time: 07:50 Had a fall or experienced change in No Accompanied By: self activities of daily living that may affect Transfer Assistance: None risk of falls: Patient Identification Verified: Yes Signs or symptoms of abuse/neglect since last No Secondary Verification Process Yes visito Completed: Hospitalized since last visit: No Patient Requires Transmission-Based No Pain Present Now: No Precautions: Patient Has Alerts: No Electronic Signature(s) Signed: 04/01/2016 10:18:58 AM By: Lorine Bears RCP, RRT, CHT Entered By: Lorine Bears on 04/01/2016 08:07:47 [name], [name] ([id number]) -------------------------------------------------------------------------------- Encounter Discharge Information Details Patient Name: [name] Date of Service: 04/01/2016 8:00 AM Medical Record Number: [id number] Patient Account Number: [id number] Date of Birth/Sex: 1955-07-20 (61 y.o. Male) Treating RN: Primary Care Herve Haug: Annye Asa Other Clinician: Jacqulyn Bath Referring Veronica Fretz: Annye Asa Treating Laron Angelini/Extender: Frann Rider in Treatment: 4 Encounter Discharge Information Items Discharge Pain Level: 0 Discharge Condition: Stable Ambulatory Status: Ambulatory Discharge Destination:  Home Private Transportation: Auto Accompanied By: self Schedule Follow-up Appointment: No Medication Reconciliation completed and No provided to Patient/Care Desirre Eickhoff: Clinical Summary of Care: Notes Patient has an HBO treatment scheduled 04/02/16 at 08:00 am. Electronic Signature(s) Signed: 04/01/2016 10:18:58 AM By: Lorine Bears RCP, RRT, CHT Entered By: Lorine Bears on 04/01/2016 10:18:25 [name], [name] ([id number]) -------------------------------------------------------------------------------- Vitals Details Patient Name: [name] Date of Service: 04/01/2016 8:00 AM Medical Record Number: [id number] Patient Account Number: [id number] Date of Birth/Sex: May 02, 1955 (61 y.o. Male) Treating RN: Primary Care Azavier Creson: Annye Asa Other Clinician: Jacqulyn Bath Referring Sherrice Creekmore: Annye Asa Treating Rainbow Salman/Extender: Frann Rider in Treatment: 4 Vital Signs Time Taken: 07:55 Temperature (F): 98.0 Height (in): 65 Pulse (bpm): 72 Weight (lbs): 168 Respiratory Rate (breaths/min): 16 Body Mass Index (BMI): 28 Blood Pressure (mmHg): 128/84 Reference Range: 80 - 120 mg / dl Electronic Signature(s) Signed: 04/01/2016 10:18:58 AM By: Lorine Bears RCP, RRT, CHT Entered By: Becky Sax, Amado Nash on 04/01/2016 08:08:51

## 2016-04-02 NOTE — Progress Notes (Signed)
ALBEN, JEPSEN ([id number]) Visit Report for 03/31/2016 HPI Details Patient Name: [name], [name] Date of Service: 03/31/2016 10:00 AM Medical Record Number: [id number] Patient Account Number: [id number] Date of Birth/Sex: 1955-08-19 (61 y.o. Male) Treating RN: Primary Care Provider: Annye Asa Other Clinician: Referring Provider: Annye Asa Treating Provider/Extender: Ricard Dillon Weeks in Treatment: 4 History of Present Illness HPI Description: 08/26/15; this is a patient who is been referred from his urologist Dr. 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 The patient was diagnosed as having adenocarcinoma of the prostate T-2 B N0 M0 Gleason score of 7 in 2013. This was discovered based on a screening PSA of 7.5, the patient was not symptomatic at that time he tells me. He elected to to undergo combination therapy. He received 4500 cGy terminal beam radiation over 5 weeks followed by I/125 seed implantation. Total of 57 seeds were placed with a total mCi dosage of 19.9 mCi. He also received anti androgen therapy. The patient tells me he did quite well and really was asymptomatic until roughly 3-4 weeks ago when he developed acute urinary retention vomiting a trip to the emergency room. Apparently prior to the urinary retention he had passed a few small clots in his urine. He required a catheter placement on July 29. Bladder scan at that time showed greater than 500 cc of urine in his bladder. The catheter was removed however the patient could not void. He has since been doing in and out catheterizations. 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 Erythematous patches were noted within the bladder wall compatible with radiation cystitis. Urinalysis was obtained I don't have these results. The patient was started on Repaflo to help with the retention. He is  still having some frank bleeding this morning according to his wife. He feels as though his spontaneous voiding is improving however he is still doing when necessary in and out caths 09/17/15; the patient is seen today for follow-up visit in conjunction with hyperbaric oxygen treatment for late effect radiation cystitis. He states after starting hyperbarics last week I believe he had what he felt was improved urine flow. However over the weekend he had a problem. Staining on 4:00 he did an in and out catheter for 400 cc of clear urine. 4 hours later he didn't another in and out catheter for grossly bloody urine. Later that evening he had to void but he could not because of blood clots and ended up in the emergency room where he had a painful set of procedures to put a catheter in place ultimately done by urology. He now has a indwelling Foley catheter, still passing some bloody urine and some blood clots in the leg bag. He was able to show this to me today. He has a follow-up with his urologist on Monday 09/30/15; the patient is seen today in routine follow-up for his ongoing hyperbaric oxygen treatment for late effect radiation cystitis. He has had a difficult time of late being in the ER on 2 occasions on 9/17 with urinary retention. This was in the face of an ongoing Foley catheter. The catheter was irrigated. He saw Dr. Yves Dill his urologist on 9/19. He had a TURP clot evacuation and bladder fulguration most of the bleeding appears to been in the prosthetic urethra although there was several large clots also present in the bladder. There was also several areas of hemorrhagic cystitis along the posterior wall which were  fulgurated. He saw Dr. Yves Dill in follow-up yesterday. The Foley catheter was removed. He is able to void roughly 100 cc and then has 400 cc of urine via in and out catheterization. He has had no further bleeding. Feels HBO has made his spontaneous voiding improved 03/03/16; the  patient is referred by Dr. Boneta Lucks his urologist for review review of hemorrhagic radiation cystitis. [name], [name] ([id number]) His history is still essentially the same as above. 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 While being treated with hyperbarics he noted improvement in the hematuria but also the fact that he did not have to do in and out catheters anymore for urinary retention. He presented to Dr. Yves Dill on 02/26/16 with a recent history now of" flash" hematuria and he has passed a few clots. He is not complaining of dysuria hasn't any obstructive voiding symptoms. He is on Elmiron and Rapaflo. Elmiron was recently discontinued 03/31/16; the patient is seen today in conjunction with HBO. Dive #20. Doing well, voiding well with improvement in hematuria, initial stream about 1x per wk. No other voiding difficulties and not need to catheterize. Electronic Signature(s) Signed: 04/01/2016 5:50:35 AM By: Linton Ham MD Entered By: Linton Ham on 03/31/2016 13:08:18 [name] ([id number]) -------------------------------------------------------------------------------- Physical Exam Details Patient Name: [name] Date of Service: 03/31/2016 10:00 AM Medical Record Number: [id number] Patient Account Number: [id number] Date of Birth/Sex: 07/31/1955 (61 y.o. Male) Treating RN: Primary Care Provider: Annye Asa Other Clinician: Referring Provider: Annye Asa Treating Provider/Extender: Ricard Dillon Weeks in Treatment: 4 Constitutional Patient is hypertensive.. Pulse regular and within target range for patient.Marland Kitchen Respirations regular, non-labored and within target range.. Temperature is normal and within the target range for the patient.. Patient's appearance is neat and clean. Appears in no acute distress. Well nourished and well  developed.. Gastrointestinal (GI) no masses. No bladder distention. Electronic Signature(s) Signed: 04/01/2016 5:50:35 AM By: Linton Ham MD Entered By: Linton Ham on 03/31/2016 13:08:50 [name], [name] ([id number]) -------------------------------------------------------------------------------- Physician Orders Details Patient Name: [name] Date of Service: 03/31/2016 10:00 AM Medical Record Number: [id number] Patient Account Number: [id number] Date of Birth/Sex: [birth year] (61 y.o. Male) Treating RN: Cornell Barman Primary Care Provider: Annye Asa Other Clinician: Referring Provider: Annye Asa Treating Provider/Extender: Suella Grove in Treatment: 4 Verbal / Phone Orders: No Diagnosis Coding Hyperbaric Oxygen Therapy o Evaluate for HBO Therapy o Indication: - soft tissue radionecrosis o If appropriate for treatment, begin HBOT per protocol: o 2.0 ATA for 90 Minutes without Air Breaks o One treatment per day (delivered Monday through Friday unless otherwise specified in Special Instructions below): o Total # of Treatments: - 40 Electronic Signature(s) Signed: 03/31/2016 2:38:45 PM By: Gretta Cool, RN, BSN, Kim RN, BSN Entered By: Gretta Cool, RN, BSN, Kim on 03/31/2016 09:50:58 [name], [name] ([id number]) -------------------------------------------------------------------------------- Progress Note Details Patient Name: [name] Date of Service: 03/31/2016 10:00 AM Medical Record Number: [id number] Patient Account Number: [id number] Date of Birth/Sex: August 27, 1955 (61 y.o. Male) Treating RN: Primary Care Provider: Annye Asa Other Clinician: Referring Provider: Annye Asa Treating Provider/Extender: Ricard Dillon Weeks in Treatment: 4 Subjective History of Present Illness (HPI) 08/26/15; this is a patient who is been referred from his urologist Dr. Maryan Puls for consideration of hyperbaric oxygen for hemorrhagic cystitis  secondary to late effect radiation. The patient was diagnosed as having adenocarcinoma of the prostate T-2 B N0 M0 Gleason score of 7 in 2013. This was discovered based on a screening PSA of 7.5,  the patient was not symptomatic at that time he tells me. He elected to to undergo combination therapy. He received 4500 cGy terminal beam radiation over 5 weeks followed by I/125 seed implantation. Total of 57 seeds were placed with a total mCi dosage of 19.9 mCi. He also received anti androgen therapy. The patient tells me he did quite well and really was asymptomatic until roughly 3-4 weeks ago when he developed acute urinary retention vomiting a trip to the emergency room. Apparently prior to the urinary retention he had passed a few small clots in his urine. He required a catheter placement on July 29. Bladder scan at that time showed greater than 500 cc of urine in his bladder. The catheter was removed however the patient could not void. He has since been doing in and out catheterizations. 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 Erythematous patches were noted within the bladder wall compatible with radiation cystitis. Urinalysis was obtained I don't have these results. The patient was started on Repaflo to help with the retention. He is still having some frank bleeding this morning according to his wife. He feels as though his spontaneous voiding is improving however he is still doing when necessary in and out caths 09/17/15; the patient is seen today for follow-up visit in conjunction with hyperbaric oxygen treatment for late effect radiation cystitis. He states after starting hyperbarics last week I believe he had what he felt was improved urine flow. However over the weekend he had a problem. Staining on 4:00 he did an in and out catheter for 400 cc of clear urine. 4 hours later he didn't another in and out catheter for grossly bloody urine. Later that  evening he had to void but he could not because of blood clots and ended up in the emergency room where he had a painful set of procedures to put a catheter in place ultimately done by urology. He now has a indwelling Foley catheter, still passing some bloody urine and some blood clots in the leg bag. He was able to show this to me today. He has a follow-up with his urologist on Monday 09/30/15; the patient is seen today in routine follow-up for his ongoing hyperbaric oxygen treatment for late effect radiation cystitis. He has had a difficult time of late being in the ER on 2 occasions on 9/17 with urinary retention. This was in the face of an ongoing Foley catheter. The catheter was irrigated. He saw Dr. Yves Dill his urologist on 9/19. He had a TURP clot evacuation and bladder fulguration most of the bleeding appears to been in the prosthetic urethra although there was several large clots also present in the bladder. There was also several areas of hemorrhagic cystitis along the posterior wall which were fulgurated. He saw Dr. Yves Dill in follow-up yesterday. The Foley catheter was removed. He is able to void roughly 100 cc and then has 400 cc of urine via in and out catheterization. He has had no further bleeding. Feels HBO has made his spontaneous voiding improved 03/03/16; the patient is referred by Dr. Boneta Lucks his urologist for review review of hemorrhagic radiation cystitis. His history is still essentially the same as above. 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 While being treated [name], [name] ([id number]) with hyperbarics he noted improvement in the hematuria but also the fact that he did not have to do in and out catheters anymore  for urinary retention. He presented to Dr. Yves Dill on 02/26/16 with a recent history now of" flash" hematuria and he has passed a few clots. He is not complaining of  dysuria hasn't any obstructive voiding symptoms. He is on Elmiron and Rapaflo. Elmiron was recently discontinued 03/31/16; the patient is seen today in conjunction with HBO. Dive #20. Doing well, voiding well with improvement in hematuria, initial stream about 1x per wk. No other voiding difficulties and not need to catheterize. Objective Constitutional Patient is hypertensive.. Pulse regular and within target range for patient.Marland Kitchen Respirations regular, non-labored and within target range.. Temperature is normal and within the target range for the patient.. Patient's appearance is neat and clean. Appears in no acute distress. Well nourished and well developed.. Vitals Time Taken: 7:55 AM, Height: 65 in, Weight: 168 lbs, BMI: 28, Temperature: 98.2 F, Pulse: 72 bpm, Respiratory Rate: 16 breaths/min, Blood Pressure: 148/86 mmHg. Gastrointestinal (GI) no masses. No bladder distention. Plan Hyperbaric Oxygen Therapy: Evaluate for HBO Therapy Indication: - soft tissue radionecrosis If appropriate for treatment, begin HBOT per protocol: 2.0 ATA for 90 Minutes without Air Breaks One treatment per day (delivered Monday through Friday unless otherwise specified in Special Instructions below): Total # of Treatments: - 63 [name], [name] ([id number]) 1 patient continues to do well,improved with HBO (see HPI) Electronic Signature(s) Signed: 04/01/2016 5:50:35 AM By: Linton Ham MD Entered By: Linton Ham on 03/31/2016 13:09:45 [name], [name] ([id number]) -------------------------------------------------------------------------------- [place] Details Patient Name: [name] Date of Service: 03/31/2016 Medical Record Number: [id number] Patient Account Number: [id number] Date of Birth/Sex: 06-May-1955 (61 y.o. Male) Treating RN: Cornell Barman Primary Care Provider: Annye Asa Other Clinician: Referring Provider: Annye Asa Treating Provider/Extender: Ricard Dillon Weeks in Treatment: 4 Diagnosis Coding ICD-10 Codes Code Description N30.41 Irradiation cystitis with hematuria L59.8 Other specified disorders of the skin and subcutaneous tissue related to radiation Z85.46 Personal history of malignant neoplasm of prostate Notes 30 day check. No Charge, visit followed Hyperbaric Oxygen Therapy today. Electronic Signature(s) Signed: 04/01/2016 5:50:35 AM By: Linton Ham MD Previous Signature: 03/31/2016 11:42:03 AM Version By: Gretta Cool RN, BSN, Kim RN, BSN Entered By: Linton Ham on 03/31/2016 13:10:59

## 2016-04-03 NOTE — Progress Notes (Signed)
[name] ([id number]) Visit Report for 04/02/2016 Arrival Information Details Patient Name: [name], [name] Date of Service: 04/02/2016 8:00 AM Medical Record Number: [id number] Patient Account Number: [id number] Date of Birth/Sex: [birth year] (61 y.o. Male) Treating RN: Primary Care Donzell Coller: Annye Asa Other Clinician: Jacqulyn Bath Referring Braelyn Bordonaro: Annye Asa Treating Preslee Regas/Extender: Frann Rider in Treatment: 4 Visit Information History Since Last Visit Added or deleted any medications: No Patient Arrived: Ambulatory Any new allergies or adverse reactions: No Arrival Time: 07:55 Had a fall or experienced change in No Transfer Assistance: None activities of daily living that may affect Patient Identification Verified: Yes risk of falls: Secondary Verification Process Yes Signs or symptoms of abuse/neglect since last No Completed: visito Patient Requires Transmission-Based No Hospitalized since last visit: No Precautions: Pain Present Now: No Patient Has Alerts: No Electronic Signature(s) Signed: 04/02/2016 10:23:31 AM By: Lorine Bears RCP, RRT, CHT Entered By: Lorine Bears on 04/02/2016 08:07:42 Schippers, Jahrell ([id number]) -------------------------------------------------------------------------------- Encounter Discharge Information Details Patient Name: [name] Date of Service: 04/02/2016 8:00 AM Medical Record Number: [id number] Patient Account Number: [id number] Date of Birth/Sex: [birth year] (62 y.o. Male) Treating RN: Primary Care Tahjai Schetter: Annye Asa Other Clinician: Jacqulyn Bath Referring Amoreena Neubert: Annye Asa Treating Javari Bufkin/Extender: Frann Rider in Treatment: 4 Encounter Discharge Information Items Facility Notification Discharge Pain Level: 0 Facility Type: Home Health Discharge Condition: Stable Ambulatory Status: Ambulatory Discharge Destination:  Home Private Transportation: Auto Accompanied By: self Schedule Follow-up Appointment: No Medication Reconciliation completed No and provided to Patient/Care Kimball Appleby: Clinical Summary of Care: Notes Patient has an HBO treatment scheduled on 04/05/16 at 08:00 am. Electronic Signature(s) Signed: 04/02/2016 10:23:31 AM By: Lorine Bears RCP, RRT, CHT Entered By: Lorine Bears on 04/02/2016 10:23:13 Gudiel, Brylin ([id number]) -------------------------------------------------------------------------------- Vitals Details Patient Name: [name] Date of Service: 04/02/2016 8:00 AM Medical Record Number: [id number] Patient Account Number: [id number] Date of Birth/Sex: Dec 07, 1955 (61 y.o. Male) Treating RN: Primary Care Nelwyn Hebdon: Annye Asa Other Clinician: Jacqulyn Bath Referring Marjean Imperato: Annye Asa Treating Daymen Hassebrock/Extender: Frann Rider in Treatment: 4 Vital Signs Time Taken: 07:55 Temperature (F): 98.3 Height (in): 65 Pulse (bpm): 72 Weight (lbs): 168 Respiratory Rate (breaths/min): 16 Body Mass Index (BMI): 28 Blood Pressure (mmHg): 128/80 Reference Range: 80 - 120 mg / dl Electronic Signature(s) Signed: 04/02/2016 10:23:31 AM By: Lorine Bears RCP, RRT, CHT Entered By: Becky Sax, Amado Nash on 04/02/2016 08:08:09

## 2016-04-03 NOTE — Progress Notes (Signed)
MATTI, MINNEY ([id number]) Visit Report for 04/02/2016 Physician Orders Details Patient Name: [name], [name] Date of Service: 04/02/2016 8:00 AM Medical Record Number: [id number] Patient Account Number: [id number] Date of Birth/Sex: 1955-12-19 (61 y.o. Male) Treating RN: Primary Care Provider: Annye Asa Other Clinician: Jacqulyn Bath Referring Provider: Annye Asa Treating Provider/Extender: Frann Rider in Treatment: 4 Verbal / Phone Orders: No Diagnosis Coding ICD-10 Coding Code Description N30.41 Irradiation cystitis with hematuria L59.8 Other specified disorders of the skin and subcutaneous tissue related to radiation Z85.46 Personal history of malignant neoplasm of prostate Electronic Signature(s) Signed: 04/02/2016 10:48:57 AM By: Christin Fudge MD, FACS Entered By: Christin Fudge on 04/02/2016 10:48:57 Markarian, Orry ([id number]) -------------------------------------------------------------------------------- Problem List Details Patient Name: [name] Date of Service: 04/02/2016 8:00 AM Medical Record Number: [id number] Patient Account Number: [id number] Date of Birth/Sex: 01-30-1955 (61 y.o. Male) Treating RN: Primary Care Provider: Annye Asa Other Clinician: Jacqulyn Bath Referring Provider: Annye Asa Treating Provider/Extender: Frann Rider in Treatment: 4 Active Problems ICD-10 Encounter Code Description Active Date Diagnosis N30.41 Irradiation cystitis with hematuria 03/03/2016 Yes L59.8 Other specified disorders of the skin and subcutaneous 03/03/2016 Yes tissue related to radiation Z85.46 Personal history of malignant neoplasm of prostate 03/03/2016 Yes Inactive Problems Resolved Problems Electronic Signature(s) Signed: 04/02/2016 10:48:50 AM By: Christin Fudge MD, FACS Entered By: Christin Fudge on 04/02/2016 10:48:50 Ullery, Elizeo  ([id number]) -------------------------------------------------------------------------------- SuperBill Details Patient Name: [name] Date of Service: 04/02/2016 Medical Record Number: [id number] Patient Account Number: [id number] Date of Birth/Sex: 02/11/1955 (61 y.o. Male) Treating RN: Primary Care Provider: Annye Asa Other Clinician: Jacqulyn Bath Referring Provider: Annye Asa Treating Provider/Extender: Frann Rider in Treatment: 4 Diagnosis Coding ICD-10 Codes Code Description N30.41 Irradiation cystitis with hematuria L59.8 Other specified disorders of the skin and subcutaneous tissue related to radiation Z85.46 Personal history of malignant neoplasm of prostate Facility Procedures CPT4 Code: [id number] Description: (Facility Use Only) HBOT, full body chamber, 71min Modifier: Quantity: 4 Physician Procedures CPT4: Description Modifier Quantity Code [id number] - WC PHYS HYPERBARIC OXYGEN THERAPY 1 ICD-10 Description Diagnosis N30.41 Irradiation cystitis with hematuria L59.8 Other specified disorders of the skin and subcutaneous tissue related to radiation  Z85.46 Personal history of malignant neoplasm of prostate Electronic Signature(s) Signed: 04/02/2016 10:49:26 AM By: Christin Fudge MD, FACS Previous Signature: 04/02/2016 10:23:31 AM Version By: Lorine Bears RCP, RRT, CHT Entered By: Christin Fudge on 04/02/2016 10:49:26

## 2016-04-03 NOTE — Progress Notes (Signed)
SANJEEV, MAIN ([id number]) Visit Report for 04/02/2016 HBO Details Patient Name: TAVORIS, BRISK Date of Service: 04/02/2016 8:00 AM Medical Record Number: [id number] Patient Account Number: [id number] Date of Birth/Sex: [birth year] (61 y.o. 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 HBO Treatment Details Treatment Number: 22 Patient Type: Outpatient Chamber Type: Monoplace Chamber Serial #: [id number] Treatment Protocol: 2.0 ATA with 90 minutes oxygen, and no air breaks Treatment Details Compression Rate Down: 1.5 psi / minute De-Compression Rate Up: 1.5 psi / minute Air breaks and breathing Compress Tx Pressure periods Decompress Decompress Begins Reached (leave unused spaces Begins Ends blank) Chamber Pressure (ATA) 1 2 - - - - - - 2 1 Clock Time (24 hr) 08:06 08:18 - - - - - - 09:49 09:59 Treatment Length: 113 (minutes) Treatment Segments: 4 Capillary Blood Glucose Pre Capillary Blood Glucose (mg/dl): Post Capillary Blood Glucose (mg/dl): Vital Signs Capillary Blood Glucose Reference Range: 80 - 120 mg / dl HBO Diabetic Blood Glucose Intervention Range: <131 mg/dl or >249 mg/dl Time Vitals Blood Respiratory Capillary Blood Glucose Pulse Action Type: Pulse: Temperature: Taken: Pressure: Rate: Glucose (mg/dl): Meter #: Oximetry (%) Taken: Pre 07:55 128/80 72 16 98.3 Post 10:00 144/98 72 16 98.1 Treatment Response Treatment Completion Status: Treatment Completed without Adverse Event Wilhide, Jorgen ([id number]) HBO Attestation I certify that I supervised this HBO treatment in accordance with Medicare  guidelines. A trained Yes emergency response team is readily available per hospital policies and procedures. Continue HBOT as ordered. Yes Electronic Signature(s) Signed: 04/02/2016 10:49:17 AM By: Christin Fudge MD, FACS Previous Signature: 04/02/2016 10:23:31 AM Version By: Lorine Bears RCP, RRT, CHT Entered By: Christin Fudge on 04/02/2016 10:49:17 Gullett, Lonald ([id number]) -------------------------------------------------------------------------------- HBO Safety Checklist Details Patient Name: [name] Date of Service: 04/02/2016 8:00 AM Medical Record Number: [id number] Patient Account Number: [id number] Date of Birth/Sex: Jul 30, 1955 (61 y.o. Male) Treating RN: Primary Care Rydell Wiegel: Annye Asa Other Clinician: Jacqulyn Bath Referring Lezly Rumpf: Annye Asa Treating Amay Mijangos/Extender: Frann Rider in Treatment: 4 HBO Safety Checklist Items Safety Checklist Consent Form Signed Patient voided / foley secured and emptied When did you last eato 04/01/16 pm Last dose of injectable or oral agent n/a NA Ostomy pouch emptied and vented if applicable NA All implantable devices assessed, documented and approved NA Intravenous access site secured and place Valuables secured Linens and cotton and cotton/polyester blend (less than 51% polyester) Personal oil-based products / skin lotions / body lotions removed NA Wigs or hairpieces removed NA Smoking or tobacco materials removed Books / newspapers / magazines / loose paper removed Cologne, aftershave, perfume and deodorant removed Jewelry removed (may wrap wedding band) NA Make-up removed Hair care products removed Battery operated devices (external) removed Heating patches and chemical warmers removed NA Titanium eyewear removed NA Nail polish cured greater than 10 hours NA Casting material cured greater than 10 hours NA Hearing aids removed Loose dentures or partials removed NA  Prosthetics have been removed Patient demonstrates correct use of air break device (if applicable) Patient concerns have been addressed Patient grounding bracelet on and cord attached to chamber Specifics for Inpatients (complete in addition to above) Medication sheet sent with patient Intravenous medications needed or due during therapy sent with patient BRENTLINGER, Reegan ([id number]) Drainage  tubes (e.g. nasogastric tube or chest tube secured and vented) Endotracheal or Tracheotomy tube secured Cuff deflated of air and inflated with saline Airway suctioned Electronic Signature(s) Signed: 04/02/2016 10:23:31 AM By: Lorine Bears RCP, RRT, CHT Entered By: Lorine Bears on 04/02/2016 08:09:20

## 2016-04-05 ENCOUNTER — Encounter: Payer: Federal, State, Local not specified - PPO | Attending: Surgery | Admitting: Surgery

## 2016-04-05 DIAGNOSIS — N3041 Irradiation cystitis with hematuria: Secondary | ICD-10-CM | POA: Insufficient documentation

## 2016-04-05 DIAGNOSIS — Z8546 Personal history of malignant neoplasm of prostate: Secondary | ICD-10-CM | POA: Insufficient documentation

## 2016-04-05 DIAGNOSIS — L598 Other specified disorders of the skin and subcutaneous tissue related to radiation: Secondary | ICD-10-CM | POA: Diagnosis not present

## 2016-04-05 DIAGNOSIS — N3289 Other specified disorders of bladder: Secondary | ICD-10-CM | POA: Diagnosis not present

## 2016-04-05 NOTE — Progress Notes (Signed)
LOLA, LOFARO ([id number]) Visit Report for 04/05/2016 HBO Details Patient Name: [name], [name] Date of Service: 04/05/2016 8:00 AM Medical Record Number: [id number] Patient Account Number: [id number] Date of Birth/Sex: 09/02/1955 (61 y.o. 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 HBO Treatment Details Treatment Number: 23 Patient Type: Outpatient Chamber Type: Monoplace Chamber Serial #: [id number] Treatment Protocol: 2.0 ATA with 90 minutes oxygen, and no air breaks Treatment Details Compression Rate Down: 1.5 psi / minute De-Compression Rate Up: 1.5 psi / minute Air breaks and breathing Compress Tx Pressure periods Decompress Decompress Begins Reached (leave unused spaces Begins Ends blank) Chamber Pressure (ATA) 1 2 - - - - - - 2 1 Clock Time (24 hr) 08:05 08:17 - - - - - - 09:47 09:57 Treatment Length: 112 (minutes) Treatment Segments: 4 Capillary Blood Glucose Pre Capillary Blood Glucose (mg/dl): Post Capillary Blood Glucose (mg/dl): Vital Signs Capillary Blood Glucose Reference Range: 80 - 120 mg / dl HBO Diabetic Blood Glucose Intervention Range: <131 mg/dl or >249 mg/dl Time Vitals Blood Respiratory Capillary Blood Glucose Pulse Action Type: Pulse: Temperature: Taken: Pressure: Rate: Glucose (mg/dl): Meter #: Oximetry (%) Taken: Pre 07:50 154/90 72 16 98.4 Post 09:58 138/90 72 16 98.3 Treatment Response Treatment Completion Status: Treatment Completed without Adverse Event [name], [name] ([id number]) Electronic Signature(s) Signed: 04/05/2016 12:44:44 PM By: Lorine Bears RCP, RRT,  CHT Signed: 04/05/2016 4:17:13 PM By: Christin Fudge MD, FACS Previous Signature: 04/05/2016 10:11:04 AM Version By: Christin Fudge MD, FACS Entered By: Lorine Bears on 04/05/2016 10:40:47 [name], [name] ([id number]) -------------------------------------------------------------------------------- HBO Safety Checklist Details Patient Name: [name] Date of Service: 04/05/2016 8:00 AM Medical Record Number: [id number] Patient Account Number: [id number] Date of Birth/Sex: Nov 23, 1955 (61 y.o. Male) Treating RN: Primary Care Engelbert Sevin: Annye Asa Other Clinician: Jacqulyn Bath Referring Bradden Tadros: Annye Asa Treating Leola Fiore/Extender: Frann Rider in Treatment: 4 HBO Safety Checklist Items Safety Checklist Consent Form Signed Patient voided / foley secured and emptied When did you last eato 04/04/16 pm Last dose of injectable or oral agent n.a NA Ostomy pouch emptied and vented if applicable NA All implantable devices assessed, documented and approved NA Intravenous access site secured and place Valuables secured Linens and cotton and cotton/polyester blend (less than 51% polyester) Personal oil-based products / skin lotions / body lotions removed NA Wigs or hairpieces removed NA Smoking or tobacco materials removed Books / newspapers / magazines / loose paper removed Cologne, aftershave, perfume and deodorant removed Jewelry removed (may wrap wedding band) NA Make-up removed Hair care products removed Battery operated devices (external) removed Heating patches and chemical warmers removed NA Titanium eyewear removed NA Nail polish cured greater than 10 hours NA Casting material cured greater than 10 hours NA Hearing aids removed Loose dentures or partials removed NA Prosthetics have been removed Patient demonstrates correct use of air break device (if applicable) Patient concerns have been addressed Patient grounding bracelet on and cord  attached to chamber Specifics for Inpatients (complete in addition to above) Medication sheet sent with patient Intravenous medications needed or due during therapy sent with patient [name], [name] ([id number]) Drainage tubes (e.g. nasogastric tube or chest tube secured and vented) Endotracheal or Tracheotomy tube secured Cuff deflated of air and inflated with saline Airway  suctioned Electronic Signature(s) Signed: 04/05/2016 12:44:44 PM By: Lorine Bears RCP, RRT, CHT Entered By: Lorine Bears on 04/05/2016 08:07:39

## 2016-04-05 NOTE — Progress Notes (Signed)
TOM, MACPHERSON ([id number]) Visit Report for 04/05/2016 Arrival Information Details Patient Name: [name], [name] Date of Service: 04/05/2016 8:00 AM Medical Record Number: [id number] Patient Account Number: [id number] Date of Birth/Sex: [birth year] (61 y.o. Male) Treating RN: Primary Care Treva Huyett: Annye Asa Other Clinician: Jacqulyn Bath Referring Vadie Principato: Annye Asa Treating Schawn Byas/Extender: Frann Rider in Treatment: 4 Visit Information History Since Last Visit Added or deleted any medications: No Patient Arrived: Ambulatory Any new allergies or adverse reactions: No Arrival Time: 07:50 Had a fall or experienced change in No Accompanied By: self activities of daily living that may affect Transfer Assistance: None risk of falls: Patient Identification Verified: Yes Signs or symptoms of abuse/neglect since last No Secondary Verification Process Yes visito Completed: Hospitalized since last visit: No Patient Requires Transmission-Based No Pain Present Now: No Precautions: Patient Has Alerts: No Electronic Signature(s) Signed: 04/05/2016 12:44:44 PM By: Lorine Bears RCP, RRT, CHT Entered By: Lorine Bears on 04/05/2016 08:06:12 [name], [name] ([id number]) -------------------------------------------------------------------------------- Encounter Discharge Information Details Patient Name: [name] Date of Service: 04/05/2016 8:00 AM Medical Record Number: [id number] Patient Account Number: [id number] Date of Birth/Sex: [birth year] (62 y.o. Male) Treating RN: Primary Care Keithon Mccoin: Annye Asa Other Clinician: Jacqulyn Bath Referring Ronnae Kaser: Annye Asa Treating Jamarria Real/Extender: Frann Rider in Treatment: 4 Encounter Discharge Information Items Discharge Pain Level: 0 Discharge Condition: Stable Ambulatory Status: Ambulatory Discharge Destination:  Home Private Transportation: Auto Accompanied By: self Schedule Follow-up Appointment: No Medication Reconciliation completed and No provided to Patient/Care Rydan Gulyas: Clinical Summary of Care: Notes Patient has an HBO treatment scheduled on 04/06/16 at 08:00 am. Electronic Signature(s) Signed: 04/05/2016 12:44:44 PM By: Lorine Bears RCP, RRT, CHT Entered By: Lorine Bears on 04/05/2016 10:41:54 [name], [name] ([id number]) -------------------------------------------------------------------------------- Vitals Details Patient Name: [name] Date of Service: 04/05/2016 8:00 AM Medical Record Number: [id number] Patient Account Number: [id number] Date of Birth/Sex: 12/28/1955 (61 y.o. Male) Treating RN: Primary Care Pattye Meda: Annye Asa Other Clinician: Jacqulyn Bath Referring Katyana Trolinger: Annye Asa Treating Jayten Gabbard/Extender: Frann Rider in Treatment: 4 Vital Signs Time Taken: 07:50 Temperature (F): 98.4 Height (in): 65 Pulse (bpm): 72 Weight (lbs): 168 Respiratory Rate (breaths/min): 16 Body Mass Index (BMI): 28 Blood Pressure (mmHg): 154/90 Reference Range: 80 - 120 mg / dl Electronic Signature(s) Signed: 04/05/2016 12:44:44 PM By: Lorine Bears RCP, RRT, CHT Entered By: Becky Sax, Amado Nash on 04/05/2016 08:06:47

## 2016-04-06 ENCOUNTER — Encounter: Payer: Federal, State, Local not specified - PPO | Admitting: Nurse Practitioner

## 2016-04-06 DIAGNOSIS — N3041 Irradiation cystitis with hematuria: Secondary | ICD-10-CM | POA: Diagnosis not present

## 2016-04-06 DIAGNOSIS — N3289 Other specified disorders of bladder: Secondary | ICD-10-CM | POA: Diagnosis not present

## 2016-04-06 DIAGNOSIS — Z8546 Personal history of malignant neoplasm of prostate: Secondary | ICD-10-CM | POA: Diagnosis not present

## 2016-04-06 DIAGNOSIS — L598 Other specified disorders of the skin and subcutaneous tissue related to radiation: Secondary | ICD-10-CM | POA: Diagnosis not present

## 2016-04-06 NOTE — Progress Notes (Signed)
IRBIN, FINES ([id number]) Visit Report for 04/06/2016 HBO Details Patient Name: [name], [name] Date of Service: 04/06/2016 8:00 AM Medical Record Number: [id number] Patient Account Number: [id number] Date of Birth/Sex: 14-Jan-1955 (61 y.o. 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 HBO Treatment Details Treatment Number: 24 Patient Type: Outpatient Chamber Type: Monoplace Chamber Serial #: [id number] Treatment Protocol: 2.0 ATA with 90 minutes oxygen, and no air breaks Treatment Details Compression Rate Down: 1.5 psi / minute De-Compression Rate Up: 1.5 psi / minute Air breaks and breathing Compress Tx Pressure periods Decompress Decompress Begins Reached (leave unused spaces Begins Ends blank) Chamber Pressure (ATA) 1 2 - - - - - - 2 1 Clock Time (24 hr) 08:07 08:19 - - - - - - 09:48 09:59 Treatment Length: 112 (minutes) Treatment Segments: 4 Capillary Blood Glucose Pre Capillary Blood Glucose (mg/dl): Post Capillary Blood Glucose (mg/dl): Vital Signs Capillary Blood Glucose Reference Range: 80 - 120 mg / dl HBO Diabetic Blood Glucose Intervention Range: <131 mg/dl or >249 mg/dl Time Vitals Blood Respiratory Capillary Blood Glucose Pulse Action Type: Pulse: Temperature: Taken: Pressure: Rate: Glucose (mg/dl): Meter #: Oximetry (%) Taken: Pre 08:03 142/84 64 16 98.4 Post 10:04 155/88 68 16 98.2 Pre-Treatment Ear Evaluation Left Right [name], [name] ([id number]) Clear: Yes Clear: Yes Intact: Yes Intact: Yes Color: pearl Color: pearl PE Tubes inserted: No PE Tubes inserted: No Irrigated:  No Irrigated: No Myringotomy performed: No Myringotomy performed: No Left [name] Scale: Grade 0 Right [name] Scale: Grade 0 Treatment Response Treatment Completion Status: Treatment Completed without Adverse Event Post Treatment [name] Score Post Treatment [name] Score: Left Ear Grade 0 Post Treatment [name] Score: Right Ear Grade 0 HBO Attestation I certify that I supervised this HBO treatment in accordance with Medicare guidelines. A trained Yes emergency response team is readily available per hospital policies and procedures. Continue HBOT as ordered. Yes Electronic Signature(s) Signed: 04/06/2016 12:57:56 PM By: Lawanda Cousins Previous Signature: 04/06/2016 10:04:43 AM Version By: Montey Hora Previous Signature: 04/06/2016 9:59:18 AM Version By: Montey Hora Previous Signature: 04/06/2016 8:19:31 AM Version By: Montey Hora Entered By: Lawanda Cousins on 04/06/2016 12:57:56 [name], [name] ([id number]) -------------------------------------------------------------------------------- HBO Safety Checklist Details Patient Name: [name] Date of Service: 04/06/2016 8:00 AM Medical Record Number: [id number] Patient Account Number: [id number] Date of Birth/Sex: October 13, 1955 (61 y.o. Male) Treating RN: Montey Hora Primary Care Ekam Bonebrake: Annye Asa Other Clinician: Jacqulyn Bath Referring Erandi Lemma: Annye Asa Treating Goddess Gebbia/Extender: Cathie Olden in Treatment: 4 HBO Safety Checklist Items Safety Checklist Consent Form Signed Patient voided / foley secured and emptied When did you last eato 04/05/2016 Last dose of injectable or oral agent n/a NA Ostomy pouch emptied and vented if applicable NA All implantable devices assessed, documented and approved NA Intravenous access site secured and place Valuables secured Linens and cotton and cotton/polyester blend (less than 51% polyester) Personal oil-based products / skin lotions / body lotions removed NA Wigs or  hairpieces removed NA Smoking or tobacco materials removed Books / newspapers / magazines / loose paper removed Cologne, aftershave, perfume and deodorant removed Jewelry removed (may wrap wedding band) NA Make-up removed NA Hair care products removed Battery operated devices (external) removed Heating patches and chemical warmers removed NA Titanium eyewear removed  NA Nail polish cured greater than 10 hours NA Casting material cured greater than 10 hours NA Hearing aids removed Loose dentures or partials removed NA Prosthetics have been removed Patient demonstrates correct use of air break device (if applicable) Patient concerns have been addressed Patient grounding bracelet on and cord attached to chamber Specifics for Inpatients (complete in addition to above) Medication sheet sent with patient Intravenous medications needed or due during therapy sent with patient [name] ([id number]) Drainage tubes (e.g. nasogastric tube or chest tube secured and vented) Endotracheal or Tracheotomy tube secured Cuff deflated of air and inflated with saline Airway suctioned Electronic Signature(s) Signed: 04/06/2016 8:16:50 AM By: Montey Hora Entered By: Montey Hora on 04/06/2016 08:16:49

## 2016-04-06 NOTE — Progress Notes (Signed)
GIULIO, BERTINO ([id number]) Visit Report for 04/06/2016 Arrival Information Details Patient Name: SHANKAR, SILBER Date of Service: 04/06/2016 8:00 AM Medical Record Number: [id number] Patient Account Number: [id number] Date of Birth/Sex: 1955-11-11 (61 y.o. Male) Treating RN: Montey Hora Primary Care Rishi Vicario: Annye Asa Other Clinician: Jacqulyn Bath Referring Ava Tangney: Annye Asa Treating Ilma Achee/Extender: Cathie Olden in Treatment: 4 Visit Information History Since Last Visit Added or deleted any medications: No Patient Arrived: Ambulatory Any new allergies or adverse reactions: No Arrival Time: 08:00 Had a fall or experienced change in No Accompanied By: self activities of daily living that may affect Transfer Assistance: None risk of falls: Patient Identification Verified: Yes Signs or symptoms of abuse/neglect since last No Secondary Verification Process Yes visito Completed: Hospitalized since last visit: No Patient Requires Transmission-Based No Pain Present Now: No Precautions: Patient Has Alerts: No Electronic Signature(s) Signed: 04/06/2016 8:15:01 AM By: Montey Hora Entered By: Montey Hora on 04/06/2016 08:15:00 Blessinger, Jaiyon ([id number]) -------------------------------------------------------------------------------- Encounter Discharge Information Details Patient Name: [name] Date of Service: 04/06/2016 8:00 AM Medical Record Number: [id number] Patient Account Number: [id number] Date of Birth/Sex: October 08, 1955 (61 y.o. Male) Treating RN: Montey Hora Primary Care Kile Kabler: Annye Asa Other Clinician: Jacqulyn Bath Referring Yadriel Kerrigan: Annye Asa Treating Alantis Bethune/Extender: Cathie Olden in Treatment: 4 Encounter Discharge Information Items Discharge Pain Level: 0 Discharge Condition: Stable Ambulatory Status: Ambulatory Discharge Destination: Home Private Transportation: Auto Accompanied By:  self Schedule Follow-up Appointment: Yes Medication Reconciliation completed and No provided to Patient/Care Knoxx Boeding: Clinical Summary of Care: Notes Patient has an HBO treatment scheduled on 04/07/16 at 08:00 am. Electronic Signature(s) Signed: 04/06/2016 10:05:45 AM By: Montey Hora Entered By: Montey Hora on 04/06/2016 10:05:45 Lakeman, Burak ([id number]) -------------------------------------------------------------------------------- [place] Details Patient Name: [name] Date of Service: 04/06/2016 8:00 AM Medical Record Number: [id number] Patient Account Number: [id number] Date of Birth/Sex: [birth year] (61 y.o. Male) Treating RN: Montey Hora Primary Care Rohaan Durnil: Annye Asa Other Clinician: Jacqulyn Bath Referring Elektra Wartman: Annye Asa Treating Dlisa Barnwell/Extender: Cathie Olden in Treatment: 4 Vital Signs Time Taken: 08:03 Temperature (F): 98.4 Height (in): 65 Pulse (bpm): 64 Weight (lbs): 168 Respiratory Rate (breaths/min): 16 Body Mass Index (BMI): 28 Blood Pressure (mmHg): 142/84 Reference Range: 80 - 120 mg / dl Electronic Signature(s) Signed: 04/06/2016 8:15:38 AM By: Montey Hora Entered By: Montey Hora on 04/06/2016 08:15:38

## 2016-04-07 ENCOUNTER — Encounter: Payer: Federal, State, Local not specified - PPO | Admitting: Nurse Practitioner

## 2016-04-07 DIAGNOSIS — Z8546 Personal history of malignant neoplasm of prostate: Secondary | ICD-10-CM | POA: Diagnosis not present

## 2016-04-07 DIAGNOSIS — N3041 Irradiation cystitis with hematuria: Secondary | ICD-10-CM | POA: Diagnosis not present

## 2016-04-07 DIAGNOSIS — L598 Other specified disorders of the skin and subcutaneous tissue related to radiation: Secondary | ICD-10-CM | POA: Diagnosis not present

## 2016-04-07 DIAGNOSIS — N3289 Other specified disorders of bladder: Secondary | ICD-10-CM | POA: Diagnosis not present

## 2016-04-08 ENCOUNTER — Encounter: Payer: Federal, State, Local not specified - PPO | Admitting: Surgery

## 2016-04-08 DIAGNOSIS — Z8546 Personal history of malignant neoplasm of prostate: Secondary | ICD-10-CM | POA: Diagnosis not present

## 2016-04-08 DIAGNOSIS — L598 Other specified disorders of the skin and subcutaneous tissue related to radiation: Secondary | ICD-10-CM | POA: Diagnosis not present

## 2016-04-08 DIAGNOSIS — N3041 Irradiation cystitis with hematuria: Secondary | ICD-10-CM | POA: Diagnosis not present

## 2016-04-08 NOTE — Progress Notes (Signed)
[name] ([id number]) Visit Report for 04/07/2016 HBO Details Patient Name: [name], [name] Date of Service: 04/07/2016 8:00 AM Medical Record Number: [id number] Patient Account Number: [id number] Date of Birth/Sex: [birth year] (61 y.o. 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 HBO Treatment Details Treatment Number: 25 Patient Type: Outpatient Chamber Type: Monoplace Chamber Serial #: [id number] Treatment Protocol: 2.0 ATA with 90 minutes oxygen, and no air breaks Treatment Details Compression Rate Down: 1.5 psi / minute De-Compression Rate Up: 1.5 psi / minute Air breaks and breathing Compress Tx Pressure periods Decompress Decompress Begins Reached (leave unused spaces Begins Ends blank) Chamber Pressure (ATA) 1 2 - - - - - - 2 1 Clock Time (24 hr) 08:10 08:21 - - - - - - 09:51 10:01 Treatment Length: 111 (minutes) Treatment Segments: 4 Capillary Blood Glucose Pre Capillary Blood Glucose (mg/dl): Post Capillary Blood Glucose (mg/dl): Vital Signs Capillary Blood Glucose Reference [name]: 80 - 120 mg / dl HBO Diabetic Blood Glucose Intervention [name]: <131 mg/dl or >249 mg/dl Time Vitals Blood Respiratory Capillary Blood Glucose Pulse Action Type: Pulse: Temperature: Taken: Pressure: Rate: Glucose (mg/dl): Meter #: Oximetry (%) Taken: Pre 08:00 180/96 72 16 98.1 Post 10:06 152/100 66 16 98.4 Pre-Treatment Ear Evaluation Left Right [name], [name] ([id number]) Clear: Yes Clear: Yes Intact: Yes Intact: Yes Color: pearl Color: pearl PE Tubes inserted: No PE Tubes inserted:  No Irrigated: No Irrigated: No Myringotomy performed: No Myringotomy performed: No Left [name] Scale: Grade 0 Right [name] Scale: Grade 0 Treatment Response Treatment Well Toleration: Treatment Treatment Completed without Adverse Event Completion Status: Post Treatment [name] Score Post Treatment [name] Score: Left Ear Grade 0 Post Treatment [name] Score: Right Ear Grade 0 HBO Attestation I certify that I supervised this HBO treatment in accordance with Medicare guidelines. A trained Yes emergency response team is readily available per hospital policies and procedures. Continue HBOT as ordered. Yes Electronic Signature(s) Signed: 04/07/2016 11:00:59 AM By: [name] Previous Signature: 04/07/2016 10:34:45 AM Version By: [name] BSN, RN Entered By: [name] on 04/07/2016 11:00:59 [name], [name] ([id number]) -------------------------------------------------------------------------------- HBO Safety Checklist Details Patient Name: [name] Date of Service: 04/07/2016 8:00 AM Medical Record Number: [id number] Patient Account Number: [id number] Date of Birth/Sex: [birth year] (61 y.o. Male) Treating RN: Afful, RN, BSN, [place] Primary Care [name]: [name] Other Clinician: Jacqulyn [name] Referring Sequoyah [name]: [name] Treating [name]/Extender: [name] in Treatment: 5 HBO Safety Checklist Items Safety Checklist Consent Form Signed Patient voided / foley secured and emptied When did you last eato dinner Last dose of injectable or oral agent n/a NA Ostomy pouch emptied and vented if applicable NA All implantable devices assessed, documented and approved NA Intravenous access site secured and place Valuables secured Linens and cotton and cotton/polyester blend (less than 51% polyester) Personal oil-based products / skin lotions / body lotions removed NA Wigs or hairpieces removed NA Smoking or tobacco materials removed Books / newspapers /  magazines / loose paper removed Cologne, aftershave, perfume and deodorant removed Jewelry removed (may wrap wedding band) NA Make-up removed Hair care products removed NA Battery operated devices (external) removed NA Heating patches and chemical warmers removed NA Titanium eyewear removed NA Nail polish cured greater than 10 hours  NA Casting material cured greater than 10 hours NA Hearing aids removed Loose dentures or partials removed NA Prosthetics have been removed NA Patient demonstrates correct use of air break device (if applicable) Patient concerns have been addressed Patient grounding bracelet on and cord attached to chamber Specifics for Inpatients (complete in addition to above) Medication sheet sent with patient Intravenous medications needed or due during therapy sent with patient [name], [name] ([id number]) Drainage tubes (e.g. nasogastric tube or chest tube secured and vented) Endotracheal or Tracheotomy tube secured Cuff deflated of air and inflated with saline Airway suctioned Electronic Signature(s) Signed: 04/07/2016 4:20:09 PM By: [name] BSN, RN Entered By: [name] on 04/07/2016 08:14:36

## 2016-04-08 NOTE — Progress Notes (Signed)
SY, SAINTJEAN ([id number]) Visit Report for 04/07/2016 Arrival Information Details Patient Name: [name], [name] Date of Service: 04/07/2016 8:00 AM Medical Record Number: [id number] Patient Account Number: [id number] Date of Birth/Sex: 06-Jan-1955 (61 y.o. Male) Treating RN: Baruch Gouty, RN, BSN, [place] Primary Care Angeliyah Kirkey: Annye Asa Other Clinician: Jacqulyn Bath Referring Brydon Spahr: Annye Asa Treating Kristain Hu/Extender: Cathie Olden in Treatment: 5 Visit Information History Since Last Visit All ordered tests and consults were completed: No Patient Arrived: Ambulatory Added or deleted any medications: No Arrival Time: 07:55 Any new allergies or adverse reactions: No Accompanied By: self Had a fall or experienced change in No Transfer Assistance: None activities of daily living that may affect Patient Identification Verified: Yes risk of falls: Secondary Verification Process Yes Signs or symptoms of abuse/neglect since last No Completed: visito Patient Requires Transmission-Based No Hospitalized since last visit: No Precautions: Pain Present Now: No Patient Has Alerts: No Electronic Signature(s) Signed: 04/07/2016 4:20:09 PM By: Regan Lemming BSN, RN Entered By: Regan Lemming on 04/07/2016 08:12:41 Balandran, Chucky ([id number]) -------------------------------------------------------------------------------- Encounter Discharge Information Details Patient Name: [name] Date of Service: 04/07/2016 8:00 AM Medical Record Number: [id number] Patient Account Number: [id number] Date of Birth/Sex: 1955/09/20 (61 y.o. Male) Treating RN: Baruch Gouty, RN, BSN, [place] Har[place] Primary Care Konner Warrior: Annye Asa Other Clinician: Jacqulyn Bath Referring Nida Manfredi: Annye Asa Treating Liyah Higham/Extender: Cathie Olden in Treatment: 5 Encounter Discharge Information Items Discharge Pain Level: 0 Discharge Condition: Stable Ambulatory Status: Ambulatory Discharge  Destination: Home Private Transportation: Auto Accompanied By: self Schedule Follow-up Appointment: No Medication Reconciliation completed and No provided to Patient/Care Aithan Farrelly: Clinical Summary of Care: Electronic Signature(s) Signed: 04/07/2016 10:56:39 AM By: Regan Lemming BSN, RN Entered By: Regan Lemming on 04/07/2016 10:56:38 Railsback, Jolon ([id number]) -------------------------------------------------------------------------------- Patient/Caregiver Education Details Patient Name: [name] Date of Service: 04/07/2016 8:00 AM Medical Record Number: [id number] Patient Account Number: [id number] Date of Birth/Gender: 03-Jul-1955 (61 y.o. Male) Treating RN: Baruch Gouty, RN, BSN, [place] Primary Care Physician: Annye Asa Other Clinician: Jacqulyn Bath Referring Physician: Annye Asa Treating Physician/Extender: Cathie Olden in Treatment: 5 Education Assessment Education Provided To: Patient Education Topics Provided Wound/Skin Impairment: Methods: Explain/Verbal Responses: State content correctly Electronic Signature(s) Signed: 04/07/2016 4:20:09 PM By: Regan Lemming BSN, RN Entered By: Regan Lemming on 04/07/2016 10:56:22 Haidar, Dempsy ([id number]) -------------------------------------------------------------------------------- Vitals Details Patient Name: [name] Date of Service: 04/07/2016 8:00 AM Medical Record Number: [id number] Patient Account Number: [id number] Date of Birth/Sex: Jun 03, 1955 (61 y.o. Male) Treating RN: Afful, RN, BSN, [place] Primary Care Nimo Verastegui: Annye Asa Other Clinician: Jacqulyn Bath Referring Larken Urias: Annye Asa Treating Milia Warth/Extender: Cathie Olden in Treatment: 5 Vital Signs Time Taken: 08:00 Temperature (F): 98.1 Height (in): 65 Pulse (bpm): 72 Weight (lbs): 168 Respiratory Rate (breaths/min): 16 Body Mass Index (BMI): 28 Blood Pressure (mmHg): 180/96 Reference Range: 80 - 120 mg /  dl Electronic Signature(s) Signed: 04/07/2016 4:20:09 PM By: Regan Lemming BSN, RN Entered By: Regan Lemming on 04/07/2016 08:13:07

## 2016-04-09 ENCOUNTER — Encounter: Payer: Federal, State, Local not specified - PPO | Admitting: Surgery

## 2016-04-09 DIAGNOSIS — Z8546 Personal history of malignant neoplasm of prostate: Secondary | ICD-10-CM | POA: Diagnosis not present

## 2016-04-09 DIAGNOSIS — N3289 Other specified disorders of bladder: Secondary | ICD-10-CM | POA: Diagnosis not present

## 2016-04-09 DIAGNOSIS — N3041 Irradiation cystitis with hematuria: Secondary | ICD-10-CM | POA: Diagnosis not present

## 2016-04-09 DIAGNOSIS — L598 Other specified disorders of the skin and subcutaneous tissue related to radiation: Secondary | ICD-10-CM | POA: Diagnosis not present

## 2016-04-09 NOTE — Progress Notes (Signed)
[name], [name] ([id number]) Visit Report for 04/08/2016 HBO Details Patient Name: [name], [name] Date of Service: 04/08/2016 8:00 AM Medical Record Number: [id number] Patient Account Number: [id number] Date of Birth/Sex: [birth year] (61 y.o. 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 HBO Treatment Details Treatment Number: 26 Patient Type: Outpatient Chamber Type: Monoplace Chamber Serial #: [id number] Treatment Protocol: 2.0 ATA with 90 minutes oxygen, and no air breaks Treatment Details Compression Rate Down: 1.5 psi / minute De-Compression Rate Up: 1.5 psi / minute Air breaks and breathing Compress Tx Pressure periods Decompress Decompress Begins Reached (leave unused spaces Begins Ends blank) Chamber Pressure (ATA) 1 2 - - - - - - 2 1 Clock Time (24 hr) 08:06 08:18 - - - - - - 09:48 09:59 Treatment Length: 113 (minutes) Treatment Segments: 4 Capillary Blood Glucose Pre Capillary Blood Glucose (mg/dl): Post Capillary Blood Glucose (mg/dl): Vital Signs Capillary Blood Glucose Reference Range: 80 - 120 mg / dl HBO Diabetic Blood Glucose Intervention Range: <131 mg/dl or >249 mg/dl Time Vitals Blood Respiratory Capillary Blood Glucose Pulse Action Type: Pulse: Temperature: Taken: Pressure: Rate: Glucose (mg/dl): Meter #: Oximetry (%) Taken: Pre 07:50 132/88 66 16 98.2 Post 10:00 132/92 72 16 98 Treatment Response Treatment Completion Status: Treatment Completed without Adverse Event [name], [name] ([id number]) Electronic Signature(s) Signed: 04/08/2016 12:30:35 PM By: Lorine Bears RCP, RRT,  CHT Signed: 04/08/2016 4:30:26 PM By: Christin Fudge MD, FACS Previous Signature: 04/08/2016 10:14:47 AM Version By: Christin Fudge MD, FACS Entered By: Lorine Bears on 04/08/2016 10:38:59 [name], [name] ([id number]) -------------------------------------------------------------------------------- HBO Safety Checklist Details Patient Name: [name] Date of Service: 04/08/2016 8:00 AM Medical Record Number: [id number] Patient Account Number: [id number] Date of Birth/Sex: 1955-09-17 (61 y.o. Male) Treating RN: Primary Care Korena Nass: Annye Asa Other Clinician: Jacqulyn Bath Referring Saran Laviolette: Annye Asa Treating Serenity Batley/Extender: Frann Rider in Treatment: 5 HBO Safety Checklist Items Safety Checklist Consent Form Signed Patient voided / foley secured and emptied When did you last eato 04/07/16 pm Last dose of injectable or oral agent n/a NA Ostomy pouch emptied and vented if applicable NA All implantable devices assessed, documented and approved NA Intravenous access site secured and place Valuables secured Linens and cotton and cotton/polyester blend (less than 51% polyester) Personal oil-based products / skin lotions / body lotions removed NA Wigs or hairpieces removed NA Smoking or tobacco materials removed Books / newspapers / magazines / loose paper removed Cologne, aftershave, perfume and deodorant removed Jewelry removed (may wrap wedding band) NA Make-up removed Hair care products removed Battery operated devices (external) removed Heating patches and chemical warmers removed NA Titanium eyewear removed NA Nail polish cured greater than 10 hours NA Casting material cured greater than 10 hours NA Hearing aids removed Loose dentures or partials removed NA Prosthetics have been removed Patient demonstrates correct use of air break device (if applicable) Patient concerns have been addressed Patient grounding bracelet on and cord  attached to chamber Specifics for Inpatients (complete in addition to above) Medication sheet sent with patient Intravenous medications needed or due during therapy sent with patient [name], [name] ([id number]) Drainage tubes (e.g. nasogastric tube or chest tube secured and vented) Endotracheal or Tracheotomy tube secured Cuff deflated of air and inflated with saline Airway  suctioned Electronic Signature(s) Signed: 04/08/2016 12:30:35 PM By: Lorine Bears RCP, RRT, CHT Entered By: Lorine Bears on 04/08/2016 08:22:16

## 2016-04-09 NOTE — Progress Notes (Signed)
VIET, KEMMERER ([id number]) Visit Report for 04/08/2016 Arrival Information Details Patient Name: [name], [name] Date of Service: 04/08/2016 8:00 AM Medical Record Number: [id number] Patient Account Number: [id number] Date of Birth/Sex: 1955-02-02 (61 y.o. Male) Treating RN: Primary Care Adiana Smelcer: Annye Asa Other Clinician: Jacqulyn Bath Referring Saory Carriero: Annye Asa Treating Meily Glowacki/Extender: Frann Rider in Treatment: 5 Visit Information History Since Last Visit Added or deleted any medications: No Patient Arrived: Ambulatory Any new allergies or adverse reactions: No Arrival Time: 07:50 Had a fall or experienced change in No Accompanied By: self activities of daily living that may affect Transfer Assistance: None risk of falls: Patient Identification Verified: Yes Signs or symptoms of abuse/neglect since last No Secondary Verification Process Yes visito Completed: Hospitalized since last visit: No Patient Requires Transmission-Based No Pain Present Now: No Precautions: Patient Has Alerts: No Electronic Signature(s) Signed: 04/08/2016 12:30:35 PM By: Lorine Bears RCP, RRT, CHT Entered By: Lorine Bears on 04/08/2016 08:21:03 Proehl, Gina ([id number]) -------------------------------------------------------------------------------- Encounter Discharge Information Details Patient Name: [name] Date of Service: 04/08/2016 8:00 AM Medical Record Number: [id number] Patient Account Number: [id number] Date of Birth/Sex: Mar 28, 1955 (61 y.o. Male) Treating RN: Primary Care Chealsea Paske: Annye Asa Other Clinician: Jacqulyn Bath Referring Maela Takeda: Annye Asa Treating Kimberlie Csaszar/Extender: Frann Rider in Treatment: 5 Encounter Discharge Information Items Discharge Pain Level: 0 Discharge Condition: Stable Ambulatory Status: Ambulatory Discharge Destination:  Home Private Transportation: Auto Accompanied By: self Schedule Follow-up Appointment: No Medication Reconciliation completed and No provided to Patient/Care Marylu Dudenhoeffer: Clinical Summary of Care: Notes Patient has an HBO treatment scheduled on 04/09/16 at 08:00 am. Electronic Signature(s) Signed: 04/08/2016 12:30:35 PM By: Lorine Bears RCP, RRT, CHT Entered By: Lorine Bears on 04/08/2016 10:40:42 Warning, Kendra ([id number]) -------------------------------------------------------------------------------- Vitals Details Patient Name: [name] Date of Service: 04/08/2016 8:00 AM Medical Record Number: [id number] Patient Account Number: [id number] Date of Birth/Sex: 1955-10-05 (61 y.o. Male) Treating RN: Primary Care [place]: Annye Asa Other Clinician: Jacqulyn Bath Referring Isabele Lollar: Annye Asa Treating Alisen Marsiglia/Extender: Frann Rider in Treatment: 5 Vital Signs Time Taken: 07:50 Temperature (F): 98.2 Height (in): 65 Pulse (bpm): 66 Weight (lbs): 168 Respiratory Rate (breaths/min): 16 Body Mass Index (BMI): 28 Blood Pressure (mmHg): 132/88 Reference Range: 80 - 120 mg / dl Electronic Signature(s) Signed: 04/08/2016 12:30:35 PM By: Lorine Bears RCP, RRT, CHT Entered By: Becky Sax, Amado Nash on 04/08/2016 08:21:27

## 2016-04-10 NOTE — Progress Notes (Signed)
ANTRONE, WALLA ([id number]) Visit Report for 04/09/2016 Arrival Information Details Patient Name: [name], [name] Date of Service: 04/09/2016 8:00 AM Medical Record Number: [id number] Patient Account Number: [id number] Date of Birth/Sex: [birth year] (61 y.o. Male) Treating RN: Primary Care Abbigale Mcelhaney: Annye Asa Other Clinician: Jacqulyn Bath Referring Maudy Yonan: Annye Asa Treating Doneisha Ivey/Extender: Frann Rider in Treatment: 5 Visit Information History Since Last Visit Added or deleted any medications: No Patient Arrived: Ambulatory Any new allergies or adverse reactions: No Arrival Time: 07:50 Signs or symptoms of abuse/neglect since last No Accompanied By: self visito Transfer Assistance: None Hospitalized since last visit: No Patient Identification Verified: Yes Pain Present Now: No Secondary Verification Process Yes Completed: Patient Requires Transmission-Based No Precautions: Patient Has Alerts: No Electronic Signature(s) Signed: 04/09/2016 10:12:18 AM By: Lorine Bears RCP, RRT, CHT Entered By: Lorine Bears on 04/09/2016 08:06:32 Helling, Jaceion ([id number]) -------------------------------------------------------------------------------- Encounter Discharge Information Details Patient Name: [name] Date of Service: 04/09/2016 8:00 AM Medical Record Number: [id number] Patient Account Number: [id number] Date of Birth/Sex: Jan 04, 1956 (61 y.o. Male) Treating RN: Primary Care Jocelyne Reinertsen: Annye Asa Other Clinician: Jacqulyn Bath Referring Yanely Mast: Annye Asa Treating Beryl Hornberger/Extender: Frann Rider in Treatment: 5 Encounter Discharge Information Items Discharge Pain Level: 0 Discharge Condition: Stable Ambulatory Status: Ambulatory Discharge Destination: Home Private Transportation: Auto Accompanied By: self Schedule Follow-up Appointment: No Medication Reconciliation completed  and No provided to Patient/Care Macrina Lehnert: Clinical Summary of Care: Notes Patient has an HBO treatment scheduled on 04/12/16 at 08:00 am. Electronic Signature(s) Signed: 04/09/2016 10:12:18 AM By: Lorine Bears RCP, RRT, CHT Entered By: Lorine Bears on 04/09/2016 10:11:54 [name], [name] ([id number]) -------------------------------------------------------------------------------- Vitals Details Patient Name: [name] Date of Service: 04/09/2016 8:00 AM Medical Record Number: [id number] Patient Account Number: [id number] Date of Birth/Sex: [birth year] (61 y.o. Male) Treating RN: Primary Care Ardys H[place]: Annye Asa Other Clinician: Jacqulyn Bath Referring Iver Miklas: Annye Asa Treating Hallel Denherder/Extender: Frann Rider in Treatment: 5 Vital Signs Time Taken: 07:50 Temperature (F): 97.9 Height (in): 65 Pulse (bpm): 72 Weight (lbs): 168 Respiratory Rate (breaths/min): 16 Body Mass Index (BMI): 28 Blood Pressure (mmHg): 142/84 Reference Range: 80 - 120 mg / dl Electronic Signature(s) Signed: 04/09/2016 10:12:18 AM By: Lorine Bears RCP, RRT, CHT Entered By: Becky Sax, Amado Nash on 04/09/2016 08:07:22

## 2016-04-10 NOTE — Progress Notes (Signed)
[name], [name] ([id number]) Visit Report for 04/09/2016 HBO Details Patient Name: [name], [name] Date of Service: 04/09/2016 8:00 AM Medical Record Number: [id number] Patient Account Number: [id number] Date of Birth/Sex: [birth year] (61 y.o. 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 HBO Treatment Details Treatment Number: 27 Patient Type: Outpatient Chamber Type: Monoplace Chamber Serial #: [id number] Treatment Protocol: 2.0 ATA with 90 minutes oxygen, and no air breaks Treatment Details Compression Rate Down: 1.5 psi / minute De-Compression Rate Up: 1.5 psi / minute Air breaks and breathing Compress Tx Pressure periods Decompress Decompress Begins Reached (leave unused spaces Begins Ends blank) Chamber Pressure (ATA) 1 2 - - - - - - 2 1 Clock Time (24 hr) 08:03 08:15 - - - - - - 09:45 09:55 Treatment Length: 112 (minutes) Treatment Segments: 4 Capillary Blood Glucose Pre Capillary Blood Glucose (mg/dl): Post Capillary Blood Glucose (mg/dl): Vital Signs Capillary Blood Glucose Reference Range: 80 - 120 mg / dl HBO Diabetic Blood Glucose Intervention Range: <131 mg/dl or >249 mg/dl Time Vitals Blood Respiratory Capillary Blood Glucose Pulse Action Type: Pulse: Temperature: Taken: Pressure: Rate: Glucose (mg/dl): Meter #: Oximetry (%) Taken: Pre 07:50 142/84 72 16 97.9 Post 09:58 142/100 60 16 98.3 Treatment Response Treatment Completion Status: Treatment Completed without Adverse Event [name], [name] ([id number]) HBO Attestation I certify that I supervised this HBO treatment in accordance with Medicare  guidelines. A trained Yes emergency response team is readily available per hospital policies and procedures. Continue HBOT as ordered. Yes Electronic Signature(s) Signed: 04/09/2016 10:15:12 AM By: Christin Fudge MD, FACS Previous Signature: 04/09/2016 10:12:18 AM Version By: Lorine Bears RCP, RRT, CHT Entered By: Christin Fudge on 04/09/2016 10:15:12 [name], [name] ([id number]) -------------------------------------------------------------------------------- HBO Safety Checklist Details Patient Name: [name] Date of Service: 04/09/2016 8:00 AM Medical Record Number: [id number] Patient Account Number: [id number] Date of Birth/Sex: 17-Nov-1955 (62 y.o. Male) Treating RN: Primary Care Taimur Fier: Annye Asa Other Clinician: Jacqulyn Bath Referring Merlin Ege: Annye Asa Treating Hopie Pellegrin/Extender: Frann Rider in Treatment: 5 HBO Safety Checklist Items Safety Checklist Consent Form Signed Patient voided / foley secured and emptied When did you last eato 04/08/16 pm Last dose of injectable or oral agent n/a NA Ostomy pouch emptied and vented if applicable NA All implantable devices assessed, documented and approved NA Intravenous access site secured and place Valuables secured Linens and cotton and cotton/polyester blend (less than 51% polyester) Personal oil-based products / skin lotions / body lotions removed NA Wigs or hairpieces removed NA Smoking or tobacco materials removed Books / newspapers / magazines / loose paper removed Cologne, aftershave, perfume and deodorant removed Jewelry removed (may wrap wedding band) NA Make-up removed Hair care products removed Battery operated devices (external) removed Heating patches and chemical warmers removed NA Titanium eyewear removed NA Nail polish cured greater than 10 hours NA Casting material cured greater than 10 hours NA Hearing aids removed Loose dentures or partials removed NA  Prosthetics have been removed Patient demonstrates correct use of air break device (if applicable) Patient concerns have been addressed Patient grounding bracelet on and cord attached to chamber Specifics for Inpatients (complete in addition to above) Medication sheet sent with patient Intravenous medications needed or due during therapy sent with patient [name], [name] ([id number]) Drainage  tubes (e.g. nasogastric tube or chest tube secured and vented) Endotracheal or Tracheotomy tube secured Cuff deflated of air and inflated with saline Airway suctioned Electronic Signature(s) Signed: 04/09/2016 10:12:18 AM By: Lorine Bears RCP, RRT, CHT Entered By: Becky Sax, Amado Nash on 04/09/2016 00:17:49

## 2016-04-12 ENCOUNTER — Encounter: Payer: Federal, State, Local not specified - PPO | Admitting: Surgery

## 2016-04-12 DIAGNOSIS — N3041 Irradiation cystitis with hematuria: Secondary | ICD-10-CM | POA: Diagnosis not present

## 2016-04-12 DIAGNOSIS — L598 Other specified disorders of the skin and subcutaneous tissue related to radiation: Secondary | ICD-10-CM | POA: Diagnosis not present

## 2016-04-12 DIAGNOSIS — Z8546 Personal history of malignant neoplasm of prostate: Secondary | ICD-10-CM | POA: Diagnosis not present

## 2016-04-12 NOTE — Progress Notes (Signed)
[name], [name] ([id number]) Visit Report for 04/12/2016 Arrival Information Details Patient Name: [name], [name] Date of Service: 04/12/2016 8:00 AM Medical Record Number: [id number] Patient Account Number: [id number] Date of Birth/Sex: 1955/04/07 (61 y.o. Male) Treating RN: Primary Care Trew Sunde: Annye Asa Other Clinician: Jacqulyn Bath Referring Greyson Peavy: Annye Asa Treating Sydell Prowell/Extender: Frann Rider in Treatment: 5 Visit Information History Since Last Visit Any new allergies or adverse reactions: No Patient Arrived: Ambulatory Had a fall or experienced change in No Arrival Time: 07:50 activities of daily living that may affect Accompanied By: self risk of falls: Transfer Assistance: None Signs or symptoms of abuse/neglect since last No Patient Identification Verified: Yes visito Secondary Verification Process Yes Hospitalized since last visit: No Completed: Pain Present Now: No Patient Requires Transmission-Based No Precautions: Patient Has Alerts: No Electronic Signature(s) Signed: 04/12/2016 10:32:47 AM By: Lorine Bears RCP, RRT, CHT Entered By: Lorine Bears on 04/12/2016 08:08:13 [name], [name] ([id number]) -------------------------------------------------------------------------------- Encounter Discharge Information Details Patient Name: [name] Date of Service: 04/12/2016 8:00 AM Medical Record Number: [id number] Patient Account Number: [id number] Date of Birth/Sex: [birth year] (61 y.o. Male) Treating RN: Primary Care Mialani Reicks: Annye Asa Other Clinician: Jacqulyn Bath Referring Ardythe Klute: Annye Asa Treating Othello Sgroi/Extender: Frann Rider in Treatment: 5 Encounter Discharge Information Items Discharge Pain Level: 0 Discharge Condition: Stable Ambulatory Status: Ambulatory Discharge Destination: Home Private Transportation: Auto Accompanied By: self Schedule Follow-up  Appointment: No Medication Reconciliation completed and No provided to Patient/Care Ninel Abdella: Clinical Summary of Care: Notes Patient has an HBO treatment scheduled on 04/13/16 at 08:00 am. Electronic Signature(s) Signed: 04/12/2016 10:32:47 AM By: Lorine Bears RCP, RRT, CHT Entered By: Lorine Bears on 04/12/2016 10:32:19 [name], [name] ([id number]) -------------------------------------------------------------------------------- Vitals Details Patient Name: [name] Date of Service: 04/12/2016 8:00 AM Medical Record Number: [id number] Patient Account Number: [id number] Date of Birth/Sex: [birth year] (61 y.o. Male) Treating RN: Primary Care Marcia Lepera: Annye Asa Other Clinician: Jacqulyn Bath Referring Rajendra Spiller: Annye Asa Treating Brigetta Beckstrom/Extender: Frann Rider in Treatment: 5 Vital Signs Time Taken: 07:53 Temperature (F): 98.2 Height (in): 65 Pulse (bpm): 66 Weight (lbs): 168 Respiratory Rate (breaths/min): 16 Body Mass Index (BMI): 28 Blood Pressure (mmHg): 154/90 Reference Range: 80 - 120 mg / dl Electronic Signature(s) Signed: 04/12/2016 10:32:47 AM By: Lorine Bears RCP, RRT, CHT Entered By: Becky Sax, Amado Nash on 04/12/2016 08:08:51

## 2016-04-13 ENCOUNTER — Encounter: Payer: Federal, State, Local not specified - PPO | Admitting: Internal Medicine

## 2016-04-13 DIAGNOSIS — Z8546 Personal history of malignant neoplasm of prostate: Secondary | ICD-10-CM | POA: Diagnosis not present

## 2016-04-13 DIAGNOSIS — N3289 Other specified disorders of bladder: Secondary | ICD-10-CM | POA: Diagnosis not present

## 2016-04-13 DIAGNOSIS — L598 Other specified disorders of the skin and subcutaneous tissue related to radiation: Secondary | ICD-10-CM | POA: Diagnosis not present

## 2016-04-13 DIAGNOSIS — N3041 Irradiation cystitis with hematuria: Secondary | ICD-10-CM | POA: Diagnosis not present

## 2016-04-13 NOTE — Progress Notes (Signed)
[name], [name] ([id number]) Visit Report for 04/12/2016 HBO Details Patient Name: [name], [name] Date of Service: 04/12/2016 8:00 AM Medical Record Number: [id number] Patient Account Number: [id number] Date of Birth/Sex: 11-21-1955 (61 y.o. 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 HBO Treatment Details Treatment Number: 28 Patient Type: Outpatient Chamber Type: Monoplace Chamber Serial #: [id number] Treatment Protocol: 2.0 ATA with 90 minutes oxygen, and no air breaks Treatment Details Compression Rate Down: 1.5 psi / minute De-Compression Rate Up: 1.5 psi / minute Air breaks and breathing Compress Tx Pressure periods Decompress Decompress Begins Reached (leave unused spaces Begins Ends blank) Chamber Pressure (ATA) 1 2 - - - - - - 2 1 Clock Time (24 hr) 08:06 08:18 - - - - - - 09:48 09:59 Treatment Length: 113 (minutes) Treatment Segments: 4 Capillary Blood Glucose Pre Capillary Blood Glucose (mg/dl): Post Capillary Blood Glucose (mg/dl): Vital Signs Capillary Blood Glucose Reference Range: 80 - 120 mg / dl HBO Diabetic Blood Glucose Intervention Range: <131 mg/dl or >249 mg/dl Time Vitals Blood Respiratory Capillary Blood Glucose Pulse Action Type: Pulse: Temperature: Taken: Pressure: Rate: Glucose (mg/dl): Meter #: Oximetry (%) Taken: Pre 07:53 154/90 66 16 98.2 Post 10:00 142/100 66 16 98.1 Treatment Response Treatment Completion Status: Treatment Completed without Adverse Event Arko, Millie ([id number]) Electronic Signature(s) Signed: 04/12/2016 10:32:47 AM By: Lorine Bears RCP, RRT,  CHT Signed: 04/12/2016 4:12:24 PM By: Christin Fudge MD, FACS Previous Signature: 04/12/2016 10:03:35 AM Version By: Christin Fudge MD, FACS Entered By: Lorine Bears on 04/12/2016 10:31:30 Bonsall, Lincoln ([id number]) -------------------------------------------------------------------------------- HBO Safety Checklist Details Patient Name: [name] Date of Service: 04/12/2016 8:00 AM Medical Record Number: [id number] Patient Account Number: [id number] Date of Birth/Sex: [birth year] (61 y.o. Male) Treating RN: Primary Care Sheena Donegan: Annye Asa Other Clinician: Jacqulyn Bath Referring Tricia Oaxaca: Annye Asa Treating Gwenn Teodoro/Extender: Frann Rider in Treatment: 5 HBO Safety Checklist Items Safety Checklist Consent Form Signed Patient voided / foley secured and emptied When did you last eato 04/11/16 pm Last dose of injectable or oral agent n/a NA Ostomy pouch emptied and vented if applicable NA All implantable devices assessed, documented and approved NA Intravenous access site secured and place Valuables secured Linens and cotton and cotton/polyester blend (less than 51% polyester) Personal oil-based products / skin lotions / body lotions removed NA Wigs or hairpieces removed NA Smoking or tobacco materials removed Books / newspapers / magazines / loose paper removed Cologne, aftershave, perfume and deodorant removed Jewelry removed (may wrap wedding band) NA Make-up removed Hair care products removed Battery operated devices (external) removed Heating patches and chemical warmers removed NA Titanium eyewear removed NA Nail polish cured greater than 10 hours NA Casting material cured greater than 10 hours NA Hearing aids removed Loose dentures or partials removed NA Prosthetics have been removed Patient demonstrates correct use of air break device (if applicable) Patient concerns have been addressed Patient grounding bracelet on and cord  attached to chamber Specifics for Inpatients (complete in addition to above) Medication sheet sent with patient Intravenous medications needed or due during therapy sent with patient BYARD, Chinmay ([id number]) Drainage tubes (e.g. nasogastric tube or chest tube secured and vented) Endotracheal or Tracheotomy tube secured Cuff deflated of air and inflated with saline Airway  suctioned Electronic Signature(s) Signed: 04/12/2016 10:32:47 AM By: Lorine Bears RCP, RRT, CHT Entered By: Lorine Bears on 04/12/2016 08:09:51

## 2016-04-14 ENCOUNTER — Encounter: Payer: Federal, State, Local not specified - PPO | Admitting: Internal Medicine

## 2016-04-14 DIAGNOSIS — L598 Other specified disorders of the skin and subcutaneous tissue related to radiation: Secondary | ICD-10-CM | POA: Diagnosis not present

## 2016-04-14 DIAGNOSIS — N3289 Other specified disorders of bladder: Secondary | ICD-10-CM | POA: Diagnosis not present

## 2016-04-14 DIAGNOSIS — N3041 Irradiation cystitis with hematuria: Secondary | ICD-10-CM | POA: Diagnosis not present

## 2016-04-14 DIAGNOSIS — Z8546 Personal history of malignant neoplasm of prostate: Secondary | ICD-10-CM | POA: Diagnosis not present

## 2016-04-14 NOTE — Progress Notes (Signed)
EMILO, GRAS ([id number]) Visit Report for 04/13/2016 Arrival Information Details Patient Name: [name], [name] Date of Service: 04/13/2016 8:00 AM Medical Record Number: [id number] Patient Account Number: [id number] Date of Birth/Sex: 08-Dec-1955 (61 y.o. Male) Treating RN: Primary Care Esteven Overfelt: Annye Asa Other Clinician: Jacqulyn Bath Referring Shalynn Jorstad: Annye Asa Treating Malike Foglio/Extender: Tito Dine in Treatment: 5 Visit Information History Since Last Visit Added or deleted any medications: No Patient Arrived: Ambulatory Any new allergies or adverse reactions: No Arrival Time: 07:50 Had a fall or experienced change in No Accompanied By: self activities of daily living that may affect Transfer Assistance: None risk of falls: Patient Identification Verified: Yes Signs or symptoms of abuse/neglect since last No Secondary Verification Process Yes visito Completed: Hospitalized since last visit: No Patient Requires Transmission-Based No Pain Present Now: No Precautions: Patient Has Alerts: No Electronic Signature(s) Signed: 04/13/2016 10:04:06 AM By: Lorine Bears RCP, RRT, CHT Entered By: Lorine Bears on 04/13/2016 08:03:37 [name] ([id number]) -------------------------------------------------------------------------------- Encounter Discharge Information Details Patient Name: [name] Date of Service: 04/13/2016 8:00 AM Medical Record Number: [id number] Patient Account Number: [id number] Date of Birth/Sex: 02/12/1955 (61 y.o. Male) Treating RN: Primary Care Yukari Flax: Annye Asa Other Clinician: Jacqulyn Bath Referring Nevaeh Korte: Annye Asa Treating Sherran Margolis/Extender: Tito Dine in Treatment: 5 Encounter Discharge Information Items Discharge Pain Level: 0 Discharge Condition: Stable Ambulatory Status: Ambulatory Discharge Destination:  Home Private Transportation: Auto Accompanied By: self Schedule Follow-up Appointment: No Medication Reconciliation completed and No provided to Patient/Care Ambra Haverstick: Clinical Summary of Care: Notes Patient has an HBO treatment scheduled on 04/14/16 at 08:00 am. Electronic Signature(s) Signed: 04/13/2016 10:04:06 AM By: Lorine Bears RCP, RRT, CHT Entered By: Lorine Bears on 04/13/2016 10:03:46 [name] ([id number]) -------------------------------------------------------------------------------- Vitals Details Patient Name: [name] Date of Service: 04/13/2016 8:00 AM Medical Record Number: [id number] Patient Account Number: [id number] Date of Birth/Sex: 1955-10-17 (61 y.o. Male) Treating RN: Primary Care Kristi Norment: Annye Asa Other Clinician: Jacqulyn Bath Referring Zadaya Cuadra: Annye Asa Treating Posey Petrik/Extender: Ricard Dillon Weeks in Treatment: 5 Vital Signs Time Taken: 07:50 Temperature (F): 98.5 Height (in): 65 Pulse (bpm): 72 Weight (lbs): 168 Respiratory Rate (breaths/min): 16 Body Mass Index (BMI): 28 Blood Pressure (mmHg): 132/90 Reference Range: 80 - 120 mg / dl Electronic Signature(s) Signed: 04/13/2016 10:04:06 AM By: Lorine Bears RCP, RRT, CHT Entered By: [name] on 04/13/2016 08:04:09

## 2016-04-15 ENCOUNTER — Encounter: Payer: Federal, State, Local not specified - PPO | Admitting: Surgery

## 2016-04-15 DIAGNOSIS — Z8546 Personal history of malignant neoplasm of prostate: Secondary | ICD-10-CM | POA: Diagnosis not present

## 2016-04-15 DIAGNOSIS — N3289 Other specified disorders of bladder: Secondary | ICD-10-CM | POA: Diagnosis not present

## 2016-04-15 DIAGNOSIS — L598 Other specified disorders of the skin and subcutaneous tissue related to radiation: Secondary | ICD-10-CM | POA: Diagnosis not present

## 2016-04-15 DIAGNOSIS — N3041 Irradiation cystitis with hematuria: Secondary | ICD-10-CM | POA: Diagnosis not present

## 2016-04-15 NOTE — Progress Notes (Signed)
[name], [name] ([id number]) Visit Report for 04/14/2016 Arrival Information Details Patient Name: [name], [name] Date of Service: 04/14/2016 8:00 AM Medical Record Number: [id number] Patient Account Number: [id number] Date of Birth/Sex: 1955/08/07 (61 y.o. Male) Treating RN: Primary Care Shawnte Winton: Annye Asa Other Clinician: Jacqulyn Bath Referring Anetra Czerwinski: Annye Asa Treating Jhace Fennell/Extender: Tito Dine in Treatment: 6 Visit Information History Since Last Visit Added or deleted any medications: No Patient Arrived: Ambulatory Any new allergies or adverse reactions: No Arrival Time: 07:50 Had a fall or experienced change in No Accompanied By: self activities of daily living that may affect Transfer Assistance: None risk of falls: Patient Identification Verified: Yes Signs or symptoms of abuse/neglect since last No Secondary Verification Process Yes visito Completed: Hospitalized since last visit: No Patient Requires Transmission-Based No Pain Present Now: No Precautions: Patient Has Alerts: No Electronic Signature(s) Signed: 04/14/2016 10:12:24 AM By: Lorine Bears RCP, RRT, CHT Entered By: Becky Sax, Amado Nash on 04/14/2016 08:07:41 [name], [name] ([id number]) -------------------------------------------------------------------------------- Encounter Discharge Information Details Patient Name: [name] Date of Service: 04/14/2016 8:00 AM Medical Record Number: [id number] Patient Account Number: [id number] Date of Birth/Sex: 1955-01-13 (61 y.o. Male) Treating RN: Primary Care Kameryn Tisdel: Annye Asa Other Clinician: Jacqulyn Bath Referring Teiara Baria: Annye Asa Treating Indiyah Paone/Extender: Tito Dine in Treatment: 6 Encounter Discharge Information Items Discharge Pain Level: 0 Discharge Condition: Stable Ambulatory Status: Ambulatory Discharge Destination:  Home Private Transportation: Auto Accompanied By: self Schedule Follow-up Appointment: No Medication Reconciliation completed and No provided to Patient/Care Donell Tomkins: Clinical Summary of Care: Notes Patient has an HBO treatment scheduled on 04/15/16 at 08:00 am. Electronic Signature(s) Signed: 04/14/2016 10:12:24 AM By: Lorine Bears RCP, RRT, CHT Entered By: Lorine Bears on 04/14/2016 10:12:07 [name] ([id number]) -------------------------------------------------------------------------------- Vitals Details Patient Name: [name] Date of Service: 04/14/2016 8:00 AM Medical Record Number: [id number] Patient Account Number: [id number] Date of Birth/Sex: Nov 04, 1955 (61 y.o. Male) Treating RN: Primary Care Imani Sherrin: Annye Asa Other Clinician: Jacqulyn Bath Referring Shamecka Hocutt: Annye Asa Treating Marla Pouliot/Extender: Ricard Dillon Weeks in Treatment: 6 Vital Signs Time Taken: 07:53 Temperature (F): 98.1 Height (in): 65 Pulse (bpm): 72 Weight (lbs): 168 Respiratory Rate (breaths/min): 16 Body Mass Index (BMI): 28 Blood Pressure (mmHg): 158/84 Reference Range: 80 - 120 mg / dl Electronic Signature(s) Signed: 04/14/2016 10:12:24 AM By: Lorine Bears RCP, RRT, CHT Entered By: Becky Sax, Amado Nash on 04/14/2016 08:08:13

## 2016-04-15 NOTE — Progress Notes (Signed)
[name], [name] ([id number]) Visit Report for 04/13/2016 HBO Details Patient Name: [name], [name] Date of Service: 04/13/2016 8:00 AM Medical Record Number: [id number] Patient Account Number: [id number] Date of Birth/Sex: 1955/04/16 (61 y.o. 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 HBO Treatment Details Treatment Number: 29 Patient Type: Outpatient Chamber Type: Monoplace Chamber Serial #: [id number] Treatment Protocol: 2.0 ATA with 90 minutes oxygen, and no air breaks Treatment Details Compression Rate Down: 1.5 psi / minute De-Compression Rate Up: 1.5 psi / minute Air breaks and breathing Compress Tx Pressure periods Decompress Decompress Begins Reached (leave unused spaces Begins Ends blank) Chamber Pressure (ATA) 1 2 - - - - - - 2 1 Clock Time (24 hr) 08:02 08:14 - - - - - - 09:44 09:54 Treatment Length: 112 (minutes) Treatment Segments: 4 Capillary Blood Glucose Pre Capillary Blood Glucose (mg/dl): Post Capillary Blood Glucose (mg/dl): Vital Signs Capillary Blood Glucose Reference Range: 80 - 120 mg / dl HBO Diabetic Blood Glucose Intervention Range: <131 mg/dl or >249 mg/dl Time Vitals Blood Respiratory Capillary Blood Glucose Pulse Action Type: Pulse: Temperature: Taken: Pressure: Rate: Glucose (mg/dl): Meter #: Oximetry (%) Taken: Pre 07:50 132/90 72 16 98.5 Post 09:55 146/98 72 16 98.4 Treatment Response Treatment Completion Status: Treatment Completed without Adverse Event Omary, Kenyetta ([id number]) Aissa Lisowski Notes No concern with treatment given HBO Attestation I certify that I supervised  this HBO treatment in accordance with Medicare guidelines. A trained Yes emergency response team is readily available per hospital policies and procedures. Continue HBOT as ordered. Yes Electronic Signature(s) Signed: 04/13/2016 5:28:32 PM By: Linton Ham MD Previous Signature: 04/13/2016 10:04:06 AM Version By: Lorine Bears RCP, RRT, CHT Entered By: Linton Ham on 04/13/2016 17:27:09 Detjen, Ken ([id number]) -------------------------------------------------------------------------------- HBO Safety Checklist Details Patient Name: [name] Date of Service: 04/13/2016 8:00 AM Medical Record Number: [id number] Patient Account Number: [id number] Date of Birth/Sex: 1955/03/03 (61 y.o. Male) Treating RN: Primary Care Jassmin Kemmerer: Annye Asa Other Clinician: Jacqulyn Bath Referring Reeda Soohoo: Annye Asa Treating Maliki Gignac/Extender: Tito Dine in Treatment: 5 HBO Safety Checklist Items Safety Checklist Consent Form Signed Patient voided / foley secured and emptied When did you last eato 04/12/16 pm Last dose of injectable or oral agent NA Ostomy pouch emptied and vented if applicable NA All implantable devices assessed, documented and approved NA Intravenous access site secured and place Valuables secured Linens and cotton and cotton/polyester blend (less than 51% polyester) Personal oil-based products / skin lotions / body lotions removed NA Wigs or hairpieces removed NA Smoking or tobacco materials removed Books / newspapers / magazines / loose paper removed Cologne, aftershave, perfume and deodorant removed Jewelry removed (may wrap wedding band) NA Make-up removed Hair care products removed Battery operated devices (external) removed Heating patches and chemical warmers removed NA Titanium eyewear removed NA Nail polish cured greater than 10 hours NA Casting material cured greater than 10 hours NA Hearing aids  removed Loose dentures or partials removed NA Prosthetics have been removed Patient demonstrates correct use of air break device (if applicable) Patient concerns have been addressed Patient grounding bracelet on and cord attached to chamber Specifics for Inpatients (complete in addition to above) Medication sheet sent with patient Intravenous medications needed or due during therapy  sent with patient JORGEN, WOLFINGER ([id number]) Drainage tubes (e.g. nasogastric tube or chest tube secured and vented) Endotracheal or Tracheotomy tube secured Cuff deflated of air and inflated with saline Airway suctioned Electronic Signature(s) Signed: 04/13/2016 10:04:06 AM By: Lorine Bears RCP, RRT, CHT Entered By: Becky Sax, Amado Nash on 04/13/2016 08:06:09

## 2016-04-16 ENCOUNTER — Encounter: Payer: Federal, State, Local not specified - PPO | Admitting: Surgery

## 2016-04-16 DIAGNOSIS — Z8546 Personal history of malignant neoplasm of prostate: Secondary | ICD-10-CM | POA: Diagnosis not present

## 2016-04-16 DIAGNOSIS — L598 Other specified disorders of the skin and subcutaneous tissue related to radiation: Secondary | ICD-10-CM | POA: Diagnosis not present

## 2016-04-16 DIAGNOSIS — N3041 Irradiation cystitis with hematuria: Secondary | ICD-10-CM | POA: Diagnosis not present

## 2016-04-16 DIAGNOSIS — N3289 Other specified disorders of bladder: Secondary | ICD-10-CM | POA: Diagnosis not present

## 2016-04-16 NOTE — Progress Notes (Signed)
[name], [name] ([id number]) Visit Report for 04/14/2016 HBO Details Patient Name: [name], [name] Date of Service: 04/14/2016 8:00 AM Medical Record Number: [id number] Patient Account Number: [id number] Date of Birth/Sex: 07-Jun-1955 (61 y.o. 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 HBO Treatment Details Treatment Number: 30 Patient Type: Outpatient Chamber Type: Monoplace Chamber Serial #: [id number] Treatment Protocol: 2.0 ATA with 90 minutes oxygen, and no air breaks Treatment Details Compression Rate Down: 1.5 psi / minute De-Compression Rate Up: 1.5 psi / minute Air breaks and breathing Compress Tx Pressure periods Decompress Decompress Begins Reached (leave unused spaces Begins Ends blank) Chamber Pressure (ATA) 1 2 - - - - - - 2 1 Clock Time (24 hr) 08:06 08:18 - - - - - - 09:48 09:58 Treatment Length: 112 (minutes) Treatment Segments: 4 Capillary Blood Glucose Pre Capillary Blood Glucose (mg/dl): Post Capillary Blood Glucose (mg/dl): Vital Signs Capillary Blood Glucose Reference Range: 80 - 120 mg / dl HBO Diabetic Blood Glucose Intervention Range: <131 mg/dl or >249 mg/dl Time Vitals Blood Respiratory Capillary Blood Glucose Pulse Action Type: Pulse: Temperature: Taken: Pressure: Rate: Glucose (mg/dl): Meter #: Oximetry (%) Taken: Pre 07:53 158/84 72 16 98.1 Post 10:00 152/88 60 16 98.1 Treatment Response Treatment Completion Status: Treatment Completed without Adverse Event [name], [name] ([id number]) Dezirae Service Notes No concerns with treatment given HBO Attestation I certify that I supervised  this HBO treatment in accordance with Medicare guidelines. A trained Yes emergency response team is readily available per hospital policies and procedures. Continue HBOT as ordered. Yes Electronic Signature(s) Signed: 04/14/2016 5:13:56 PM By: Linton Ham MD Previous Signature: 04/14/2016 10:12:24 AM Version By: Lorine Bears RCP, RRT, CHT Entered By: Linton Ham on 04/14/2016 17:12:49 [name], [name] ([id number]) -------------------------------------------------------------------------------- HBO Safety Checklist Details Patient Name: [name] Date of Service: 04/14/2016 8:00 AM Medical Record Number: [id number] Patient Account Number: [id number] Date of Birth/Sex: 01-12-1955 (61 y.o. Male) Treating RN: Primary Care Keithen Capo: Annye Asa Other Clinician: Jacqulyn Bath Referring Alonia Dibuono: Annye Asa Treating Chelisa Hennen/Extender: Tito Dine in Treatment: 6 HBO Safety Checklist Items Safety Checklist Consent Form Signed Patient voided / foley secured and emptied When did you last eato 04/13/16 pm Last dose of injectable or oral agent n/a NA Ostomy pouch emptied and vented if applicable NA All implantable devices assessed, documented and approved NA Intravenous access site secured and place Valuables secured Linens and cotton and cotton/polyester blend (less than 51% polyester) Personal oil-based products / skin lotions / body lotions removed NA Wigs or hairpieces removed NA Smoking or tobacco materials removed Books / newspapers / magazines / loose paper removed Cologne, aftershave, perfume and deodorant removed Jewelry removed (may wrap wedding band) NA Make-up removed Hair care products removed Battery operated devices (external) removed Heating patches and chemical warmers removed NA Titanium eyewear removed NA Nail polish cured greater than 10 hours NA Casting material cured greater than 10 hours NA Hearing aids  removed Loose dentures or partials removed NA Prosthetics have been removed Patient demonstrates correct use of air break device (if applicable) Patient concerns have been addressed Patient grounding bracelet on and cord attached to chamber Specifics for Inpatients (complete in addition to above) Medication sheet sent with patient Intravenous medications needed or due during  therapy sent with patient [name], [name] ([id number]) Drainage tubes (e.g. nasogastric tube or chest tube secured and vented) Endotracheal or Tracheotomy tube secured Cuff deflated of air and inflated with saline Airway suctioned Electronic Signature(s) Signed: 04/14/2016 10:12:24 AM By: Lorine Bears RCP, RRT, CHT Entered By: Lorine Bears on 04/14/2016 08:09:11

## 2016-04-16 NOTE — Progress Notes (Signed)
KEMARI, NAREZ ([id number]) Visit Report for 04/15/2016 Arrival Information Details Patient Name: [name], [name] Date of Service: 04/15/2016 8:00 AM Medical Record Number: [id number] Patient Account Number: [id number] Date of Birth/Sex: 03-20-1955 (61 y.o. Male) Treating RN: Primary Care Jahmiya Guidotti: Annye Asa Other Clinician: Jacqulyn Bath Referring Makenlee Mckeag: Annye Asa Treating Earlisha Sharples/Extender: Frann Rider in Treatment: 6 Visit Information History Since Last Visit Added or deleted any medications: No Patient Arrived: Ambulatory Any new allergies or adverse reactions: No Arrival Time: 07:50 Had a fall or experienced change in No Accompanied By: self activities of daily living that may affect Transfer Assistance: None risk of falls: Patient Identification Verified: Yes Hospitalized since last visit: No Secondary Verification Process Yes Pain Present Now: No Completed: Patient Requires Transmission-Based No Precautions: Patient Has Alerts: No Electronic Signature(s) Signed: 04/15/2016 10:12:12 AM By: Lorine Bears RCP, RRT, CHT Entered By: Becky Sax, Amado Nash on 04/15/2016 07:54:10 Shenk, Kristion ([id number]) -------------------------------------------------------------------------------- Encounter Discharge Information Details Patient Name: [name] Date of Service: 04/15/2016 8:00 AM Medical Record Number: [id number] Patient Account Number: [id number] Date of Birth/Sex: 1955/09/24 (61 y.o. Male) Treating RN: Primary Care Malee Grays: Annye Asa Other Clinician: Jacqulyn Bath Referring Taqwa Deem: Annye Asa Treating Lenix Benoist/Extender: Frann Rider in Treatment: 6 Encounter Discharge Information Items Discharge Pain Level: 0 Discharge Condition: Stable Ambulatory Status: Ambulatory Discharge Destination: Home Private Transportation: Auto Accompanied By: self Schedule Follow-up Appointment:  No Medication Reconciliation completed and No provided to Patient/Care Jurnei Latini: Clinical Summary of Care: Notes Patient has an HBO treatment scheduled on 04/16/16 at 08:00 am. Electronic Signature(s) Signed: 04/15/2016 10:12:12 AM By: Lorine Bears RCP, RRT, CHT Entered By: Lorine Bears on 04/15/2016 10:11:50 Ixonia, Marlyn ([id number]) -------------------------------------------------------------------------------- Vitals Details Patient Name: [name] Date of Service: 04/15/2016 8:00 AM Medical Record Number: [id number] Patient Account Number: [id number] Date of Birth/Sex: 1955-06-29 (61 y.o. Male) Treating RN: Primary Care Bacilio Abascal: Annye Asa Other Clinician: Jacqulyn Bath Referring Daya Dutt: Annye Asa Treating Keyshawn Hellwig/Extender: Frann Rider in Treatment: 6 Vital Signs Time Taken: 07:50 Temperature (F): 98.3 Height (in): 65 Pulse (bpm): 66 Weight (lbs): 168 Respiratory Rate (breaths/min): 16 Body Mass Index (BMI): 28 Blood Pressure (mmHg): 152/83 Reference Range: 80 - 120 mg / dl Electronic Signature(s) Signed: 04/15/2016 10:12:12 AM By: Lorine Bears RCP, RRT, CHT Entered By: Becky Sax, Amado Nash on 04/15/2016 08:21:48

## 2016-04-17 NOTE — Progress Notes (Signed)
KYROLLOS, CORDELL ([id number]) Visit Report for 04/15/2016 HBO Details Patient Name: GUST, EUGENE Date of Service: 04/15/2016 8:00 AM Medical Record Number: [id number] Patient Account Number: [id number] Date of Birth/Sex: 11/16/1955 (61 y.o. 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 HBO Treatment Details Treatment Number: 31 Patient Type: Outpatient Chamber Type: Monoplace Chamber Serial #: [id number] Treatment Protocol: 2.0 ATA with 90 minutes oxygen, and no air breaks Treatment Details Compression Rate Down: 1.5 psi / minute De-Compression Rate Up: Air breaks and breathing Compress Tx Pressure periods Decompress Decompress Begins Reached (leave unused spaces Begins Ends blank) Chamber Pressure (ATA) 1 2 - - - - - - 2 1 Clock Time (24 hr) 08:08 08:20 - - - - - - 09:50 10:00 Treatment Length: 112 (minutes) Treatment Segments: 4 Capillary Blood Glucose Pre Capillary Blood Glucose (mg/dl): Post Capillary Blood Glucose (mg/dl): Vital Signs Capillary Blood Glucose Reference Range: 80 - 120 mg / dl HBO Diabetic Blood Glucose Intervention Range: <131 mg/dl or >249 mg/dl Time Vitals Blood Respiratory Capillary Blood Glucose Pulse Action Type: Pulse: Temperature: Taken: Pressure: Rate: Glucose (mg/dl): Meter #: Oximetry (%) Taken: Pre 07:50 152/83 66 16 98.3 Post 10:02 148/98 60 16 98.6 Treatment Response Treatment Completion Status: Treatment Completed without Adverse Event Alcaraz, Jammy ([id number]) Electronic Signature(s) Signed: 04/15/2016 10:12:12 AM By: Lorine Bears RCP, RRT, CHT Signed:  04/15/2016 2:17:24 PM By: Christin Fudge MD, FACS Previous Signature: 04/15/2016 10:03:54 AM Version By: Christin Fudge MD, FACS Entered By: Lorine Bears on 04/15/2016 10:10:51 Basurto, Javarie ([id number]) -------------------------------------------------------------------------------- HBO Safety Checklist Details Patient Name: [name] Date of Service: 04/15/2016 8:00 AM Medical Record Number: [id number] Patient Account Number: [id number] Date of Birth/Sex: Jun 03, 1955 (61 y.o. Male) Treating RN: Primary Care Demaria Deeney: Annye Asa Other Clinician: Jacqulyn Bath Referring Tamara Monteith: Annye Asa Treating Noelle Hoogland/Extender: Frann Rider in Treatment: 6 HBO Safety Checklist Items Safety Checklist Consent Form Signed Patient voided / foley secured and emptied When did you last eato 04/14/16 pm Last dose of injectable or oral agent n/a NA Ostomy pouch emptied and vented if applicable NA All implantable devices assessed, documented and approved NA Intravenous access site secured and place Valuables secured Linens and cotton and cotton/polyester blend (less than 51% polyester) Personal oil-based products / skin lotions / body lotions removed NA Wigs or hairpieces removed NA Smoking or tobacco materials removed Books / newspapers / magazines / loose paper removed Cologne, aftershave, perfume and deodorant removed Jewelry removed (may wrap wedding band) NA Make-up removed Hair care products removed Battery operated devices (external) removed Heating patches and chemical warmers removed NA Titanium eyewear removed NA Nail polish cured greater than 10 hours NA Casting material cured greater than 10 hours NA Hearing aids removed Loose dentures or partials removed NA Prosthetics have been removed Patient demonstrates correct use of air break device (if applicable) Patient concerns have been addressed Patient grounding bracelet on and cord attached to  chamber Specifics for Inpatients (complete in addition to above) Medication sheet sent with patient Intravenous medications needed or due during therapy sent with patient COCHRANE, Martice ([id number]) Drainage tubes (e.g. nasogastric tube or chest tube secured and vented) Endotracheal or Tracheotomy tube secured Cuff deflated of air and inflated with saline Airway suctioned Electronic Signature(s) Signed:  04/15/2016 10:12:12 AM By: Lorine Bears RCP, RRT, CHT Entered By: Lorine Bears on 04/15/2016 08:23:51

## 2016-04-18 NOTE — Progress Notes (Signed)
GOVANNI, PLEMONS ([id number]) Visit Report for 04/16/2016 HBO Details Patient Name: KWINTON, MAAHS Date of Service: 04/16/2016 8:00 AM Medical Record Number: [id number] Patient Account Number: [id number] Date of Birth/Sex: May 27, 1955 (61 y.o. 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 HBO Treatment Details Treatment Number: 32 Patient Type: Outpatient Chamber Type: Monoplace Chamber Serial #: [id number] Treatment Protocol: 2.0 ATA with 90 minutes oxygen, and no air breaks Treatment Details Compression Rate Down: 1.5 psi / minute De-Compression Rate Up: 1.5 psi / minute Air breaks and breathing Compress Tx Pressure periods Decompress Decompress Begins Reached (leave unused spaces Begins Ends blank) Chamber Pressure (ATA) 1 2 - - - - - - 2 1 Clock Time (24 hr) 08:02 08:14 - - - - - - 09:44 09:55 Treatment Length: 113 (minutes) Treatment Segments: 4 Capillary Blood Glucose Pre Capillary Blood Glucose (mg/dl): Post Capillary Blood Glucose (mg/dl): Vital Signs Capillary Blood Glucose Reference Range: 80 - 120 mg / dl HBO Diabetic Blood Glucose Intervention Range: <131 mg/dl or >249 mg/dl Time Vitals Blood Respiratory Capillary Blood Glucose Pulse Action Type: Pulse: Temperature: Taken: Pressure: Rate: Glucose (mg/dl): Meter #: Oximetry (%) Taken: Pre 07:50 132/86 72 16 98.3 Post 09:57 162/100 78 16 98.3 Treatment Response Treatment Completion Status: Treatment Completed without Adverse Event Bessinger, Davante ([id number]) HBO Attestation I certify that I supervised this HBO treatment in accordance with Medicare  guidelines. A trained Yes emergency response team is readily available per hospital policies and procedures. Continue HBOT as ordered. Yes Electronic Signature(s) Signed: 04/16/2016 10:20:13 AM By: Christin Fudge MD, FACS Previous Signature: 04/16/2016 10:06:37 AM Version By: Lorine Bears RCP, RRT, CHT Entered By: Christin Fudge on 04/16/2016 10:20:13 Ascencio, Hamdi ([id number]) -------------------------------------------------------------------------------- HBO Safety Checklist Details Patient Name: [name] Date of Service: 04/16/2016 8:00 AM Medical Record Number: [id number] Patient Account Number: [id number] Date of Birth/Sex: 02-04-1955 (61 y.o. Male) Treating RN: Primary Care Brenya Taulbee: Annye Asa Other Clinician: Jacqulyn Bath Referring Soha Thorup: Annye Asa Treating Jeriyah Granlund/Extender: Frann Rider in Treatment: 6 HBO Safety Checklist Items Safety Checklist Consent Form Signed Patient voided / foley secured and emptied When did you last eato 04/15/16 pm Last dose of injectable or oral agent n/a NA Ostomy pouch emptied and vented if applicable NA All implantable devices assessed, documented and approved NA Intravenous access site secured and place Valuables secured Linens and cotton and cotton/polyester blend (less than 51% polyester) Personal oil-based products / skin lotions / body lotions removed NA Wigs or hairpieces removed NA Smoking or tobacco materials removed Books / newspapers / magazines / loose paper removed Cologne, aftershave, perfume and deodorant removed Jewelry removed (may wrap wedding band) NA Make-up removed Hair care products removed Battery operated devices (external) removed Heating patches and chemical warmers removed NA Titanium eyewear removed NA Nail polish cured greater than 10 hours NA Casting material cured greater than 10 hours NA Hearing aids removed Loose dentures or partials removed NA  Prosthetics have been removed Patient demonstrates correct use of air break device (if applicable) Patient concerns have been addressed Patient grounding bracelet on and cord attached to chamber Specifics for Inpatients (complete in addition to above) Medication sheet sent with patient Intravenous medications needed or due during therapy sent with patient DUROSS, Tyrin ([id number]) Drainage  tubes (e.g. nasogastric tube or chest tube secured and vented) Endotracheal or Tracheotomy tube secured Cuff deflated of air and inflated with saline Airway suctioned Electronic Signature(s) Signed: 04/16/2016 10:06:37 AM By: Lorine Bears RCP, RRT, CHT Entered By: Lorine Bears on 04/16/2016 08:09:13

## 2016-04-18 NOTE — Progress Notes (Signed)
ITALO, BANTON ([id number]) Visit Report for 04/16/2016 Arrival Information Details Patient Name: [name], [name] Date of Service: 04/16/2016 8:00 AM Medical Record Number: [id number] Patient Account Number: [id number] Date of Birth/Sex: [birth year] (61 y.o. Male) Treating RN: Primary Care Samentha Perham: Annye Asa Other Clinician: Jacqulyn Bath Referring Sorrel Cassetta: Annye Asa Treating Unique Searfoss/Extender: Frann Rider in Treatment: 6 Visit Information History Since Last Visit Added or deleted any medications: No Patient Arrived: Ambulatory Any new allergies or adverse reactions: No Arrival Time: 07:50 Had a fall or experienced change in No Accompanied By: self activities of daily living that may affect Transfer Assistance: None risk of falls: Patient Identification Verified: Yes Signs or symptoms of abuse/neglect since last No Secondary Verification Process Yes visito Completed: Hospitalized since last visit: No Patient Requires Transmission-Based No Pain Present Now: No Precautions: Patient Has Alerts: No Electronic Signature(s) Signed: 04/16/2016 10:06:37 AM By: Lorine Bears RCP, RRT, CHT Entered By: Becky Sax, Amado Nash on 04/16/2016 08:06:49 Cookson, Emmitt ([id number]) -------------------------------------------------------------------------------- Encounter Discharge Information Details Patient Name: [name] Date of Service: 04/16/2016 8:00 AM Medical Record Number: [id number] Patient Account Number: [id number] Date of Birth/Sex: [birth year] (61 y.o. Male) Treating RN: Primary Care Greer Wainright: Annye Asa Other Clinician: Jacqulyn Bath Referring Shalea Tomczak: Annye Asa Treating Iasia Forcier/Extender: Frann Rider in Treatment: 6 Encounter Discharge Information Items Discharge Pain Level: 0 Discharge Condition: Stable Ambulatory Status: Ambulatory Discharge Destination:  Home Private Transportation: Auto Accompanied By: self Schedule Follow-up Appointment: No Medication Reconciliation completed and No provided to Patient/Care Bethenny Losee: Clinical Summary of Care: Notes Patient has an HBO treatment scheduled on 04/19/16 at 08:00 am. Electronic Signature(s) Signed: 04/16/2016 10:06:37 AM By: Lorine Bears RCP, RRT, CHT Entered By: Lorine Bears on 04/16/2016 10:06:16 [place], [place] ([id number]) -------------------------------------------------------------------------------- Vitals Details Patient Name: [name] Date of Service: 04/16/2016 8:00 AM Medical Record Number: [id number] Patient Account Number: [id number] Date of Birth/Sex: 01-09-1955 (61 y.o. Male) Treating RN: Primary Care Abshir Paolini: Annye Asa Other Clinician: Jacqulyn Bath Referring Lizzette Carbonell: Annye Asa Treating Dartha Rozzell/Extender: Frann Rider in Treatment: 6 Vital Signs Time Taken: 07:50 Temperature (F): 98.3 Height (in): 65 Pulse (bpm): 72 Weight (lbs): 168 Respiratory Rate (breaths/min): 16 Body Mass Index (BMI): 28 Blood Pressure (mmHg): 132/86 Reference Range: 80 - 120 mg / dl Electronic Signature(s) Signed: 04/16/2016 10:06:37 AM By: Lorine Bears RCP, RRT, CHT Entered By: Becky Sax, Amado Nash on 04/16/2016 08:07:15

## 2016-04-19 ENCOUNTER — Encounter: Payer: Federal, State, Local not specified - PPO | Admitting: Surgery

## 2016-04-19 DIAGNOSIS — L598 Other specified disorders of the skin and subcutaneous tissue related to radiation: Secondary | ICD-10-CM | POA: Diagnosis not present

## 2016-04-19 DIAGNOSIS — Z8546 Personal history of malignant neoplasm of prostate: Secondary | ICD-10-CM | POA: Diagnosis not present

## 2016-04-19 DIAGNOSIS — N3289 Other specified disorders of bladder: Secondary | ICD-10-CM | POA: Diagnosis not present

## 2016-04-19 DIAGNOSIS — N3041 Irradiation cystitis with hematuria: Secondary | ICD-10-CM | POA: Diagnosis not present

## 2016-04-19 NOTE — Progress Notes (Signed)
MAX, NUNO ([id number]) Visit Report for 04/19/2016 HBO Details Patient Name: [name], [name] Date of Service: 04/19/2016 8:00 AM Medical Record Number: [id number] Patient Account Number: [id number] Date of Birth/Sex: Nov 04, 1955 (61 y.o. 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 HBO Treatment Details Treatment Number: 33 Patient Type: Outpatient Chamber Type: Monoplace Chamber Serial #: [id number] Treatment Protocol: 2.0 ATA with 90 minutes oxygen, and no air breaks Treatment Details Compression Rate Down: 1.5 psi / minute De-Compression Rate Up: 1.5 psi / minute Air breaks and breathing Compress Tx Pressure periods Decompress Decompress Begins Reached (leave unused spaces Begins Ends blank) Chamber Pressure (ATA) 1 2 - - - - - - 2 1 Clock Time (24 hr) 08:09 08:21 - - - - - - 09:51 10:01 Treatment Length: 112 (minutes) Treatment Segments: 4 Capillary Blood Glucose Pre Capillary Blood Glucose (mg/dl): Post Capillary Blood Glucose (mg/dl): Vital Signs Capillary Blood Glucose Reference Range: 80 - 120 mg / dl HBO Diabetic Blood Glucose Intervention Range: <131 mg/dl or >249 mg/dl Time Vitals Blood Respiratory Capillary Blood Glucose Pulse Action Type: Pulse: Temperature: Taken: Pressure: Rate: Glucose (mg/dl): Meter #: Oximetry (%) Taken: Pre 07:55 138/94 66 16 98.3 Post 10:03 136/98 66 16 98.5 Treatment Response Treatment Completion Status: Treatment Completed without Adverse Event [name], [name] ([id number]) HBO Attestation I certify that I supervised this HBO treatment in accordance with Medicare  guidelines. A trained Yes emergency response team is readily available per hospital policies and procedures. Continue HBOT as ordered. Yes Electronic Signature(s) Signed: 04/19/2016 10:32:10 AM By: Christin Fudge MD, FACS Previous Signature: 04/19/2016 10:31:27 AM Version By: Lorine Bears RCP, RRT, CHT Entered By: Christin Fudge on 04/19/2016 10:32:10 [name], [name] ([id number]) -------------------------------------------------------------------------------- HBO Safety Checklist Details Patient Name: [name] Date of Service: 04/19/2016 8:00 AM Medical Record Number: [id number] Patient Account Number: [id number] Date of Birth/Sex: 1955/07/12 (61 y.o. Male) Treating RN: Primary Care Jamilett Ferrante: Annye Asa Other Clinician: Jacqulyn Bath Referring Aydyn Testerman: Annye Asa Treating Llana Deshazo/Extender: Frann Rider in Treatment: 6 HBO Safety Checklist Items Safety Checklist Consent Form Signed Patient voided / foley secured and emptied When did you last eato 04/18/16 pm Last dose of injectable or oral agent n/a NA Ostomy pouch emptied and vented if applicable NA All implantable devices assessed, documented and approved NA Intravenous access site secured and place Valuables secured Linens and cotton and cotton/polyester blend (less than 51% polyester) Personal oil-based products / skin lotions / body lotions removed NA Wigs or hairpieces removed NA Smoking or tobacco materials removed Books / newspapers / magazines / loose paper removed Cologne, aftershave, perfume and deodorant removed Jewelry removed (may wrap wedding band) NA Make-up removed Hair care products removed Battery operated devices (external) removed Heating patches and chemical warmers removed NA Titanium eyewear removed NA Nail polish cured greater than 10 hours NA Casting material cured greater than 10 hours NA Hearing aids removed Loose dentures or partials removed NA  Prosthetics have been removed Patient demonstrates correct use of air break device (if applicable) Patient concerns have been addressed Patient grounding bracelet on and cord attached to chamber Specifics for Inpatients (complete in addition to above) Medication sheet sent with patient Intravenous medications needed or due during therapy sent with patient [name], [name] ([id number]) Drainage  tubes (e.g. nasogastric tube or chest tube secured and vented) Endotracheal or Tracheotomy tube secured Cuff deflated of air and inflated with saline Airway suctioned Electronic Signature(s) Signed: 04/19/2016 10:31:27 AM By: Lorine Bears RCP, RRT, CHT Entered By: Becky Sax, Amado Nash on 04/19/2016 08:17:21

## 2016-04-19 NOTE — Progress Notes (Signed)
[name], [name] ([id number]) Visit Report for 04/19/2016 Arrival Information Details Patient Name: [name], [name] Date of Service: 04/19/2016 8:00 AM Medical Record Number: [id number] Patient Account Number: [id number] Date of Birth/Sex: 24-Dec-1955 (61 y.o. Male) Treating RN: Primary Care Wrenna Saks: Annye Asa Other Clinician: Jacqulyn Bath Referring Sravya Grissom: Annye Asa Treating Montavious Wierzba/Extender: Frann Rider in Treatment: 6 Visit Information History Since Last Visit Added or deleted any medications: No Patient Arrived: Ambulatory Any new allergies or adverse reactions: No Arrival Time: 07:55 Had a fall or experienced change in No Accompanied By: self activities of daily living that may affect Transfer Assistance: None risk of falls: Patient Identification Verified: Yes Hospitalized since last visit: No Secondary Verification Process Yes Pain Present Now: No Completed: Patient Requires Transmission-Based No Precautions: Patient Has Alerts: No Electronic Signature(s) Signed: 04/19/2016 10:31:27 AM By: Lorine Bears RCP, RRT, CHT Entered By: Lorine Bears on 04/19/2016 08:15:55 [name] ([id number]) -------------------------------------------------------------------------------- Encounter Discharge Information Details Patient Name: [name] Date of Service: 04/19/2016 8:00 AM Medical Record Number: [id number] Patient Account Number: [id number] Date of Birth/Sex: 1955-02-25 (61 y.o. Male) Treating RN: Primary Care Gentry Seeber: Annye Asa Other Clinician: Jacqulyn Bath Referring Nakayla Rorabaugh: Annye Asa Treating Hermen Mario/Extender: Frann Rider in Treatment: 6 Encounter Discharge Information Items Discharge Pain Level: 0 Discharge Condition: Stable Ambulatory Status: Ambulatory Discharge Destination: Home Private Transportation: Auto Accompanied By: self Schedule Follow-up Appointment:  No Medication Reconciliation completed and No provided to Patient/Care Elisea Khader: Clinical Summary of Care: Notes Patient has an HBO treatment scheduled on 04/20/16 at 08:00 am. Electronic Signature(s) Signed: 04/19/2016 10:31:27 AM By: Lorine Bears RCP, RRT, CHT Entered By: Lorine Bears on 04/19/2016 10:31:00 [name] ([id number]) -------------------------------------------------------------------------------- Vitals Details Patient Name: [name] Date of Service: 04/19/2016 8:00 AM Medical Record Number: [id number] Patient Account Number: [id number] Date of Birth/Sex: 07/01/1955 (61 y.o. Male) Treating RN: Primary Care Kaylaann Mountz: Annye Asa Other Clinician: Jacqulyn Bath Referring Jamerion Cabello: Annye Asa Treating Greysen Swanton/Extender: Frann Rider in Treatment: 6 Vital Signs Time Taken: 07:55 Temperature (F): 98.3 Height (in): 65 Pulse (bpm): 66 Weight (lbs): 168 Respiratory Rate (breaths/min): 16 Body Mass Index (BMI): 28 Blood Pressure (mmHg): 138/94 Reference Range: 80 - 120 mg / dl Electronic Signature(s) Signed: 04/19/2016 10:31:27 AM By: Lorine Bears RCP, RRT, CHT Entered By: [name] on 04/19/2016 49:82:64

## 2016-04-20 ENCOUNTER — Encounter: Payer: Federal, State, Local not specified - PPO | Admitting: Internal Medicine

## 2016-04-20 DIAGNOSIS — L598 Other specified disorders of the skin and subcutaneous tissue related to radiation: Secondary | ICD-10-CM | POA: Diagnosis not present

## 2016-04-20 DIAGNOSIS — N3041 Irradiation cystitis with hematuria: Secondary | ICD-10-CM | POA: Diagnosis not present

## 2016-04-20 DIAGNOSIS — N3289 Other specified disorders of bladder: Secondary | ICD-10-CM | POA: Diagnosis not present

## 2016-04-20 DIAGNOSIS — Z8546 Personal history of malignant neoplasm of prostate: Secondary | ICD-10-CM | POA: Diagnosis not present

## 2016-04-20 NOTE — Progress Notes (Signed)
[name], [name] ([id number]) Visit Report for 04/20/2016 Arrival Information Details Patient Name: [name], [name] Date of Service: 04/20/2016 8:00 AM Medical Record Number: [id number] Patient Account Number: [id number] Date of Birth/Sex: [birth year] (61 y.o. Male) Treating RN: Primary Care Azeez Dunker: Annye Asa Other Clinician: Jacqulyn Bath Referring Abishai Viegas: Annye Asa Treating Freddye Cardamone/Extender: Tito Dine in Treatment: 6 Visit Information History Since Last Visit Added or deleted any medications: No Patient Arrived: Ambulatory Any new allergies or adverse reactions: No Arrival Time: 07:55 Had a fall or experienced change in No Accompanied By: self activities of daily living that may affect Transfer Assistance: None risk of falls: Patient Identification Verified: Yes Hospitalized since last visit: No Secondary Verification Process Yes Pain Present Now: No Completed: Patient Requires Transmission-Based No Precautions: Patient Has Alerts: No Electronic Signature(s) Signed: 04/20/2016 10:38:05 AM By: Lorine Bears RCP, RRT, CHT Entered By: Lorine Bears on 04/20/2016 08:02:10 [place], Farid ([id number]) -------------------------------------------------------------------------------- Encounter Discharge Information Details Patient Name: [name] Date of Service: 04/20/2016 8:00 AM Medical Record Number: [id number] Patient Account Number: [id number] Date of Birth/Sex: 07-05-1955 (61 y.o. Male) Treating RN: Primary Care Lason Eveland: Annye Asa Other Clinician: Jacqulyn Bath Referring Nychelle Cassata: Annye Asa Treating Koleson Reifsteck/Extender: Tito Dine in Treatment: 6 Encounter Discharge Information Items Discharge Pain Level: 0 Discharge Condition: Stable Ambulatory Status: Ambulatory Discharge Destination: Home Private Transportation: Auto Accompanied By: self Schedule Follow-up  Appointment: No Medication Reconciliation completed and No provided to Patient/Care Kiyoko Mcguirt: Clinical Summary of Care: Notes Patient has an HBO treatment scheduled on 04/21/16 at 08:00 am. Electronic Signature(s) Signed: 04/20/2016 10:38:05 AM By: Lorine Bears RCP, RRT, CHT Entered By: Lorine Bears on 04/20/2016 10:37:16 [name], [name] ([id number]) -------------------------------------------------------------------------------- Vitals Details Patient Name: [name] Date of Service: 04/20/2016 8:00 AM Medical Record Number: [id number] Patient Account Number: [id number] Date of Birth/Sex: March 18, 1955 (61 y.o. Male) Treating RN: Primary Care Avereigh Spainhower: Annye Asa Other Clinician: Jacqulyn Bath Referring Jasai Sorg: Annye Asa Treating Tonya Wantz/Extender: Ricard Dillon Weeks in Treatment: 6 Vital Signs Time Taken: 07:58 Temperature (F): 98.2 Height (in): 65 Pulse (bpm): 72 Weight (lbs): 168 Respiratory Rate (breaths/min): 16 Body Mass Index (BMI): 28 Blood Pressure (mmHg): 132/100 Reference Range: 80 - 120 mg / dl Electronic Signature(s) Signed: 04/20/2016 10:38:05 AM By: Lorine Bears RCP, RRT, CHT Entered By: Becky Sax, Amado Nash on 04/20/2016 08:02:32

## 2016-04-21 ENCOUNTER — Encounter: Payer: Federal, State, Local not specified - PPO | Admitting: Internal Medicine

## 2016-04-21 DIAGNOSIS — L598 Other specified disorders of the skin and subcutaneous tissue related to radiation: Secondary | ICD-10-CM | POA: Diagnosis not present

## 2016-04-21 DIAGNOSIS — N3289 Other specified disorders of bladder: Secondary | ICD-10-CM | POA: Diagnosis not present

## 2016-04-21 DIAGNOSIS — N3041 Irradiation cystitis with hematuria: Secondary | ICD-10-CM | POA: Diagnosis not present

## 2016-04-21 DIAGNOSIS — Z8546 Personal history of malignant neoplasm of prostate: Secondary | ICD-10-CM | POA: Diagnosis not present

## 2016-04-22 ENCOUNTER — Encounter: Payer: Federal, State, Local not specified - PPO | Admitting: Surgery

## 2016-04-22 DIAGNOSIS — N3041 Irradiation cystitis with hematuria: Secondary | ICD-10-CM | POA: Diagnosis not present

## 2016-04-22 DIAGNOSIS — Z8546 Personal history of malignant neoplasm of prostate: Secondary | ICD-10-CM | POA: Diagnosis not present

## 2016-04-22 DIAGNOSIS — L598 Other specified disorders of the skin and subcutaneous tissue related to radiation: Secondary | ICD-10-CM | POA: Diagnosis not present

## 2016-04-22 DIAGNOSIS — N3289 Other specified disorders of bladder: Secondary | ICD-10-CM | POA: Diagnosis not present

## 2016-04-22 NOTE — Progress Notes (Signed)
EITHEN, CASTIGLIA ([id number]) Visit Report for 04/20/2016 HBO Details Patient Name: [name], [name] Date of Service: 04/20/2016 8:00 AM Medical Record Number: [id number] Patient Account Number: [id number] Date of Birth/Sex: [birth year] (61 y.o. 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 HBO Treatment Details Treatment Number: 34 Patient Type: Outpatient Chamber Type: Monoplace Chamber Serial #: [id number] Treatment Protocol: 2.0 ATA with 90 minutes oxygen, and no air breaks Treatment Details Compression Rate Down: 1.5 psi / minute De-Compression Rate Up: 1.5 psi / minute Air breaks and breathing Compress Tx Pressure periods Decompress Decompress Begins Reached (leave unused spaces Begins Ends blank) Chamber Pressure (ATA) 1 2 - - - - - - 2 1 Clock Time (24 hr) 08:08 08:20 - - - - - - 09:50 10:00 Treatment Length: 112 (minutes) Treatment Segments: 4 Capillary Blood Glucose Pre Capillary Blood Glucose (mg/dl): Post Capillary Blood Glucose (mg/dl): Vital Signs Capillary Blood Glucose Reference Range: 80 - 120 mg / dl HBO Diabetic Blood Glucose Intervention Range: <131 mg/dl or >249 mg/dl Time Vitals Blood Respiratory Capillary Blood Glucose Pulse Action Type: Pulse: Temperature: Taken: Pressure: Rate: Glucose (mg/dl): Meter #: Oximetry (%) Taken: Pre 07:58 132/100 72 16 98.2 Post 10:03 142/100 66 16 98.3 Treatment Response Treatment Completion Status: Treatment Completed without Adverse Event [name], [name] ([id number]) Ivie Savitt Notes No concerns with treatment given HBO Attestation I certify that I supervised  this HBO treatment in accordance with Medicare guidelines. A trained Yes emergency response team is readily available per hospital policies and procedures. Continue HBOT as ordered. Yes Electronic Signature(s) Signed: 04/21/2016 7:57:33 AM By: Linton Ham MD Previous Signature: 04/20/2016 10:38:05 AM Version By: Lorine Bears RCP, RRT, CHT Entered By: Linton Ham on 04/20/2016 17:15:00 [name], [name] ([id number]) -------------------------------------------------------------------------------- HBO Safety Checklist Details Patient Name: [name] Date of Service: 04/20/2016 8:00 AM Medical Record Number: [id number] Patient Account Number: [id number] Date of Birth/Sex: Feb 19, 1955 (61 y.o. Male) Treating RN: Primary Care Valbona Slabach: Annye Asa Other Clinician: Jacqulyn Bath Referring Najwa Spillane: Annye Asa Treating Christophor Eick/Extender: Tito Dine in Treatment: 6 HBO Safety Checklist Items Safety Checklist Consent Form Signed Patient voided / foley secured and emptied When did you last eato 04/20/16 am Last dose of injectable or oral agent n/a NA Ostomy pouch emptied and vented if applicable NA All implantable devices assessed, documented and approved NA Intravenous access site secured and place Valuables secured Linens and cotton and cotton/polyester blend (less than 51% polyester) Personal oil-based products / skin lotions / body lotions removed NA Wigs or hairpieces removed NA Smoking or tobacco materials removed Books / newspapers / magazines / loose paper removed Cologne, aftershave, perfume and deodorant removed Jewelry removed (may wrap wedding band) NA Make-up removed Hair care products removed Battery operated devices (external) removed Heating patches and chemical warmers removed NA Titanium eyewear removed NA Nail polish cured greater than 10 hours NA Casting material cured greater than 10 hours NA Hearing aids  removed Loose dentures or partials removed NA Prosthetics have been removed Patient demonstrates correct use of air break device (if applicable) Patient concerns have been addressed Patient grounding bracelet on and cord attached to chamber Specifics for Inpatients (complete in addition to above) Medication sheet sent with patient Intravenous medications needed or due during  therapy sent with patient [name], [name] ([id number]) Drainage tubes (e.g. nasogastric tube or chest tube secured and vented) Endotracheal or Tracheotomy tube secured Cuff deflated of air and inflated with saline Airway suctioned Electronic Signature(s) Signed: 04/20/2016 10:38:05 AM By: Lorine Bears RCP, RRT, CHT Entered By: Lorine Bears on 04/20/2016 08:03:31

## 2016-04-22 NOTE — Progress Notes (Signed)
CHRISTPHOR, GROFT ([id number]) Visit Report for 04/21/2016 Arrival Information Details Patient Name: THAINE, GARRIGA Date of Service: 04/21/2016 8:00 AM Medical Record Number: [id number] Patient Account Number: [id number] Date of Birth/Sex: 06-01-1955 (61 y.o. Male) Treating RN: Primary Care [place]ale Youngblood: Annye Asa Other Clinician: Jacqulyn Bath Referring Zawadi Aplin: Annye Asa Treating Dossie Ocanas/Extender: Tito Dine in Treatment: 7 Visit Information History Since Last Visit Added or deleted any medications: No Patient Arrived: Ambulatory Any new allergies or adverse reactions: No Arrival Time: 07:50 Had a fall or experienced change in No Accompanied By: self activities of daily living that may affect Transfer Assistance: None risk of falls: Patient Identification Verified: Yes Signs or symptoms of abuse/neglect since last No Secondary Verification Process Yes visito Completed: Hospitalized since last visit: No Patient Requires Transmission-Based No Pain Present Now: No Precautions: Patient Has Alerts: No Electronic Signature(s) Signed: 04/21/2016 10:31:09 AM By: Lorine Bears RCP, RRT, CHT Entered By: Lorine Bears on 04/21/2016 08:11:52 [place], Pasha ([id number]) -------------------------------------------------------------------------------- Encounter Discharge Information Details Patient Name: [name] Date of Service: 04/21/2016 8:00 AM Medical Record Number: [id number] Patient Account Number: [id number] Date of Birth/Sex: 1955-07-27 (61 y.o. Male) Treating RN: Primary Care Denaja Verhoeven: Annye Asa Other Clinician: Jacqulyn Bath Referring Bevelyn Arriola: Annye Asa Treating Angell Honse/Extender: Tito Dine in Treatment: 7 Encounter Discharge Information Items Discharge Pain Level: 0 Discharge Condition: Stable Ambulatory Status: Ambulatory Discharge Destination:  Home Private Transportation: Auto Accompanied By: self Schedule Follow-up Appointment: No Medication Reconciliation completed and No provided to Patient/Care Jelisa Newcastle: Clinical Summary of Care: Notes Patient has an HBO treatment scheduled on 04/22/16 at 08:00 am. Electronic Signature(s) Signed: 04/21/2016 10:31:09 AM By: Lorine Bears RCP, RRT, CHT Entered By: Lorine Bears on 04/21/2016 10:30:44 [place], [place] ([id number]) -------------------------------------------------------------------------------- Vitals Details Patient Name: [name] Date of Service: 04/21/2016 8:00 AM Medical Record Number: [id number] Patient Account Number: [id number] Date of Birth/Sex: [birth year] (61 y.o. Male) Treating RN: Primary Care Lizzie Cokley: Annye Asa Other Clinician: Jacqulyn Bath Referring Jyrah Blye: Annye Asa Treating Itzia Cunliffe/Extender: Ricard Dillon Weeks in Treatment: 7 Vital Signs Time Taken: 08:00 Temperature (F): 98.9 Height (in): 65 Pulse (bpm): 66 Weight (lbs): 168 Respiratory Rate (breaths/min): 16 Body Mass Index (BMI): 28 Blood Pressure (mmHg): 138/78 Reference Range: 80 - 120 mg / dl Electronic Signature(s) Signed: 04/21/2016 10:31:09 AM By: Lorine Bears RCP, RRT, CHT Entered By: Lorine Bears on 04/21/2016 08:13:34

## 2016-04-22 NOTE — Progress Notes (Signed)
PHEONIX, CLINKSCALE ([id number]) Visit Report for 04/21/2016 HBO Details Patient Name: [name], [name] Date of Service: 04/21/2016 8:00 AM Medical Record Number: [id number] Patient Account Number: [id number] Date of Birth/Sex: 1955-12-25 (61 y.o. 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 HBO Treatment Details Treatment Number: 35 Patient Type: Outpatient Chamber Type: Monoplace Chamber Serial #: [id number] Treatment Protocol: 2.0 ATA with 90 minutes oxygen, and no air breaks Treatment Details Compression Rate Down: 1.5 psi / minute De-Compression Rate Up: 1.5 psi / minute Air breaks and breathing Compress Tx Pressure periods Decompress Decompress Begins Reached (leave unused spaces Begins Ends blank) Chamber Pressure (ATA) 1 2 - - - - - - 2 1 Clock Time (24 hr) 08:06 08:18 - - - - - - 09:49 09:59 Treatment Length: 113 (minutes) Treatment Segments: 4 Capillary Blood Glucose Pre Capillary Blood Glucose (mg/dl): Post Capillary Blood Glucose (mg/dl): Vital Signs Capillary Blood Glucose Reference Range: 80 - 120 mg / dl HBO Diabetic Blood Glucose Intervention Range: <131 mg/dl or >249 mg/dl Time Vitals Blood Respiratory Capillary Blood Glucose Pulse Action Type: Pulse: Temperature: Taken: Pressure: Rate: Glucose (mg/dl): Meter #: Oximetry (%) Taken: Pre 08:00 138/78 66 16 98.9 Post 10:03 148/100 66 16 98.5 Treatment Response Treatment Completion Status: Treatment Completed without Adverse Event Rhody, Domonic ([id number]) Electronic Signature(s) Signed: 04/21/2016 10:31:09 AM By: Lorine Bears RCP,  RRT, CHT Signed: 04/21/2016 5:12:22 PM By: Linton Ham MD Entered By: Lorine Bears on 04/21/2016 10:28:57 [place], Dareion ([id number]) -------------------------------------------------------------------------------- HBO Safety Checklist Details Patient Name: [name] Date of Service: 04/21/2016 8:00 AM Medical Record Number: [id number] Patient Account Number: [id number] Date of Birth/Sex: 04/16/1955 (60 y.o. Male) Treating RN: Primary Care Kyden Potash: Annye Asa Other Clinician: Jacqulyn Bath Referring Chanceler Pullin: Annye Asa Treating Yaacov Koziol/Extender: Ricard Dillon Weeks in Treatment: 7 HBO Safety Checklist Items Safety Checklist Consent Form Signed Patient voided / foley secured and emptied When did you last eato 04/21/16 am Last dose of injectable or oral agent n/a NA Ostomy pouch emptied and vented if applicable NA All implantable devices assessed, documented and approved NA Intravenous access site secured and place Valuables secured Linens and cotton and cotton/polyester blend (less than 51% polyester) NA Personal oil-based products / skin lotions / body lotions removed NA Wigs or hairpieces removed NA Smoking or tobacco materials removed Books / newspapers / magazines / loose paper removed Cologne, aftershave, perfume and deodorant removed Jewelry removed (may wrap wedding band) NA Make-up removed Hair care products removed Battery operated devices (external) removed Heating patches and chemical warmers removed NA Titanium eyewear removed NA Nail polish cured greater than 10 hours NA Casting material cured greater than 10 hours NA Hearing aids removed Loose dentures or partials removed NA Prosthetics have been removed Patient demonstrates correct use of air break device (if applicable) Patient concerns have been addressed Patient grounding bracelet on and cord attached to chamber Specifics for Inpatients (complete in addition  to above) Medication sheet sent with patient Intravenous medications needed or due during therapy sent with patient DOMBEK, Dirck ([id number]) Drainage tubes (e.g. nasogastric tube or chest tube secured and vented) Endotracheal or Tracheotomy tube secured Cuff deflated of air and inflated with saline Airway suctioned Electronic Signature(s) Signed: 04/21/2016 10:31:09 AM By: Juleen China,  RCP,RRT,CHT, Sallie RCP, RRT, CHT Entered By: Lorine Bears on 04/21/2016 08:14:45

## 2016-04-23 ENCOUNTER — Encounter: Payer: Federal, State, Local not specified - PPO | Admitting: Nurse Practitioner

## 2016-04-23 DIAGNOSIS — N3289 Other specified disorders of bladder: Secondary | ICD-10-CM | POA: Diagnosis not present

## 2016-04-23 DIAGNOSIS — Z8546 Personal history of malignant neoplasm of prostate: Secondary | ICD-10-CM | POA: Diagnosis not present

## 2016-04-23 DIAGNOSIS — N3041 Irradiation cystitis with hematuria: Secondary | ICD-10-CM | POA: Diagnosis not present

## 2016-04-23 DIAGNOSIS — L598 Other specified disorders of the skin and subcutaneous tissue related to radiation: Secondary | ICD-10-CM | POA: Diagnosis not present

## 2016-04-23 NOTE — Progress Notes (Signed)
[place], SCHROADER ([id number]) Visit Report for 04/22/2016 Arrival Information Details Patient Name: [name], [name] Date of Service: 04/22/2016 8:00 AM Medical Record Number: [id number] Patient Account Number: [id number] Date of Birth/Sex: [birth year] (61 y.o. Male) Treating RN: Primary Care [name]: [name] Other Clinician: Jacqulyn [name] Referring [name]: [name] Treating Augustina [name]/Extender: [name] in Treatment: 7 Visit Information History Since Last Visit Added or deleted any medications: No Patient Arrived: Ambulatory Any new allergies or adverse reactions: No Arrival Time: 07:50 Had a fall or experienced change in No Accompanied By: self activities of daily living that may affect Transfer Assistance: None risk of falls: Patient Identification Verified: Yes Signs or symptoms of abuse/neglect since last No Secondary Verification Process Yes visito Completed: Hospitalized since last visit: No Patient Requires Transmission-Based No Pain Present Now: No Precautions: Patient Has Alerts: No Electronic Signature(s) Signed: 04/22/2016 10:28:15 AM By: [name] RCP, RRT, CHT Entered By: [name] on 04/22/2016 08:04:47 [name], [name] ([id number]) -------------------------------------------------------------------------------- Encounter Discharge Information Details Patient Name: [name] Date of Service: 04/22/2016 8:00 AM Medical Record Number: [id number] Patient Account Number: [id number] Date of Birth/Sex: [birth year] (61 y.o. Male) Treating RN: Primary Care [name]: [name] Other Clinician: Jacqulyn [name] Referring [name]: [name] Treating [name]/Extender: [name] in Treatment: 7 Encounter Discharge Information Items Discharge Pain Level: 0 Discharge Condition: Stable Ambulatory Status: Ambulatory Discharge Destination:  Home Private Transportation: Auto Accompanied By: self Schedule Follow-up Appointment: No Medication Reconciliation completed and No provided to Patient/Care [name]: Clinical Summary of Care: Notes Patient has an HBO treatment scheduled on 04/23/16 at 08:00 am. Electronic Signature(s) Signed: 04/22/2016 10:28:15 AM By: [name] RCP, RRT, CHT Entered By: [name] on 04/22/2016 10:27:52 [name] ([id number]) -------------------------------------------------------------------------------- Vitals Details Patient Name: [name] Date of Service: 04/22/2016 8:00 AM Medical Record Number: [id number] Patient Account Number: [id number] Date of Birth/Sex: 01/01/1956 (61 y.o. Male) Treating RN: Primary Care [name]: [name] Other Clinician: Jacqulyn [name] Referring Jia Dottavio: [name] Treating [name]/Extender: [name] in Treatment: 7 Vital Signs Time Taken: 07:55 Temperature (F): 98.1 Height (in): 65 Pulse (bpm): 72 Weight (lbs): 168 Respiratory Rate (breaths/min): 16 Body Mass Index (BMI): 28 Blood Pressure (mmHg): 142/76 Reference Range: 80 - 120 mg / dl Electronic Signature(s) Signed: 04/22/2016 10:28:15 AM By: [name] RCP, RRT, CHT Entered By: [name] on 04/22/2016 08:05:27

## 2016-04-23 NOTE — Progress Notes (Signed)
LLEWELYN, SHEAFFER ([id number]) Visit Report for 04/22/2016 HBO Details Patient Name: [name], [name] Date of Service: 04/22/2016 8:00 AM Medical Record Number: [id number] Patient Account Number: [id number] Date of Birth/Sex: 11-14-1955 (61 y.o. 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 HBO Treatment Details Treatment Number: 36 Patient Type: Outpatient Chamber Type: Monoplace Chamber Serial #: [id number] Treatment Protocol: 2.0 ATA with 90 minutes oxygen, and no air breaks Treatment Details Compression Rate Down: 1.5 psi / minute De-Compression Rate Up: 1.5 psi / minute Air breaks and breathing Compress Tx Pressure periods Decompress Decompress Begins Reached (leave unused spaces Begins Ends blank) Chamber Pressure (ATA) 1 2 - - - - - - 2 1 Clock Time (24 hr) 08:04 08:16 - - - - - - 09:46 09:56 Treatment Length: 112 (minutes) Treatment Segments: 4 Capillary Blood Glucose Pre Capillary Blood Glucose (mg/dl): Post Capillary Blood Glucose (mg/dl): Vital Signs Capillary Blood Glucose Reference Range: 80 - 120 mg / dl HBO Diabetic Blood Glucose Intervention Range: <131 mg/dl or >249 mg/dl Time Vitals Blood Respiratory Capillary Blood Glucose Pulse Action Type: Pulse: Temperature: Taken: Pressure: Rate: Glucose (mg/dl): Meter #: Oximetry (%) Taken: Pre 07:55 142/76 72 16 98.1 Post 10:00 132/96 66 16 98.5 Treatment Response Treatment Completion Status: Treatment Completed without Adverse Event [name], [name] ([id number]) Electronic Signature(s) Signed: 04/22/2016 10:28:15 AM By: Lorine Bears RCP, RRT,  CHT Signed: 04/22/2016 4:16:12 PM By: Christin Fudge MD, FACS Previous Signature: 04/22/2016 10:00:53 AM Version By: Christin Fudge MD, FACS Previous Signature: 04/22/2016 8:08:40 AM Version By: Christin Fudge MD, FACS Entered By: Lorine Bears on 04/22/2016 10:09:39 [name], [name] ([id number]) -------------------------------------------------------------------------------- HBO Safety Checklist Details Patient Name: [name] Date of Service: 04/22/2016 8:00 AM Medical Record Number: [id number] Patient Account Number: [id number] Date of Birth/Sex: 21-Dec-1955 (61 y.o. Male) Treating RN: Primary Care Mariella Blackwelder: Annye Asa Other Clinician: Jacqulyn Bath Referring Gleen Ripberger: Annye Asa Treating Yoana Staib/Extender: Frann Rider in Treatment: 7 HBO Safety Checklist Items Safety Checklist Consent Form Signed Patient voided / foley secured and emptied When did you last eato 04/22/16 am Last dose of injectable or oral agent n/a NA Ostomy pouch emptied and vented if applicable NA All implantable devices assessed, documented and approved NA Intravenous access site secured and place Valuables secured Linens and cotton and cotton/polyester blend (less than 51% polyester) Personal oil-based products / skin lotions / body lotions removed NA Wigs or hairpieces removed NA Smoking or tobacco materials removed Books / newspapers / magazines / loose paper removed Cologne, aftershave, perfume and deodorant removed Jewelry removed (may wrap wedding band) NA Make-up removed Hair care products removed Battery operated devices (external) removed Heating patches and chemical warmers removed NA Titanium eyewear removed NA Nail polish cured greater than 10 hours NA Casting material cured greater than 10 hours NA Hearing aids removed Loose dentures or partials removed NA Prosthetics have been removed Patient demonstrates correct use of air break device (if  applicable) Patient concerns have been addressed Patient grounding bracelet on and cord attached to chamber Specifics for Inpatients (complete in addition to above) Medication sheet sent with patient Intravenous medications needed or due during therapy sent with patient [name], [name] ([id number]) Drainage tubes (e.g. nasogastric tube or chest tube secured and vented) Endotracheal or Tracheotomy  tube secured Cuff deflated of air and inflated with saline Airway suctioned Electronic Signature(s) Signed: 04/22/2016 10:28:15 AM By: Lorine Bears RCP, RRT, CHT Entered By: Becky Sax, Amado Nash on 04/22/2016 08:06:21

## 2016-04-25 NOTE — Progress Notes (Signed)
[name], [name] ([id number]) Visit Report for 04/23/2016 Arrival Information Details Patient Name: [name], [name] Date of Service: 04/23/2016 8:00 AM Medical Record Number: [id number] Patient Account Number: [id number] Date of Birth/Sex: 1955-05-09 (61 y.o. Male) Treating RN: Primary Care Anijah Spohr: Annye Asa Other Clinician: Jacqulyn Bath Referring Vian Fluegel: Annye Asa Treating Suleman Gunning/Extender: Cathie Olden in Treatment: 7 Visit Information History Since Last Visit Added or deleted any medications: No Patient Arrived: Ambulatory Any new allergies or adverse reactions: No Arrival Time: 07:54 Had a fall or experienced change in No Accompanied By: self activities of daily living that may affect Transfer Assistance: None risk of falls: Patient Identification Verified: Yes Signs or symptoms of abuse/neglect since last No Secondary Verification Process Yes visito Completed: Hospitalized since last visit: No Patient Requires Transmission-Based No Pain Present Now: No Precautions: Patient Has Alerts: No Electronic Signature(s) Signed: 04/23/2016 10:07:43 AM By: Lorine Bears RCP, RRT, CHT Entered By: Lorine Bears on 04/23/2016 07:55:10 [name], [name] ([id number]) -------------------------------------------------------------------------------- Encounter Discharge Information Details Patient Name: [name] Date of Service: 04/23/2016 8:00 AM Medical Record Number: [id number] Patient Account Number: [id number] Date of Birth/Sex: Aug 09, 1955 (61 y.o. Male) Treating RN: Primary Care Capri Veals: Annye Asa Other Clinician: Jacqulyn Bath Referring Shaton Lore: Annye Asa Treating Gabriela Irigoyen/Extender: Cathie Olden in Treatment: 7 Encounter Discharge Information Items Discharge Pain Level: 0 Discharge Condition: Stable Ambulatory Status: Ambulatory Discharge Destination:  Home Private Transportation: Auto Accompanied By: self Schedule Follow-up Appointment: No Medication Reconciliation completed and No provided to Patient/Care Mordechai Matuszak: Clinical Summary of Care: Notes Patient has an HBO treatment scheduled on 04/26/16 at 08:00 am. Electronic Signature(s) Signed: 04/23/2016 10:07:43 AM By: Lorine Bears RCP, RRT, CHT Entered By: Lorine Bears on 04/23/2016 10:07:20 [name], [name] ([id number]) -------------------------------------------------------------------------------- Vitals Details Patient Name: [name] Date of Service: 04/23/2016 8:00 AM Medical Record Number: [id number] Patient Account Number: [id number] Date of Birth/Sex: August 13, 1955 (60 y.o. Male) Treating RN: Primary Care Deshawn Witty: Annye Asa Other Clinician: Jacqulyn Bath Referring Yanil Dawe: Annye Asa Treating Axxel Gude/Extender: Cathie Olden in Treatment: 7 Vital Signs Time Taken: 08:00 Temperature (F): 98.1 Height (in): 65 Pulse (bpm): 66 Weight (lbs): 168 Respiratory Rate (breaths/min): 16 Body Mass Index (BMI): 28 Blood Pressure (mmHg): 164/82 Reference Range: 80 - 120 mg / dl Electronic Signature(s) Signed: 04/23/2016 10:07:43 AM By: Lorine Bears RCP, RRT, CHT Entered By: [name], [name] on 04/23/2016 08:11:26

## 2016-04-25 NOTE — Progress Notes (Signed)
[name] ([id number]) Visit Report for 04/23/2016 HBO Details Patient Name: [name] Date of Service: 04/23/2016 8:00 AM Medical Record Number: [id number] Patient Account Number: [id number] Date of Birth/Sex: 1955/09/01 (61 y.o. 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 HBO Treatment Details Treatment Number: 37 Patient Type: Outpatient Chamber Type: Monoplace Chamber Serial #: [id number] Treatment Protocol: 2.0 ATA with 90 minutes oxygen, and no air breaks Treatment Details Compression Rate Down: 1.5 psi / minute De-Compression Rate Up: 1.5 psi / minute Air breaks and breathing Compress Tx Pressure periods Decompress Decompress Begins Reached (leave unused spaces Begins Ends blank) Chamber Pressure (ATA) 1 2 - - - - - - 2 1 Clock Time (24 hr) 08:07 08:19 - - - - - - 09:49 09:59 Treatment Length: 112 (minutes) Treatment Segments: 4 Capillary Blood Glucose Pre Capillary Blood Glucose (mg/dl): Post Capillary Blood Glucose (mg/dl): Vital Signs Capillary Blood Glucose Reference Range: 80 - 120 mg / dl HBO Diabetic Blood Glucose Intervention Range: <131 mg/dl or >249 mg/dl Time Vitals Blood Respiratory Capillary Blood Glucose Pulse Action Type: Pulse: Temperature: Taken: Pressure: Rate: Glucose (mg/dl): Meter #: Oximetry (%) Taken: Pre 08:00 164/82 66 16 98.1 Post 10:03 156/78 72 16 98.2 Pre-Treatment Ear Evaluation Left Right [name] ([id number]) Clear: Yes Clear: Yes Intact: Yes Intact: Yes Color: pearl Color: pearl PE Tubes inserted: No PE Tubes inserted: No Irrigated: No Irrigated:  No Myringotomy performed: No Myringotomy performed: No Left [name] Scale: Grade 0 Right [name] Scale: Grade 0 Treatment Response Treatment Completion Status: Treatment Completed without Adverse Event Post Treatment [name] Score Post Treatment [name] Score: Left Ear Grade 0 Post Treatment [name] Score: Right Ear Grade 0 HBO Attestation I certify that I supervised this HBO treatment in accordance with Medicare guidelines. A trained Yes emergency response team is readily available per hospital policies and procedures. Continue HBOT as ordered. Yes Electronic Signature(s) Signed: 04/23/2016 12:55:27 PM By: [name] Previous Signature: 04/23/2016 10:07:43 AM Version By: [name] RCP, RRT, CHT Entered By: [name] on 04/23/2016 12:55:27 [name] ([id number]) -------------------------------------------------------------------------------- HBO Safety Checklist Details Patient Name: [name] Date of Service: 04/23/2016 8:00 AM Medical Record Number: [id number] Patient Account Number: [id number] Date of Birth/Sex: [birth year] (61 y.o. Male) Treating RN: Primary Care [name]: [name] Other Clinician: Jacqulyn [name] Referring [name]: [name] Treating Tatayana Beshears/Extender: [name] in Treatment: 7 HBO Safety Checklist Items Safety Checklist Consent Form Signed Patient voided / foley secured and emptied When did you last eato 04/23/16 am Last dose of injectable or oral agent n/a NA Ostomy pouch emptied and vented if applicable NA All implantable devices assessed, documented and approved NA Intravenous access site secured and place Valuables secured Linens and cotton and cotton/polyester blend (less than 51% polyester) Personal oil-based products / skin lotions / body lotions removed NA Wigs or hairpieces removed NA Smoking or tobacco materials removed Books / newspapers / magazines / loose paper removed Cologne, aftershave,  perfume and deodorant removed Jewelry removed (may wrap wedding band) NA Make-up removed Hair care products removed Battery operated devices (external) removed Heating patches and chemical warmers removed NA Titanium eyewear removed NA Nail polish cured greater than 10 hours NA Casting material cured greater than 10 hours NA Hearing  aids removed Loose dentures or partials removed NA Prosthetics have been removed Patient demonstrates correct use of air break device (if applicable) Patient concerns have been addressed Patient grounding bracelet on and cord attached to chamber Specifics for Inpatients (complete in addition to above) Medication sheet sent with patient Intravenous medications needed or due during therapy sent with patient [name] ([id number]) Drainage tubes (e.g. nasogastric tube or chest tube secured and vented) Endotracheal or Tracheotomy tube secured Cuff deflated of air and inflated with saline Airway suctioned Electronic Signature(s) Signed: 04/23/2016 10:07:43 AM By: [name] RCP, RRT, CHT Entered By: [name] on 04/23/2016 08:13:03

## 2016-04-26 ENCOUNTER — Encounter: Payer: Federal, State, Local not specified - PPO | Admitting: Surgery

## 2016-04-26 DIAGNOSIS — N3041 Irradiation cystitis with hematuria: Secondary | ICD-10-CM | POA: Diagnosis not present

## 2016-04-26 DIAGNOSIS — N3289 Other specified disorders of bladder: Secondary | ICD-10-CM | POA: Diagnosis not present

## 2016-04-26 DIAGNOSIS — L598 Other specified disorders of the skin and subcutaneous tissue related to radiation: Secondary | ICD-10-CM | POA: Diagnosis not present

## 2016-04-26 DIAGNOSIS — Z8546 Personal history of malignant neoplasm of prostate: Secondary | ICD-10-CM | POA: Diagnosis not present

## 2016-04-26 NOTE — Progress Notes (Signed)
DONTAY, HARM ([id number]) Visit Report for 04/26/2016 Arrival Information Details Patient Name: FERNAND, SORBELLO Date of Service: 04/26/2016 8:00 AM Medical Record Number: [id number] Patient Account Number: [id number] Date of Birth/Sex: February 11, 1955 (61 y.o. Male) Treating RN: Primary Care Avice Funchess: Annye Asa Other Clinician: Jacqulyn Bath Referring Tameisha Covell: Annye Asa Treating Angelice Piech/Extender: Frann Rider in Treatment: 7 Visit Information History Since Last Visit Added or deleted any medications: No Patient Arrived: Ambulatory Any new allergies or adverse reactions: No Arrival Time: 07:50 Had a fall or experienced change in No Accompanied By: self activities of daily living that may affect Transfer Assistance: None risk of falls: Patient Identification Verified: Yes Signs or symptoms of abuse/neglect since last No Secondary Verification Process Yes visito Completed: Hospitalized since last visit: No Patient Requires Transmission-Based No Pain Present Now: No Precautions: Patient Has Alerts: No Electronic Signature(s) Signed: 04/26/2016 10:35:38 AM By: Lorine Bears RCP, RRT, CHT Entered By: Lorine Bears on 04/26/2016 08:17:41 Colver, Cris ([id number]) -------------------------------------------------------------------------------- Encounter Discharge Information Details Patient Name: [name] Date of Service: 04/26/2016 8:00 AM Medical Record Number: [id number] Patient Account Number: [id number] Date of Birth/Sex: 1955/05/21 (61 y.o. Male) Treating RN: Primary Care Sadaf Przybysz: Annye Asa Other Clinician: Jacqulyn Bath Referring Akul Leggette: Annye Asa Treating Aralyn Nowak/Extender: Frann Rider in Treatment: 7 Encounter Discharge Information Items Discharge Pain Level: 0 Discharge Condition: Stable Ambulatory Status: Ambulatory Discharge Destination:  Home Private Transportation: Auto Accompanied By: self Schedule Follow-up Appointment: No Medication Reconciliation completed and No provided to Patient/Care Jasmene Goswami: Clinical Summary of Care: Notes Patient has an HBO treatment scheduled on 04/27/16 at 08:00 am. Electronic Signature(s) Signed: 04/26/2016 10:35:38 AM By: Lorine Bears RCP, RRT, CHT Entered By: Lorine Bears on 04/26/2016 10:06:33 Ethel, Ibraham ([id number]) -------------------------------------------------------------------------------- Vitals Details Patient Name: [name] Date of Service: 04/26/2016 8:00 AM Medical Record Number: [id number] Patient Account Number: [id number] Date of Birth/Sex: [birth year] (61 y.o. Male) Treating RN: Primary Care Brynlee Pennywell: Annye Asa Other Clinician: Jacqulyn Bath Referring Orange Hilligoss: Annye Asa Treating Celestina Gironda/Extender: Frann Rider in Treatment: 7 Vital Signs Time Taken: 08:00 Temperature (F): 98.0 Height (in): 65 Pulse (bpm): 66 Weight (lbs): 168 Respiratory Rate (breaths/min): 16 Body Mass Index (BMI): 28 Blood Pressure (mmHg): 150/92 Reference Range: 80 - 120 mg / dl Electronic Signature(s) Signed: 04/26/2016 10:35:38 AM By: Lorine Bears RCP, RRT, CHT Entered By: Becky Sax, Amado Nash on 04/26/2016 56:81:27

## 2016-04-26 NOTE — Progress Notes (Signed)
ROBERTT, BUDA ([id number]) Visit Report for 04/26/2016 HBO Details Patient Name: [name], [name] Date of Service: 04/26/2016 8:00 AM Medical Record Number: [id number] Patient Account Number: [id number] Date of Birth/Sex: 1955-08-18 (61 y.o. 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 HBO Treatment Details Treatment Number: 38 Patient Type: Outpatient Chamber Type: Monoplace Chamber Serial #: [id number] Treatment Protocol: 2.0 ATA with 90 minutes oxygen, and no air breaks Treatment Details Compression Rate Down: 1.5 psi / minute De-Compression Rate Up: 1.5 psi / minute Air breaks and breathing Compress Tx Pressure periods Decompress Decompress Begins Reached (leave unused spaces Begins Ends blank) Chamber Pressure (ATA) 1 2 - - - - - - 2 1 Clock Time (24 hr) 08:04 08:16 - - - - - - 09:46 09:56 Treatment Length: 112 (minutes) Treatment Segments: 4 Capillary Blood Glucose Pre Capillary Blood Glucose (mg/dl): Post Capillary Blood Glucose (mg/dl): Vital Signs Capillary Blood Glucose Reference Range: 80 - 120 mg / dl HBO Diabetic Blood Glucose Intervention Range: <131 mg/dl or >249 mg/dl Time Vitals Blood Respiratory Capillary Blood Glucose Pulse Action Type: Pulse: Temperature: Taken: Pressure: Rate: Glucose (mg/dl): Meter #: Oximetry (%) Taken: Pre 08:00 150/92 66 16 98 Post 09:58 158/100 66 16 98.3 Treatment Response Treatment Completion Status: Treatment Completed without Adverse Event [name], [name] ([id number]) HBO Attestation I certify that I supervised this HBO treatment in accordance with Medicare  guidelines. A trained Yes emergency response team is readily available per hospital policies and procedures. Continue HBOT as ordered. Yes Electronic Signature(s) Signed: 04/26/2016 10:08:32 AM By: Christin Fudge MD, FACS Entered By: Christin Fudge on 04/26/2016 10:08:31 [name], [name] ([id number]) -------------------------------------------------------------------------------- HBO Safety Checklist Details Patient Name: [name] Date of Service: 04/26/2016 8:00 AM Medical Record Number: [id number] Patient Account Number: [id number] Date of Birth/Sex: [birth year] (61 y.o. Male) Treating RN: Primary Care Metztli Sachdev: Annye Asa Other Clinician: Jacqulyn Bath Referring Desira Alessandrini: Annye Asa Treating Abrar Bilton/Extender: Frann Rider in Treatment: 7 HBO Safety Checklist Items Safety Checklist Consent Form Signed Patient voided / foley secured and emptied When did you last eato 04/26/16 am Last dose of injectable or oral agent n/a NA Ostomy pouch emptied and vented if applicable NA All implantable devices assessed, documented and approved NA Intravenous access site secured and place Valuables secured Linens and cotton and cotton/polyester blend (less than 51% polyester) Personal oil-based products / skin lotions / body lotions removed NA Wigs or hairpieces removed NA Smoking or tobacco materials removed Books / newspapers / magazines / loose paper removed Cologne, aftershave, perfume and deodorant removed Jewelry removed (may wrap wedding band) NA Make-up removed Hair care products removed Battery operated devices (external) removed Heating patches and chemical warmers removed NA Titanium eyewear removed NA Nail polish cured greater than 10 hours NA Casting material cured greater than 10 hours NA Hearing aids removed Loose dentures or partials removed NA Prosthetics have been removed Patient demonstrates correct use of air break device (if  applicable) Patient concerns have been addressed Patient grounding bracelet on and cord attached to chamber Specifics for Inpatients (complete in addition to above) Medication sheet sent with patient Intravenous medications needed or due during therapy sent with patient [name], [name] ([id number]) Drainage tubes (e.g. nasogastric tube or chest tube secured and vented) Endotracheal or Tracheotomy  tube secured Cuff deflated of air and inflated with saline Airway suctioned Electronic Signature(s) Signed: 04/26/2016 10:35:38 AM By: Lorine Bears RCP, RRT, CHT Entered By: Becky Sax, Amado Nash on 04/26/2016 08:21:07

## 2016-04-27 ENCOUNTER — Encounter: Payer: Federal, State, Local not specified - PPO | Admitting: Internal Medicine

## 2016-04-27 DIAGNOSIS — L598 Other specified disorders of the skin and subcutaneous tissue related to radiation: Secondary | ICD-10-CM | POA: Diagnosis not present

## 2016-04-27 DIAGNOSIS — N3041 Irradiation cystitis with hematuria: Secondary | ICD-10-CM | POA: Diagnosis not present

## 2016-04-27 DIAGNOSIS — Z8546 Personal history of malignant neoplasm of prostate: Secondary | ICD-10-CM | POA: Diagnosis not present

## 2016-04-27 NOTE — Progress Notes (Signed)
[name], [name] ([id number]) Visit Report for 04/27/2016 Arrival Information Details Patient Name: [name], [name] Date of Service: 04/27/2016 8:00 AM Medical Record Number: [id number] Patient Account Number: [id number] Date of Birth/Sex: 1955-11-28 (61 y.o. Male) Treating RN: Primary Care Gavriela Cashin: Annye Asa Other Clinician: Jacqulyn Bath Referring Alayjah Boehringer: Annye Asa Treating Amariah Kierstead/Extender: Tito Dine in Treatment: 7 Visit Information History Since Last Visit Added or deleted any medications: No Patient Arrived: Ambulatory Signs or symptoms of abuse/neglect since last No Arrival Time: 07:50 visito Accompanied By: self Pain Present Now: No Transfer Assistance: None Patient Identification Verified: Yes Secondary Verification Process Yes Completed: Patient Requires Transmission-Based No Precautions: Patient Has Alerts: No Electronic Signature(s) Signed: 04/27/2016 10:11:34 AM By: Lorine Bears RCP, RRT, CHT Entered By: Lorine Bears on 04/27/2016 08:08:47 [name], [name] ([id number]) -------------------------------------------------------------------------------- Encounter Discharge Information Details Patient Name: [name] Date of Service: 04/27/2016 8:00 AM Medical Record Number: [id number] Patient Account Number: [id number] Date of Birth/Sex: 14-Nov-1955 (61 y.o. Male) Treating RN: Primary Care Arm[place] Silveria: Annye Asa Other Clinician: Jacqulyn Bath Referring Bethel Gaglio: Annye Asa Treating Izaias Krupka/Extender: Tito Dine in Treatment: 7 Encounter Discharge Information Items Discharge Pain Level: 0 Discharge Condition: Stable Ambulatory Status: Ambulatory Discharge Destination: Home Private Transportation: Auto Accompanied By: self Schedule Follow-up Appointment: No Medication Reconciliation completed and No provided to Patient/Care Bartlomiej Jenkinson: Clinical Summary of  Care: Notes Patient has an HBO treatment scheduled on 04/28/16 at 08:00 am. Electronic Signature(s) Signed: 04/27/2016 10:11:34 AM By: Lorine Bears RCP, RRT, CHT Entered By: Lorine Bears on 04/27/2016 10:09:12 [name], [name] ([id number]) -------------------------------------------------------------------------------- Vitals Details Patient Name: [name] Date of Service: 04/27/2016 8:00 AM Medical Record Number: [id number] Patient Account Number: [id number] Date of Birth/Sex: [birth year] (61 y.o. Male) Treating RN: Primary Care Amylynn Fano: Annye Asa Other Clinician: Jacqulyn Bath Referring Czarina Gingras: Annye Asa Treating Dene Landsberg/Extender: Ricard Dillon Weeks in Treatment: 7 Vital Signs Time Taken: 07:57 Temperature (F): 98.4 Height (in): 65 Pulse (bpm): 72 Weight (lbs): 168 Respiratory Rate (breaths/min): 16 Body Mass Index (BMI): 28 Blood Pressure (mmHg): 142/96 Reference Range: 80 - 120 mg / dl Electronic Signature(s) Signed: 04/27/2016 10:11:34 AM By: Lorine Bears RCP, RRT, CHT Entered By: Lorine Bears on 04/27/2016 08:09:13

## 2016-04-28 ENCOUNTER — Encounter: Payer: Federal, State, Local not specified - PPO | Admitting: Internal Medicine

## 2016-04-28 DIAGNOSIS — N3041 Irradiation cystitis with hematuria: Secondary | ICD-10-CM | POA: Diagnosis not present

## 2016-04-28 DIAGNOSIS — L598 Other specified disorders of the skin and subcutaneous tissue related to radiation: Secondary | ICD-10-CM | POA: Diagnosis not present

## 2016-04-28 DIAGNOSIS — Z8546 Personal history of malignant neoplasm of prostate: Secondary | ICD-10-CM | POA: Diagnosis not present

## 2016-04-28 DIAGNOSIS — N3289 Other specified disorders of bladder: Secondary | ICD-10-CM | POA: Diagnosis not present

## 2016-04-28 NOTE — Progress Notes (Signed)
[name], [name] ([id number]) Visit Report for 04/27/2016 HBO Details Patient Name: [name], [name] Date of Service: 04/27/2016 8:00 AM Medical Record Number: [id number] Patient Account Number: [id number] Date of Birth/Sex: 18-Sep-1955 (61 y.o. 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 HBO Treatment Details Treatment Number: 39 Patient Type: Outpatient Chamber Type: Monoplace Chamber Serial #: [id number] Treatment Protocol: 2.0 ATA with 90 minutes oxygen, and no air breaks Treatment Details Compression Rate Down: 1.5 psi / minute De-Compression Rate Up: 1.5 psi / minute Air breaks and breathing Compress Tx Pressure periods Decompress Decompress Begins Reached (leave unused spaces Begins Ends blank) Chamber Pressure (ATA) 1 2 - - - - - - 2 1 Clock Time (24 hr) 08:04 08:16 - - - - - - 09:47 09:57 Treatment Length: 113 (minutes) Treatment Segments: 4 Capillary Blood Glucose Pre Capillary Blood Glucose (mg/dl): Post Capillary Blood Glucose (mg/dl): Vital Signs Capillary Blood Glucose Reference Range: 80 - 120 mg / dl HBO Diabetic Blood Glucose Intervention Range: <131 mg/dl or >249 mg/dl Time Vitals Blood Respiratory Capillary Blood Glucose Pulse Action Type: Pulse: Temperature: Taken: Pressure: Rate: Glucose (mg/dl): Meter #: Oximetry (%) Taken: Pre 07:57 142/96 72 16 98.4 Post 10:00 140/86 72 16 98.4 Treatment Response Treatment Completion Status: Treatment Completed without Adverse Event Hegeman, Shavon ([id number]) Jailene Cupit Notes No concerns with treatment given HBO Attestation I certify that I supervised  this HBO treatment in accordance with Medicare guidelines. A trained Yes emergency response team is readily available per hospital policies and procedures. Continue HBOT as ordered. Yes Electronic Signature(s) Signed: 04/28/2016 7:39:10 AM By: Linton Ham MD Previous Signature: 04/27/2016 10:11:34 AM Version By: Lorine Bears RCP, RRT, CHT Entered By: Linton Ham on 04/27/2016 12:25:29 Henkels, Jaden ([id number]) -------------------------------------------------------------------------------- HBO Safety Checklist Details Patient Name: [name] Date of Service: 04/27/2016 8:00 AM Medical Record Number: [id number] Patient Account Number: [id number] Date of Birth/Sex: 1955-09-16 (61 y.o. Male) Treating RN: Primary Care Aury Scollard: Annye Asa Other Clinician: Jacqulyn Bath Referring Clem Wisenbaker: Annye Asa Treating Otilio Groleau/Extender: Tito Dine in Treatment: 7 HBO Safety Checklist Items Safety Checklist Consent Form Signed Patient voided / foley secured and emptied When did you last eato 04/27/16 am Last dose of injectable or oral agent n/a NA Ostomy pouch emptied and vented if applicable NA All implantable devices assessed, documented and approved NA Intravenous access site secured and place Valuables secured Linens and cotton and cotton/polyester blend (less than 51% polyester) Personal oil-based products / skin lotions / body lotions removed NA Wigs or hairpieces removed NA Smoking or tobacco materials removed Books / newspapers / magazines / loose paper removed Cologne, aftershave, perfume and deodorant removed Jewelry removed (may wrap wedding band) Make-up removed Hair care products removed Battery operated devices (external) removed Heating patches and chemical warmers removed NA Titanium eyewear removed NA Nail polish cured greater than 10 hours NA Casting material cured greater than 10 hours NA Hearing aids  removed Loose dentures or partials removed NA Prosthetics have been removed Patient demonstrates correct use of air break device (if applicable) Patient concerns have been addressed Patient grounding bracelet on and cord attached to chamber Specifics for Inpatients (complete in addition to above) Medication sheet sent with patient Intravenous medications needed or due during therapy  sent with patient KIMI, KROFT ([id number]) Drainage tubes (e.g. nasogastric tube or chest tube secured and vented) Endotracheal or Tracheotomy tube secured Cuff deflated of air and inflated with saline Airway suctioned Electronic Signature(s) Signed: 04/27/2016 10:11:34 AM By: Lorine Bears RCP, RRT, CHT Entered By: Lorine Bears on 04/27/2016 08:15:25

## 2016-04-29 NOTE — Progress Notes (Signed)
SREEKAR, BROYHILL ([id number]) Visit Report for 04/28/2016 Arrival Information Details Patient Name: [name], [name] Date of Service: 04/28/2016 8:00 AM Medical Record Number: [id number] Patient Account Number: [id number] Date of Birth/Sex: [birth year] (61 y.o. Male) Treating RN: Primary Care Avari Gelles: Annye Asa Other Clinician: Jacqulyn Bath Referring Domenica Weightman: Annye Asa Treating Kili Gracy/Extender: Tito Dine in Treatment: 8 Visit Information History Since Last Visit Added or deleted any medications: No Patient Arrived: Ambulatory Any new allergies or adverse reactions: No Arrival Time: 07:50 Signs or symptoms of abuse/neglect since last No Accompanied By: self visito Transfer Assistance: None Hospitalized since last visit: No Patient Identification Verified: Yes Pain Present Now: No Secondary Verification Process Yes Completed: Patient Requires Transmission-Based No Precautions: Patient Has Alerts: No Electronic Signature(s) Signed: 04/28/2016 10:49:02 AM By: Lorine Bears RCP, RRT, CHT Entered By: Lorine Bears on 04/28/2016 08:14:45 Tolle, Chase ([id number]) -------------------------------------------------------------------------------- Encounter Discharge Information Details Patient Name: [name] Date of Service: 04/28/2016 8:00 AM Medical Record Number: [id number] Patient Account Number: [id number] Date of Birth/Sex: October 28, 1955 (61 y.o. Male) Treating RN: Primary Care Larosa Rhines: Annye Asa Other Clinician: Jacqulyn Bath Referring Jeweldean Drohan: Annye Asa Treating Capitola Ladson/Extender: Tito Dine in Treatment: 8 Encounter Discharge Information Items Discharge Pain Level: 0 Discharge Condition: Stable Ambulatory Status: Ambulatory Discharge Destination: Home Private Transportation: Auto Accompanied By: self Schedule Follow-up Appointment: No Medication Reconciliation completed  and No provided to Patient/Care Daneen Volcy: Clinical Summary of Care: Notes Patient completed his prescribed course of 40 HBO treatments today. Electronic Signature(s) Signed: 04/28/2016 10:49:02 AM By: Lorine Bears RCP, RRT, CHT Entered By: Lorine Bears on 04/28/2016 10:48:35 [place], [place] ([id number]) -------------------------------------------------------------------------------- Vitals Details Patient Name: [name] Date of Service: 04/28/2016 8:00 AM Medical Record Number: [id number] Patient Account Number: [id number] Date of Birth/Sex: [birth year] (61 y.o. Male) Treating RN: Primary Care Miryah Ralls: Annye Asa Other Clinician: Jacqulyn Bath Referring Maral Lampe: Annye Asa Treating Jacqueline Spofford/Extender: Tito Dine in Treatment: 8 Vital Signs Time Taken: 07:58 Temperature (F): 98.9 Height (in): 65 Pulse (bpm): 66 Weight (lbs): 168 Respiratory Rate (breaths/min): 16 Body Mass Index (BMI): 28 Blood Pressure (mmHg): 148/96 Reference Range: 80 - 120 mg / dl Electronic Signature(s) Signed: 04/28/2016 10:49:02 AM By: Lorine Bears RCP, RRT, CHT Entered By: Lorine Bears on 04/28/2016 08:15:40

## 2016-04-29 NOTE — Progress Notes (Signed)
[name], [name] ([id number]) Visit Report for 04/28/2016 HBO Details Patient Name: [name], [name] Date of Service: 04/28/2016 8:00 AM Medical Record Number: [id number] Patient Account Number: [id number] Date of Birth/Sex: [birth year] (61 y.o. 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 End Date: [name] Series Complete; Non- HBO Discharge Wound Protocol Completed with Outcome: Symptom Relief HBO [name] Details [name] Number: 40 Patient Type: Outpatient Chamber Type: Monoplace Chamber Serial #: [id number] [name] Protocol: 2.0 ATA with 90 minutes oxygen, and no air breaks [name] Details Compression Rate Down: 1.5 psi / minute De-Compression Rate Up: 1.5 psi / minute Air breaks and breathing Compress Tx Pressure periods Decompress Decompress Begins Reached (leave unused spaces Begins Ends blank) Chamber Pressure (ATA) 1 2 - - - - - - 2 1 Clock Time (24 hr) 08:05 08:17 - - - - - - 09:47 09:57 [name] Length: 112 (minutes) [name] Segments: 4 Capillary Blood Glucose Pre Capillary Blood Glucose (mg/dl): Post Capillary Blood Glucose (mg/dl): Vital Signs Capillary Blood Glucose Reference Range: 80 - 120 mg / dl HBO Diabetic Blood Glucose Intervention Range: <131 mg/dl or >249 mg/dl Time Vitals Blood Respiratory Capillary Blood Glucose Pulse Action Type: Pulse: Temperature: Taken: Pressure: Rate: Glucose (mg/dl): Meter #: Oximetry (%) Taken: Pre 07:58 148/96 66 16 98.9 Post 10:00 152/82 60 16 98.3 [name], [name] ([id number]) [name]  Response [name] Well Toleration: [name] [name] Completed without Adverse Event Completion Status: [name] Notes no concerns with [name] given HBO Attestation I certify that I supervised this HBO [name] in accordance with Medicare guidelines. A trained Yes emergency response team is readily available per hospital policies and procedures. Continue HBOT as ordered. Yes Electronic Signature(s) Signed: 04/28/2016 4:33:23 PM By: [name] Ham MD Previous Signature: 04/28/2016 10:49:02 AM Version By: [name] RCP, RRT, CHT Entered By: [name] on 04/28/2016 12:31:51 [name], [name] ([id number]) -------------------------------------------------------------------------------- HBO Safety Checklist Details Patient Name: [name] Date of Service: 04/28/2016 8:00 AM Medical Record Number: [id number] Patient Account Number: [id number] Date of Birth/Sex: 22-Nov-1955 (61 y.o. Male) Treating RN: Primary Care [name]: [name] Other Clinician: Jacqulyn [name] Referring Sammye Staff: [name] Treating Thayne Cindric/Extender: [name]: 8 HBO Safety Checklist Items Safety Checklist Consent Form Signed Patient voided / foley secured and emptied When did you last eato 04/28/16 am Last dose of injectable or oral agent n/a NA Ostomy pouch emptied and vented if applicable NA All implantable devices assessed, documented and approved NA Intravenous access site secured and place Valuables secured Linens and cotton and cotton/polyester blend (less than 51% polyester) Personal oil-based products / skin lotions / body lotions removed NA Wigs or hairpieces removed NA Smoking or tobacco materials removed Books / newspapers / magazines / loose paper removed Cologne, aftershave, perfume and deodorant removed Jewelry removed (may wrap wedding band) NA Make-up removed Hair care products removed Battery operated devices  (external) removed Heating patches and chemical warmers removed NA Titanium eyewear removed NA Nail polish cured greater than 10 hours NA Casting material cured greater than 10 hours NA Hearing aids removed Loose dentures or partials removed NA Prosthetics have been removed Patient demonstrates correct use of air break device (if applicable) Patient concerns have been addressed Patient grounding bracelet on and cord attached  to chamber Specifics for Inpatients (complete in addition to above) Medication sheet sent with patient Intravenous medications needed or due during therapy sent with patient [name], [name] ([id number]) Drainage tubes (e.g. nasogastric tube or chest tube secured and vented) Endotracheal or Tracheotomy tube secured Cuff deflated of air and inflated with saline Airway suctioned Electronic Signature(s) Signed: 04/28/2016 10:49:02 AM By: [name] RCP, RRT, CHT Entered By: [name] on 04/28/2016 08:17:52

## 2016-05-11 DIAGNOSIS — R3914 Feeling of incomplete bladder emptying: Secondary | ICD-10-CM | POA: Diagnosis not present

## 2016-05-11 DIAGNOSIS — R351 Nocturia: Secondary | ICD-10-CM | POA: Diagnosis not present

## 2016-05-11 DIAGNOSIS — R3915 Urgency of urination: Secondary | ICD-10-CM | POA: Diagnosis not present

## 2016-05-11 DIAGNOSIS — N3041 Irradiation cystitis with hematuria: Secondary | ICD-10-CM | POA: Diagnosis not present

## 2016-05-11 DIAGNOSIS — R350 Frequency of micturition: Secondary | ICD-10-CM | POA: Diagnosis not present

## 2016-05-14 ENCOUNTER — Other Ambulatory Visit: Payer: Self-pay | Admitting: Family Medicine

## 2016-05-16 ENCOUNTER — Other Ambulatory Visit: Payer: Self-pay | Admitting: Family Medicine

## 2016-05-18 ENCOUNTER — Ambulatory Visit (INDEPENDENT_AMBULATORY_CARE_PROVIDER_SITE_OTHER): Payer: Federal, State, Local not specified - PPO | Admitting: Family Medicine

## 2016-05-18 ENCOUNTER — Encounter: Payer: Self-pay | Admitting: Family Medicine

## 2016-05-18 VITALS — BP 124/81 | HR 72 | Resp 16 | Ht 65.0 in | Wt 162.1 lb

## 2016-05-18 DIAGNOSIS — E785 Hyperlipidemia, unspecified: Secondary | ICD-10-CM

## 2016-05-18 DIAGNOSIS — I10 Essential (primary) hypertension: Secondary | ICD-10-CM | POA: Diagnosis not present

## 2016-05-18 LAB — HEPATIC FUNCTION PANEL
ALBUMIN: 4.5 g/dL (ref 3.5–5.2)
ALT: 21 U/L (ref 0–53)
AST: 23 U/L (ref 0–37)
Alkaline Phosphatase: 43 U/L (ref 39–117)
Bilirubin, Direct: 0.2 mg/dL (ref 0.0–0.3)
TOTAL PROTEIN: 7.4 g/dL (ref 6.0–8.3)
Total Bilirubin: 0.8 mg/dL (ref 0.2–1.2)

## 2016-05-18 LAB — CBC WITH DIFFERENTIAL/PLATELET
BASOS ABS: 0.1 10*3/uL (ref 0.0–0.1)
BASOS PCT: 0.7 % (ref 0.0–3.0)
EOS ABS: 0.1 10*3/uL (ref 0.0–0.7)
EOS PCT: 0.9 % (ref 0.0–5.0)
HEMATOCRIT: 43 % (ref 39.0–52.0)
HEMOGLOBIN: 14.6 g/dL (ref 13.0–17.0)
LYMPHS PCT: 15.6 % (ref 12.0–46.0)
Lymphs Abs: 1.1 10*3/uL (ref 0.7–4.0)
MCHC: 34 g/dL (ref 30.0–36.0)
MCV: 98.5 fl (ref 78.0–100.0)
MONOS PCT: 9 % (ref 3.0–12.0)
Monocytes Absolute: 0.6 10*3/uL (ref 0.1–1.0)
Neutro Abs: 5.3 10*3/uL (ref 1.4–7.7)
Neutrophils Relative %: 73.8 % (ref 43.0–77.0)
Platelets: 284 10*3/uL (ref 150.0–400.0)
RBC: 4.37 Mil/uL (ref 4.22–5.81)
RDW: 13.8 % (ref 11.5–15.5)
WBC: 7.1 10*3/uL (ref 4.0–10.5)

## 2016-05-18 LAB — BASIC METABOLIC PANEL
BUN: 17 mg/dL (ref 6–23)
CALCIUM: 9.5 mg/dL (ref 8.4–10.5)
CO2: 29 meq/L (ref 19–32)
CREATININE: 1.06 mg/dL (ref 0.40–1.50)
Chloride: 100 mEq/L (ref 96–112)
GFR: 75.06 mL/min (ref >60.00)
GLUCOSE: 89 mg/dL (ref 70–99)
Potassium: 4.4 mEq/L (ref 3.5–5.1)
Sodium: 136 mEq/L (ref 135–145)

## 2016-05-18 LAB — LIPID PANEL
CHOLESTEROL: 139 mg/dL (ref 0–200)
HDL: 57.1 mg/dL (ref >39.00)
LDL CALC: 70 mg/dL (ref 0–99)
NONHDL: 82.35
Total CHOL/HDL Ratio: 2
Triglycerides: 60 mg/dL (ref 0.0–149.0)
VLDL: 12 mg/dL (ref 0.0–40.0)

## 2016-05-18 MED ORDER — LISINOPRIL 40 MG PO TABS: 40.0000 mg | ORAL_TABLET | Freq: Every day | ORAL | 1 refills | Status: DC

## 2016-05-18 MED ORDER — ATORVASTATIN CALCIUM 20 MG PO TABS: 20.0000 mg | ORAL_TABLET | Freq: Every day | ORAL | 1 refills | Status: DC

## 2016-05-18 MED ORDER — NEBIVOLOL HCL 10 MG PO TABS: 10.0000 mg | ORAL_TABLET | Freq: Every day | ORAL | 1 refills | Status: DC

## 2016-05-18 NOTE — Assessment & Plan Note (Signed)
Chronic problem.  Tolerating statin w/o difficulty.  Will increase dose to 20mg  daily as pt will not be able to eat or exercise as he normally does.  Check labs.  Will follow.

## 2016-05-18 NOTE — Progress Notes (Signed)
Pre visit review using our clinic review tool, if applicable. No additional management support is needed unless otherwise documented below in the visit note. 

## 2016-05-18 NOTE — Patient Instructions (Signed)
Schedule your complete physical in 6 months We'll notify you of your lab results and make any changes if needed Continue to work on healthy diet and regular exercise- you look great! Decrease the Bystolic and Lipitor to 1/2 tab when you get back on track Call with any questions or concerns Have a great summer!!!

## 2016-05-18 NOTE — Assessment & Plan Note (Signed)
Chronic problem.  Adequate control on Bystolic, Verapamil, Lisinopril but pt admits that it is hard to follow his strict diet and exercise plan while traveling.  Will increase Bystolic to 10mg  as pt reports when he doesn't eat well or exercises, the BP goes up.  Will continue to follow closely.

## 2016-05-18 NOTE — Progress Notes (Signed)
   Subjective:    Patient ID: [name], male    DOB: 1955/05/16, 61 y.o.   MRN: [id number]  HPI HTN- chronic problem.  Adequate control on Bystolic 5mg , Verapamil 360mg , and Lisinopril 40mg  daily.  Pt would like to increase Bystolic to 10mg  daily b/c he has a lot of travel upcoming and will not be able to adhere to his strict diet and exercise program.  Denies CP, SOB, HAs, visual changes, edema.  Hyperlipidemia- chronic problem, on Lipitor 10mg  daily.  Denies abd pain, N/V, myalgias.   Review of Systems For ROS see HPI     Objective:   Physical Exam  Constitutional: He is oriented to person, place, and time. He appears well-developed and well-nourished. No distress.  HENT:  Head: Normocephalic and atraumatic.  Eyes: Conjunctivae and EOM are normal. Pupils are equal, round, and reactive to light.  Neck: Normal range of motion. Neck supple. No thyromegaly present.  Cardiovascular: Normal rate, regular rhythm, normal heart sounds and intact distal pulses.   No murmur heard. Pulmonary/Chest: Effort normal and breath sounds normal. No respiratory distress.  Abdominal: Soft. Bowel sounds are normal. He exhibits no distension.  Musculoskeletal: He exhibits no edema.  Lymphadenopathy:    He has no cervical adenopathy.  Neurological: He is alert and oriented to person, place, and time. No cranial nerve deficit.  Skin: Skin is warm and dry.  Psychiatric: He has a normal mood and affect. His behavior is normal.  Vitals reviewed.         Assessment & Plan:

## 2016-05-19 ENCOUNTER — Other Ambulatory Visit: Payer: Self-pay | Admitting: General Practice

## 2016-05-19 MED ORDER — VERAPAMIL HCL ER 360 MG PO CP24: ORAL_CAPSULE | ORAL | 1 refills | Status: DC

## 2016-05-21 ENCOUNTER — Telehealth: Payer: Self-pay | Admitting: Family Medicine

## 2016-05-21 ENCOUNTER — Other Ambulatory Visit: Payer: Self-pay | Admitting: General Practice

## 2016-05-21 MED ORDER — VERAPAMIL HCL ER 300 MG PO CP24: 300.0000 mg | ORAL_CAPSULE | Freq: Every day | ORAL | 1 refills | Status: DC

## 2016-05-21 NOTE — Telephone Encounter (Signed)
Pt made aware

## 2016-05-21 NOTE — Telephone Encounter (Signed)
Received a fax from [facility] they can fill the verapamil 300mg  not the 360mg . Per PCP ok to take 300mg  ER. New rx sent.

## 2016-05-21 NOTE — Telephone Encounter (Signed)
Is he on the name brand and this is the one that is being discontinued?  I can't imagine they would discontinue the generic version of this

## 2016-05-21 NOTE — Telephone Encounter (Signed)
So I spoke with [name] at the [facility] he advised that there was not a back order and they can get the medication.    I spoke with pt's pharmacy and they advised that [facility] does not have any in stock and they can no longer order it. Please advise?

## 2016-05-21 NOTE — Telephone Encounter (Signed)
Pt states that he was informed that verapamil will no longer be avail. Manufacturer has stopped making this, pt was able to get a 60 day supply and asking what could this be changed to.

## 2016-05-24 DIAGNOSIS — R338 Other retention of urine: Secondary | ICD-10-CM | POA: Diagnosis not present

## 2016-05-24 DIAGNOSIS — N3041 Irradiation cystitis with hematuria: Secondary | ICD-10-CM | POA: Diagnosis not present

## 2016-05-24 DIAGNOSIS — C61 Malignant neoplasm of prostate: Secondary | ICD-10-CM | POA: Diagnosis not present

## 2016-05-24 DIAGNOSIS — D400 Neoplasm of uncertain behavior of prostate: Secondary | ICD-10-CM | POA: Diagnosis not present

## 2016-05-24 DIAGNOSIS — N359 Urethral stricture, unspecified: Secondary | ICD-10-CM | POA: Diagnosis not present

## 2016-06-01 DIAGNOSIS — C61 Malignant neoplasm of prostate: Secondary | ICD-10-CM | POA: Diagnosis not present

## 2016-06-01 DIAGNOSIS — R338 Other retention of urine: Secondary | ICD-10-CM | POA: Diagnosis not present

## 2016-06-01 DIAGNOSIS — N3041 Irradiation cystitis with hematuria: Secondary | ICD-10-CM | POA: Diagnosis not present

## 2016-06-01 DIAGNOSIS — D400 Neoplasm of uncertain behavior of prostate: Secondary | ICD-10-CM | POA: Diagnosis not present

## 2016-06-11 DIAGNOSIS — R338 Other retention of urine: Secondary | ICD-10-CM | POA: Diagnosis not present

## 2016-06-11 DIAGNOSIS — C61 Malignant neoplasm of prostate: Secondary | ICD-10-CM | POA: Diagnosis not present

## 2016-06-11 DIAGNOSIS — N359 Urethral stricture, unspecified: Secondary | ICD-10-CM | POA: Diagnosis not present

## 2016-06-11 DIAGNOSIS — D400 Neoplasm of uncertain behavior of prostate: Secondary | ICD-10-CM | POA: Diagnosis not present

## 2016-06-28 DIAGNOSIS — C61 Malignant neoplasm of prostate: Secondary | ICD-10-CM | POA: Diagnosis not present

## 2016-06-28 DIAGNOSIS — N3041 Irradiation cystitis with hematuria: Secondary | ICD-10-CM | POA: Diagnosis not present

## 2016-06-28 DIAGNOSIS — N359 Urethral stricture, unspecified: Secondary | ICD-10-CM | POA: Diagnosis not present

## 2016-06-28 DIAGNOSIS — R338 Other retention of urine: Secondary | ICD-10-CM | POA: Diagnosis not present

## 2016-07-16 ENCOUNTER — Other Ambulatory Visit: Payer: Self-pay | Admitting: Family Medicine

## 2016-07-19 DIAGNOSIS — R338 Other retention of urine: Secondary | ICD-10-CM | POA: Diagnosis not present

## 2016-07-19 DIAGNOSIS — C61 Malignant neoplasm of prostate: Secondary | ICD-10-CM | POA: Diagnosis not present

## 2016-07-19 DIAGNOSIS — N359 Urethral stricture, unspecified: Secondary | ICD-10-CM | POA: Diagnosis not present

## 2016-07-19 DIAGNOSIS — N3041 Irradiation cystitis with hematuria: Secondary | ICD-10-CM | POA: Diagnosis not present

## 2016-07-20 ENCOUNTER — Telehealth: Payer: Self-pay | Admitting: General Practice

## 2016-07-20 DIAGNOSIS — H2513 Age-related nuclear cataract, bilateral: Secondary | ICD-10-CM | POA: Diagnosis not present

## 2016-07-20 DIAGNOSIS — H5213 Myopia, bilateral: Secondary | ICD-10-CM | POA: Diagnosis not present

## 2016-07-20 DIAGNOSIS — H25013 Cortical age-related cataract, bilateral: Secondary | ICD-10-CM | POA: Diagnosis not present

## 2016-07-20 DIAGNOSIS — H40013 Open angle with borderline findings, low risk, bilateral: Secondary | ICD-10-CM | POA: Diagnosis not present

## 2016-07-20 NOTE — Telephone Encounter (Signed)
Please advise, we received a fax form [facility] advising that pt's verapamil is on back order. Please advise on alternative medication.

## 2016-07-21 MED ORDER — DILTIAZEM HCL ER COATED BEADS 360 MG PO TB24: 360.0000 mg | ORAL_TABLET | Freq: Every day | ORAL | 6 refills | Status: DC

## 2016-07-21 NOTE — Addendum Note (Signed)
Addended by: Davis Gourd on: 07/21/2016 11:43 AM   Modules accepted: Orders

## 2016-07-21 NOTE — Telephone Encounter (Signed)
Pt made aware, new rx sent to pharmacy.

## 2016-07-21 NOTE — Telephone Encounter (Signed)
Can switch to Diltiazem LA or XT (long acting) 360mg  daily but should monitor his HR and BP with the medication change

## 2016-07-30 DIAGNOSIS — H25812 Combined forms of age-related cataract, left eye: Secondary | ICD-10-CM | POA: Diagnosis not present

## 2016-08-02 DIAGNOSIS — R338 Other retention of urine: Secondary | ICD-10-CM | POA: Diagnosis not present

## 2016-08-02 DIAGNOSIS — C61 Malignant neoplasm of prostate: Secondary | ICD-10-CM | POA: Diagnosis not present

## 2016-08-02 DIAGNOSIS — N390 Urinary tract infection, site not specified: Secondary | ICD-10-CM | POA: Diagnosis not present

## 2016-08-02 DIAGNOSIS — R3914 Feeling of incomplete bladder emptying: Secondary | ICD-10-CM | POA: Diagnosis not present

## 2016-08-02 DIAGNOSIS — N3041 Irradiation cystitis with hematuria: Secondary | ICD-10-CM | POA: Diagnosis not present

## 2016-08-02 DIAGNOSIS — R350 Frequency of micturition: Secondary | ICD-10-CM | POA: Diagnosis not present

## 2016-08-02 DIAGNOSIS — D400 Neoplasm of uncertain behavior of prostate: Secondary | ICD-10-CM | POA: Diagnosis not present

## 2016-08-09 DIAGNOSIS — N3041 Irradiation cystitis with hematuria: Secondary | ICD-10-CM | POA: Diagnosis not present

## 2016-08-09 DIAGNOSIS — C61 Malignant neoplasm of prostate: Secondary | ICD-10-CM | POA: Diagnosis not present

## 2016-08-09 DIAGNOSIS — N359 Urethral stricture, unspecified: Secondary | ICD-10-CM | POA: Diagnosis not present

## 2016-08-09 DIAGNOSIS — D400 Neoplasm of uncertain behavior of prostate: Secondary | ICD-10-CM | POA: Diagnosis not present

## 2016-08-16 DIAGNOSIS — H2511 Age-related nuclear cataract, right eye: Secondary | ICD-10-CM | POA: Diagnosis not present

## 2016-08-16 DIAGNOSIS — H25811 Combined forms of age-related cataract, right eye: Secondary | ICD-10-CM | POA: Diagnosis not present

## 2016-08-26 DIAGNOSIS — N3041 Irradiation cystitis with hematuria: Secondary | ICD-10-CM | POA: Diagnosis not present

## 2016-08-26 DIAGNOSIS — N390 Urinary tract infection, site not specified: Secondary | ICD-10-CM | POA: Diagnosis not present

## 2016-08-26 DIAGNOSIS — N359 Urethral stricture, unspecified: Secondary | ICD-10-CM | POA: Diagnosis not present

## 2016-09-09 DIAGNOSIS — D400 Neoplasm of uncertain behavior of prostate: Secondary | ICD-10-CM | POA: Diagnosis not present

## 2016-09-09 DIAGNOSIS — N3041 Irradiation cystitis with hematuria: Secondary | ICD-10-CM | POA: Diagnosis not present

## 2016-09-09 DIAGNOSIS — C61 Malignant neoplasm of prostate: Secondary | ICD-10-CM | POA: Diagnosis not present

## 2016-09-09 DIAGNOSIS — N359 Urethral stricture, unspecified: Secondary | ICD-10-CM | POA: Diagnosis not present

## 2016-09-16 DIAGNOSIS — H25812 Combined forms of age-related cataract, left eye: Secondary | ICD-10-CM | POA: Diagnosis not present

## 2016-09-16 DIAGNOSIS — H2512 Age-related nuclear cataract, left eye: Secondary | ICD-10-CM | POA: Diagnosis not present

## 2016-10-21 DIAGNOSIS — D400 Neoplasm of uncertain behavior of prostate: Secondary | ICD-10-CM | POA: Diagnosis not present

## 2016-10-21 DIAGNOSIS — N3041 Irradiation cystitis with hematuria: Secondary | ICD-10-CM | POA: Diagnosis not present

## 2016-10-21 DIAGNOSIS — C61 Malignant neoplasm of prostate: Secondary | ICD-10-CM | POA: Diagnosis not present

## 2016-10-21 DIAGNOSIS — N35911 Unspecified urethral stricture, male, meatal: Secondary | ICD-10-CM | POA: Diagnosis not present

## 2016-10-26 ENCOUNTER — Ambulatory Visit: Payer: Federal, State, Local not specified - PPO | Admitting: Family Medicine

## 2016-10-26 ENCOUNTER — Ambulatory Visit (INDEPENDENT_AMBULATORY_CARE_PROVIDER_SITE_OTHER): Payer: Federal, State, Local not specified - PPO | Admitting: Family Medicine

## 2016-10-26 ENCOUNTER — Encounter: Payer: Self-pay | Admitting: Family Medicine

## 2016-10-26 VITALS — BP 138/86 | HR 80 | Temp 98.3°F | Resp 16 | Ht 65.0 in | Wt 175.0 lb

## 2016-10-26 DIAGNOSIS — L089 Local infection of the skin and subcutaneous tissue, unspecified: Secondary | ICD-10-CM | POA: Diagnosis not present

## 2016-10-26 DIAGNOSIS — S61431A Puncture wound without foreign body of right hand, initial encounter: Secondary | ICD-10-CM

## 2016-10-26 MED ORDER — DOXYCYCLINE HYCLATE 100 MG PO TABS: 100.0000 mg | ORAL_TABLET | Freq: Two times a day (BID) | ORAL | 0 refills | Status: DC

## 2016-10-26 NOTE — Progress Notes (Signed)
   Subjective:    Patient ID: [name], male    DOB: July 01, 1955, 61 y.o.   MRN: [id number]  HPI Wound infection- pt was installing a remote starter on car yesterday when he stabbed his R hand with a screwdriver.  This AM hand is red, swollen, sore, and there was pus when he squeezed the area.  Tdap is UTD.  Cleaned area w/ peroxide after injury.   Review of Systems For ROS see HPI     Objective:   Physical Exam  Constitutional: He is oriented to person, place, and time. He appears well-developed and well-nourished. No distress.  Cardiovascular: Intact distal pulses.   Neurological: He is alert and oriented to person, place, and time.  Skin: Skin is warm. There is erythema (0.5 cm puncture wound to webbing between thumb and first finger on R hand.  no obvious pus or fluid collection but + swelling and redness extending to MCP joint of 1st finger on dorsum of hand).  Psychiatric: He has a normal mood and affect. His behavior is normal. Thought content normal.  Vitals reviewed.         Assessment & Plan:  Infected wound- area cleaned w/ H2O2, abx ointment applied, and area bandaged.  Start Doxy.  Tdap UTD.  Reviewed supportive care and red flags that should prompt return.  Pt expressed understanding and is in agreement w/ plan.

## 2016-10-26 NOTE — Patient Instructions (Signed)
Follow up as scheduled or as needed Start the Doxycycline twice daily- take w/ food Keep area clean and dry If the redness spreads or the pain, swelling or drainage worsens- please go to the ER Call with any questions or concerns Hang in there!!!

## 2016-11-03 ENCOUNTER — Other Ambulatory Visit: Payer: Self-pay | Admitting: Family Medicine

## 2016-11-04 DIAGNOSIS — M1711 Unilateral primary osteoarthritis, right knee: Secondary | ICD-10-CM | POA: Diagnosis not present

## 2016-11-04 DIAGNOSIS — M25562 Pain in left knee: Secondary | ICD-10-CM | POA: Diagnosis not present

## 2016-11-04 DIAGNOSIS — M1712 Unilateral primary osteoarthritis, left knee: Secondary | ICD-10-CM | POA: Diagnosis not present

## 2016-11-04 DIAGNOSIS — M25561 Pain in right knee: Secondary | ICD-10-CM | POA: Diagnosis not present

## 2016-11-22 ENCOUNTER — Encounter: Payer: Self-pay | Admitting: Family Medicine

## 2016-11-22 ENCOUNTER — Other Ambulatory Visit: Payer: Self-pay

## 2016-11-22 ENCOUNTER — Ambulatory Visit (INDEPENDENT_AMBULATORY_CARE_PROVIDER_SITE_OTHER): Payer: Federal, State, Local not specified - PPO | Admitting: Family Medicine

## 2016-11-22 VITALS — BP 126/81 | HR 65 | Temp 98.1°F | Resp 16 | Ht 65.0 in | Wt 165.1 lb

## 2016-11-22 DIAGNOSIS — Z0000 Encounter for general adult medical examination without abnormal findings: Secondary | ICD-10-CM

## 2016-11-22 DIAGNOSIS — C61 Malignant neoplasm of prostate: Secondary | ICD-10-CM

## 2016-11-22 DIAGNOSIS — E785 Hyperlipidemia, unspecified: Secondary | ICD-10-CM | POA: Diagnosis not present

## 2016-11-22 DIAGNOSIS — I10 Essential (primary) hypertension: Secondary | ICD-10-CM | POA: Diagnosis not present

## 2016-11-22 LAB — CBC WITH DIFFERENTIAL/PLATELET
Basophils Absolute: 0 10*3/uL (ref 0.0–0.1)
Basophils Relative: 0.5 % (ref 0.0–3.0)
EOS PCT: 0.3 % (ref 0.0–5.0)
Eosinophils Absolute: 0 10*3/uL (ref 0.0–0.7)
HEMATOCRIT: 42.6 % (ref 39.0–52.0)
Hemoglobin: 14.6 g/dL (ref 13.0–17.0)
LYMPHS ABS: 0.8 10*3/uL (ref 0.7–4.0)
LYMPHS PCT: 16.1 % (ref 12.0–46.0)
MCHC: 34.4 g/dL (ref 30.0–36.0)
MCV: 96.4 fl (ref 78.0–100.0)
MONOS PCT: 6.8 % (ref 3.0–12.0)
Monocytes Absolute: 0.3 10*3/uL (ref 0.1–1.0)
Neutro Abs: 3.7 10*3/uL (ref 1.4–7.7)
Neutrophils Relative %: 76.3 % (ref 43.0–77.0)
PLATELETS: 238 10*3/uL (ref 150.0–400.0)
RBC: 4.41 Mil/uL (ref 4.22–5.81)
RDW: 12.8 % (ref 11.5–15.5)
WBC: 4.9 10*3/uL (ref 4.0–10.5)

## 2016-11-22 LAB — LIPID PANEL
CHOL/HDL RATIO: 3
Cholesterol: 134 mg/dL (ref 0–200)
HDL: 52.7 mg/dL (ref >39.00)
LDL CALC: 68 mg/dL (ref 0–99)
NonHDL: 81.55
Triglycerides: 70 mg/dL (ref 0.0–149.0)
VLDL: 14 mg/dL (ref 0.0–40.0)

## 2016-11-22 LAB — BASIC METABOLIC PANEL
BUN: 19 mg/dL (ref 6–23)
CALCIUM: 9.9 mg/dL (ref 8.4–10.5)
CO2: 27 mEq/L (ref 19–32)
Chloride: 99 mEq/L (ref 96–112)
Creatinine, Ser: 1.21 mg/dL (ref 0.40–1.50)
GFR: 64.32 mL/min (ref >60.00)
Glucose, Bld: 109 mg/dL — ABNORMAL HIGH (ref 70–99)
POTASSIUM: 4.7 meq/L (ref 3.5–5.1)
SODIUM: 135 meq/L (ref 135–145)

## 2016-11-22 LAB — HEPATIC FUNCTION PANEL
ALBUMIN: 4.4 g/dL (ref 3.5–5.2)
ALK PHOS: 49 U/L (ref 39–117)
ALT: 27 U/L (ref 0–53)
AST: 30 U/L (ref 0–37)
Bilirubin, Direct: 0.2 mg/dL (ref 0.0–0.3)
Total Bilirubin: 0.8 mg/dL (ref 0.2–1.2)
Total Protein: 7.4 g/dL (ref 6.0–8.3)

## 2016-11-22 LAB — TSH: TSH: 1.06 u[IU]/mL (ref 0.35–4.50)

## 2016-11-22 NOTE — Progress Notes (Signed)
   Subjective:    Patient ID: [name], male    DOB: November 21, 1955, 61 y.o.   MRN: [id number]  HPI CPE- UTD on colonoscopy, urology, Immunizations.  Pt has lost 10 lbs since last visit.   Review of Systems Patient reports no vision/hearing changes, anorexia, fever ,adenopathy, persistant/recurrent hoarseness, swallowing issues, chest pain, palpitations, edema, persistant/recurrent cough, hemoptysis, dyspnea (rest,exertional, paroxysmal nocturnal), gastrointestinal  bleeding (melena, rectal bleeding), abdominal pain, excessive heart burn, GU symptoms (dysuria, hematuria, voiding/incontinence issues) syncope, focal weakness, memory loss, numbness & tingling, skin/hair/nail changes, depression, anxiety, abnormal bruising/bleeding, musculoskeletal symptoms/signs.     Objective:   Physical Exam General Appearance:    Alert, cooperative, no distress, appears stated age  Head:    Normocephalic, without obvious abnormality, atraumatic  Eyes:    PERRL, conjunctiva/corneas clear, EOM's intact, fundi    benign, both eyes       Ears:    Normal TM's and external ear canals, both ears  Nose:   Nares normal, septum midline, mucosa normal, no drainage   or sinus tenderness  Throat:   Lips, mucosa, and tongue normal; teeth and gums normal  Neck:   Supple, symmetrical, trachea midline, no adenopathy;       thyroid:  No enlargement/tenderness/nodules  Back:     Symmetric, no curvature, ROM normal, no CVA tenderness  Lungs:     Clear to auscultation bilaterally, respirations unlabored  Chest wall:    No tenderness or deformity  Heart:    Regular rate and rhythm, S1 and S2 normal, no murmur, rub   or gallop  Abdomen:     Soft, non-tender, bowel sounds active all four quadrants,    no masses, no organomegaly  Genitalia:    Deferred to urology  Rectal:    Extremities:   Extremities normal, atraumatic, no cyanosis or edema  Pulses:   2+ and symmetric all extremities  Skin:   Skin color, texture, turgor  normal, no rashes or lesions  Lymph nodes:   Cervical, supraclavicular, and axillary nodes normal  Neurologic:   CNII-XII intact. Normal strength, sensation and reflexes      throughout          Assessment & Plan:

## 2016-11-22 NOTE — Assessment & Plan Note (Signed)
Chronic problem.  Following w/ Dr Yves Dill.  Continues to have episodic bleeding but currently asymptomatic.  Will follow along.

## 2016-11-22 NOTE — Patient Instructions (Signed)
Follow up in 6 months to recheck BP and cholesterol We'll notify you of your lab results and make any changes if needed Continue to work on healthy diet and regular exercise- you look great! Call with any questions or concerns Happy Thanksgiving!!!

## 2016-11-22 NOTE — Assessment & Plan Note (Signed)
Chronic problem.  Tolerating statin w/o difficulty.  Check labs.  Adjust meds prn  

## 2016-11-22 NOTE — Assessment & Plan Note (Signed)
Pt's PE WNL.  UTD on colonoscopy and immunizations.  Check labs.  Anticipatory guidance provided.

## 2016-11-22 NOTE — Assessment & Plan Note (Signed)
Chronic problem.  Well controlled today.  Asymptomatic.  No anticipated med changes.  Will follow.

## 2016-11-23 ENCOUNTER — Other Ambulatory Visit (INDEPENDENT_AMBULATORY_CARE_PROVIDER_SITE_OTHER): Payer: Federal, State, Local not specified - PPO

## 2016-11-23 DIAGNOSIS — R7309 Other abnormal glucose: Secondary | ICD-10-CM | POA: Diagnosis not present

## 2016-11-23 LAB — HEMOGLOBIN A1C: Hgb A1c MFr Bld: 5.1 % (ref 4.6–6.5)

## 2016-11-24 ENCOUNTER — Other Ambulatory Visit: Payer: Self-pay | Admitting: General Practice

## 2016-11-24 ENCOUNTER — Telehealth: Payer: Self-pay | Admitting: *Deleted

## 2016-11-24 DIAGNOSIS — R7989 Other specified abnormal findings of blood chemistry: Secondary | ICD-10-CM

## 2016-11-24 NOTE — Telephone Encounter (Signed)
Patient called questioning why he needed another lab visit.  Explained that according to his last labs his kidney function was elevated and we are wanting him to hydrate and repeat BMP in 2-3 weeks.  Patient stated verbal understanding and said he would check his schedule and call back to schedule a lab appointment.

## 2016-12-06 DIAGNOSIS — M1711 Unilateral primary osteoarthritis, right knee: Secondary | ICD-10-CM | POA: Diagnosis not present

## 2016-12-06 DIAGNOSIS — M1712 Unilateral primary osteoarthritis, left knee: Secondary | ICD-10-CM | POA: Diagnosis not present

## 2016-12-10 ENCOUNTER — Other Ambulatory Visit (INDEPENDENT_AMBULATORY_CARE_PROVIDER_SITE_OTHER): Payer: Federal, State, Local not specified - PPO

## 2016-12-10 DIAGNOSIS — R7989 Other specified abnormal findings of blood chemistry: Secondary | ICD-10-CM | POA: Diagnosis not present

## 2016-12-10 LAB — BASIC METABOLIC PANEL
BUN: 20 mg/dL (ref 6–23)
CHLORIDE: 100 meq/L (ref 96–112)
CO2: 28 meq/L (ref 19–32)
CREATININE: 0.98 mg/dL (ref 0.40–1.50)
Calcium: 9.2 mg/dL (ref 8.4–10.5)
GFR: 82.02 mL/min (ref >60.00)
GLUCOSE: 88 mg/dL (ref 70–99)
Potassium: 4.3 mEq/L (ref 3.5–5.1)
Sodium: 136 mEq/L (ref 135–145)

## 2016-12-13 ENCOUNTER — Other Ambulatory Visit: Payer: Federal, State, Local not specified - PPO

## 2016-12-19 ENCOUNTER — Other Ambulatory Visit: Payer: Self-pay | Admitting: Family Medicine

## 2017-01-13 DIAGNOSIS — N3041 Irradiation cystitis with hematuria: Secondary | ICD-10-CM | POA: Diagnosis not present

## 2017-01-13 DIAGNOSIS — C61 Malignant neoplasm of prostate: Secondary | ICD-10-CM | POA: Diagnosis not present

## 2017-01-13 DIAGNOSIS — D400 Neoplasm of uncertain behavior of prostate: Secondary | ICD-10-CM | POA: Diagnosis not present

## 2017-01-21 ENCOUNTER — Other Ambulatory Visit: Payer: Self-pay | Admitting: Family Medicine

## 2017-01-21 NOTE — Telephone Encounter (Signed)
Medication filled to pharmacy as requested.   

## 2017-03-21 IMAGING — CR DG CHEST 2V
1 series · 2 of 2 positions shown · non-contrast
Comparison: None.

CLINICAL DATA: For hyperbaric oxygen clearance.  Hypertension.

EXAM:
CHEST  2 VIEW

[Series 1: w chest pa · 0.14mm/px · 2 of 2 slices shown]
[im 1/2]
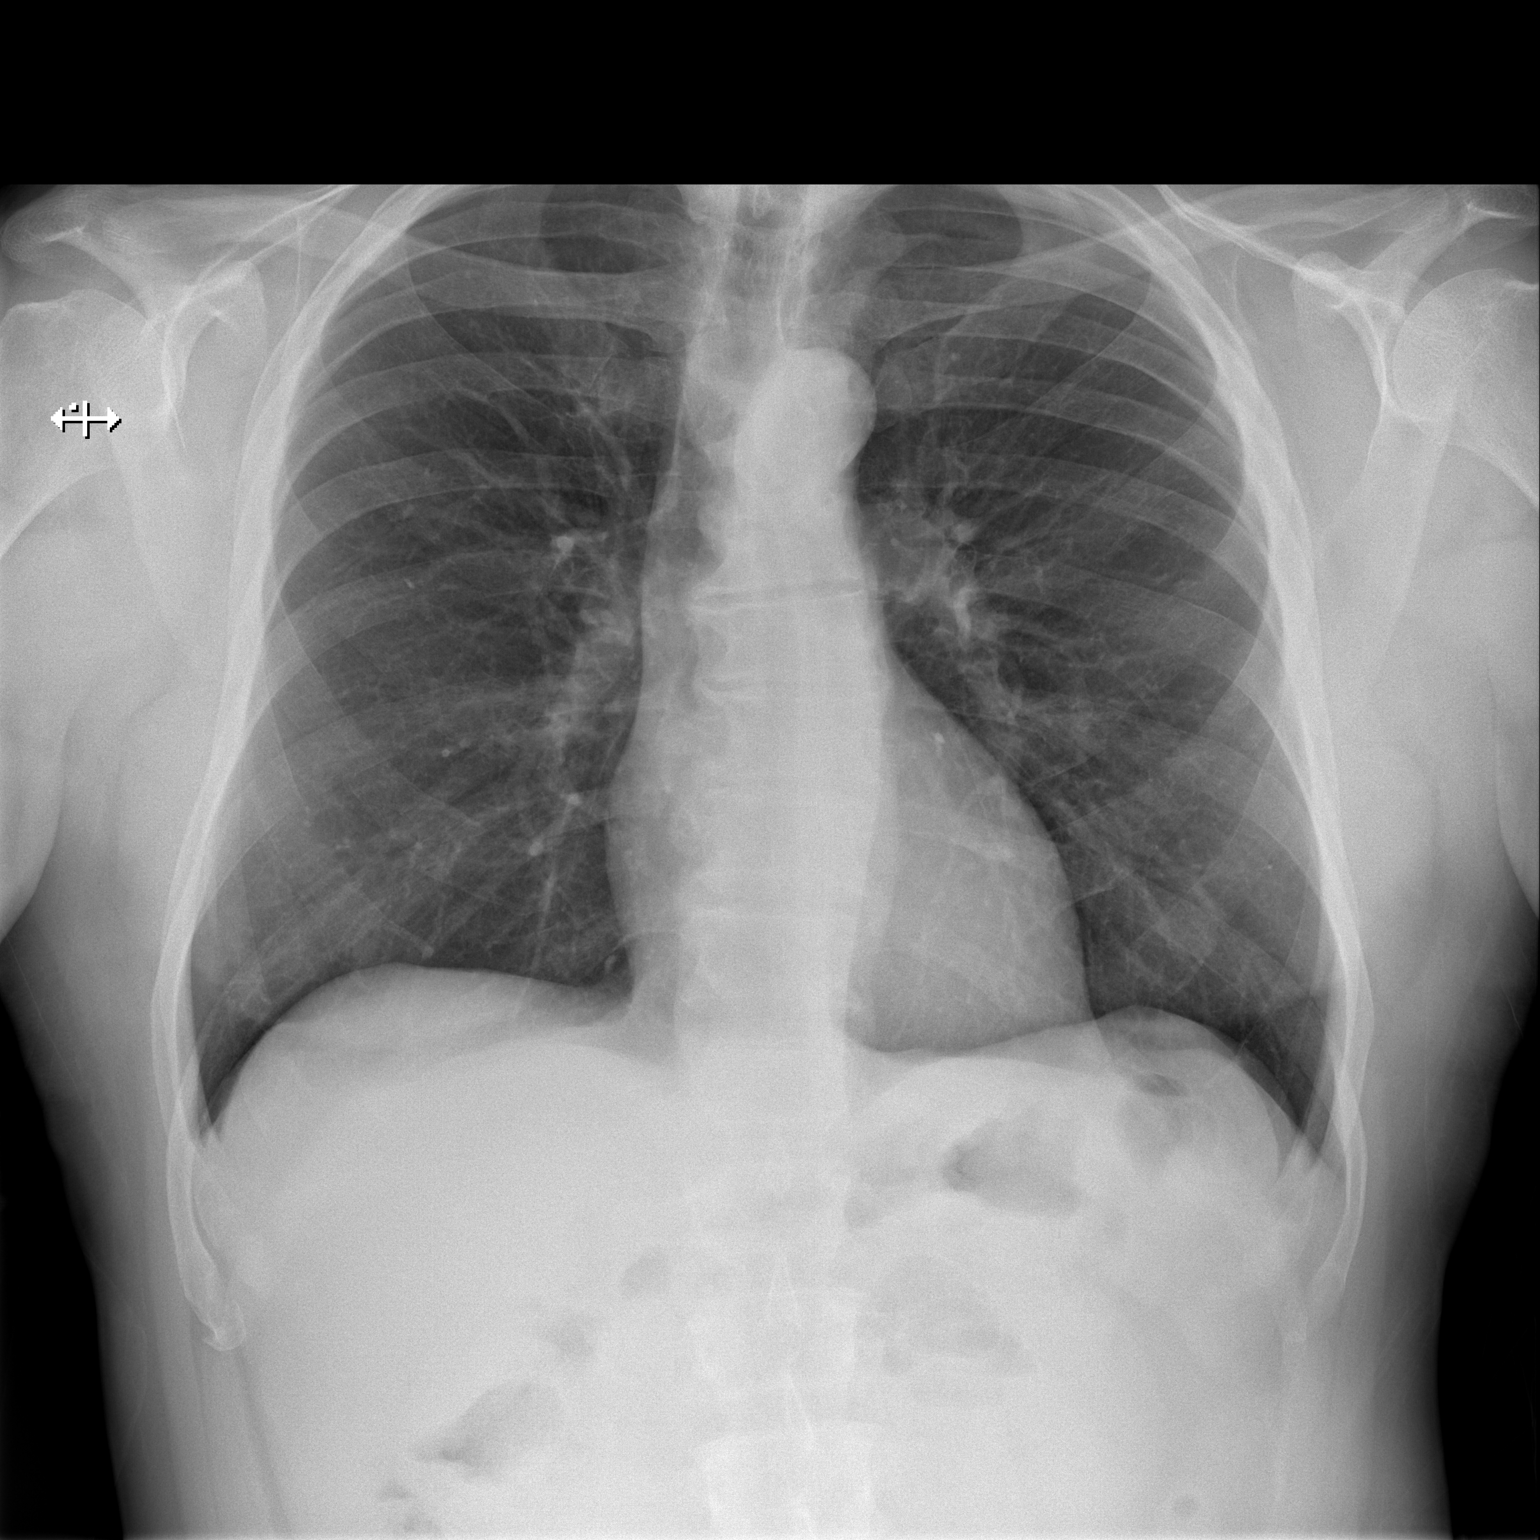
[im 2/2]
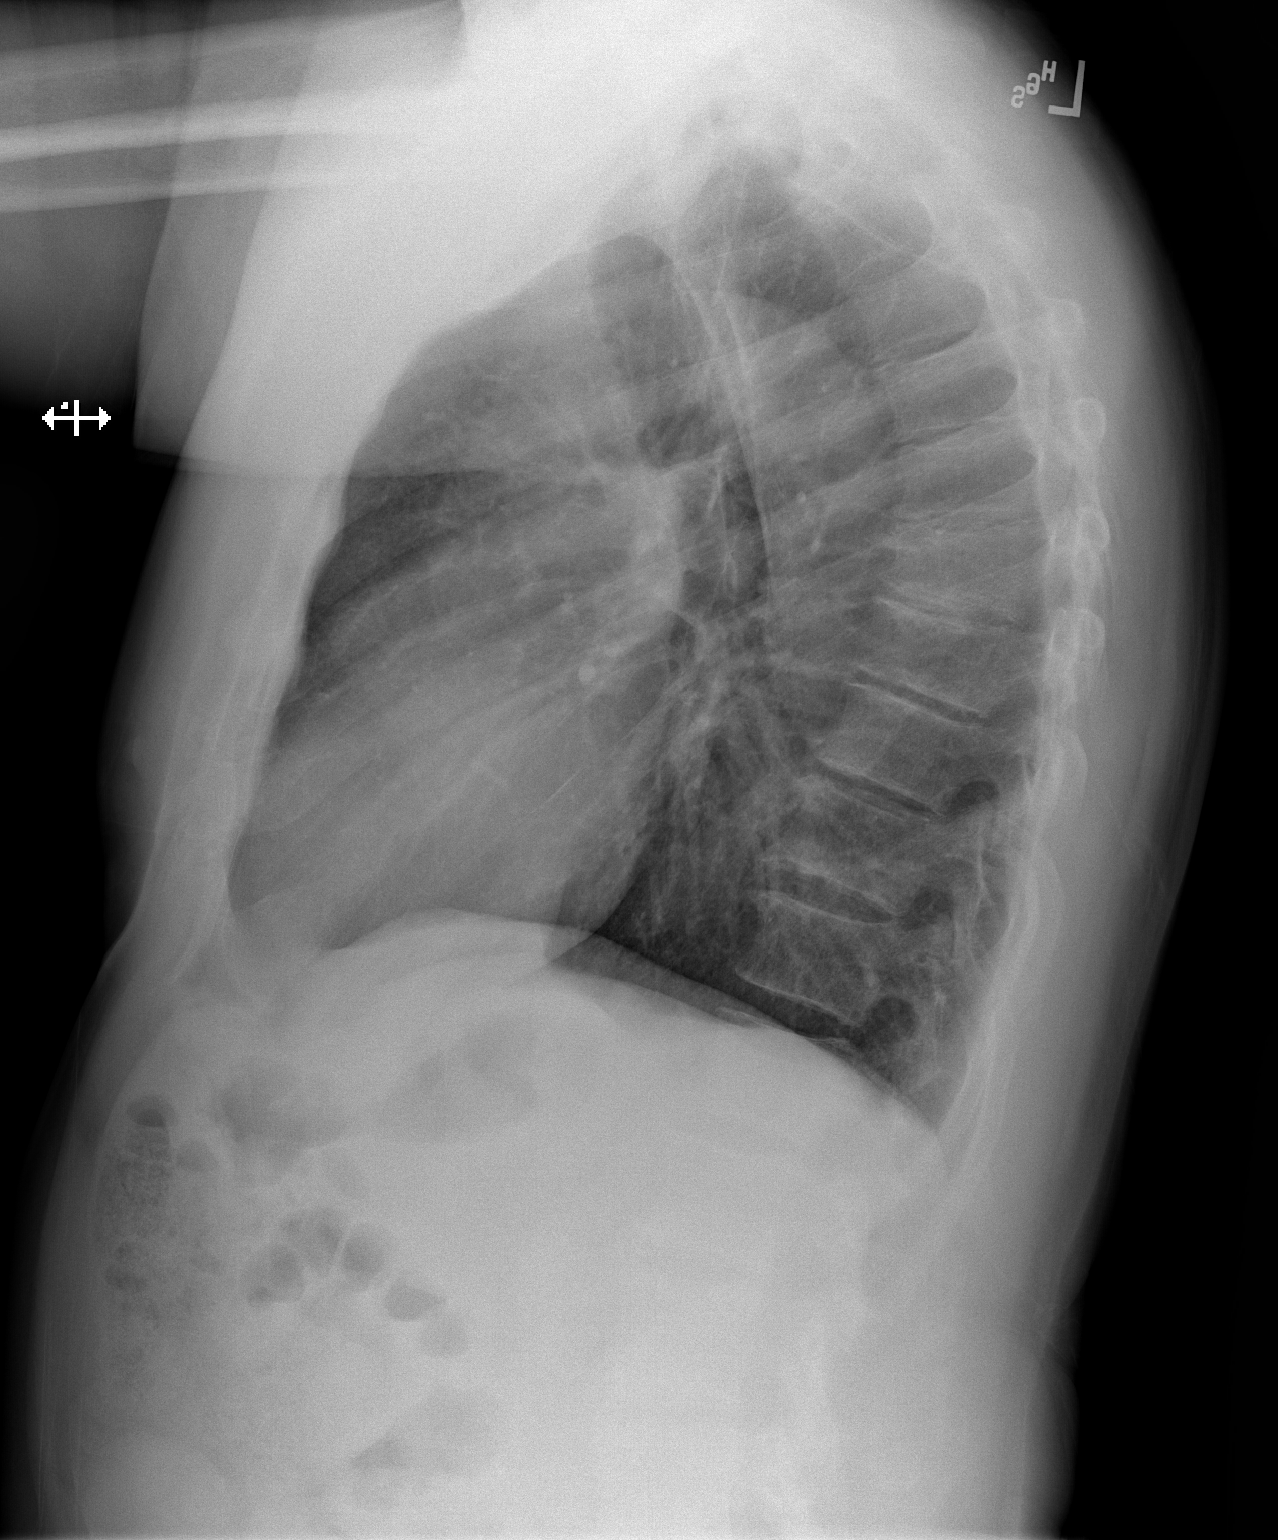

[2 of 2 positions shown; findings below may reference images not displayed]

FINDINGS: The heart size and mediastinal contours are within normal limits.
Both lungs are clear. The visualized skeletal structures are
unremarkable.
IMPRESSION: No active cardiopulmonary disease.

## 2017-03-29 DIAGNOSIS — M1711 Unilateral primary osteoarthritis, right knee: Secondary | ICD-10-CM | POA: Diagnosis not present

## 2017-03-29 DIAGNOSIS — M25562 Pain in left knee: Secondary | ICD-10-CM | POA: Diagnosis not present

## 2017-03-29 DIAGNOSIS — M25561 Pain in right knee: Secondary | ICD-10-CM | POA: Diagnosis not present

## 2017-03-29 DIAGNOSIS — M1712 Unilateral primary osteoarthritis, left knee: Secondary | ICD-10-CM | POA: Diagnosis not present

## 2017-04-25 ENCOUNTER — Other Ambulatory Visit: Payer: Self-pay | Admitting: Family Medicine

## 2017-05-19 ENCOUNTER — Encounter: Payer: Self-pay | Admitting: Family Medicine

## 2017-05-19 ENCOUNTER — Ambulatory Visit: Payer: Federal, State, Local not specified - PPO | Admitting: Family Medicine

## 2017-05-19 ENCOUNTER — Other Ambulatory Visit: Payer: Self-pay

## 2017-05-19 VITALS — BP 120/81 | HR 70 | Temp 98.1°F | Resp 16 | Ht 65.0 in | Wt 165.1 lb

## 2017-05-19 DIAGNOSIS — C61 Malignant neoplasm of prostate: Secondary | ICD-10-CM

## 2017-05-19 DIAGNOSIS — E785 Hyperlipidemia, unspecified: Secondary | ICD-10-CM

## 2017-05-19 DIAGNOSIS — I10 Essential (primary) hypertension: Secondary | ICD-10-CM

## 2017-05-19 LAB — HEPATIC FUNCTION PANEL
ALT: 23 U/L (ref 0–53)
AST: 21 U/L (ref 0–37)
Albumin: 4.5 g/dL (ref 3.5–5.2)
Alkaline Phosphatase: 42 U/L (ref 39–117)
BILIRUBIN DIRECT: 0.2 mg/dL (ref 0.0–0.3)
BILIRUBIN TOTAL: 0.8 mg/dL (ref 0.2–1.2)
Total Protein: 7.4 g/dL (ref 6.0–8.3)

## 2017-05-19 LAB — CBC WITH DIFFERENTIAL/PLATELET
BASOS PCT: 0.9 % (ref 0.0–3.0)
Basophils Absolute: 0 10*3/uL (ref 0.0–0.1)
EOS PCT: 0.4 % (ref 0.0–5.0)
Eosinophils Absolute: 0 10*3/uL (ref 0.0–0.7)
HEMATOCRIT: 40.5 % (ref 39.0–52.0)
Hemoglobin: 13.9 g/dL (ref 13.0–17.0)
LYMPHS ABS: 0.8 10*3/uL (ref 0.7–4.0)
LYMPHS PCT: 17.8 % (ref 12.0–46.0)
MCHC: 34.5 g/dL (ref 30.0–36.0)
MCV: 95.5 fl (ref 78.0–100.0)
MONOS PCT: 6.7 % (ref 3.0–12.0)
Monocytes Absolute: 0.3 10*3/uL (ref 0.1–1.0)
NEUTROS ABS: 3.5 10*3/uL (ref 1.4–7.7)
Neutrophils Relative %: 74.2 % (ref 43.0–77.0)
PLATELETS: 249 10*3/uL (ref 150.0–400.0)
RBC: 4.24 Mil/uL (ref 4.22–5.81)
RDW: 13.8 % (ref 11.5–15.5)
WBC: 4.7 10*3/uL (ref 4.0–10.5)

## 2017-05-19 LAB — BASIC METABOLIC PANEL
BUN: 17 mg/dL (ref 6–23)
CALCIUM: 9.5 mg/dL (ref 8.4–10.5)
CO2: 27 mEq/L (ref 19–32)
CREATININE: 1.11 mg/dL (ref 0.40–1.50)
Chloride: 101 mEq/L (ref 96–112)
GFR: 70.94 mL/min (ref >60.00)
Glucose, Bld: 96 mg/dL (ref 70–99)
Potassium: 4.3 mEq/L (ref 3.5–5.1)
Sodium: 137 mEq/L (ref 135–145)

## 2017-05-19 LAB — LIPID PANEL
Cholesterol: 146 mg/dL (ref 0–200)
HDL: 50.1 mg/dL (ref >39.00)
LDL Cholesterol: 77 mg/dL (ref 0–99)
NONHDL: 95.51
Total CHOL/HDL Ratio: 3
Triglycerides: 94 mg/dL (ref 0.0–149.0)
VLDL: 18.8 mg/dL (ref 0.0–40.0)

## 2017-05-19 LAB — TSH: TSH: 1.43 u[IU]/mL (ref 0.35–4.50)

## 2017-05-19 MED ORDER — METOPROLOL SUCCINATE ER 50 MG PO TB24: 50.0000 mg | ORAL_TABLET | Freq: Every day | ORAL | 1 refills | Status: DC

## 2017-05-19 NOTE — Assessment & Plan Note (Signed)
Chronic problem.  Well controlled.  Pt would like to switch to a different beta blocker due to cost of Bystolic.  He is to check BP and MyChart me 3-4 weeks after switching medication to make sure new medication is working.  Check labs.  Will follow closely.

## 2017-05-19 NOTE — Assessment & Plan Note (Signed)
Chronic problem.  Tolerating statin w/o difficulty.  Check labs.  Adjust meds prn  

## 2017-05-19 NOTE — Assessment & Plan Note (Signed)
Continues to follow w/ Urology

## 2017-05-19 NOTE — Progress Notes (Signed)
   Subjective:    Patient ID: [name], male    DOB: 12-Aug-1955, 62 y.o.   MRN: [id number]  HPI HTN- chronic problem, on Bystolic 10mg  Matzim LA 360mg  daily, Lisinopril 40mg  daily w/ good control.  No CP, SOB, HAs, visual changes, edema.  Pt is asking to switch Bystolic to cheaper medication.  Hyperlipidemia- chronic problem, on Lipitor 20mg  daily w/ hx of good control.  Pt is running 4 miles/day on treadmill.  No abd pain, N/V, myalgias.   Review of Systems For ROS see HPI     Objective:   Physical Exam  Constitutional: He is oriented to person, place, and time. He appears well-developed and well-nourished. No distress.  HENT:  Head: Normocephalic and atraumatic.  Eyes: Pupils are equal, round, and reactive to light. Conjunctivae and EOM are normal.  Neck: Normal range of motion. Neck supple. No thyromegaly present.  Cardiovascular: Normal rate, regular rhythm, normal heart sounds and intact distal pulses.  No murmur heard. Pulmonary/Chest: Effort normal and breath sounds normal. No respiratory distress.  Abdominal: Soft. Bowel sounds are normal. He exhibits no distension.  Musculoskeletal: He exhibits no edema.  Lymphadenopathy:    He has no cervical adenopathy.  Neurological: He is alert and oriented to person, place, and time. No cranial nerve deficit.  Skin: Skin is warm and dry.  Psychiatric: He has a normal mood and affect. His behavior is normal.  Vitals reviewed.         Assessment & Plan:

## 2017-05-19 NOTE — Patient Instructions (Signed)
Schedule your complete physical in 6 months We'll notify you of your lab results and make any changes if needed Keep up the good work on healthy diet and regular exercise- you look great! STOP the Bystolic when you get back START the Metoprolol once daily when you get back from your trip Call with any questions or concerns Have a great summer!!!

## 2017-05-20 ENCOUNTER — Encounter: Payer: Self-pay | Admitting: General Practice

## 2017-06-27 DIAGNOSIS — M1711 Unilateral primary osteoarthritis, right knee: Secondary | ICD-10-CM | POA: Diagnosis not present

## 2017-06-27 DIAGNOSIS — M1712 Unilateral primary osteoarthritis, left knee: Secondary | ICD-10-CM | POA: Diagnosis not present

## 2017-07-01 ENCOUNTER — Telehealth: Payer: Self-pay | Admitting: Family Medicine

## 2017-07-01 ENCOUNTER — Other Ambulatory Visit: Payer: Self-pay | Admitting: Family Medicine

## 2017-07-01 NOTE — Telephone Encounter (Signed)
Copied from [name] [id number]. Topic: Quick Communication - Rx Refill/Question >> Jul 01, 2017 10:45 AM Oliver Pila B wrote: Medication: lisinopril (PRINIVIL,ZESTRIL) 40 MG tablet [[id number]]   Has the patient contacted their pharmacy? Yes.   (Agent: If no, request that the patient contact the pharmacy for the refill.) (Agent: If yes, when and what did the pharmacy advise?)  Preferred Pharmacy (with phone number or street name): [facility]  Agent: Please be advised that RX refills may take up to 3 business days. We ask that you follow-up with your pharmacy.

## 2017-07-02 NOTE — Telephone Encounter (Signed)
Already filled in another encounter

## 2017-07-22 ENCOUNTER — Ambulatory Visit: Payer: Federal, State, Local not specified - PPO | Admitting: Physician Assistant

## 2017-07-22 ENCOUNTER — Other Ambulatory Visit: Payer: Self-pay

## 2017-07-22 ENCOUNTER — Encounter: Payer: Self-pay | Admitting: Physician Assistant

## 2017-07-22 VITALS — BP 138/88 | HR 70 | Temp 98.2°F | Resp 14 | Ht 65.0 in | Wt 178.0 lb

## 2017-07-22 DIAGNOSIS — B9689 Other specified bacterial agents as the cause of diseases classified elsewhere: Secondary | ICD-10-CM | POA: Diagnosis not present

## 2017-07-22 DIAGNOSIS — J208 Acute bronchitis due to other specified organisms: Secondary | ICD-10-CM | POA: Diagnosis not present

## 2017-07-22 MED ORDER — AZITHROMYCIN 250 MG PO TABS: ORAL_TABLET | ORAL | 0 refills | Status: DC

## 2017-07-22 MED ORDER — BENZONATATE 100 MG PO CAPS: 100.0000 mg | ORAL_CAPSULE | Freq: Two times a day (BID) | ORAL | 0 refills | Status: DC | PRN

## 2017-07-22 NOTE — Progress Notes (Signed)
Subjective:     [name] is a 62 y.o. male who presents for evaluation of symptoms of a URI. Symptoms include congestion, nasal congestion, no  fever, post nasal drip, productive cough with  green colored sputum and sore throat. Onset of symptoms was 10 days ago, and has been gradually worsening since that time. Treatment to date: cough suppressants and decongestants.  The following portions of the patient's history were reviewed and updated as appropriate: allergies, current medications, past family history, past medical history, past social history, past surgical history and problem list.  Review of Systems Pertinent ROS are listed in the HPI.  Objective:    BP 138/88   Pulse 70   Temp 98.2 F (36.8 C) (Oral)   Resp 14   Ht 5\' 5"  (1.651 m)   Wt 178 lb (80.7 kg)   SpO2 98%   BMI 29.62 kg/m  General appearance: alert, cooperative, appears stated age and no distress Head: Normocephalic, without obvious abnormality, atraumatic Ears: normal TM's and external ear canals both ears Nose: Nares normal. Septum midline. Mucosa normal. No drainage or sinus tenderness. Throat: lips, mucosa, and tongue normal; teeth and gums normal Lungs: clear to auscultation bilaterally Heart: regular rate and rhythm, S1, S2 normal, no murmur, click, rub or gallop Lymph nodes: Cervical, supraclavicular, and axillary nodes normal.   Assessment:   Acute bacterial bronchitis  Plan:   Start Azithromycin. Tessalon per orders. Supportive measures and OTC medications reviewed. Follow-up if symptoms are not resolving.

## 2017-07-22 NOTE — Patient Instructions (Signed)
Take antibiotic (Azithromycin) as directed.  Increase fluids.  Get plenty of rest. Use Mucinex for congestion. Use the Tessalon to help with cough. Take a daily probiotic (I recommend Align or Culturelle, but even Activia Yogurt may be beneficial).  A humidifier placed in the bedroom may offer some relief for a dry, scratchy throat of nasal irritation.  Read information below on acute bronchitis. Please call or return to clinic if symptoms are not improving.  Acute Bronchitis Bronchitis is when the airways that extend from the windpipe into the lungs get red, puffy, and painful (inflamed). Bronchitis often causes thick spit (mucus) to develop. This leads to a cough. A cough is the most common symptom of bronchitis. In acute bronchitis, the condition usually begins suddenly and goes away over time (usually in 2 weeks). Smoking, allergies, and asthma can make bronchitis worse. Repeated episodes of bronchitis may cause more lung problems.  HOME CARE  Rest.  Drink enough fluids to keep your pee (urine) clear or pale yellow (unless you need to limit fluids as told by your doctor).  Only take over-the-counter or prescription medicines as told by your doctor.  Avoid smoking and secondhand smoke. These can make bronchitis worse. If you are a smoker, think about using nicotine gum or skin patches. Quitting smoking will help your lungs heal faster.  Reduce the chance of getting bronchitis again by:  Washing your hands often.  Avoiding people with cold symptoms.  Trying not to touch your hands to your mouth, nose, or eyes.  Follow up with your doctor as told.  GET HELP IF: Your symptoms do not improve after 1 week of treatment. Symptoms include:  Cough.  Fever.  Coughing up thick spit.  Body aches.  Chest congestion.  Chills.  Shortness of breath.  Sore throat.  GET HELP RIGHT AWAY IF:   You have an increased fever.  You have chills.  You have severe shortness of  breath.  You have bloody thick spit (sputum).  You throw up (vomit) often.  You lose too much body fluid (dehydration).  You have a severe headache.  You faint.  MAKE SURE YOU:   Understand these instructions.  Will watch your condition.  Will get help right away if you are not doing well or get worse. Document Released: 06/09/2007 Document Revised: 08/23/2012 Document Reviewed: 06/13/2012 [facility] Patient Information 2015 [place], [place]. This information is not intended to replace advice given to you by your health care provider. Make sure you discuss any questions you have with your health care provider.

## 2017-07-26 ENCOUNTER — Other Ambulatory Visit: Payer: Self-pay | Admitting: Family Medicine

## 2017-09-01 ENCOUNTER — Encounter: Payer: Self-pay | Admitting: Family Medicine

## 2017-10-18 ENCOUNTER — Other Ambulatory Visit: Payer: Self-pay | Admitting: Family Medicine

## 2017-11-23 ENCOUNTER — Encounter: Payer: Self-pay | Admitting: Family Medicine

## 2017-11-23 ENCOUNTER — Ambulatory Visit (INDEPENDENT_AMBULATORY_CARE_PROVIDER_SITE_OTHER): Payer: Federal, State, Local not specified - PPO | Admitting: Family Medicine

## 2017-11-23 ENCOUNTER — Other Ambulatory Visit: Payer: Self-pay

## 2017-11-23 VITALS — BP 136/81 | HR 74 | Temp 98.1°F | Resp 16 | Ht 65.0 in | Wt 167.2 lb

## 2017-11-23 DIAGNOSIS — Z0000 Encounter for general adult medical examination without abnormal findings: Secondary | ICD-10-CM | POA: Diagnosis not present

## 2017-11-23 DIAGNOSIS — I10 Essential (primary) hypertension: Secondary | ICD-10-CM | POA: Diagnosis not present

## 2017-11-23 LAB — CBC WITH DIFFERENTIAL/PLATELET
Basophils Absolute: 0 10*3/uL (ref 0.0–0.1)
Basophils Relative: 0.5 % (ref 0.0–3.0)
EOS PCT: 0.2 % (ref 0.0–5.0)
Eosinophils Absolute: 0 10*3/uL (ref 0.0–0.7)
HCT: 40.7 % (ref 39.0–52.0)
HEMOGLOBIN: 14.3 g/dL (ref 13.0–17.0)
LYMPHS ABS: 1 10*3/uL (ref 0.7–4.0)
Lymphocytes Relative: 16.9 % (ref 12.0–46.0)
MCHC: 35.2 g/dL (ref 30.0–36.0)
MCV: 94.4 fl (ref 78.0–100.0)
MONOS PCT: 5.9 % (ref 3.0–12.0)
Monocytes Absolute: 0.3 10*3/uL (ref 0.1–1.0)
NEUTROS PCT: 76.5 % (ref 43.0–77.0)
Neutro Abs: 4.5 10*3/uL (ref 1.4–7.7)
Platelets: 220 10*3/uL (ref 150.0–400.0)
RBC: 4.31 Mil/uL (ref 4.22–5.81)
RDW: 13.3 % (ref 11.5–15.5)
WBC: 5.8 10*3/uL (ref 4.0–10.5)

## 2017-11-23 LAB — BASIC METABOLIC PANEL
BUN: 17 mg/dL (ref 6–23)
CALCIUM: 9.5 mg/dL (ref 8.4–10.5)
CO2: 24 mEq/L (ref 19–32)
CREATININE: 1.1 mg/dL (ref 0.40–1.50)
Chloride: 99 mEq/L (ref 96–112)
GFR: 71.57 mL/min (ref >60.00)
GLUCOSE: 95 mg/dL (ref 70–99)
Potassium: 4 mEq/L (ref 3.5–5.1)
Sodium: 134 mEq/L — ABNORMAL LOW (ref 135–145)

## 2017-11-23 LAB — HEPATIC FUNCTION PANEL
ALK PHOS: 48 U/L (ref 39–117)
ALT: 29 U/L (ref 0–53)
AST: 30 U/L (ref 0–37)
Albumin: 4.6 g/dL (ref 3.5–5.2)
BILIRUBIN TOTAL: 0.9 mg/dL (ref 0.2–1.2)
Bilirubin, Direct: 0.2 mg/dL (ref 0.0–0.3)
Total Protein: 7.5 g/dL (ref 6.0–8.3)

## 2017-11-23 LAB — LIPID PANEL
Cholesterol: 121 mg/dL (ref 0–200)
HDL: 48 mg/dL (ref >39.00)
LDL Cholesterol: 61 mg/dL (ref 0–99)
NONHDL: 72.54
Total CHOL/HDL Ratio: 3
Triglycerides: 58 mg/dL (ref 0.0–149.0)
VLDL: 11.6 mg/dL (ref 0.0–40.0)

## 2017-11-23 LAB — TSH: TSH: 0.76 u[IU]/mL (ref 0.35–4.50)

## 2017-11-23 MED ORDER — LISINOPRIL 40 MG PO TABS: 40.0000 mg | ORAL_TABLET | Freq: Every day | ORAL | 1 refills | Status: DC

## 2017-11-23 MED ORDER — METOPROLOL SUCCINATE ER 50 MG PO TB24: 50.0000 mg | ORAL_TABLET | Freq: Every day | ORAL | 1 refills | Status: DC

## 2017-11-23 NOTE — Assessment & Plan Note (Signed)
Pt's PE WNL.  UTD on colonoscopy, immunizations, urology.  Check labs.  Anticipatory guidance provided.  ?

## 2017-11-23 NOTE — Assessment & Plan Note (Signed)
Chronic problem.  Adequate control today.  Asymptomatic.  Check labs.  No anticipated med changes.  Will follow. 

## 2017-11-23 NOTE — Progress Notes (Signed)
   Subjective:    Patient ID: [name], male    DOB: [birth year], 62 y.o.   MRN: [id number]  HPI CPE- UTD on colonoscopy, immunizations, urology.   Review of Systems Patient reports no vision/hearing changes, anorexia, fever ,adenopathy, persistant/recurrent hoarseness, swallowing issues, chest pain, palpitations, edema, persistant/recurrent cough, hemoptysis, dyspnea (rest,exertional, paroxysmal nocturnal), gastrointestinal  bleeding (melena, rectal bleeding), abdominal pain, excessive heart burn, GU symptoms (dysuria, hematuria, voiding/incontinence issues) syncope, focal weakness, memory loss, numbness & tingling, skin/hair/nail changes, depression, anxiety, abnormal bruising/bleeding, musculoskeletal symptoms/signs.     Objective:   Physical Exam General Appearance:    Alert, cooperative, no distress, appears stated age  Head:    Normocephalic, without obvious abnormality, atraumatic  Eyes:    PERRL, conjunctiva/corneas clear, EOM's intact, fundi    benign, both eyes       Ears:    Normal TM's and external ear canals, both ears  Nose:   Nares normal, septum midline, mucosa normal, no drainage   or sinus tenderness  Throat:   Lips, mucosa, and tongue normal; teeth and gums normal  Neck:   Supple, symmetrical, trachea midline, no adenopathy;       thyroid:  No enlargement/tenderness/nodules  Back:     Symmetric, no curvature, ROM normal, no CVA tenderness  Lungs:     Clear to auscultation bilaterally, respirations unlabored  Chest wall:    No tenderness or deformity  Heart:    Regular rate and rhythm, S1 and S2 normal, no murmur, rub   or gallop  Abdomen:     Soft, non-tender, bowel sounds active all four quadrants,    no masses, no organomegaly  Genitalia:    Deferred to urology  Rectal:    Extremities:   Extremities normal, atraumatic, no cyanosis or edema  Pulses:   2+ and symmetric all extremities  Skin:   Skin color, texture, turgor normal, no rashes or lesions  Lymph  nodes:   Cervical, supraclavicular, and axillary nodes normal  Neurologic:   CNII-XII intact. Normal strength, sensation and reflexes      throughout          Assessment & Plan:

## 2017-11-23 NOTE — Patient Instructions (Signed)
Follow up in 6 months to recheck BP and cholesterol We'll notify you of your lab results and make any changes if needed Continue to work on healthy diet and regular exercise- you look great! Call with any questions or concerns Happy Holidays!!!

## 2017-11-24 ENCOUNTER — Encounter: Payer: Self-pay | Admitting: General Practice

## 2018-01-12 DIAGNOSIS — N3041 Irradiation cystitis with hematuria: Secondary | ICD-10-CM | POA: Diagnosis not present

## 2018-01-12 DIAGNOSIS — D400 Neoplasm of uncertain behavior of prostate: Secondary | ICD-10-CM | POA: Diagnosis not present

## 2018-01-12 DIAGNOSIS — Z125 Encounter for screening for malignant neoplasm of prostate: Secondary | ICD-10-CM | POA: Diagnosis not present

## 2018-01-12 DIAGNOSIS — C61 Malignant neoplasm of prostate: Secondary | ICD-10-CM | POA: Diagnosis not present

## 2018-01-12 DIAGNOSIS — N5201 Erectile dysfunction due to arterial insufficiency: Secondary | ICD-10-CM | POA: Diagnosis not present

## 2018-01-23 DIAGNOSIS — R338 Other retention of urine: Secondary | ICD-10-CM | POA: Diagnosis not present

## 2018-01-23 DIAGNOSIS — C61 Malignant neoplasm of prostate: Secondary | ICD-10-CM | POA: Diagnosis not present

## 2018-01-23 DIAGNOSIS — D400 Neoplasm of uncertain behavior of prostate: Secondary | ICD-10-CM | POA: Diagnosis not present

## 2018-01-23 DIAGNOSIS — N35911 Unspecified urethral stricture, male, meatal: Secondary | ICD-10-CM | POA: Diagnosis not present

## 2018-01-23 NOTE — H&P (Signed)
NAMECHARLES, [name] MEDICAL RECORD [id number] ACCOUNT [id number] DATE OF BIRTH:Nov 01, 1955 FACILITY: [facility] LOCATION: [facility] PHYSICIAN:[name] Alfonso Patten WOLFF, MD  HISTORY AND PHYSICAL  DATE OF ADMISSION:  01/23/2018  CHIEF COMPLAINT:  Inability to urinate.  HISTORY OF PRESENT ILLNESS:  The patient is a 63 year old male who presented to the office 01/23/2018 with inability to void and inability to pass a self-catheter.  Cystoscopy in the office revealed a stricture at the apex of the prostate.  The patient  comes in now for cystoscopy with internal urethrotomy using holmium laser and possible suprapubic catheter placement.  The patient has a complex urological history dating back to his diagnosis of prostate cancer.  He was diagnosed with stage T1c, Gleason grade 3+4 adenocarcinoma of the prostate involving of four of twelve cores and shows radiation therapy.  He underwent  external beam radiation therapy combined with I-125 brachytherapy and androgen blockade in 2013.  He has postradiation course has been complicated by episodes of urinary retention and radiation cystitis.  He has required hyperbaric oxygen treatments for  the radiation cystitis.  PAST MEDICAL HISTORY:    ALLERGIES:  No drug allergies.  CURRENT MEDICATIONS:  Included metoprolol, atorvastatin, Elmiron, tamsulosin and Bladder 2.2.  PAST SURGICAL HISTORY:  Bilateral vasectomy in 1991, cystoscopy with fulguration in 2017.  PAST AND CURRENT MEDICAL CONDITIONS: 1.  Prostate cancer. 2.  Radiation cystitis. 3.  Hypertension. 4.  Hypercholesterolemia.  REVIEW OF SYSTEMS:  The patient denies chest pain, shortness of breath, diabetes or stroke.  PHYSICAL EXAMINATION: GENERAL:  A well-nourished, white male in no acute distress. HEENT:  Sclerae were clear.  Pupils are equally round, reactive to light and accommodation.  Extraocular movements are intact. NECK:  Supple, no palpable cervical adenopathy.  No audible carotid  bruits. PULMONARY:  Lungs clear to auscultation. CARDIOVASCULAR:  Regular rhythm and rate without audible murmurs. ABDOMEN:  Soft, nontender abdomen. GENITOURINARY:  Uncircumcised.  Testes were smooth, nontender. RECTAL:  Deferred. NEUROMUSCULAR:  Alert and oriented x3.  IMPRESSION: 1.  Urinary retention. 2.  Urethral stricture disease. 3.  History of radiation cystitis. 4.  Prostate cancer status post radiation therapy.  PLAN:  Cystoscopy with internal urethrotomy using holmium laser and possible suprapubic catheter placement.  TN/NUANCE  D:01/23/2018 T:01/23/2018 JOB:[id number]

## 2018-01-24 ENCOUNTER — Encounter
Admission: RE | Disposition: A | Discharge status: Home / Self Care | Payer: Self-pay | Source: Home / Self Care | Attending: Urology

## 2018-01-24 ENCOUNTER — Ambulatory Visit: Payer: Federal, State, Local not specified - PPO | Admitting: Anesthesiology

## 2018-01-24 ENCOUNTER — Encounter: Payer: Self-pay | Admitting: *Deleted

## 2018-01-24 ENCOUNTER — Other Ambulatory Visit: Payer: Self-pay

## 2018-01-24 ENCOUNTER — Ambulatory Visit
Admission: RE | Admit: 2018-01-24 | Discharge: 2018-01-24 | Disposition: A | Discharge status: Home / Self Care | Payer: Federal, State, Local not specified - PPO | Attending: Urology | Admitting: Urology

## 2018-01-24 DIAGNOSIS — Z8546 Personal history of malignant neoplasm of prostate: Secondary | ICD-10-CM | POA: Insufficient documentation

## 2018-01-24 DIAGNOSIS — Z87891 Personal history of nicotine dependence: Secondary | ICD-10-CM | POA: Diagnosis not present

## 2018-01-24 DIAGNOSIS — N35919 Unspecified urethral stricture, male, unspecified site: Secondary | ICD-10-CM | POA: Insufficient documentation

## 2018-01-24 DIAGNOSIS — Z79899 Other long term (current) drug therapy: Secondary | ICD-10-CM | POA: Insufficient documentation

## 2018-01-24 DIAGNOSIS — E7800 Pure hypercholesterolemia, unspecified: Secondary | ICD-10-CM | POA: Diagnosis not present

## 2018-01-24 DIAGNOSIS — R339 Retention of urine, unspecified: Secondary | ICD-10-CM | POA: Diagnosis not present

## 2018-01-24 DIAGNOSIS — N3040 Irradiation cystitis without hematuria: Secondary | ICD-10-CM | POA: Insufficient documentation

## 2018-01-24 DIAGNOSIS — Z923 Personal history of irradiation: Secondary | ICD-10-CM | POA: Diagnosis not present

## 2018-01-24 DIAGNOSIS — C61 Malignant neoplasm of prostate: Secondary | ICD-10-CM | POA: Diagnosis not present

## 2018-01-24 DIAGNOSIS — N320 Bladder-neck obstruction: Secondary | ICD-10-CM | POA: Insufficient documentation

## 2018-01-24 DIAGNOSIS — I10 Essential (primary) hypertension: Secondary | ICD-10-CM | POA: Insufficient documentation

## 2018-01-24 HISTORY — PX: HOLMIUM LASER APPLICATION: SHX5852

## 2018-01-24 HISTORY — PX: URETHROTOMY: SHX1083

## 2018-01-24 SURGERY — CYSTOSCOPY, WITH URETHROTOMY
Anesthesia: General

## 2018-01-24 MED ORDER — LEVOFLOXACIN IN D5W 500 MG/100ML IV SOLN
INTRAVENOUS | Status: AC
  Filled 2018-01-24: qty 100

## 2018-01-24 MED ORDER — PROPOFOL 10 MG/ML IV BOLUS
INTRAVENOUS | Status: DC | PRN
  Administered 2018-01-24: 150 mg via INTRAVENOUS

## 2018-01-24 MED ORDER — LABETALOL HCL 5 MG/ML IV SOLN
10.0000 mg | Freq: Once | INTRAVENOUS | Status: AC
  Administered 2018-01-24: 10 mg via INTRAVENOUS

## 2018-01-24 MED ORDER — FAMOTIDINE 20 MG PO TABS
ORAL_TABLET | ORAL | Status: AC
  Administered 2018-01-24: 20 mg via ORAL
  Filled 2018-01-24: qty 1

## 2018-01-24 MED ORDER — LACTATED RINGERS IV SOLN
INTRAVENOUS | Status: DC
  Administered 2018-01-24: 16:00:00 via INTRAVENOUS

## 2018-01-24 MED ORDER — LIDOCAINE HCL URETHRAL/MUCOSAL 2 % EX GEL
CUTANEOUS | Status: DC | PRN
  Administered 2018-01-24: 1

## 2018-01-24 MED ORDER — HYDROMORPHONE HCL 1 MG/ML IJ SOLN: 0.2500 mg | INTRAMUSCULAR | Status: DC | PRN

## 2018-01-24 MED ORDER — FAMOTIDINE 20 MG PO TABS
20.0000 mg | ORAL_TABLET | Freq: Once | ORAL | Status: AC
  Administered 2018-01-24: 20 mg via ORAL

## 2018-01-24 MED ORDER — LABETALOL HCL 5 MG/ML IV SOLN
INTRAVENOUS | Status: AC
  Administered 2018-01-24: 20 mg via INTRAVENOUS
  Filled 2018-01-24: qty 4

## 2018-01-24 MED ORDER — BELLADONNA ALKALOIDS-OPIUM 16.2-60 MG RE SUPP
RECTAL | Status: DC | PRN
  Administered 2018-01-24: 1 via RECTAL

## 2018-01-24 MED ORDER — LIDOCAINE HCL URETHRAL/MUCOSAL 2 % EX GEL
CUTANEOUS | Status: AC
  Filled 2018-01-24: qty 10

## 2018-01-24 MED ORDER — DEXAMETHASONE SODIUM PHOSPHATE 10 MG/ML IJ SOLN
INTRAMUSCULAR | Status: DC | PRN
  Administered 2018-01-24: 10 mg via INTRAVENOUS

## 2018-01-24 MED ORDER — LEVOFLOXACIN IN D5W 500 MG/100ML IV SOLN
500.0000 mg | Freq: Once | INTRAVENOUS | Status: AC
  Administered 2018-01-24: 500 mg via INTRAVENOUS

## 2018-01-24 MED ORDER — LIDOCAINE HCL (CARDIAC) PF 100 MG/5ML IV SOSY
PREFILLED_SYRINGE | INTRAVENOUS | Status: DC | PRN
  Administered 2018-01-24: 100 mg via INTRAVENOUS

## 2018-01-24 MED ORDER — MIDAZOLAM HCL 2 MG/2ML IJ SOLN
INTRAMUSCULAR | Status: AC
  Filled 2018-01-24: qty 2

## 2018-01-24 MED ORDER — MIDAZOLAM HCL 2 MG/2ML IJ SOLN
INTRAMUSCULAR | Status: DC | PRN
  Administered 2018-01-24: 2 mg via INTRAVENOUS

## 2018-01-24 MED ORDER — FENTANYL CITRATE (PF) 100 MCG/2ML IJ SOLN
INTRAMUSCULAR | Status: AC
  Filled 2018-01-24: qty 2

## 2018-01-24 MED ORDER — ONDANSETRON HCL 4 MG/2ML IJ SOLN
INTRAMUSCULAR | Status: DC | PRN
  Administered 2018-01-24: 4 mg via INTRAVENOUS

## 2018-01-24 MED ORDER — HYOSCYAMINE SULFATE SL 0.125 MG SL SUBL: 0.1250 mg | SUBLINGUAL_TABLET | SUBLINGUAL | 1 refills | Status: DC | PRN

## 2018-01-24 MED ORDER — LABETALOL HCL 5 MG/ML IV SOLN
INTRAVENOUS | Status: AC
  Filled 2018-01-24: qty 4

## 2018-01-24 MED ORDER — URIBEL 118 MG PO CAPS: 1.0000 | ORAL_CAPSULE | Freq: Four times a day (QID) | ORAL | 3 refills | Status: DC | PRN

## 2018-01-24 MED ORDER — LABETALOL HCL 5 MG/ML IV SOLN
20.0000 mg | Freq: Once | INTRAVENOUS | Status: AC
  Administered 2018-01-24: 20 mg via INTRAVENOUS

## 2018-01-24 MED ORDER — LEVOFLOXACIN 500 MG PO TABS: 500.0000 mg | ORAL_TABLET | Freq: Every day | ORAL | 1 refills | Status: DC

## 2018-01-24 MED ORDER — FENTANYL CITRATE (PF) 100 MCG/2ML IJ SOLN
INTRAMUSCULAR | Status: DC | PRN
  Administered 2018-01-24 (×4): 50 ug via INTRAVENOUS

## 2018-01-24 MED ORDER — BELLADONNA ALKALOIDS-OPIUM 16.2-60 MG RE SUPP
RECTAL | Status: AC
  Filled 2018-01-24: qty 1

## 2018-01-24 MED ORDER — EPHEDRINE SULFATE 50 MG/ML IJ SOLN
INTRAMUSCULAR | Status: DC | PRN
  Administered 2018-01-24: 10 mg via INTRAVENOUS

## 2018-01-24 SURGICAL SUPPLY — 32 items
BAG DRAIN CYSTO-URO LG1000N (MISCELLANEOUS) ×2 IMPLANT
BAG URINE DRAINAGE (UROLOGICAL SUPPLIES) ×2 IMPLANT
BRUSH SCRUB EZ 1% IODOPHOR (MISCELLANEOUS) ×2 IMPLANT
CATH FOLEY 2WAY  5CC 20FR SIL (CATHETERS) ×1
CATH FOLEY 2WAY 5CC 20FR SIL (CATHETERS) ×1 IMPLANT
CATH URETL 5X70 OPEN END (CATHETERS) ×2 IMPLANT
CNTNR SPEC 2.5X3XGRAD LEK (MISCELLANEOUS)
CONT SPEC 4OZ STER OR WHT (MISCELLANEOUS)
CONTAINER SPEC 2.5X3XGRAD LEK (MISCELLANEOUS) IMPLANT
FIBER LASER 365 (Laser) IMPLANT
FIBER LASER 550 (Laser) ×1 IMPLANT
GLOVE BIO SURGEON STRL SZ7 (GLOVE) ×4 IMPLANT
GLOVE BIO SURGEON STRL SZ7.5 (GLOVE) ×2 IMPLANT
GLOVE BIOGEL PI IND STRL 7.0 (GLOVE) ×1 IMPLANT
GLOVE BIOGEL PI INDICATOR 7.0 (GLOVE) ×1
GOWN STRL REUS W/ TWL LRG LVL3 (GOWN DISPOSABLE) ×1 IMPLANT
GOWN STRL REUS W/ TWL XL LVL3 (GOWN DISPOSABLE) ×1 IMPLANT
GOWN STRL REUS W/TWL LRG LVL3 (GOWN DISPOSABLE) ×1
GOWN STRL REUS W/TWL XL LVL3 (GOWN DISPOSABLE) ×1
GUIDEWIRE STR ZIPWIRE 035X150 (MISCELLANEOUS) ×2 IMPLANT
HOLDER FOLEY CATH W/STRAP (MISCELLANEOUS) ×1 IMPLANT
KIT TURNOVER CYSTO (KITS) ×2 IMPLANT
PACK CYSTO AR (MISCELLANEOUS) ×2 IMPLANT
SCALPEL LANCET BLADE (MISCELLANEOUS) ×1 IMPLANT
SET CYSTO W/LG BORE CLAMP LF (SET/KITS/TRAYS/PACK) ×2 IMPLANT
SOL .9 NS 3000ML IRR  AL (IV SOLUTION) ×2
SOL .9 NS 3000ML IRR UROMATIC (IV SOLUTION) ×1 IMPLANT
SURGILUBE 2OZ TUBE FLIPTOP (MISCELLANEOUS) ×2 IMPLANT
SYR 10ML LL (SYRINGE) ×2 IMPLANT
WATER STERILE IRR 1000ML POUR (IV SOLUTION) ×2 IMPLANT
WATER STERILE IRR 3000ML UROMA (IV SOLUTION) ×2 IMPLANT
WIRE G 035X145 STIFF STR (WIRE) ×1 IMPLANT

## 2018-01-24 NOTE — Anesthesia Procedure Notes (Signed)
Procedure Name: LMA Insertion Date/Time: 01/24/2018 4:25 PM Performed by: Nelda Marseille, CRNA Pre-anesthesia Checklist: Patient identified, Patient being monitored, Timeout performed, Emergency Drugs available and Suction available Patient Re-evaluated:Patient Re-evaluated prior to induction Oxygen Delivery Method: Circle system utilized Preoxygenation: Pre-oxygenation with 100% oxygen Induction Type: IV induction Ventilation: Mask ventilation without difficulty LMA: LMA inserted LMA Size: 4.5 Tube type: Oral Number of attempts: 1 Placement Confirmation: positive ETCO2 and breath sounds checked- equal and bilateral Tube secured with: Tape Dental Injury: Teeth and Oropharynx as per pre-operative assessment

## 2018-01-24 NOTE — Progress Notes (Signed)
Pt BP elevated. Dr. Ola Spurr notified. Acknowledged. Orders received. Alandria Butkiewicz E 6:04 PM 01/24/2018

## 2018-01-24 NOTE — Discharge Instructions (Addendum)
Urethral Stricture  Urethral stricture is narrowing of the tube (urethra) that carries urine from the bladder out of the body. The urethra can become narrow due to scar tissue from an injury or infection. This can make it difficult to pass urine. In women, the urethra opens above the vaginal opening. In men, the urethra opens at the tip of the penis, and the urethra is much longer than it is in women. Because of the length of the male urethra, urethral stricture is much more common in men. This condition is treated with surgery. What are the causes? Common causes of urethral stricture in men and women include:  Urinary tract infection (UTI).  Sexually transmitted infection (STI).  Use of a tube placed into the urethra to drain urine from the bladder (urinary catheter).  Urinary tract surgery. In men, common causes of urethral stricture include:  A severe injury to the pelvis.  Prostate surgery.  Injury to the penis. In many cases, the cause of urethral stricture may not be known. What increases the risk? Urethral stricture is more likely to develop in:  Men, especially men who have had prostate surgery.  People who use urinary catheters.  People who have had urinary tract surgery. What are the signs or symptoms? The most common symptom of this condition is difficulty passing urine. This may cause decreased urine flow, dribbling, or spraying of urine. Other symptoms may include:  Frequent UTIs.  Blood in the urine.  Pain when urinating.  Swelling of the penis in men.  Inability to pass urine (urinary obstruction). How is this diagnosed? This condition may be diagnosed based on:  Your medical history.  A physical exam.  Urine tests to check for infection or bleeding.  X-rays.  Ultrasound.  Retrograde urethrogram. This is a type of test in which dye is injected into the urethra and then an X-ray is taken.  Urethroscopy. This is when a thin tube with a light and  camera on the end (urethroscope) is used to look at the urethra. How is this treated? This condition is treated with surgery. The type of surgery that you have depends on the severity of your condition. You may have:  Urethral dilation. In this procedure, the narrow part of the urethra is stretched open (dilated) with dilating instruments or a small balloon.  Urethrotomy. In this procedure, a urethroscope is placed into the urethra, and the narrow part of the urethra is cut open with a surgical blade inserted through the urethroscope.  Open surgery. In this procedure, an incision is made in the urethra, the narrow part is removed, and the urethra is reconstructed. Follow these instructions at home:   Take over-the-counter and prescription medicines only as told by your health care provider.  If you were prescribed an antibiotic medicine, take it as told by your health care provider. Do not stop taking the antibiotic even if you start to feel better.  Drink enough fluid to keep your urine clear or pale yellow.  Keep all follow-up visits as told by your health care provider. This is important. Contact a health care provider if:  You have signs of a urinary tract infection, such as: ? Frequent urination or passing small amounts of urine frequently. ? Needing to urinate urgently. ? Pain or burning with urination. ? Urine that smells bad or unusual. ? Cloudy urine. ? Pain in the lower abdomen or back. ? Trouble urinating. ? Blood in the urine. ? Vomiting or being less hungry than  normal. ? Diarrhea or abdominal pain. ? Vaginal discharge, if you are male.  Your symptoms are getting worse instead of better. Get help right away if:  You cannot pass urine.  You have a fever.  You have swelling, bruising, or discoloration of your genital area. This includes the penis, scrotum, and inner thighs for men, and the outer genital organs (vulva) and inner thighs for women.  You develop  swelling in your legs.  You have difficulty breathing. This information is not intended to replace advice given to you by your health care provider. Make sure you discuss any questions you have with your health care provider. Document Released: 01/17/2015 Document Revised: 05/29/2015 Document Reviewed: 12/08/2014 Elsevier Interactive Patient Education  2019 [place]   1) The drugs that you were given will stay in your system until tomorrow so for the next 24 hours you should not:  A) Drive an automobile B) Make any legal decisions C) Drink any alcoholic beverage   2) You may resume regular meals tomorrow.  Today it is better to start with liquids and gradually work up to solid foods.  You may eat anything you prefer, but it is better to start with liquids, then soup and crackers, and gradually work up to solid foods.   3) Please notify your doctor immediately if you have any unusual bleeding, trouble breathing, redness and pain at the surgery site, drainage, fever, or pain not relieved by medication.    4) Additional Instructions:        Please contact your physician with any problems or Same Day Surgery at [phone], Monday through Friday 6 am to 4 pm, or [place] at [facility] number at [phone].

## 2018-01-24 NOTE — Progress Notes (Signed)
BP still elevated after 10mg  Labetalol. Dr. Ola Spurr notified. Acknowledged. Orders received. Mende Biswell E 6:26 PM 01/24/2018

## 2018-01-24 NOTE — H&P (Signed)
Date of Initial H&P: 01/23/18  History reviewed, patient examined, no change in status, stable for surgery.

## 2018-01-24 NOTE — Transfer of Care (Signed)
Immediate Anesthesia Transfer of Care Note  Patient: [name]  Procedure(s) Performed: CYSTOSCOPY- INTERNAL URETHROTOMY WITH HOLMIUM LASER (N/A ) HOLMIUM LASER APPLICATION (N/A )  Patient Location: PACU  Anesthesia Type:General  Level of Consciousness: sedated  Airway & Oxygen Therapy: Patient Spontanous Breathing and Patient connected to face mask oxygen  Post-op Assessment: Report given to RN and Post -op Vital signs reviewed and stable  Post vital signs: Reviewed and stable  Last Vitals:  Vitals Value Taken Time  BP    Temp    Pulse 68 01/24/2018  5:04 PM  Resp 10 01/24/2018  5:04 PM  SpO2 100 % 01/24/2018  5:04 PM  Vitals shown include unvalidated device data.  Last Pain:  Vitals:   01/24/18 1534  TempSrc: Temporal  PainSc: 6          Complications: No apparent anesthesia complications

## 2018-01-24 NOTE — Anesthesia Post-op Follow-up Note (Signed)
Anesthesia QCDR form completed.        

## 2018-01-24 NOTE — Op Note (Signed)
Preoperative diagnosis: Bladder neck contracture  Postoperative diagnosis: Same  Procedure: 1.Transurethral incision of bladder neck contracture with Holmium                                 Laser                      2.  Foley catheter placement                       Surgeon: Otelia Limes. Yves Dill MD  Anesthesia: General  Indications:See the history and physical. After informed consent the above procedure(s) were requested     Technique and findings: After adequate general anesthesia been obtained the patient was placed into dorsal lithotomy position and the perineum was prepped and draped in the usual fashion.  41 French cystoscope was coupled with the camera and visually advanced into the urethra.  Stricture was encountered at the bladder neck scope could not be passed beyond this point.  0.035 Glidewire was then passed through the stricture orifice and curled into the bladder.  An open-ended ureteral catheter was passed over the guidewire and the guidewire was removed.  The Glidewire was then exchanged for a PTFE guidewire.  The 550 m holmium laser fiber was introduced through the scope and bladder neck incised at the 3 o'clock position.  The bladder neck was widely patent now.  The guidewire and cystoscope were removed.  10 cc of viscous Xylocaine was instilled within the urethra.  A 20 French silicone catheter was placed.  A B&O suppository was placed.  Procedure was then terminated and patient transferred to the recovery room in stable condition.

## 2018-01-24 NOTE — Anesthesia Preprocedure Evaluation (Signed)
Anesthesia Evaluation  Patient identified by MRN, date of birth, ID band Patient awake    Reviewed: Allergy & Precautions, H&P , NPO status , Patient's Chart, lab work & pertinent test results  Airway Mallampati: II  TM Distance: >3 FB     Dental  (+) Lower Dentures, Upper Dentures   Pulmonary neg pulmonary ROS, neg COPD, former smoker,           Cardiovascular hypertension, (-) Past MI negative cardio ROS  (-) dysrhythmias      Neuro/Psych negative neurological ROS  negative psych ROS   GI/Hepatic negative GI ROS, Neg liver ROS,   Endo/Other  negative endocrine ROS  Renal/GU      Musculoskeletal   Abdominal   Peds  Hematology negative hematology ROS (+)   Anesthesia Other Findings Past Medical History: No date: Cancer ([place]) No date: Hyperlipidemia No date: Hypertension  Past Surgical History: No date: APPENDECTOMY 11/04/2015: CYSTOSCOPY W/ RETROGRADES; Right     Comment:  Procedure: CYSTOSCOPY WITH RETROGRADE PYELOGRAM;                Surgeon: Royston Cowper, MD;  Location: [facility];                Service: Urology;  Laterality: Right; 08/19/2015: CYSTOSCOPY WITH FULGERATION; N/A     Comment:  Procedure: CYSTOSCOPY WITH FULGERATION / CLOT               EVACUATION;  Surgeon: Royston Cowper, MD;  Location:               [facility];  Service: Urology;  Laterality: N/A; 09/14/2015: CYSTOSCOPY WITH FULGERATION; N/A     Comment:  Procedure: CYSTOSCOPY WITH clot evacuation;  Surgeon:               Darcella Cheshire, MD;  Location: [facility];  Service: Urology;                Laterality: N/A; 09/23/2015: Consuela Mimes WITH FULGERATION; N/A     Comment:  Procedure: CYSTOSCOPY WITH FULGERATION;  Surgeon:               Royston Cowper, MD;  Location: [facility];  Service:               Urology;  Laterality: N/A; 09/23/2015: TRANSURETHRAL RESECTION OF PROSTATE; N/A     Comment:  Procedure: TRANSURETHRAL RESECTION OF THE PROSTATE              (TURP);  Surgeon: Royston Cowper, MD;  Location: [facility]               ORS;  Service: Urology;  Laterality: N/A; 11/04/2015: URETEROSCOPY; Right     Comment:  Procedure: URETEROSCOPY;  Surgeon: Royston Cowper, MD;               Location: [facility];  Service: Urology;  Laterality:               Right;  BMI    Body Mass Index:  29.12 kg/m      Reproductive/Obstetrics negative OB ROS                             Anesthesia Physical Anesthesia Plan  ASA: II  Anesthesia Plan: General LMA   Post-op Pain Management:    Induction:   PONV Risk Score and Plan: Dexamethasone, Ondansetron, Midazolam and Treatment may vary due  to age or medical condition  Airway Management Planned:   Additional Equipment:   Intra-op Plan:   Post-operative Plan:   Informed Consent: I have reviewed the patients History and Physical, chart, labs and discussed the procedure including the risks, benefits and alternatives for the proposed anesthesia with the patient or authorized representative who has indicated his/her understanding and acceptance.     Dental Advisory Given  Plan Discussed with: Anesthesiologist and CRNA  Anesthesia Plan Comments:         Anesthesia Quick Evaluation

## 2018-01-25 ENCOUNTER — Encounter: Payer: Self-pay | Admitting: Urology

## 2018-01-26 NOTE — Anesthesia Postprocedure Evaluation (Signed)
Anesthesia Post Note  Patient: [name]  Procedure(s) Performed: CYSTOSCOPY- INTERNAL URETHROTOMY WITH HOLMIUM LASER (N/A ) HOLMIUM LASER APPLICATION (N/A )  Patient location during evaluation: PACU Anesthesia Type: General Level of consciousness: awake and alert Pain management: pain level controlled Vital Signs Assessment: post-procedure vital signs reviewed and stable Respiratory status: spontaneous breathing, nonlabored ventilation and respiratory function stable Cardiovascular status: blood pressure returned to baseline and stable Postop Assessment: no apparent nausea or vomiting Anesthetic complications: no   Received 2 doses of IV labetalol for hypertension in post op.  Last Vitals:  Vitals:   01/24/18 1819 01/24/18 1840  BP: (!) 200/94 (!) 187/89  Pulse: 83   Resp:  16  Temp:    SpO2:  100%    Last Pain:  Vitals:   01/25/18 0919  TempSrc:   PainSc: 0-No pain                 Durenda Hurt

## 2018-02-10 DIAGNOSIS — D400 Neoplasm of uncertain behavior of prostate: Secondary | ICD-10-CM | POA: Diagnosis not present

## 2018-02-10 DIAGNOSIS — C61 Malignant neoplasm of prostate: Secondary | ICD-10-CM | POA: Diagnosis not present

## 2018-02-10 DIAGNOSIS — N3041 Irradiation cystitis with hematuria: Secondary | ICD-10-CM | POA: Diagnosis not present

## 2018-02-20 DIAGNOSIS — R350 Frequency of micturition: Secondary | ICD-10-CM | POA: Diagnosis not present

## 2018-02-20 DIAGNOSIS — R3915 Urgency of urination: Secondary | ICD-10-CM | POA: Diagnosis not present

## 2018-02-21 DIAGNOSIS — R3915 Urgency of urination: Secondary | ICD-10-CM | POA: Diagnosis not present

## 2018-02-21 DIAGNOSIS — R350 Frequency of micturition: Secondary | ICD-10-CM | POA: Diagnosis not present

## 2018-02-23 DIAGNOSIS — N3041 Irradiation cystitis with hematuria: Secondary | ICD-10-CM | POA: Diagnosis not present

## 2018-03-10 DIAGNOSIS — D400 Neoplasm of uncertain behavior of prostate: Secondary | ICD-10-CM | POA: Diagnosis not present

## 2018-03-10 DIAGNOSIS — N3041 Irradiation cystitis with hematuria: Secondary | ICD-10-CM | POA: Diagnosis not present

## 2018-03-10 DIAGNOSIS — N390 Urinary tract infection, site not specified: Secondary | ICD-10-CM | POA: Diagnosis not present

## 2018-03-16 DIAGNOSIS — N35913 Unspecified membranous urethral stricture, male: Secondary | ICD-10-CM | POA: Diagnosis not present

## 2018-03-16 DIAGNOSIS — N390 Urinary tract infection, site not specified: Secondary | ICD-10-CM | POA: Diagnosis not present

## 2018-03-16 DIAGNOSIS — N3041 Irradiation cystitis with hematuria: Secondary | ICD-10-CM | POA: Diagnosis not present

## 2018-03-16 DIAGNOSIS — C61 Malignant neoplasm of prostate: Secondary | ICD-10-CM | POA: Diagnosis not present

## 2018-03-23 DIAGNOSIS — D400 Neoplasm of uncertain behavior of prostate: Secondary | ICD-10-CM | POA: Diagnosis not present

## 2018-03-23 DIAGNOSIS — N35919 Unspecified urethral stricture, male, unspecified site: Secondary | ICD-10-CM | POA: Diagnosis not present

## 2018-03-23 DIAGNOSIS — N3041 Irradiation cystitis with hematuria: Secondary | ICD-10-CM | POA: Diagnosis not present

## 2018-03-23 DIAGNOSIS — C61 Malignant neoplasm of prostate: Secondary | ICD-10-CM | POA: Diagnosis not present

## 2018-04-06 ENCOUNTER — Encounter: Payer: Self-pay | Admitting: Family Medicine

## 2018-04-17 ENCOUNTER — Other Ambulatory Visit: Payer: Self-pay | Admitting: Family Medicine

## 2018-04-18 DIAGNOSIS — M5489 Other dorsalgia: Secondary | ICD-10-CM | POA: Diagnosis not present

## 2018-04-18 DIAGNOSIS — N3041 Irradiation cystitis with hematuria: Secondary | ICD-10-CM | POA: Diagnosis not present

## 2018-04-21 DIAGNOSIS — C61 Malignant neoplasm of prostate: Secondary | ICD-10-CM | POA: Diagnosis not present

## 2018-04-21 DIAGNOSIS — D400 Neoplasm of uncertain behavior of prostate: Secondary | ICD-10-CM | POA: Diagnosis not present

## 2018-04-21 DIAGNOSIS — N3041 Irradiation cystitis with hematuria: Secondary | ICD-10-CM | POA: Diagnosis not present

## 2018-04-21 DIAGNOSIS — N35911 Unspecified urethral stricture, male, meatal: Secondary | ICD-10-CM | POA: Diagnosis not present

## 2018-04-24 ENCOUNTER — Other Ambulatory Visit: Payer: Self-pay | Admitting: Family Medicine

## 2018-04-24 MED ORDER — LISINOPRIL 40 MG PO TABS: 40.0000 mg | ORAL_TABLET | Freq: Every day | ORAL | 1 refills | Status: DC

## 2018-04-24 MED ORDER — METOPROLOL SUCCINATE ER 50 MG PO TB24: 50.0000 mg | ORAL_TABLET | Freq: Every day | ORAL | 1 refills | Status: DC

## 2018-04-24 NOTE — Progress Notes (Signed)
Refills sent at pt's request

## 2018-05-22 ENCOUNTER — Other Ambulatory Visit: Payer: Self-pay

## 2018-05-22 ENCOUNTER — Ambulatory Visit (INDEPENDENT_AMBULATORY_CARE_PROVIDER_SITE_OTHER): Payer: Federal, State, Local not specified - PPO | Admitting: Family Medicine

## 2018-05-22 ENCOUNTER — Ambulatory Visit: Payer: Federal, State, Local not specified - PPO | Admitting: Family Medicine

## 2018-05-22 ENCOUNTER — Encounter: Payer: Self-pay | Admitting: Family Medicine

## 2018-05-22 VITALS — BP 120/70 | HR 72 | Ht 65.0 in | Wt 165.0 lb

## 2018-05-22 DIAGNOSIS — E785 Hyperlipidemia, unspecified: Secondary | ICD-10-CM

## 2018-05-22 DIAGNOSIS — I10 Essential (primary) hypertension: Secondary | ICD-10-CM

## 2018-05-22 MED ORDER — METOPROLOL SUCCINATE ER 25 MG PO TB24: 25.0000 mg | ORAL_TABLET | Freq: Every day | ORAL | 1 refills | Status: DC

## 2018-05-22 NOTE — Progress Notes (Signed)
I have discussed the procedure for the virtual visit with the patient who has given consent to proceed with assessment and treatment.   Makalynn Berwanger L Chevis Weisensel, CMA     

## 2018-05-22 NOTE — Progress Notes (Signed)
Virtual Visit via Video   I connected with patient on 05/22/18 at  8:20 AM EDT by a video enabled telemedicine application and verified that I am speaking with the correct person using two identifiers.  Location patient: Home Location provider: Acupuncturist, Office Persons participating in the virtual visit: Patient, Provider, [place] (Jess B)  I discussed the limitations of evaluation and management by telemedicine and the availability of in person appointments. The patient expressed understanding and agreed to proceed.  Subjective:   HPI:   HTN- chronic problem, on Lisinopril 40mg  daily, Matzim 360mg  daily, Metoprolol 50mg  daily w/ good control.  Pt has been taking Metoprolol 25mg  daily in addition to 50mg  and is asking for new script.  No CP, SOB, HAs, visual changes, edema.  Hyperlipidemia- chronic problem, on Lipitor 20mg  daily.  No abd pain, N/V.  Running 45 minutes/day.    ROS:   See pertinent positives and negatives per HPI.  Patient Active Problem List   Diagnosis Date Noted  . Irradiation cystitis with hematuria 10/09/2015  . Radiation necrosis of skin and subcutaneous 10/09/2015  . Routine general medical examination at a health care facility 11/22/2012  . HTN (hypertension) 03/02/2012  . Hyperlipidemia 03/02/2012  . Prostate cancer ([place]) 03/02/2012    Social History   Tobacco Use  . Smoking status: Former Smoker    Types: Cigarettes    Last attempt to quit: 09/24/2011    Years since quitting: 6.6  . Smokeless tobacco: Never Used  Substance Use Topics  . Alcohol use: Yes    Alcohol/week: 2.0 standard drinks    Types: 2 Cans of beer per week    Current Outpatient Medications:  .  Ascorbic Acid (VITAMIN C) 1000 MG tablet, Take 2,000 mg by mouth daily., Disp: , Rfl:  .  atorvastatin (LIPITOR) 20 MG tablet, TAKE 1 TABLET BY MOUTH EVERY DAY, Disp: 90 tablet, Rfl: 1 .  B Complex-C (B-COMPLEX WITH VITAMIN C) tablet, Take 1 tablet by mouth daily., Disp: ,  Rfl:  .  Hyoscyamine Sulfate SL (LEVSIN/SL) 0.125 MG SUBL, Place 0.125 mg under the tongue every 4 (four) hours as needed (bladder spasm). 1-2 TABS, Disp: 30 each, Rfl: 1 .  levofloxacin (LEVAQUIN) 500 MG tablet, Take 1 tablet (500 mg total) by mouth daily., Disp: 3 tablet, Rfl: 1 .  lisinopril (ZESTRIL) 40 MG tablet, Take 1 tablet (40 mg total) by mouth daily., Disp: 90 tablet, Rfl: 1 .  MATZIM LA 360 MG 24 hr tablet, TAKE 1 TABLET (360 MG TOTAL) BY MOUTH DAILY., Disp: 90 tablet, Rfl: 2 .  Meth-Hyo-M Bl-Na Phos-Ph Sal (URIBEL) 118 MG CAPS, Take 1 capsule (118 mg total) by mouth every 6 (six) hours as needed (Dysuria)., Disp: 40 capsule, Rfl: 3 .  metoprolol succinate (TOPROL-XL) 50 MG 24 hr tablet, Take 1 tablet (50 mg total) by mouth daily. Take with or immediately following a meal., Disp: 90 tablet, Rfl: 1 .  Multiple Vitamin (MULTIVITAMIN) tablet, Take 1 tablet by mouth daily., Disp: , Rfl:  .  pentosan polysulfate (ELMIRON) 100 MG capsule, Take 100 mg by mouth 3 (three) times daily., Disp: , Rfl:  .  tadalafil (CIALIS) 5 MG tablet, Take 5 mg by mouth daily., Disp: , Rfl:  .  tamsulosin (FLOMAX) 0.4 MG CAPS capsule, Take 0.4 mg by mouth daily. , Disp: , Rfl:   No Known Allergies  Objective:   BP 120/70   Pulse 72   Ht 5\' 5"  (1.651 m)   Wt  165 lb (74.8 kg)   BMI 27.46 kg/m   AAOx3, NAD NCAT, EOMI No obvious CN deficits Coloring WNL Pt is able to speak clearly, coherently without shortness of breath or increased work of breathing.  Thought process is linear.  Mood is appropriate.   Assessment and Plan:   HTN- chronic problem. Well controlled.  Asymptomatic.  Pt reports he spends '3 months on my program' of healthy eating, low Na and then '3 months off to enjoy life'.  During his 3 months off, he requires an extra 25mg  of metoprolol.  Prescription sent.  Pt not willing to do labs at this time.  Will follow.  Hyperlipidemia- chronic problem.  Tolerating statin w/o difficulty.   Continues to exercise but admits this is the time of year he fails on his diet.  Will continue to follow.   Annye Asa, MD 05/22/2018

## 2018-06-02 DIAGNOSIS — D400 Neoplasm of uncertain behavior of prostate: Secondary | ICD-10-CM | POA: Diagnosis not present

## 2018-06-02 DIAGNOSIS — N3041 Irradiation cystitis with hematuria: Secondary | ICD-10-CM | POA: Diagnosis not present

## 2018-06-02 DIAGNOSIS — C61 Malignant neoplasm of prostate: Secondary | ICD-10-CM | POA: Diagnosis not present

## 2018-08-04 DIAGNOSIS — N35911 Unspecified urethral stricture, male, meatal: Secondary | ICD-10-CM | POA: Diagnosis not present

## 2018-08-04 DIAGNOSIS — D400 Neoplasm of uncertain behavior of prostate: Secondary | ICD-10-CM | POA: Diagnosis not present

## 2018-08-04 DIAGNOSIS — N3041 Irradiation cystitis with hematuria: Secondary | ICD-10-CM | POA: Diagnosis not present

## 2018-08-04 DIAGNOSIS — C61 Malignant neoplasm of prostate: Secondary | ICD-10-CM | POA: Diagnosis not present

## 2018-10-23 ENCOUNTER — Other Ambulatory Visit: Payer: Self-pay | Admitting: Family Medicine

## 2018-11-08 ENCOUNTER — Other Ambulatory Visit: Payer: Self-pay | Admitting: Family Medicine

## 2018-11-08 MED ORDER — DILTIAZEM HCL ER COATED BEADS 360 MG PO TB24: ORAL_TABLET | ORAL | 2 refills | Status: DC

## 2018-11-08 NOTE — Telephone Encounter (Signed)
Pt called in asking if [name] is ok with him taking the Generic brand of the Matzim LA 360 MG. Pt states the pharmacy told they are out of the brand name and are not able to get it again. Please advise and send in a new script to [facility] in [place].

## 2018-11-23 ENCOUNTER — Other Ambulatory Visit: Payer: Self-pay | Admitting: Family Medicine

## 2018-11-27 ENCOUNTER — Ambulatory Visit (INDEPENDENT_AMBULATORY_CARE_PROVIDER_SITE_OTHER): Payer: Medicare Other | Admitting: Family Medicine

## 2018-11-27 ENCOUNTER — Encounter: Payer: Self-pay | Admitting: Family Medicine

## 2018-11-27 ENCOUNTER — Other Ambulatory Visit: Payer: Self-pay

## 2018-11-27 VITALS — BP 114/67 | HR 68 | Ht 65.0 in | Wt 162.0 lb

## 2018-11-27 DIAGNOSIS — Z0000 Encounter for general adult medical examination without abnormal findings: Secondary | ICD-10-CM | POA: Diagnosis not present

## 2018-11-27 NOTE — Progress Notes (Signed)
I have discussed the procedure for the virtual visit with the patient who has given consent to proceed with assessment and treatment.   Jessica L Brodmerkel, CMA     

## 2018-11-27 NOTE — Progress Notes (Signed)
Virtual Visit via Video   I connected with patient on 11/27/18 at 10:00 AM EST by a video enabled telemedicine application and verified that I am speaking with the correct person using two identifiers.  Location patient: Home Location provider: Acupuncturist, Office Persons participating in the virtual visit: Patient, Provider, [place] (Jess B)  I discussed the limitations of evaluation and management by telemedicine and the availability of in person appointments. The patient expressed understanding and agreed to proceed.  Subjective:   HPI:   CPE- UTD on flu shot, Tdap, urology.  Due for repeat colonoscopy- waiting until COVID has calmed down.  Declines lab visit due to [place].  Exercising regularly but having trouble w/ knees.  ROS:   Patient reports no vision/hearing changes, anorexia, fever ,adenopathy, persistant/recurrent hoarseness, swallowing issues, chest pain, palpitations, edema, persistant/recurrent cough, hemoptysis, dyspnea (rest,exertional, paroxysmal nocturnal), gastrointestinal  bleeding (melena, rectal bleeding), abdominal pain, excessive heart burn, GU symptoms (dysuria, hematuria, voiding/incontinence issues) syncope, focal weakness, memory loss, numbness & tingling, skin/hair/nail changes, depression, anxiety, abnormal bruising/bleeding, musculoskeletal symptoms/signs.   Patient Active Problem List   Diagnosis Date Noted  . Irradiation cystitis with hematuria 10/09/2015  . Radiation necrosis of skin and subcutaneous 10/09/2015  . Routine general medical examination at a health care facility 11/22/2012  . HTN (hypertension) 03/02/2012  . Hyperlipidemia 03/02/2012  . Prostate cancer ([place]) 03/02/2012    Social History   Tobacco Use  . Smoking status: Former Smoker    Types: Cigarettes    Quit date: 09/24/2011    Years since quitting: 7.1  . Smokeless tobacco: Never Used  Substance Use Topics  . Alcohol use: Yes    Alcohol/week: 2.0 standard drinks   Types: 2 Cans of beer per week    Current Outpatient Medications:  .  Ascorbic Acid (VITAMIN C) 1000 MG tablet, Take 2,000 mg by mouth daily., Disp: , Rfl:  .  atorvastatin (LIPITOR) 20 MG tablet, TAKE 1 TABLET BY MOUTH EVERY DAY, Disp: 90 tablet, Rfl: 1 .  B Complex-C (B-COMPLEX WITH VITAMIN C) tablet, Take 1 tablet by mouth daily., Disp: , Rfl:  .  diltiazem (MATZIM LA) 360 MG 24 hr tablet, TAKE 1 TABLET (360 MG TOTAL) BY MOUTH DAILY., Disp: 90 tablet, Rfl: 2 .  Hyoscyamine Sulfate SL (LEVSIN/SL) 0.125 MG SUBL, Place 0.125 mg under the tongue every 4 (four) hours as needed (bladder spasm). 1-2 TABS, Disp: 30 each, Rfl: 1 .  lisinopril (ZESTRIL) 40 MG tablet, Take 1 tablet (40 mg total) by mouth daily., Disp: 90 tablet, Rfl: 1 .  Meth-Hyo-M Bl-Na Phos-Ph Sal (URIBEL) 118 MG CAPS, Take 1 capsule (118 mg total) by mouth every 6 (six) hours as needed (Dysuria)., Disp: 40 capsule, Rfl: 3 .  metoprolol succinate (TOPROL-XL) 25 MG 24 hr tablet, TAKE 1 TABLET BY MOUTH EVERY DAY, Disp: 90 tablet, Rfl: 1 .  metoprolol succinate (TOPROL-XL) 50 MG 24 hr tablet, TAKE 1 TABLET (50 MG TOTAL) BY MOUTH DAILY. TAKE WITH OR IMMEDIATELY FOLLOWING A MEAL., Disp: 90 tablet, Rfl: 1 .  Multiple Vitamin (MULTIVITAMIN) tablet, Take 1 tablet by mouth daily., Disp: , Rfl:  .  pentosan polysulfate (ELMIRON) 100 MG capsule, Take 100 mg by mouth 3 (three) times daily., Disp: , Rfl:  .  tadalafil (CIALIS) 5 MG tablet, Take 5 mg by mouth daily., Disp: , Rfl:  .  tamsulosin (FLOMAX) 0.4 MG CAPS capsule, Take 0.4 mg by mouth daily. , Disp: , Rfl:   No Known Allergies  Objective:   BP 114/67   Pulse 68   Ht 5\' 5"  (1.651 m)   Wt 162 lb (73.5 kg)   BMI 26.96 kg/m   AAOx3, NAD NCAT, EOMI No obvious CN deficits Coloring WNL Pt is able to speak clearly, coherently without shortness of breath or increased work of breathing.  Thought process is linear.  Mood is appropriate.   Assessment and Plan:   CPE- pt is UTD on  immunizations, urology.  Due for repeat colonoscopy but is waiting until Spring due to [place].  He declines a lab visit at this time due to [place].  Encouraged healthy diet and regular exercise.  Pt to f/u in 6 months for BP and cholesterol.  Pt expressed understanding and is in agreement w/ plan.    Annye Asa, MD 11/27/2018

## 2018-12-06 ENCOUNTER — Telehealth: Payer: Self-pay | Admitting: Family Medicine

## 2018-12-06 NOTE — Telephone Encounter (Signed)
Lm to schedule a 94m follow up on BP/Chol

## 2018-12-25 ENCOUNTER — Other Ambulatory Visit: Payer: Self-pay | Admitting: Family Medicine

## 2019-01-19 DIAGNOSIS — D400 Neoplasm of uncertain behavior of prostate: Secondary | ICD-10-CM | POA: Diagnosis not present

## 2019-01-19 DIAGNOSIS — C61 Malignant neoplasm of prostate: Secondary | ICD-10-CM | POA: Diagnosis not present

## 2019-01-19 DIAGNOSIS — N35919 Unspecified urethral stricture, male, unspecified site: Secondary | ICD-10-CM | POA: Diagnosis not present

## 2019-01-19 DIAGNOSIS — N3041 Irradiation cystitis with hematuria: Secondary | ICD-10-CM | POA: Diagnosis not present

## 2019-03-26 ENCOUNTER — Other Ambulatory Visit: Payer: Self-pay | Admitting: Family Medicine

## 2019-05-22 ENCOUNTER — Other Ambulatory Visit: Payer: Self-pay | Admitting: Family Medicine

## 2019-05-22 NOTE — Telephone Encounter (Signed)
Pt called stating that he was out of medication, please advise next appt is 05/25 with Birdie Riddle

## 2019-05-22 NOTE — Telephone Encounter (Signed)
Medication filled to pharmacy as requested.   

## 2019-05-29 ENCOUNTER — Telehealth (INDEPENDENT_AMBULATORY_CARE_PROVIDER_SITE_OTHER): Payer: Medicare Other | Admitting: Family Medicine

## 2019-05-29 ENCOUNTER — Other Ambulatory Visit: Payer: Self-pay

## 2019-05-29 ENCOUNTER — Encounter: Payer: Self-pay | Admitting: Family Medicine

## 2019-05-29 VITALS — BP 113/62 | HR 68 | Ht 65.0 in | Wt 170.0 lb

## 2019-05-29 DIAGNOSIS — I10 Essential (primary) hypertension: Secondary | ICD-10-CM | POA: Diagnosis not present

## 2019-05-29 DIAGNOSIS — E663 Overweight: Secondary | ICD-10-CM

## 2019-05-29 DIAGNOSIS — E785 Hyperlipidemia, unspecified: Secondary | ICD-10-CM | POA: Diagnosis not present

## 2019-05-29 DIAGNOSIS — E669 Obesity, unspecified: Secondary | ICD-10-CM | POA: Insufficient documentation

## 2019-05-29 NOTE — Progress Notes (Signed)
Virtual Visit via Video   I connected with patient on 05/29/19 at  8:30 AM EDT by a video enabled telemedicine application and verified that I am speaking with the correct person using two identifiers.  Location patient: Home Location provider: Acupuncturist, Office Persons participating in the virtual visit: Patient, Provider, [place] (Jess B)  I discussed the limitations of evaluation and management by telemedicine and the availability of in person appointments. The patient expressed understanding and agreed to proceed.  Subjective:   HPI:   HTN- chronic problem, on Diltiazem 360mg  daily, Lisinopril 40mg  daily, Metoprolol.  BP is excellent today.  No CP, SOB, HAs, visual changes, edema.  Hyperlipidemia- chronic problem, on Lipitor 20mg  daily.  No abd pain, N/V.  Overweight- pt has gained 8 lbs since last visit.  BMI now 28.29.  Exercise is currently limited by knee pain.  Is considering partial knee replacement  ROS:   See pertinent positives and negatives per HPI.  Patient Active Problem List   Diagnosis Date Noted  . Irradiation cystitis with hematuria 10/09/2015  . Radiation necrosis of skin and subcutaneous 10/09/2015  . Routine general medical examination at a health care facility 11/22/2012  . HTN (hypertension) 03/02/2012  . Hyperlipidemia 03/02/2012  . Prostate cancer ([place]) 03/02/2012    Social History   Tobacco Use  . Smoking status: Former Smoker    Types: Cigarettes    Quit date: 09/24/2011    Years since quitting: 7.6  . Smokeless tobacco: Never Used  Substance Use Topics  . Alcohol use: Yes    Alcohol/week: 2.0 standard drinks    Types: 2 Cans of beer per week    Current Outpatient Medications:  .  Ascorbic Acid (VITAMIN C) 1000 MG tablet, Take 2,000 mg by mouth daily., Disp: , Rfl:  .  atorvastatin (LIPITOR) 20 MG tablet, TAKE 1 TABLET BY MOUTH EVERY DAY, Disp: 90 tablet, Rfl: 1 .  B Complex-C (B-COMPLEX WITH VITAMIN C) tablet, Take 1 tablet by  mouth daily., Disp: , Rfl:  .  diltiazem (MATZIM LA) 360 MG 24 hr tablet, TAKE 1 TABLET (360 MG TOTAL) BY MOUTH DAILY., Disp: 90 tablet, Rfl: 2 .  lisinopril (ZESTRIL) 40 MG tablet, TAKE 1 TABLET BY MOUTH EVERY DAY, Disp: 90 tablet, Rfl: 1 .  metoprolol succinate (TOPROL-XL) 25 MG 24 hr tablet, TAKE 1 TABLET BY MOUTH EVERY DAY, Disp: 90 tablet, Rfl: 1 .  metoprolol succinate (TOPROL-XL) 50 MG 24 hr tablet, TAKE 1 TABLET (50 MG TOTAL) BY MOUTH DAILY. TAKE WITH OR IMMEDIATELY FOLLOWING A MEAL., Disp: 90 tablet, Rfl: 1 .  Multiple Vitamin (MULTIVITAMIN) tablet, Take 1 tablet by mouth daily., Disp: , Rfl:  .  pentosan polysulfate (ELMIRON) 100 MG capsule, Take 100 mg by mouth 3 (three) times daily., Disp: , Rfl:  .  tadalafil (CIALIS) 5 MG tablet, Take 5 mg by mouth daily., Disp: , Rfl:  .  tamsulosin (FLOMAX) 0.4 MG CAPS capsule, Take 0.4 mg by mouth daily. , Disp: , Rfl:   No Known Allergies  Objective:   BP 113/62   Pulse 68   Ht 5\' 5"  (1.651 m)   Wt 170 lb (77.1 kg)   BMI 28.29 kg/m   AAOx3, NAD NCAT, EOMI No obvious CN deficits Coloring WNL Pt is able to speak clearly, coherently without shortness of breath or increased work of breathing.  Thought process is linear.  Mood is appropriate.   Assessment and Plan:   HTN- chronic problem.  Excellent  control.  Asymptomatic.  Check labs.  No anticipated med changes.  Will follow.  Hyperlipidemia- chronic problem.  Tolerating statin w/o difficulty.  Check labs.  Adjust meds prn   Overweight- pt has gained 8 lbs since last visit but is having issues w/ his knee (seeing Ortho) that is limiting his ability to exercise.  Will follow.   [name] Asa, MD 05/29/2019

## 2019-05-29 NOTE — Progress Notes (Signed)
I have discussed the procedure for the virtual visit with the patient who has given consent to proceed with assessment and treatment.   Sherleen Pangborn L [employer], CMA     

## 2019-06-11 ENCOUNTER — Other Ambulatory Visit (INDEPENDENT_AMBULATORY_CARE_PROVIDER_SITE_OTHER): Payer: Medicare Other

## 2019-06-11 DIAGNOSIS — I10 Essential (primary) hypertension: Secondary | ICD-10-CM

## 2019-06-11 DIAGNOSIS — E785 Hyperlipidemia, unspecified: Secondary | ICD-10-CM | POA: Diagnosis not present

## 2019-06-11 LAB — LIPID PANEL
Cholesterol: 137 mg/dL (ref 0–200)
HDL: 52.5 mg/dL (ref >39.00)
LDL Cholesterol: 74 mg/dL (ref 0–99)
NonHDL: 84.91
Total CHOL/HDL Ratio: 3
Triglycerides: 54 mg/dL (ref 0.0–149.0)
VLDL: 10.8 mg/dL (ref 0.0–40.0)

## 2019-06-11 LAB — BASIC METABOLIC PANEL
BUN: 20 mg/dL (ref 6–23)
CO2: 25 mEq/L (ref 19–32)
Calcium: 9.4 mg/dL (ref 8.4–10.5)
Chloride: 101 mEq/L (ref 96–112)
Creatinine, Ser: 0.99 mg/dL (ref 0.40–1.50)
GFR: 75.67 mL/min (ref >60.00)
Glucose, Bld: 128 mg/dL — ABNORMAL HIGH (ref 70–99)
Potassium: 4.1 mEq/L (ref 3.5–5.1)
Sodium: 133 mEq/L — ABNORMAL LOW (ref 135–145)

## 2019-06-11 LAB — CBC WITH DIFFERENTIAL/PLATELET
Basophils Absolute: 0 10*3/uL (ref 0.0–0.1)
Basophils Relative: 0.7 % (ref 0.0–3.0)
Eosinophils Absolute: 0.1 10*3/uL (ref 0.0–0.7)
Eosinophils Relative: 1.1 % (ref 0.0–5.0)
HCT: 40.4 % (ref 39.0–52.0)
Hemoglobin: 14.2 g/dL (ref 13.0–17.0)
Lymphocytes Relative: 27.1 % (ref 12.0–46.0)
Lymphs Abs: 1.3 10*3/uL (ref 0.7–4.0)
MCHC: 35.1 g/dL (ref 30.0–36.0)
MCV: 94.4 fl (ref 78.0–100.0)
Monocytes Absolute: 0.4 10*3/uL (ref 0.1–1.0)
Monocytes Relative: 8.7 % (ref 3.0–12.0)
Neutro Abs: 3.1 10*3/uL (ref 1.4–7.7)
Neutrophils Relative %: 62.4 % (ref 43.0–77.0)
Platelets: 252 10*3/uL (ref 150.0–400.0)
RBC: 4.28 Mil/uL (ref 4.22–5.81)
RDW: 13.3 % (ref 11.5–15.5)
WBC: 4.9 10*3/uL (ref 4.0–10.5)

## 2019-06-11 LAB — HEPATIC FUNCTION PANEL
ALT: 22 U/L (ref 0–53)
AST: 23 U/L (ref 0–37)
Albumin: 4.5 g/dL (ref 3.5–5.2)
Alkaline Phosphatase: 48 U/L (ref 39–117)
Bilirubin, Direct: 0.1 mg/dL (ref 0.0–0.3)
Total Bilirubin: 0.8 mg/dL (ref 0.2–1.2)
Total Protein: 7.7 g/dL (ref 6.0–8.3)

## 2019-06-11 LAB — TSH: TSH: 1.08 u[IU]/mL (ref 0.35–4.50)

## 2019-06-12 ENCOUNTER — Other Ambulatory Visit (INDEPENDENT_AMBULATORY_CARE_PROVIDER_SITE_OTHER): Payer: Medicare Other

## 2019-06-12 DIAGNOSIS — R7309 Other abnormal glucose: Secondary | ICD-10-CM

## 2019-06-12 LAB — HEMOGLOBIN A1C: Hgb A1c MFr Bld: 5 % (ref 4.6–6.5)

## 2019-06-18 ENCOUNTER — Other Ambulatory Visit: Payer: Self-pay | Admitting: Family Medicine

## 2019-08-31 ENCOUNTER — Other Ambulatory Visit: Payer: Self-pay | Admitting: Family Medicine

## 2019-10-12 ENCOUNTER — Other Ambulatory Visit: Payer: Self-pay | Admitting: Family Medicine

## 2019-10-17 ENCOUNTER — Encounter: Payer: Self-pay | Admitting: Family Medicine

## 2019-11-14 ENCOUNTER — Other Ambulatory Visit: Payer: Self-pay | Admitting: Family Medicine

## 2019-11-15 ENCOUNTER — Other Ambulatory Visit: Payer: Self-pay | Admitting: Family Medicine

## 2019-11-15 NOTE — Telephone Encounter (Signed)
Per chart review, RX sent by Marca Ancona, CMA on 11/14/2019.

## 2019-11-28 ENCOUNTER — Encounter: Payer: Self-pay | Admitting: Family Medicine

## 2019-11-28 ENCOUNTER — Other Ambulatory Visit: Payer: Self-pay

## 2019-11-28 ENCOUNTER — Ambulatory Visit (INDEPENDENT_AMBULATORY_CARE_PROVIDER_SITE_OTHER): Payer: Medicare Other | Admitting: Family Medicine

## 2019-11-28 VITALS — BP 148/70 | HR 86 | Temp 98.6°F | Resp 16 | Ht 65.0 in | Wt 165.0 lb

## 2019-11-28 DIAGNOSIS — I10 Essential (primary) hypertension: Secondary | ICD-10-CM

## 2019-11-28 DIAGNOSIS — E785 Hyperlipidemia, unspecified: Secondary | ICD-10-CM

## 2019-11-28 DIAGNOSIS — E663 Overweight: Secondary | ICD-10-CM

## 2019-11-28 DIAGNOSIS — C61 Malignant neoplasm of prostate: Secondary | ICD-10-CM | POA: Diagnosis not present

## 2019-11-28 LAB — BASIC METABOLIC PANEL
BUN: 26 mg/dL — ABNORMAL HIGH (ref 6–23)
CO2: 29 mEq/L (ref 19–32)
Calcium: 10.1 mg/dL (ref 8.4–10.5)
Chloride: 101 mEq/L (ref 96–112)
Creatinine, Ser: 1.37 mg/dL (ref 0.40–1.50)
GFR: 53.84 mL/min — ABNORMAL LOW (ref >60.00)
Glucose, Bld: 98 mg/dL (ref 70–99)
Potassium: 4.4 mEq/L (ref 3.5–5.1)
Sodium: 139 mEq/L (ref 135–145)

## 2019-11-28 LAB — HEPATIC FUNCTION PANEL
ALT: 84 U/L — ABNORMAL HIGH (ref 0–53)
AST: 41 U/L — ABNORMAL HIGH (ref 0–37)
Albumin: 4.8 g/dL (ref 3.5–5.2)
Alkaline Phosphatase: 48 U/L (ref 39–117)
Bilirubin, Direct: 0.2 mg/dL (ref 0.0–0.3)
Total Bilirubin: 0.9 mg/dL (ref 0.2–1.2)
Total Protein: 8 g/dL (ref 6.0–8.3)

## 2019-11-28 LAB — LIPID PANEL
Cholesterol: 127 mg/dL (ref 0–200)
HDL: 55.7 mg/dL (ref >39.00)
LDL Cholesterol: 54 mg/dL (ref 0–99)
NonHDL: 71.34
Total CHOL/HDL Ratio: 2
Triglycerides: 89 mg/dL (ref 0.0–149.0)
VLDL: 17.8 mg/dL (ref 0.0–40.0)

## 2019-11-28 LAB — CBC WITH DIFFERENTIAL/PLATELET
Basophils Absolute: 0 10*3/uL (ref 0.0–0.1)
Basophils Relative: 0.6 % (ref 0.0–3.0)
Eosinophils Absolute: 0 10*3/uL (ref 0.0–0.7)
Eosinophils Relative: 0.5 % (ref 0.0–5.0)
HCT: 43.4 % (ref 39.0–52.0)
Hemoglobin: 14.7 g/dL (ref 13.0–17.0)
Lymphocytes Relative: 23 % (ref 12.0–46.0)
Lymphs Abs: 1.1 10*3/uL (ref 0.7–4.0)
MCHC: 33.8 g/dL (ref 30.0–36.0)
MCV: 96.1 fl (ref 78.0–100.0)
Monocytes Absolute: 0.4 10*3/uL (ref 0.1–1.0)
Monocytes Relative: 8.9 % (ref 3.0–12.0)
Neutro Abs: 3.2 10*3/uL (ref 1.4–7.7)
Neutrophils Relative %: 67 % (ref 43.0–77.0)
Platelets: 223 10*3/uL (ref 150.0–400.0)
RBC: 4.52 Mil/uL (ref 4.22–5.81)
RDW: 13.8 % (ref 11.5–15.5)
WBC: 4.7 10*3/uL (ref 4.0–10.5)

## 2019-11-28 LAB — TSH: TSH: 1.35 u[IU]/mL (ref 0.35–4.50)

## 2019-11-28 NOTE — Assessment & Plan Note (Signed)
Chronic problem.  On Lipitor 20mg daily w/o difficulty.  Check labs.  Adjust meds prn  

## 2019-11-28 NOTE — Assessment & Plan Note (Signed)
Chronic problem.  On Diltiazem 360mg  daily, Lisinopril 40mg  daily, Metoprolol 75mg  daily.  BP is elevated today in office but he has a hx of white coat syndrome.  Home BP this AM was 113/62 (he brought cuff).  No med changes at this time but will continue to follow.

## 2019-11-28 NOTE — Assessment & Plan Note (Signed)
Following w/ urology (Dr Yves Dill)

## 2019-11-28 NOTE — Progress Notes (Signed)
   Subjective:    Patient ID: [name], male    DOB: 1955/02/25, 64 y.o.   MRN: [id number]  HPI HTN- chronic problem, on Diltiazem 360mg  daily, Lisinopril 40mg  daily, Metoprolol 75mg  daily.  BP this AM was 113/62.  No CP, SOB, HAs, visual changes, edema.  Hyperlipidemia- chronic problem, on Lipitor 20mg  daily.  No abd pain, N/V.  Overweight- pt is down 5 lbs since his last visit.  BMI is now 27.46.  Exercising 45 minutes twice daily, 7 days/week.  Reviewed past medical, surgical, family and social histories.    Review of Systems  This visit occurred during the SARS-CoV-2 public health emergency.  Safety protocols were in place, including screening questions prior to the visit, additional usage of staff PPE, and extensive cleaning of exam room while observing appropriate contact time as indicated for disinfecting solutions.       Objective:   Physical Exam General Appearance:    Alert, cooperative, no distress, appears stated age  Head:    Normocephalic, without obvious abnormality, atraumatic  Eyes:    PERRL, conjunctiva/corneas clear, EOM's intact, fundi    benign, both eyes       Ears:    Normal TM's and external ear canals, both ears  Nose:   Deferred due to COVID  Throat:   Neck:   Supple, symmetrical, trachea midline, no adenopathy;       thyroid:  No enlargement/tenderness/nodules  Back:     Symmetric, no curvature, ROM normal, no CVA tenderness  Lungs:     Clear to auscultation bilaterally, respirations unlabored  Chest wall:    No tenderness or deformity  Heart:    Regular rate and rhythm, S1 and S2 normal, no murmur, rub   or gallop  Abdomen:     Soft, non-tender, bowel sounds active all four quadrants,    no masses, no organomegaly  Genitalia:    Deferred   Rectal:    Extremities:   Extremities normal, atraumatic, no cyanosis or edema  Pulses:   2+ and symmetric all extremities  Skin:   Skin color, texture, turgor normal, no rashes or lesions  Lymph nodes:    Cervical, supraclavicular, and axillary nodes normal  Neurologic:   CNII-XII intact. Normal strength, sensation and reflexes      throughout          Assessment & Plan:

## 2019-11-28 NOTE — Assessment & Plan Note (Signed)
Pt is down 5 lbs since last visit in May.  He exercises regularly.  Encouraged him to continue.  Discussed need for healthy diet.  Will continue to follow.

## 2019-11-28 NOTE — Patient Instructions (Addendum)
Follow up in 6 months to recheck BP and cholesterol We'll notify you of your lab results and make any changes if needed Continue to work on healthy diet and regular exercise- you look great! Call with any questions or concerns Stay Safe!  Stay Healthy! Happy Holidays!! 

## 2019-12-12 ENCOUNTER — Other Ambulatory Visit: Payer: Self-pay

## 2019-12-12 ENCOUNTER — Ambulatory Visit (INDEPENDENT_AMBULATORY_CARE_PROVIDER_SITE_OTHER): Payer: Medicare Other

## 2019-12-12 DIAGNOSIS — R7989 Other specified abnormal findings of blood chemistry: Secondary | ICD-10-CM | POA: Diagnosis not present

## 2019-12-12 LAB — HEPATIC FUNCTION PANEL
ALT: 29 U/L (ref 0–53)
AST: 19 U/L (ref 0–37)
Albumin: 4.6 g/dL (ref 3.5–5.2)
Alkaline Phosphatase: 48 U/L (ref 39–117)
Bilirubin, Direct: 0.1 mg/dL (ref 0.0–0.3)
Total Bilirubin: 0.6 mg/dL (ref 0.2–1.2)
Total Protein: 7.6 g/dL (ref 6.0–8.3)

## 2019-12-12 LAB — BASIC METABOLIC PANEL
BUN: 17 mg/dL (ref 6–23)
CO2: 30 mEq/L (ref 19–32)
Calcium: 9.6 mg/dL (ref 8.4–10.5)
Chloride: 98 mEq/L (ref 96–112)
Creatinine, Ser: 1.08 mg/dL (ref 0.40–1.50)
GFR: 71.6 mL/min (ref >60.00)
Glucose, Bld: 100 mg/dL — ABNORMAL HIGH (ref 70–99)
Potassium: 4.2 mEq/L (ref 3.5–5.1)
Sodium: 135 mEq/L (ref 135–145)

## 2019-12-14 ENCOUNTER — Telehealth: Payer: Self-pay | Admitting: Family Medicine

## 2019-12-14 NOTE — Telephone Encounter (Signed)
Pt called in asking if he can come here to have bw done before his upcoming surgery. He states they are going to be faxing over orders to let us know what he needs to have drawn.   Pt can be reached at the cell #   He is aware that Birdie Riddle is out of the office.

## 2019-12-14 NOTE — Telephone Encounter (Signed)
Patient stated that he has an upcoming surgery and he would like to come here for his labs for preop. Is that ok? Please advise

## 2019-12-15 ENCOUNTER — Other Ambulatory Visit: Payer: Self-pay | Admitting: Family Medicine

## 2019-12-17 ENCOUNTER — Telehealth: Payer: Self-pay

## 2019-12-17 NOTE — Telephone Encounter (Signed)
Addressed patient concerns about lab orders for preop. Per Dr. Birdie Riddle it is ok to come here for lab work as long as the orders have been sent prior to arrival.

## 2019-12-17 NOTE — Telephone Encounter (Signed)
As long as we have an order, we can do the labs here

## 2019-12-17 NOTE — Telephone Encounter (Signed)
Called and left pt a message about labs. Will call back if there is any further questions or concerns.

## 2019-12-20 ENCOUNTER — Encounter: Payer: Self-pay | Admitting: Family Medicine

## 2019-12-20 MED ORDER — METOPROLOL SUCCINATE ER 50 MG PO TB24: 50.0000 mg | ORAL_TABLET | Freq: Every day | ORAL | 1 refills | Status: DC

## 2020-01-02 ENCOUNTER — Ambulatory Visit (INDEPENDENT_AMBULATORY_CARE_PROVIDER_SITE_OTHER): Payer: Medicare Other

## 2020-01-02 ENCOUNTER — Other Ambulatory Visit: Payer: Self-pay

## 2020-01-02 DIAGNOSIS — Z7901 Long term (current) use of anticoagulants: Secondary | ICD-10-CM

## 2020-01-02 DIAGNOSIS — Z5181 Encounter for therapeutic drug level monitoring: Secondary | ICD-10-CM

## 2020-01-02 DIAGNOSIS — Z01818 Encounter for other preprocedural examination: Secondary | ICD-10-CM

## 2020-01-02 LAB — BASIC METABOLIC PANEL
BUN: 18 mg/dL (ref 6–23)
CO2: 30 mEq/L (ref 19–32)
Calcium: 9.3 mg/dL (ref 8.4–10.5)
Chloride: 95 mEq/L — ABNORMAL LOW (ref 96–112)
Creatinine, Ser: 0.92 mg/dL (ref 0.40–1.50)
GFR: 86.76 mL/min (ref >60.00)
Glucose, Bld: 93 mg/dL (ref 70–99)
Potassium: 4 mEq/L (ref 3.5–5.1)
Sodium: 132 mEq/L — ABNORMAL LOW (ref 135–145)

## 2020-01-02 LAB — CBC WITH DIFFERENTIAL/PLATELET
Basophils Absolute: 0 10*3/uL (ref 0.0–0.1)
Basophils Relative: 0.6 % (ref 0.0–3.0)
Eosinophils Absolute: 0.1 10*3/uL (ref 0.0–0.7)
Eosinophils Relative: 1 % (ref 0.0–5.0)
HCT: 41.4 % (ref 39.0–52.0)
Hemoglobin: 14.3 g/dL (ref 13.0–17.0)
Lymphocytes Relative: 29.1 % (ref 12.0–46.0)
Lymphs Abs: 1.6 10*3/uL (ref 0.7–4.0)
MCHC: 34.5 g/dL (ref 30.0–36.0)
MCV: 94.7 fl (ref 78.0–100.0)
Monocytes Absolute: 0.5 10*3/uL (ref 0.1–1.0)
Monocytes Relative: 10.1 % (ref 3.0–12.0)
Neutro Abs: 3.1 10*3/uL (ref 1.4–7.7)
Neutrophils Relative %: 59.2 % (ref 43.0–77.0)
Platelets: 267 10*3/uL (ref 150.0–400.0)
RBC: 4.38 Mil/uL (ref 4.22–5.81)
RDW: 13.6 % (ref 11.5–15.5)
WBC: 5.3 10*3/uL (ref 4.0–10.5)

## 2020-01-02 LAB — PROTIME-INR
INR: 1 ratio (ref 0.8–1.0)
Prothrombin Time: 11.1 s (ref 9.6–13.1)

## 2020-01-22 ENCOUNTER — Encounter: Payer: Self-pay | Admitting: Family Medicine

## 2020-01-23 MED ORDER — METOPROLOL SUCCINATE ER 100 MG PO TB24: 100.0000 mg | ORAL_TABLET | Freq: Every day | ORAL | 3 refills | Status: DC

## 2020-01-25 ENCOUNTER — Encounter: Payer: Self-pay | Admitting: Family Medicine

## 2020-01-29 ENCOUNTER — Encounter: Payer: Self-pay | Admitting: Family Medicine

## 2020-01-30 ENCOUNTER — Other Ambulatory Visit: Payer: Self-pay

## 2020-01-30 DIAGNOSIS — Z01818 Encounter for other preprocedural examination: Secondary | ICD-10-CM

## 2020-02-01 ENCOUNTER — Telehealth: Payer: Self-pay | Admitting: Family Medicine

## 2020-02-01 ENCOUNTER — Other Ambulatory Visit (INDEPENDENT_AMBULATORY_CARE_PROVIDER_SITE_OTHER): Payer: Medicare Other

## 2020-02-01 ENCOUNTER — Other Ambulatory Visit: Payer: Self-pay

## 2020-02-01 DIAGNOSIS — Z01818 Encounter for other preprocedural examination: Secondary | ICD-10-CM | POA: Diagnosis not present

## 2020-02-01 LAB — BASIC METABOLIC PANEL
BUN: 18 mg/dL (ref 6–23)
CO2: 30 mEq/L (ref 19–32)
Calcium: 9.9 mg/dL (ref 8.4–10.5)
Chloride: 97 mEq/L (ref 96–112)
Creatinine, Ser: 0.93 mg/dL (ref 0.40–1.50)
GFR: 85.59 mL/min (ref >60.00)
Glucose, Bld: 93 mg/dL (ref 70–99)
Potassium: 3.3 mEq/L — ABNORMAL LOW (ref 3.5–5.1)
Sodium: 133 mEq/L — ABNORMAL LOW (ref 135–145)

## 2020-02-01 LAB — CBC WITH DIFFERENTIAL/PLATELET
Basophils Absolute: 0 10*3/uL (ref 0.0–0.1)
Basophils Relative: 0.7 % (ref 0.0–3.0)
Eosinophils Absolute: 0.1 10*3/uL (ref 0.0–0.7)
Eosinophils Relative: 1.1 % (ref 0.0–5.0)
HCT: 37.1 % — ABNORMAL LOW (ref 39.0–52.0)
Hemoglobin: 13.1 g/dL (ref 13.0–17.0)
Lymphocytes Relative: 23.4 % (ref 12.0–46.0)
Lymphs Abs: 1.3 10*3/uL (ref 0.7–4.0)
MCHC: 35.4 g/dL (ref 30.0–36.0)
MCV: 92.6 fl (ref 78.0–100.0)
Monocytes Absolute: 0.4 10*3/uL (ref 0.1–1.0)
Monocytes Relative: 7.1 % (ref 3.0–12.0)
Neutro Abs: 3.7 10*3/uL (ref 1.4–7.7)
Neutrophils Relative %: 67.7 % (ref 43.0–77.0)
Platelets: 385 10*3/uL (ref 150.0–400.0)
RBC: 4.01 Mil/uL — ABNORMAL LOW (ref 4.22–5.81)
RDW: 13.2 % (ref 11.5–15.5)
WBC: 5.5 10*3/uL (ref 4.0–10.5)

## 2020-02-01 LAB — PROTIME-INR
INR: 1.1 ratio — ABNORMAL HIGH (ref 0.8–1.0)
Prothrombin Time: 12.1 s (ref 9.6–13.1)

## 2020-02-01 NOTE — Telephone Encounter (Signed)
Form placed in Dr. Rande Lawman bin in back office to be filled

## 2020-02-01 NOTE — Telephone Encounter (Signed)
..  Type of form received:Surgical Clearance   Additional comments:   Received by:  Tye Savoy should be Faxed to:  [phone] Form should be mailed to:    Is patient requesting call for pickup:   Form placed:  In Dr. Virgil Benedict bin up front Attach charge sheet.  Provider will determine charge.  Individual made aware of 3-5 business day turn around (Y/N)?

## 2020-02-02 ENCOUNTER — Other Ambulatory Visit: Payer: Self-pay | Admitting: Family Medicine

## 2020-05-13 ENCOUNTER — Ambulatory Visit (INDEPENDENT_AMBULATORY_CARE_PROVIDER_SITE_OTHER): Payer: Medicare Other | Admitting: Family Medicine

## 2020-05-13 ENCOUNTER — Encounter: Payer: Self-pay | Admitting: Family Medicine

## 2020-05-13 ENCOUNTER — Other Ambulatory Visit: Payer: Self-pay

## 2020-05-13 VITALS — BP 132/92 | HR 85 | Temp 98.1°F | Resp 16 | Ht 65.0 in | Wt 169.2 lb

## 2020-05-13 DIAGNOSIS — E663 Overweight: Secondary | ICD-10-CM | POA: Diagnosis not present

## 2020-05-13 DIAGNOSIS — E785 Hyperlipidemia, unspecified: Secondary | ICD-10-CM | POA: Diagnosis not present

## 2020-05-13 DIAGNOSIS — I10 Essential (primary) hypertension: Secondary | ICD-10-CM

## 2020-05-13 LAB — LIPID PANEL
Cholesterol: 147 mg/dL (ref 0–200)
HDL: 66.2 mg/dL (ref >39.00)
LDL Cholesterol: 66 mg/dL (ref 0–99)
NonHDL: 80.95
Total CHOL/HDL Ratio: 2
Triglycerides: 75 mg/dL (ref 0.0–149.0)
VLDL: 15 mg/dL (ref 0.0–40.0)

## 2020-05-13 LAB — HEPATIC FUNCTION PANEL
ALT: 21 U/L (ref 0–53)
AST: 17 U/L (ref 0–37)
Albumin: 4.7 g/dL (ref 3.5–5.2)
Alkaline Phosphatase: 52 U/L (ref 39–117)
Bilirubin, Direct: 0.2 mg/dL (ref 0.0–0.3)
Total Bilirubin: 0.9 mg/dL (ref 0.2–1.2)
Total Protein: 8.1 g/dL (ref 6.0–8.3)

## 2020-05-13 LAB — CBC WITH DIFFERENTIAL/PLATELET
Basophils Absolute: 0 10*3/uL (ref 0.0–0.1)
Basophils Relative: 0.6 % (ref 0.0–3.0)
Eosinophils Absolute: 0 10*3/uL (ref 0.0–0.7)
Eosinophils Relative: 0.6 % (ref 0.0–5.0)
HCT: 43.5 % (ref 39.0–52.0)
Hemoglobin: 14.9 g/dL (ref 13.0–17.0)
Lymphocytes Relative: 21.5 % (ref 12.0–46.0)
Lymphs Abs: 1.2 10*3/uL (ref 0.7–4.0)
MCHC: 34.4 g/dL (ref 30.0–36.0)
MCV: 92.7 fl (ref 78.0–100.0)
Monocytes Absolute: 0.5 10*3/uL (ref 0.1–1.0)
Monocytes Relative: 9 % (ref 3.0–12.0)
Neutro Abs: 3.7 10*3/uL (ref 1.4–7.7)
Neutrophils Relative %: 68.3 % (ref 43.0–77.0)
Platelets: 265 10*3/uL (ref 150.0–400.0)
RBC: 4.69 Mil/uL (ref 4.22–5.81)
RDW: 14.8 % (ref 11.5–15.5)
WBC: 5.5 10*3/uL (ref 4.0–10.5)

## 2020-05-13 LAB — BASIC METABOLIC PANEL
BUN: 16 mg/dL (ref 6–23)
CO2: 30 mEq/L (ref 19–32)
Calcium: 9.9 mg/dL (ref 8.4–10.5)
Chloride: 97 mEq/L (ref 96–112)
Creatinine, Ser: 0.96 mg/dL (ref 0.40–1.50)
GFR: 82.23 mL/min (ref >60.00)
Glucose, Bld: 86 mg/dL (ref 70–99)
Potassium: 4.2 mEq/L (ref 3.5–5.1)
Sodium: 136 mEq/L (ref 135–145)

## 2020-05-13 LAB — TSH: TSH: 0.88 u[IU]/mL (ref 0.35–4.50)

## 2020-05-13 MED ORDER — LISINOPRIL 40 MG PO TABS: 40.0000 mg | ORAL_TABLET | Freq: Every day | ORAL | 1 refills | Status: DC

## 2020-05-13 MED ORDER — ATORVASTATIN CALCIUM 20 MG PO TABS: 1.0000 | ORAL_TABLET | Freq: Every day | ORAL | 1 refills | Status: DC

## 2020-05-13 NOTE — Assessment & Plan Note (Signed)
Chronic problem.  Adequate control on Diltiazem, Lisinopril, and Metoprolol.  BP is mildly elevated today due to knee pain from the walk in.  Currently asymptomatic and home BPs are well controlled.  Check labs but no anticipated med changes at this time

## 2020-05-13 NOTE — Assessment & Plan Note (Signed)
Pt has gained 5 lbs since last visit but this is due to recent knee surgery interfering w/ his usual exercise routine.  Will continue to follow.

## 2020-05-13 NOTE — Patient Instructions (Signed)
Follow up in 6 months to recheck BP and cholesterol We'll notify you of your lab results and make may changes if needed Continue to work on healthy diet and regular exercise- you can do it! Call with any questions or concerns Stay Safe!  Stay Healthy! Enjoy your trip!!!

## 2020-05-13 NOTE — Progress Notes (Signed)
   Subjective:    Patient ID: [name], male    DOB: 1955/05/04, 65 y.o.   MRN: [id number]  HPI HTN- chronic problem, on Diltiazem 360mg  daily, Lisinopril 40mg  daily, Metoprolol XL 100mg  daily.  Denies CP, SOB, HAs, visual changes, edema.  Hyperlipidemia- chronic problem, on Lipitor 20mg  daily.  No abd pain, N/V  Overweight- pt has gained 5 lbs since last visit.  Pt has not been able to exercise regularly due to recent knee surgeries.  Doing PT   Review of Systems For ROS see HPI   This visit occurred during the SARS-CoV-2 public health emergency.  Safety protocols were in place, including screening questions prior to the visit, additional usage of staff PPE, and extensive cleaning of exam room while observing appropriate contact time as indicated for disinfecting solutions.       Objective:   Physical Exam Vitals reviewed.  Constitutional:      General: He is not in acute distress.    Appearance: Normal appearance. He is well-developed. He is not ill-appearing.  HENT:     Head: Normocephalic and atraumatic.  Eyes:     Conjunctiva/sclera: Conjunctivae normal.     Pupils: Pupils are equal, round, and reactive to light.  Neck:     Thyroid: No thyromegaly.  Cardiovascular:     Rate and Rhythm: Normal rate and regular rhythm.     Pulses: Normal pulses.     Heart sounds: Normal heart sounds. No murmur heard.   Pulmonary:     Effort: Pulmonary effort is normal. No respiratory distress.     Breath sounds: Normal breath sounds.  Abdominal:     General: Bowel sounds are normal. There is no distension.     Palpations: Abdomen is soft.  Musculoskeletal:     Cervical back: Normal range of motion and neck supple.     Right lower leg: No edema.     Left lower leg: No edema.  Lymphadenopathy:     Cervical: No cervical adenopathy.  Skin:    General: Skin is warm and dry.  Neurological:     Mental Status: He is alert and oriented to person, place, and time.     Cranial  Nerves: No cranial nerve deficit.  Psychiatric:        Mood and Affect: Mood normal.        Behavior: Behavior normal.        Thought Content: Thought content normal.           Assessment & Plan:

## 2020-05-13 NOTE — Assessment & Plan Note (Signed)
Chronic problem.  Tolerating Lipitor 20mg daily w/o difficulty.  Check labs.  Adjust meds prn  

## 2020-05-14 ENCOUNTER — Telehealth: Payer: Self-pay

## 2020-05-14 NOTE — Telephone Encounter (Signed)
Patient called back.  I gave him Dr. Virgil Benedict response in regard to labs.  Patient understood.

## 2020-07-02 ENCOUNTER — Encounter: Payer: Self-pay | Admitting: *Deleted

## 2020-07-12 ENCOUNTER — Other Ambulatory Visit: Payer: Self-pay | Admitting: Family Medicine

## 2020-11-25 ENCOUNTER — Other Ambulatory Visit: Payer: Self-pay | Admitting: Family Medicine

## 2020-12-03 ENCOUNTER — Other Ambulatory Visit: Payer: Self-pay | Admitting: Family Medicine

## 2021-01-29 ENCOUNTER — Encounter: Payer: Self-pay | Admitting: Family Medicine

## 2021-01-29 ENCOUNTER — Ambulatory Visit (INDEPENDENT_AMBULATORY_CARE_PROVIDER_SITE_OTHER): Payer: Medicare Other | Admitting: Family Medicine

## 2021-01-29 ENCOUNTER — Encounter: Payer: Federal, State, Local not specified - PPO | Admitting: Family Medicine

## 2021-01-29 VITALS — BP 142/88 | HR 82 | Temp 99.1°F | Ht 65.0 in | Wt 179.0 lb

## 2021-01-29 DIAGNOSIS — E785 Hyperlipidemia, unspecified: Secondary | ICD-10-CM

## 2021-01-29 DIAGNOSIS — Z1211 Encounter for screening for malignant neoplasm of colon: Secondary | ICD-10-CM

## 2021-01-29 DIAGNOSIS — E663 Overweight: Secondary | ICD-10-CM | POA: Diagnosis not present

## 2021-01-29 DIAGNOSIS — I10 Essential (primary) hypertension: Secondary | ICD-10-CM | POA: Diagnosis not present

## 2021-01-29 LAB — BASIC METABOLIC PANEL
BUN: 25 mg/dL — ABNORMAL HIGH (ref 6–23)
CO2: 27 mEq/L (ref 19–32)
Calcium: 10 mg/dL (ref 8.4–10.5)
Chloride: 103 mEq/L (ref 96–112)
Creatinine, Ser: 1.39 mg/dL (ref 0.40–1.50)
GFR: 52.48 mL/min — ABNORMAL LOW (ref >60.00)
Glucose, Bld: 87 mg/dL (ref 70–99)
Potassium: 4.3 mEq/L (ref 3.5–5.1)
Sodium: 139 mEq/L (ref 135–145)

## 2021-01-29 LAB — CBC WITH DIFFERENTIAL/PLATELET
Basophils Absolute: 0 10*3/uL (ref 0.0–0.1)
Basophils Relative: 0.6 % (ref 0.0–3.0)
Eosinophils Absolute: 0.1 10*3/uL (ref 0.0–0.7)
Eosinophils Relative: 0.9 % (ref 0.0–5.0)
HCT: 43.5 % (ref 39.0–52.0)
Hemoglobin: 14.9 g/dL (ref 13.0–17.0)
Lymphocytes Relative: 18.3 % (ref 12.0–46.0)
Lymphs Abs: 1.2 10*3/uL (ref 0.7–4.0)
MCHC: 34.3 g/dL (ref 30.0–36.0)
MCV: 93.8 fl (ref 78.0–100.0)
Monocytes Absolute: 0.6 10*3/uL (ref 0.1–1.0)
Monocytes Relative: 8.7 % (ref 3.0–12.0)
Neutro Abs: 4.6 10*3/uL (ref 1.4–7.7)
Neutrophils Relative %: 71.5 % (ref 43.0–77.0)
Platelets: 224 10*3/uL (ref 150.0–400.0)
RBC: 4.64 Mil/uL (ref 4.22–5.81)
RDW: 13.7 % (ref 11.5–15.5)
WBC: 6.5 10*3/uL (ref 4.0–10.5)

## 2021-01-29 LAB — LIPID PANEL
Cholesterol: 144 mg/dL (ref 0–200)
HDL: 41.1 mg/dL (ref >39.00)
LDL Cholesterol: 82 mg/dL (ref 0–99)
NonHDL: 102.92
Total CHOL/HDL Ratio: 4
Triglycerides: 103 mg/dL (ref 0.0–149.0)
VLDL: 20.6 mg/dL (ref 0.0–40.0)

## 2021-01-29 LAB — HEPATIC FUNCTION PANEL
ALT: 36 U/L (ref 0–53)
AST: 33 U/L (ref 0–37)
Albumin: 4.8 g/dL (ref 3.5–5.2)
Alkaline Phosphatase: 50 U/L (ref 39–117)
Bilirubin, Direct: 0.2 mg/dL (ref 0.0–0.3)
Total Bilirubin: 1 mg/dL (ref 0.2–1.2)
Total Protein: 8.1 g/dL (ref 6.0–8.3)

## 2021-01-29 LAB — TSH: TSH: 1.76 u[IU]/mL (ref 0.35–5.50)

## 2021-01-29 MED ORDER — NEBIVOLOL HCL 10 MG PO TABS: 10.0000 mg | ORAL_TABLET | Freq: Every day | ORAL | 1 refills | Status: DC

## 2021-01-29 NOTE — Patient Instructions (Signed)
Follow up as scheduled or as needed We'll notify you of your lab results and make any changes if needed Continue to work on healthy diet and regular exercise- you're doing great! Call with any questions or concerns Stay Safe!  Stay Healthy! Happy New Year!!

## 2021-01-29 NOTE — Progress Notes (Signed)
° °  Subjective:    Patient ID: [name], male    DOB: [birth year], 66 y.o.   MRN: [id number]  HPI HTN- chronic problem, on Diltiazem 360mg  daily, Lisinopril 40mg  daily, Metoprolol XL 100mg  daily.  BP is mildly elevated today.  Home reading was 119/62.  No CP, SOB, HAs, visual changes, edema.  Pt is back to running, exercising regularly.    Hyperlipidemia- chronic problem, on Lipitor 20mg  daily.  Denies abd pain, N/V.  Overweight- pt has gained 10 lbs since last visit.  Pt has not been eating well due to holidays and vacations.  Now exercising regularly.  Due for repeat colonoscopy   Review of Systems For ROS see HPI   This visit occurred during the SARS-CoV-2 public health emergency.  Safety protocols were in place, including screening questions prior to the visit, additional usage of staff PPE, and extensive cleaning of exam room while observing appropriate contact time as indicated for disinfecting solutions.      Objective:   Physical Exam Vitals reviewed.  Constitutional:      General: He is not in acute distress.    Appearance: Normal appearance. He is well-developed. He is not ill-appearing.  HENT:     Head: Normocephalic and atraumatic.  Eyes:     Extraocular Movements: Extraocular movements intact.     Conjunctiva/sclera: Conjunctivae normal.     Pupils: Pupils are equal, round, and reactive to light.  Neck:     Thyroid: No thyromegaly.  Cardiovascular:     Rate and Rhythm: Normal rate and regular rhythm.     Pulses: Normal pulses.     Heart sounds: Normal heart sounds. No murmur heard. Pulmonary:     Effort: Pulmonary effort is normal. No respiratory distress.     Breath sounds: Normal breath sounds.  Abdominal:     General: Bowel sounds are normal. There is no distension.     Palpations: Abdomen is soft.  Musculoskeletal:     Cervical back: Normal range of motion and neck supple.     Right lower leg: No edema.     Left lower leg: No edema.  Lymphadenopathy:      Cervical: No cervical adenopathy.  Skin:    General: Skin is warm and dry.  Neurological:     General: No focal deficit present.     Mental Status: He is alert and oriented to person, place, and time.     Cranial Nerves: No cranial nerve deficit.  Psychiatric:        Mood and Affect: Mood normal.        Behavior: Behavior normal.          Assessment & Plan:

## 2021-02-02 NOTE — Assessment & Plan Note (Signed)
Pt has gained 10 lbs since last visit.  Admits to poor eating over the holidays and vacations.  But he is back to exercising regularly now that his knee feels better.  Will follow.

## 2021-02-02 NOTE — Assessment & Plan Note (Signed)
Chronic problem.  On Lipitor 20mg  daily.  Currently asymptomatic.  Check labs.  Adjust meds prn

## 2021-02-02 NOTE — Assessment & Plan Note (Signed)
Chronic problem.  Mildly elevated today but this frequently occurs at OV.  Home BP was 119/62.  Currently on Diltiazem, Lisinopril, and Metoprolol w/o difficulty.  Check labs due to ACE but no anticipated med changes.  Will follow.

## 2021-03-02 ENCOUNTER — Encounter: Payer: Self-pay | Admitting: Family Medicine

## 2021-03-04 MED ORDER — METOPROLOL SUCCINATE ER 100 MG PO TB24: 100.0000 mg | ORAL_TABLET | Freq: Every day | ORAL | 3 refills | Status: DC

## 2021-03-18 ENCOUNTER — Encounter: Payer: Self-pay | Admitting: Gastroenterology

## 2021-03-20 ENCOUNTER — Other Ambulatory Visit: Payer: Self-pay

## 2021-03-20 ENCOUNTER — Ambulatory Visit (AMBULATORY_SURGERY_CENTER): Payer: Self-pay

## 2021-03-20 VITALS — Ht 65.0 in | Wt 175.0 lb

## 2021-03-20 DIAGNOSIS — Z1211 Encounter for screening for malignant neoplasm of colon: Secondary | ICD-10-CM

## 2021-03-20 MED ORDER — NA SULFATE-K SULFATE-MG SULF 17.5-3.13-1.6 GM/177ML PO SOLN: 1.0000 | Freq: Once | ORAL | 0 refills | Status: AC

## 2021-03-20 NOTE — Progress Notes (Signed)
Denies allergies to eggs or soy products. Denies complication of anesthesia or sedation. Denies use of weight loss medication. Denies use of O2.   Emmi instructions given for colonoscopy.  

## 2021-03-22 ENCOUNTER — Encounter: Payer: Self-pay | Admitting: Certified Registered Nurse Anesthetist

## 2021-03-29 ENCOUNTER — Other Ambulatory Visit: Payer: Self-pay | Admitting: Family Medicine

## 2021-03-31 ENCOUNTER — Encounter: Payer: Self-pay | Admitting: Certified Registered Nurse Anesthetist

## 2021-04-01 ENCOUNTER — Ambulatory Visit (AMBULATORY_SURGERY_CENTER): Payer: Medicare Other | Admitting: Gastroenterology

## 2021-04-01 ENCOUNTER — Other Ambulatory Visit: Payer: Self-pay

## 2021-04-01 ENCOUNTER — Encounter: Payer: Self-pay | Admitting: Gastroenterology

## 2021-04-01 VITALS — BP 115/88 | HR 61 | Temp 98.2°F | Resp 11 | Ht 65.0 in | Wt 175.0 lb

## 2021-04-01 DIAGNOSIS — D123 Benign neoplasm of transverse colon: Secondary | ICD-10-CM

## 2021-04-01 DIAGNOSIS — Z1211 Encounter for screening for malignant neoplasm of colon: Secondary | ICD-10-CM

## 2021-04-01 DIAGNOSIS — K635 Polyp of colon: Secondary | ICD-10-CM

## 2021-04-01 LAB — COLOGUARD: Cologuard: NEGATIVE

## 2021-04-01 MED ORDER — SODIUM CHLORIDE 0.9 % IV SOLN: 500.0000 mL | Freq: Once | INTRAVENOUS | Status: DC

## 2021-04-01 NOTE — Progress Notes (Signed)
VS completed by DT.  Pt's states no medical or surgical changes since previsit or office visit.  

## 2021-04-01 NOTE — Op Note (Signed)
[place] ?Patient Name: [name] ?Procedure Date: 04/01/2021 11:28 AM ?MRN: [id number] ?Endoscopist: Estill Cotta. Loletha Carrow , MD ?Age: 66 ?Referring MD:  ?Date of Birth: July 19, 1955 ?Gender: Male ?Account #: [id number] ?Procedure:                Colonoscopy ?Indications:              Screening for colorectal malignant neoplasm ?                          Patient reports no polyps on last colon[place]  [place]                          [place] ?Medicines:                Monitored Anesthesia Care ?Procedure:                Pre-Anesthesia Assessment: ?                          - Prior to the procedure, a History and Physical  ?                          was performed, and patient medications and  ?                          allergies were reviewed. The patient's tolerance of  ?                          previous anesthesia was also reviewed. The risks  ?                          and benefits of the procedure and the sedation  ?                          options and risks were discussed with the patient.  ?                          All questions were answered, and informed consent  ?                          was obtained. Prior Anticoagulants: The patient has  ?                          taken no previous anticoagulant or antiplatelet  ?                          agents. ASA Grade Assessment: II - A patient with  ?                          mild systemic disease. After reviewing the risks  ?                          and benefits, the patient was deemed in  ?  satisfactory condition to undergo the procedure. ?                          After obtaining informed consent, the colonoscope  ?                          was passed under direct vision. Throughout the  ?                          procedure, the patient's blood pressure, pulse, and  ?                          oxygen saturations were monitored continuously. The  ?                          CF [id number] #[id number] was introduced through  the anus  ?                          and advanced to the the cecum, identified by  ?                          appendiceal orifice and ileocecal valve. The  ?                          colonoscopy was performed without difficulty. The  ?                          patient tolerated the procedure well. The quality  ?                          of the bowel preparation was good except the cecum  ?                          and proximal ascending colon were fair (fibrous  ?                          debris((probable seeds)) and adjacent opaque fluid  ?                          that could not be cleared). The ileocecal valve,  ?                          appendiceal orifice, and rectum were photographed.  ?                          The bowel preparation used was SUPREP. ?Scope In: 11:38:26 AM ?Scope Out: 11:54:57 AM ?Scope Withdrawal Time: 0 hours 13 minutes 57 seconds  ?Total Procedure Duration: 0 hours 16 minutes 31 seconds  ?Findings:                 The perianal and digital rectal examinations were  ?                          normal. ?  Repeat examination of right colon under NBI  ?                          performed. ?                          A diminutive polyp was found in the transverse  ?                          colon. The polyp was semi-sessile. The polyp was  ?                          removed with a cold snare. Resection and retrieval  ?                          were complete. ?                          Multiple diverticula were found in the left colon. ?                          Retroflexion in the rectum was not performed due to  ?                          narrow anatomy. ?                          Internal hemorrhoids were found. ?Complications:            No immediate complications. ?Estimated Blood Loss:     Estimated blood loss: none. Estimated blood loss  ?                          was minimal. ?Impression:               - One diminutive polyp in the transverse colon,  ?                           removed with a cold snare. Resected and retrieved. ?                          - Diverticulosis in the left colon. ?                          - Internal hemorrhoids. ?Recommendation:           - Patient has a contact number available for  ?                          emergencies. The signs and symptoms of potential  ?                          delayed complications were discussed with the  ?                          patient. Return to normal activities tomorrow.  ?  Written discharge instructions were provided to the  ?                          patient. ?                          - Resume previous diet. ?                          - Continue present medications. ?                          - Await pathology results. ?                          - Repeat colonoscopy in 1 year because the bowel  ?                          preparation was suboptimal in the proximal right  ?                          colon (see description above). For next  ?                          colonoscopy, consume more water and closer  ?                          attention to pre-procedure dietary restrictions  ?                          regarding fibrous foods. ?Massiah Longanecker L. Loletha Carrow, MD ?04/01/2021 12:02:58 PM ?This report has been signed electronically. ?

## 2021-04-01 NOTE — Progress Notes (Signed)
Called to room to assist during endoscopic procedure.  Patient ID and intended procedure confirmed with present staff. Received instructions for my participation in the procedure from the performing physician.  

## 2021-04-01 NOTE — Patient Instructions (Signed)
Handouts Provided:  Polyps and Diverticulosis ° °YOU HAD AN ENDOSCOPIC PROCEDURE TODAY AT THE [facility]:   Refer to the procedure report that was given to you for any specific questions about what was found during the examination.  If the procedure report does not answer your questions, please call your gastroenterologist to clarify.  If you requested that your care partner not be given the details of your procedure findings, then the procedure report has been included in a sealed envelope for you to review at your convenience later. ° °YOU SHOULD EXPECT: Some feelings of bloating in the abdomen. Passage of more gas than usual.  Walking can help get rid of the air that was put into your GI tract during the procedure and reduce the bloating. If you had a lower endoscopy (such as a colonoscopy or flexible sigmoidoscopy) you may notice spotting of blood in your stool or on the toilet paper. If you underwent a bowel prep for your procedure, you may not have a normal bowel movement for a few days. ° °Please Note:  You might notice some irritation and congestion in your nose or some drainage.  This is from the oxygen used during your procedure.  There is no need for concern and it should clear up in a day or so. ° °SYMPTOMS TO REPORT IMMEDIATELY: ° °Following lower endoscopy (colonoscopy or flexible sigmoidoscopy): ° Excessive amounts of blood in the stool ° Significant tenderness or worsening of abdominal pains ° Swelling of the abdomen that is new, acute ° Fever of 100°F or higher ° °For urgent or emergent issues, a gastroenterologist can be reached at any hour by calling [phone]. °Do not use MyChart messaging for urgent concerns.  ° ° °DIET:  We do recommend a small meal at first, but then you may proceed to your regular diet.  Drink plenty of fluids but you should avoid alcoholic beverages for 24 hours. ° °ACTIVITY:  You should plan to take it easy for the rest of today and you should NOT DRIVE  or use heavy machinery until tomorrow (because of the sedation medicines used during the test).   ° °FOLLOW UP: °Our staff will call the number listed on your records 48-72 hours following your procedure to check on you and address any questions or concerns that you may have regarding the information given to you following your procedure. If we do not reach you, we will leave a message.  We will attempt to reach you two times.  During this call, we will ask if you have developed any symptoms of COVID 19. If you develop any symptoms (ie: fever, flu-like symptoms, shortness of breath, cough etc.) before then, please call [phone].  If you test positive for Covid 19 in the 2 weeks post procedure, please call and report this information to us.   ° °If any biopsies were taken you will be contacted by phone or by letter within the next 1-3 weeks.  Please call us at [phone] if you have not heard about the biopsies in 3 weeks.  ° ° °SIGNATURES/CONFIDENTIALITY: °You and/or your care partner have signed paperwork which will be entered into your electronic medical record.  These signatures attest to the fact that that the information above on your After Visit Summary has been reviewed and is understood.  Full responsibility of the confidentiality of this discharge information lies with you and/or your care-partner. ° °

## 2021-04-01 NOTE — Progress Notes (Signed)
History and Physical: ? This patient presents for endoscopic testing for: ?Encounter Diagnosis  ?Name Primary?  ? Special screening for malignant neoplasms, colon Yes  ? ? ?Reports no polyps last colonoscopy in [place] over 10 years ago ?Patient denies chronic abdominal pain, rectal bleeding, constipation or diarrhea. ? ? ?ROS: ?Patient denies chest pain or shortness of breath ? ? ?Past Medical History: ?Past Medical History:  ?Diagnosis Date  ? Cancer [facility])   ? Cataract   ? Hyperlipidemia   ? Hypertension   ? ? ? ?Past Surgical History: ?Past Surgical History:  ?Procedure Laterality Date  ? APPENDECTOMY    ? CYSTOSCOPY W/ RETROGRADES Right 11/04/2015  ? Procedure: CYSTOSCOPY WITH RETROGRADE PYELOGRAM;  Surgeon: Royston Cowper, MD;  Location: [facility];  Service: Urology;  Laterality: Right;  ? [place] N/A 08/19/2015  ? Procedure: CYSTOSCOPY WITH FULGERATION / CLOT EVACUATION;  Surgeon: Royston Cowper, MD;  Location: [facility];  Service: Urology;  Laterality: N/A;  ? CYSTOSCOPY WITH FULGERATION N/A 09/14/2015  ? Procedure: CYSTOSCOPY WITH clot evacuation;  Surgeon: Darcella Cheshire, MD;  Location: [facility];  Service: Urology;  Laterality: N/A;  ? CYSTOSCOPY WITH FULGERATION N/A 09/23/2015  ? Procedure: CYSTOSCOPY WITH FULGERATION;  Surgeon: Royston Cowper, MD;  Location: [facility];  Service: Urology;  Laterality: N/A;  ? HOLMIUM LASER APPLICATION N/A 07/10/2374  ? Procedure: HOLMIUM LASER APPLICATION;  Surgeon: Royston Cowper, MD;  Location: [facility];  Service: Urology;  Laterality: N/A;  ? TRANSURETHRAL RESECTION OF PROSTATE N/A 09/23/2015  ? Procedure: TRANSURETHRAL RESECTION OF THE PROSTATE (TURP);  Surgeon: Royston Cowper, MD;  Location: [facility];  Service: Urology;  Laterality: N/A;  ? URETEROSCOPY Right 11/04/2015  ? Procedure: URETEROSCOPY;  Surgeon: Royston Cowper, MD;  Location: [facility];  Service: Urology;  Laterality: Right;  ? URETHROTOMY N/A 01/24/2018  ? Procedure: CYSTOSCOPY- INTERNAL  URETHROTOMY WITH HOLMIUM LASER;  Surgeon: Royston Cowper, MD;  Location: [facility];  Service: Urology;  Laterality: N/A;  ? ? ?Allergies: ?No Known Allergies ? ?Outpatient Meds: ?Current Outpatient Medications  ?Medication Sig Dispense Refill  ? Ascorbic Acid (VITAMIN C) 1000 MG tablet Take 2,000 mg by mouth daily.    ? atorvastatin (LIPITOR) 20 MG tablet TAKE 1 TABLET BY MOUTH EVERY DAY 90 tablet 1  ? B Complex-C (B-COMPLEX WITH VITAMIN C) tablet Take 1 tablet by mouth daily.    ? diltiazem (MATZIM LA) 360 MG 24 hr tablet TAKE 1 TABLET BY MOUTH DAILY. 90 tablet 1  ? lisinopril (ZESTRIL) 40 MG tablet TAKE 1 TABLET BY MOUTH EVERY DAY 90 tablet 1  ? metoprolol succinate (TOPROL-XL) 100 MG 24 hr tablet Take 1 tablet (100 mg total) by mouth daily. Take with or immediately following a meal. 90 tablet 3  ? Multiple Vitamin (MULTIVITAMIN) tablet Take 1 tablet by mouth daily.    ? tadalafil (CIALIS) 5 MG tablet Take 5 mg by mouth daily.    ? ?Current Facility-Administered Medications  ?Medication Dose Route Frequency Provider Last Rate Last Admin  ? 0.9 %  sodium chloride infusion  500 mL Intravenous Once Doran Stabler, MD      ? ? ? ? ?___________________________________________________________________ ?Objective  ? ?Exam: ? ?BP (!) 193/91   Pulse 64   Temp 98.2 ?F (36.8 ?C) (Temporal)   Resp 18   Ht '5\' 5"'$  (1.651 m)   Wt 175 lb (79.4 kg)   SpO2 99%   BMI 29.12 kg/m?  ? ?  CV: RRR without murmur, S1/S2 ?Resp: clear to auscultation bilaterally, normal RR and effort noted ?GI: soft, no tenderness, with active bowel sounds. ? ? ?Assessment: ?Encounter Diagnosis  ?Name Primary?  ? Special screening for malignant neoplasms, colon Yes  ? ? ? ?Plan: ?Colonoscopy ? The benefits and risks of the planned procedure were described in detail with the patient or (when appropriate) their health care proxy.  Risks were outlined as including, but not limited to, bleeding, infection, perforation, adverse medication reaction  leading to cardiac or pulmonary decompensation, pancreatitis (if ERCP).  The limitation of incomplete mucosal visualization was also discussed.  No guarantees or warranties were given. ? ? ? ?The patient is appropriate for an endoscopic procedure in the ambulatory setting. ? ? - Wilfrid Lund, MD ? ? ? ? ?

## 2021-04-01 NOTE — Progress Notes (Signed)
Report given to PACU, vss 

## 2021-04-03 ENCOUNTER — Telehealth: Payer: Self-pay | Admitting: *Deleted

## 2021-04-03 ENCOUNTER — Telehealth: Payer: Self-pay

## 2021-04-03 NOTE — Telephone Encounter (Signed)
Follow up call placed, VM obtained and message left. ?SChaplin, RN,BSN ? ?

## 2021-04-03 NOTE — Telephone Encounter (Signed)
Patient returned call stated that he is doing well. ?

## 2021-04-03 NOTE — Telephone Encounter (Signed)
?  Follow up Call- ? ? ?  04/01/2021  ? 10:47 AM  ?Call back number  ?Post procedure Call Back phone  # [phone]  ?Permission to leave phone message Yes  ?  ?No answer at 2nd attempt follow up phone call.  Left message on voicemail.   ?

## 2021-04-08 ENCOUNTER — Encounter: Payer: Self-pay | Admitting: Gastroenterology

## 2021-04-21 ENCOUNTER — Encounter: Payer: Self-pay | Admitting: Family Medicine

## 2021-04-21 ENCOUNTER — Ambulatory Visit (INDEPENDENT_AMBULATORY_CARE_PROVIDER_SITE_OTHER): Payer: Medicare Other | Admitting: Family Medicine

## 2021-04-21 VITALS — BP 136/80 | HR 77 | Temp 97.9°F | Resp 17 | Ht 65.0 in | Wt 176.2 lb

## 2021-04-21 DIAGNOSIS — I10 Essential (primary) hypertension: Secondary | ICD-10-CM

## 2021-04-21 DIAGNOSIS — E663 Overweight: Secondary | ICD-10-CM | POA: Diagnosis not present

## 2021-04-21 DIAGNOSIS — E785 Hyperlipidemia, unspecified: Secondary | ICD-10-CM | POA: Diagnosis not present

## 2021-04-21 LAB — HEPATIC FUNCTION PANEL
ALT: 28 U/L (ref 0–53)
AST: 25 U/L (ref 0–37)
Albumin: 4.8 g/dL (ref 3.5–5.2)
Alkaline Phosphatase: 51 U/L (ref 39–117)
Bilirubin, Direct: 0.2 mg/dL (ref 0.0–0.3)
Total Bilirubin: 1.1 mg/dL (ref 0.2–1.2)
Total Protein: 8 g/dL (ref 6.0–8.3)

## 2021-04-21 LAB — BASIC METABOLIC PANEL
BUN: 22 mg/dL (ref 6–23)
CO2: 28 mEq/L (ref 19–32)
Calcium: 9.8 mg/dL (ref 8.4–10.5)
Chloride: 104 mEq/L (ref 96–112)
Creatinine, Ser: 1.09 mg/dL (ref 0.40–1.50)
GFR: 70.14 mL/min (ref >60.00)
Glucose, Bld: 90 mg/dL (ref 70–99)
Potassium: 4.6 mEq/L (ref 3.5–5.1)
Sodium: 142 mEq/L (ref 135–145)

## 2021-04-21 LAB — CBC WITH DIFFERENTIAL/PLATELET
Basophils Absolute: 0 10*3/uL (ref 0.0–0.1)
Basophils Relative: 0.9 % (ref 0.0–3.0)
Eosinophils Absolute: 0.1 10*3/uL (ref 0.0–0.7)
Eosinophils Relative: 1.3 % (ref 0.0–5.0)
HCT: 42.1 % (ref 39.0–52.0)
Hemoglobin: 14.4 g/dL (ref 13.0–17.0)
Lymphocytes Relative: 22.3 % (ref 12.0–46.0)
Lymphs Abs: 1.2 10*3/uL (ref 0.7–4.0)
MCHC: 34.1 g/dL (ref 30.0–36.0)
MCV: 95.9 fl (ref 78.0–100.0)
Monocytes Absolute: 0.5 10*3/uL (ref 0.1–1.0)
Monocytes Relative: 10.2 % (ref 3.0–12.0)
Neutro Abs: 3.4 10*3/uL (ref 1.4–7.7)
Neutrophils Relative %: 65.3 % (ref 43.0–77.0)
Platelets: 253 10*3/uL (ref 150.0–400.0)
RBC: 4.39 Mil/uL (ref 4.22–5.81)
RDW: 13.6 % (ref 11.5–15.5)
WBC: 5.2 10*3/uL (ref 4.0–10.5)

## 2021-04-21 LAB — LIPID PANEL
Cholesterol: 152 mg/dL (ref 0–200)
HDL: 50.3 mg/dL (ref >39.00)
LDL Cholesterol: 86 mg/dL (ref 0–99)
NonHDL: 101.53
Total CHOL/HDL Ratio: 3
Triglycerides: 78 mg/dL (ref 0.0–149.0)
VLDL: 15.6 mg/dL (ref 0.0–40.0)

## 2021-04-21 LAB — TSH: TSH: 1.66 u[IU]/mL (ref 0.35–5.50)

## 2021-04-21 MED ORDER — METOPROLOL SUCCINATE ER 50 MG PO TB24: 50.0000 mg | ORAL_TABLET | Freq: Every day | ORAL | 1 refills | Status: DC

## 2021-04-21 NOTE — Assessment & Plan Note (Signed)
Chronic problem.  On Lipitor 20mg daily w/o difficulty.  Check labs.  Adjust meds prn  

## 2021-04-21 NOTE — Assessment & Plan Note (Signed)
Pt has gained 7 lbs in the last year.  He is back to exercising regularly and getting 15-20,000 steps daily.  Applauded his efforts.  Will follow. ?

## 2021-04-21 NOTE — Progress Notes (Signed)
? ?  Subjective:  ? ? Patient ID: [name], male    DOB: 1955-08-01, 66 y.o.   MRN: [id number] ? ?HPI ?HTN- chronic problem, on Diltiazem '360mg'$  daily, Lisinopril '40mg'$  daily, Metoprolol XL '100mg'$  daily w/ adequate control.  Yesterday was 125/64.  BP was elevated during colonoscopy but this is not unexpected.  After the colonoscopy he added an additional '25mg'$  Metoprolol to better control BP.  No CP, SOB, HAs, visual changes, edema. ? ?Hyperlipidemia- chronic problem, on Lipitor '20mg'$  daily.  No abd pain, N/V. ? ?Overweight- pt's BMI is 29.32  He is up 7 lbs in the last year.  Pt is exercising regularly-  ? ? ?Review of Systems ?For ROS see HPI  ?   ?Objective:  ? Physical Exam ?Vitals reviewed.  ?Constitutional:   ?   General: He is not in acute distress. ?   Appearance: Normal appearance. He is well-developed. He is not ill-appearing.  ?HENT:  ?   Head: Normocephalic and atraumatic.  ?Eyes:  ?   Extraocular Movements: Extraocular movements intact.  ?   Conjunctiva/sclera: Conjunctivae normal.  ?   Pupils: Pupils are equal, round, and reactive to light.  ?Neck:  ?   Thyroid: No thyromegaly.  ?Cardiovascular:  ?   Rate and Rhythm: Normal rate and regular rhythm.  ?   Pulses: Normal pulses.  ?   Heart sounds: Normal heart sounds. No murmur heard. ?Pulmonary:  ?   Effort: Pulmonary effort is normal. No respiratory distress.  ?   Breath sounds: Normal breath sounds.  ?Abdominal:  ?   General: Bowel sounds are normal. There is no distension.  ?   Palpations: Abdomen is soft.  ?Musculoskeletal:  ?   Cervical back: Normal range of motion and neck supple.  ?   Right lower leg: No edema.  ?   Left lower leg: No edema.  ?Lymphadenopathy:  ?   Cervical: No cervical adenopathy.  ?Skin: ?   General: Skin is warm and dry.  ?Neurological:  ?   General: No focal deficit present.  ?   Mental Status: He is alert and oriented to person, place, and time.  ?   Cranial Nerves: No cranial nerve deficit.  ?Psychiatric:     ?   Mood and  Affect: Mood normal.     ?   Behavior: Behavior normal.  ? ? ? ? ? ?   ?Assessment & Plan:  ? ? ?

## 2021-04-21 NOTE — Assessment & Plan Note (Signed)
Chronic problem.  Pt had been well controlled on Dilt '360mg'$  daily, Lisinopril '40mg'$  daily, and Metoprolol '100mg'$  daily but found BP was elevated during colonoscopy.  I told him this is not unusual given that it's a stressful situation.  He had extra Metoprolol XL '50mg'$  daily available and started adding a 1/2 tab daily.  His BP at home is now excellently controlled and he is asymptomatic.  Will send in prescription for additional Metoprolol. ?

## 2021-04-21 NOTE — Patient Instructions (Signed)
Follow up in 6 months to recheck BP and cholesterol We'll notify you of your lab results and make any changes if needed Continue to work on healthy diet and regular exercise- you're doing great! Call with any questions or concerns Stay Safe!  Stay Healthy! Happy Spring!!! 

## 2021-05-21 ENCOUNTER — Encounter: Payer: Self-pay | Admitting: Family Medicine

## 2021-05-24 ENCOUNTER — Other Ambulatory Visit: Payer: Self-pay | Admitting: Family Medicine

## 2021-06-26 ENCOUNTER — Telehealth: Payer: Self-pay | Admitting: Family Medicine

## 2021-06-26 ENCOUNTER — Encounter: Payer: Self-pay | Admitting: Family Medicine

## 2021-08-13 ENCOUNTER — Telehealth: Payer: Self-pay | Admitting: Family Medicine

## 2021-08-13 NOTE — Telephone Encounter (Signed)
Left message for patient to call back and schedule Medicare Annual Wellness Visit (AWV).   Please offer to do virtually or by telephone.  Left office number and my jabber [phone].  AWVI eligible as of 08/05/2019  Please schedule at anytime with Nurse Health Advisor.

## 2021-09-10 ENCOUNTER — Telehealth: Payer: Self-pay | Admitting: Family Medicine

## 2021-09-10 NOTE — Telephone Encounter (Signed)
Left message for patient to call back and schedule Medicare Annual Wellness Visit (AWV).   Please offer to do virtually or by telephone.  Left office number and my jabber [phone].  AWVI eligible as of 08/05/2019   Please schedule at anytime with Nurse Health Advisor.

## 2021-09-17 ENCOUNTER — Other Ambulatory Visit: Payer: Self-pay | Admitting: Family Medicine

## 2021-09-23 ENCOUNTER — Telehealth: Payer: Self-pay | Admitting: Family Medicine

## 2021-09-23 NOTE — Telephone Encounter (Signed)
Left message for patient to call back and schedule Medicare Annual Wellness Visit (AWV).   Please offer to do virtually or by telephone.  Left office number and my jabber [phone].  AWVI eligible as of 08/05/2019  Please schedule at anytime with Nurse Health Advisor.

## 2021-10-21 ENCOUNTER — Ambulatory Visit: Payer: Medicare Other | Admitting: Family Medicine

## 2021-10-26 ENCOUNTER — Ambulatory Visit (INDEPENDENT_AMBULATORY_CARE_PROVIDER_SITE_OTHER): Payer: Medicare Other | Admitting: Family Medicine

## 2021-10-26 ENCOUNTER — Encounter: Payer: Self-pay | Admitting: Family Medicine

## 2021-10-26 VITALS — BP 138/84 | HR 66 | Temp 98.1°F | Resp 16 | Ht 65.0 in | Wt 174.0 lb

## 2021-10-26 DIAGNOSIS — I10 Essential (primary) hypertension: Secondary | ICD-10-CM

## 2021-10-26 DIAGNOSIS — E663 Overweight: Secondary | ICD-10-CM

## 2021-10-26 DIAGNOSIS — Z23 Encounter for immunization: Secondary | ICD-10-CM | POA: Diagnosis not present

## 2021-10-26 DIAGNOSIS — E785 Hyperlipidemia, unspecified: Secondary | ICD-10-CM

## 2021-10-26 LAB — CBC WITH DIFFERENTIAL/PLATELET
Basophils Absolute: 0 10*3/uL (ref 0.0–0.1)
Basophils Relative: 0.7 % (ref 0.0–3.0)
Eosinophils Absolute: 0.1 10*3/uL (ref 0.0–0.7)
Eosinophils Relative: 1.6 % (ref 0.0–5.0)
HCT: 42 % (ref 39.0–52.0)
Hemoglobin: 14.3 g/dL (ref 13.0–17.0)
Lymphocytes Relative: 24.2 % (ref 12.0–46.0)
Lymphs Abs: 1.2 10*3/uL (ref 0.7–4.0)
MCHC: 34.1 g/dL (ref 30.0–36.0)
MCV: 96 fl (ref 78.0–100.0)
Monocytes Absolute: 0.5 10*3/uL (ref 0.1–1.0)
Monocytes Relative: 10.6 % (ref 3.0–12.0)
Neutro Abs: 3.2 10*3/uL (ref 1.4–7.7)
Neutrophils Relative %: 62.9 % (ref 43.0–77.0)
Platelets: 234 10*3/uL (ref 150.0–400.0)
RBC: 4.38 Mil/uL (ref 4.22–5.81)
RDW: 13.5 % (ref 11.5–15.5)
WBC: 5 10*3/uL (ref 4.0–10.5)

## 2021-10-26 LAB — LIPID PANEL
Cholesterol: 153 mg/dL (ref 0–200)
HDL: 45.8 mg/dL (ref >39.00)
LDL Cholesterol: 90 mg/dL (ref 0–99)
NonHDL: 107.44
Total CHOL/HDL Ratio: 3
Triglycerides: 88 mg/dL (ref 0.0–149.0)
VLDL: 17.6 mg/dL (ref 0.0–40.0)

## 2021-10-26 LAB — HEPATIC FUNCTION PANEL
ALT: 27 U/L (ref 0–53)
AST: 27 U/L (ref 0–37)
Albumin: 4.7 g/dL (ref 3.5–5.2)
Alkaline Phosphatase: 44 U/L (ref 39–117)
Bilirubin, Direct: 0.2 mg/dL (ref 0.0–0.3)
Total Bilirubin: 0.8 mg/dL (ref 0.2–1.2)
Total Protein: 8.2 g/dL (ref 6.0–8.3)

## 2021-10-26 LAB — BASIC METABOLIC PANEL
BUN: 28 mg/dL — ABNORMAL HIGH (ref 6–23)
CO2: 29 mEq/L (ref 19–32)
Calcium: 10 mg/dL (ref 8.4–10.5)
Chloride: 102 mEq/L (ref 96–112)
Creatinine, Ser: 1.22 mg/dL (ref 0.40–1.50)
GFR: 61.05 mL/min (ref >60.00)
Glucose, Bld: 103 mg/dL — ABNORMAL HIGH (ref 70–99)
Potassium: 4.4 mEq/L (ref 3.5–5.1)
Sodium: 139 mEq/L (ref 135–145)

## 2021-10-26 LAB — TSH: TSH: 1.84 u[IU]/mL (ref 0.35–5.50)

## 2021-10-26 NOTE — Assessment & Plan Note (Signed)
Pt is down 2 lbs since last visit.  BMI is stable at 28.96  Encouraged healthy diet and in addition to his regular exercise.  Will follow.

## 2021-10-26 NOTE — Progress Notes (Signed)
   Subjective:    Patient ID: [name], male    DOB: Dec 11, 1955, 66 y.o.   MRN: [id number]  HPI HTN- chronic problem, on Diltiazem '360mg'$  daily, Lisinopril '40mg'$  daily, and Metoprolol XL '150mg'$  daily w/ adequate control.  No CP, SOB, HAs, visual changes, edema.  Hyperlipidemia- chronic problem, on Lipitor '20mg'$  daily.  No abd pain, N/V.  Overweight- Pt is down 2 lbs since last visit.  BMI 28.96  continues to exercise regularly- 20,000 steps daily   Review of Systems For ROS see HPI     Objective:   Physical Exam Vitals reviewed.  Constitutional:      General: He is not in acute distress.    Appearance: Normal appearance. He is well-developed. He is not ill-appearing.  HENT:     Head: Normocephalic and atraumatic.  Eyes:     Extraocular Movements: Extraocular movements intact.     Conjunctiva/sclera: Conjunctivae normal.     Pupils: Pupils are equal, round, and reactive to light.  Neck:     Thyroid: No thyromegaly.  Cardiovascular:     Rate and Rhythm: Normal rate and regular rhythm.     Pulses: Normal pulses.     Heart sounds: Normal heart sounds. No murmur heard. Pulmonary:     Effort: Pulmonary effort is normal. No respiratory distress.     Breath sounds: Normal breath sounds.  Abdominal:     General: Bowel sounds are normal. There is no distension.     Palpations: Abdomen is soft.  Musculoskeletal:     Cervical back: Normal range of motion and neck supple.     Right lower leg: No edema.     Left lower leg: No edema.  Lymphadenopathy:     Cervical: No cervical adenopathy.  Skin:    General: Skin is warm and dry.  Neurological:     General: No focal deficit present.     Mental Status: He is alert and oriented to person, place, and time.     Cranial Nerves: No cranial nerve deficit.  Psychiatric:        Mood and Affect: Mood normal.        Behavior: Behavior normal.           Assessment & Plan:

## 2021-10-26 NOTE — Assessment & Plan Note (Signed)
Chronic problem.  On Lipitor 20mg daily w/o difficulty.  Check labs.  Adjust meds prn  

## 2021-10-26 NOTE — Progress Notes (Signed)
Pt seen results Via my chart  

## 2021-10-26 NOTE — Assessment & Plan Note (Signed)
Chronic problem.  On Dilt '360mg'$  daily, Lisinopril '40mg'$  daily, and Metoprolol XL '150mg'$  daily w/ adequate control.  Currently asymptomatic.  Check labs due to ACE use but no anticipated med changes.  Will follow.

## 2021-10-26 NOTE — Patient Instructions (Signed)
Follow up in 6 months to recheck BP and cholesterol We'll notify you of your lab results and make any changes if needed Keep up the good work on healthy diet and regular exercise- you look great!! Call with any questions or concerns Stay Safe!  Stay Healthy! Happy Fall!!!

## 2021-10-28 ENCOUNTER — Ambulatory Visit: Payer: Medicare Other

## 2021-12-17 ENCOUNTER — Other Ambulatory Visit: Payer: Self-pay | Admitting: Family Medicine

## 2021-12-21 ENCOUNTER — Other Ambulatory Visit: Payer: Self-pay | Admitting: Family Medicine

## 2022-02-14 ENCOUNTER — Other Ambulatory Visit: Payer: Self-pay | Admitting: Family Medicine

## 2022-03-01 ENCOUNTER — Other Ambulatory Visit: Payer: Self-pay | Admitting: Family Medicine

## 2022-04-19 ENCOUNTER — Encounter: Payer: Self-pay | Admitting: Family Medicine

## 2022-04-19 ENCOUNTER — Ambulatory Visit (INDEPENDENT_AMBULATORY_CARE_PROVIDER_SITE_OTHER): Payer: Medicare Other | Admitting: Family Medicine

## 2022-04-19 VITALS — BP 150/100 | HR 73 | Temp 97.9°F | Resp 17 | Ht 65.0 in | Wt 171.2 lb

## 2022-04-19 DIAGNOSIS — C61 Malignant neoplasm of prostate: Secondary | ICD-10-CM

## 2022-04-19 DIAGNOSIS — E785 Hyperlipidemia, unspecified: Secondary | ICD-10-CM | POA: Diagnosis not present

## 2022-04-19 DIAGNOSIS — N3041 Irradiation cystitis with hematuria: Secondary | ICD-10-CM

## 2022-04-19 DIAGNOSIS — I10 Essential (primary) hypertension: Secondary | ICD-10-CM | POA: Diagnosis not present

## 2022-04-19 LAB — LIPID PANEL
Cholesterol: 144 mg/dL (ref 0–200)
HDL: 48.8 mg/dL (ref >39.00)
LDL Cholesterol: 78 mg/dL (ref 0–99)
NonHDL: 95.52
Total CHOL/HDL Ratio: 3
Triglycerides: 86 mg/dL (ref 0.0–149.0)
VLDL: 17.2 mg/dL (ref 0.0–40.0)

## 2022-04-19 LAB — HEPATIC FUNCTION PANEL
ALT: 51 U/L (ref 0–53)
AST: 40 U/L — ABNORMAL HIGH (ref 0–37)
Albumin: 5 g/dL (ref 3.5–5.2)
Alkaline Phosphatase: 53 U/L (ref 39–117)
Bilirubin, Direct: 0.1 mg/dL (ref 0.0–0.3)
Total Bilirubin: 0.8 mg/dL (ref 0.2–1.2)
Total Protein: 8.6 g/dL — ABNORMAL HIGH (ref 6.0–8.3)

## 2022-04-19 LAB — BASIC METABOLIC PANEL
BUN: 27 mg/dL — ABNORMAL HIGH (ref 6–23)
CO2: 26 mEq/L (ref 19–32)
Calcium: 10.4 mg/dL (ref 8.4–10.5)
Chloride: 101 mEq/L (ref 96–112)
Creatinine, Ser: 1.31 mg/dL (ref 0.40–1.50)
GFR: 55.86 mL/min — ABNORMAL LOW (ref >60.00)
Glucose, Bld: 102 mg/dL — ABNORMAL HIGH (ref 70–99)
Potassium: 4.6 mEq/L (ref 3.5–5.1)
Sodium: 138 mEq/L (ref 135–145)

## 2022-04-19 LAB — CBC WITH DIFFERENTIAL/PLATELET
Basophils Absolute: 0 10*3/uL (ref 0.0–0.1)
Basophils Relative: 0.5 % (ref 0.0–3.0)
Eosinophils Absolute: 0 10*3/uL (ref 0.0–0.7)
Eosinophils Relative: 0.7 % (ref 0.0–5.0)
HCT: 43.4 % (ref 39.0–52.0)
Hemoglobin: 15 g/dL (ref 13.0–17.0)
Lymphocytes Relative: 19.9 % (ref 12.0–46.0)
Lymphs Abs: 1.2 10*3/uL (ref 0.7–4.0)
MCHC: 34.6 g/dL (ref 30.0–36.0)
MCV: 96.2 fl (ref 78.0–100.0)
Monocytes Absolute: 0.5 10*3/uL (ref 0.1–1.0)
Monocytes Relative: 8.5 % (ref 3.0–12.0)
Neutro Abs: 4.4 10*3/uL (ref 1.4–7.7)
Neutrophils Relative %: 70.4 % (ref 43.0–77.0)
Platelets: 281 10*3/uL (ref 150.0–400.0)
RBC: 4.52 Mil/uL (ref 4.22–5.81)
RDW: 13.2 % (ref 11.5–15.5)
WBC: 6.3 10*3/uL (ref 4.0–10.5)

## 2022-04-19 LAB — TSH: TSH: 1.41 u[IU]/mL (ref 0.35–5.50)

## 2022-04-19 MED ORDER — TADALAFIL 5 MG PO TABS: 5.0000 mg | ORAL_TABLET | Freq: Every day | ORAL | 1 refills | Status: DC

## 2022-04-19 MED ORDER — ELMIRON 100 MG PO CAPS: 100.0000 mg | ORAL_CAPSULE | Freq: Three times a day (TID) | ORAL | 0 refills | Status: DC

## 2022-04-19 NOTE — Patient Instructions (Signed)
Follow up in 6 months to recheck BP and cholesterol We'll notify you of your lab results and make any changes if needed Keep up the good work on healthy diet and regular exercise- you look great! If BP starts running consistently in the 140s or higher, let me know Call with any questions or concerns Happy Spring!!!

## 2022-04-19 NOTE — Assessment & Plan Note (Signed)
Chronic problem.  Currently on Metoprolol XL 150mg  daily, Lisinopril 40mg  daily, and Dilt 360mg  daily.  Home BP's are well controlled and BP is always higher in office.  Currently asymptomatic.  Since he checks BP regularly and readings are good, will not adjust meds at this time.  Will follow.

## 2022-04-19 NOTE — Assessment & Plan Note (Signed)
Chronic problem.  Currently on Lipitor 20mg daily w/o difficulty.  Check labs.  Adjust meds prn  

## 2022-04-19 NOTE — Progress Notes (Signed)
   Subjective:    Patient ID: [name], male    DOB: 15-Oct-1955, 67 y.o.   MRN: [id number]  HPI HTN- chronic problem, on Metoprolol XL 150mg  daily, Lisinopril 40mg  daily, and Diltiazem 360mg  daily.  Home BP's typically run 120s-130s/80s.  No CP, SOB, HA's, visual changes, edema.  Hyperlipidemia- chronic problem, on Lipitor 20mg  daily.  No abd pain, N/V.  Continues to exercise regularly.  Prostate cancer- pt's urologist retired and he needs a temporary supply of Elmiron and Cialis until he finds a new office.   Review of Systems For ROS see HPI     Objective:   Physical Exam Vitals reviewed.  Constitutional:      General: He is not in acute distress.    Appearance: Normal appearance. He is well-developed. He is not ill-appearing.  HENT:     Head: Normocephalic and atraumatic.  Eyes:     Extraocular Movements: Extraocular movements intact.     Conjunctiva/sclera: Conjunctivae normal.     Pupils: Pupils are equal, round, and reactive to light.  Neck:     Thyroid: No thyromegaly.  Cardiovascular:     Rate and Rhythm: Normal rate and regular rhythm.     Pulses: Normal pulses.     Heart sounds: Normal heart sounds. No murmur heard. Pulmonary:     Effort: Pulmonary effort is normal. No respiratory distress.     Breath sounds: Normal breath sounds.  Abdominal:     General: Bowel sounds are normal. There is no distension.     Palpations: Abdomen is soft.  Musculoskeletal:     Cervical back: Normal range of motion and neck supple.     Right lower leg: No edema.     Left lower leg: No edema.  Lymphadenopathy:     Cervical: No cervical adenopathy.  Skin:    General: Skin is warm and dry.  Neurological:     General: No focal deficit present.     Mental Status: He is alert and oriented to person, place, and time.     Cranial Nerves: No cranial nerve deficit.  Psychiatric:        Mood and Affect: Mood normal.        Behavior: Behavior normal.           Assessment &  Plan:

## 2022-04-19 NOTE — Assessment & Plan Note (Signed)
Urologist retired and he needs referral.  Placed today.  Prescription provided for Elmiron and Cialis

## 2022-04-19 NOTE — Assessment & Plan Note (Signed)
Ongoing issue for pt.  On Elmiron 1-3x/day.  Prescription sent while he finds new urologist.

## 2022-05-11 ENCOUNTER — Telehealth: Payer: Self-pay

## 2022-05-11 NOTE — Telephone Encounter (Signed)
PA submitted via Cover My Meds 05/11/2022 for Cialis, will check status in 48 hours

## 2022-05-17 ENCOUNTER — Other Ambulatory Visit: Payer: Self-pay | Admitting: Family Medicine

## 2022-06-05 ENCOUNTER — Other Ambulatory Visit (HOSPITAL_COMMUNITY): Payer: Self-pay

## 2022-06-05 NOTE — Telephone Encounter (Signed)
Pharmacy Patient Advocate Encounter  Received notification from [facility] that the request for prior authorization for CIALIS(TADALAFIL 5MG  TABLET) has been denied due to INFORMATION SUBMITTED DID NOT MEET THE PLAN CRITERIA FOR MEDICAL NECESSITY.  the diagnosis of prostate cancer is considered an experimental or investigational use for this drug or product. Experimental or investigational indications are those which further studies or clinical trials are necessary to determine the maximum tolerated dose, toxicity, safety, or efficacy as compared with the standard means of treatment  Please be advised we currently do not have a Pharmacist to review denials, therefore you will need to process appeals accordingly as needed. Thanks for your support at this time.   You may call [phone] or fax [phone], to appeal.

## 2022-06-09 NOTE — Telephone Encounter (Signed)
I would have him discuss w/ his urologist if he's still seeing one

## 2022-06-09 NOTE — Telephone Encounter (Signed)
What would you like to do about this denial

## 2022-06-09 NOTE — Telephone Encounter (Signed)
Spoke with pt and he states he is looking to a private office for Urology. They referral we gave him he did not like the Doctor he seen. He does states he does not need [place] to do anything further.

## 2022-10-13 ENCOUNTER — Other Ambulatory Visit: Payer: Self-pay | Admitting: Family Medicine

## 2022-10-18 ENCOUNTER — Ambulatory Visit: Payer: Medicare Other | Admitting: Family Medicine

## 2022-10-21 ENCOUNTER — Encounter: Payer: Self-pay | Admitting: Family Medicine

## 2022-10-21 ENCOUNTER — Ambulatory Visit: Payer: Medicare Other | Admitting: Family Medicine

## 2022-10-21 ENCOUNTER — Telehealth: Payer: Self-pay

## 2022-10-21 VITALS — BP 138/82 | HR 74 | Temp 98.2°F | Ht 65.0 in | Wt 176.0 lb

## 2022-10-21 DIAGNOSIS — E785 Hyperlipidemia, unspecified: Secondary | ICD-10-CM | POA: Diagnosis not present

## 2022-10-21 DIAGNOSIS — C61 Malignant neoplasm of prostate: Secondary | ICD-10-CM

## 2022-10-21 DIAGNOSIS — E663 Overweight: Secondary | ICD-10-CM | POA: Diagnosis not present

## 2022-10-21 DIAGNOSIS — I10 Essential (primary) hypertension: Secondary | ICD-10-CM

## 2022-10-21 LAB — CBC WITH DIFFERENTIAL/PLATELET
Basophils Absolute: 0 10*3/uL (ref 0.0–0.1)
Basophils Relative: 0.9 % (ref 0.0–3.0)
Eosinophils Absolute: 0 10*3/uL (ref 0.0–0.7)
Eosinophils Relative: 0.7 % (ref 0.0–5.0)
HCT: 45.3 % (ref 39.0–52.0)
Hemoglobin: 15.1 g/dL (ref 13.0–17.0)
Lymphocytes Relative: 19.1 % (ref 12.0–46.0)
Lymphs Abs: 1.1 10*3/uL (ref 0.7–4.0)
MCHC: 33.3 g/dL (ref 30.0–36.0)
MCV: 97.7 fL (ref 78.0–100.0)
Monocytes Absolute: 0.5 10*3/uL (ref 0.1–1.0)
Monocytes Relative: 9.3 % (ref 3.0–12.0)
Neutro Abs: 3.9 10*3/uL (ref 1.4–7.7)
Neutrophils Relative %: 70 % (ref 43.0–77.0)
Platelets: 273 10*3/uL (ref 150.0–400.0)
RBC: 4.63 Mil/uL (ref 4.22–5.81)
RDW: 13.7 % (ref 11.5–15.5)
WBC: 5.6 10*3/uL (ref 4.0–10.5)

## 2022-10-21 LAB — LIPID PANEL
Cholesterol: 142 mg/dL (ref 0–200)
HDL: 56.4 mg/dL (ref >39.00)
LDL Cholesterol: 69 mg/dL (ref 0–99)
NonHDL: 85.22
Total CHOL/HDL Ratio: 3
Triglycerides: 79 mg/dL (ref 0.0–149.0)
VLDL: 15.8 mg/dL (ref 0.0–40.0)

## 2022-10-21 LAB — HEPATIC FUNCTION PANEL
ALT: 33 U/L (ref 0–53)
AST: 31 U/L (ref 0–37)
Albumin: 4.8 g/dL (ref 3.5–5.2)
Alkaline Phosphatase: 58 U/L (ref 39–117)
Bilirubin, Direct: 0.3 mg/dL (ref 0.0–0.3)
Total Bilirubin: 1.3 mg/dL — ABNORMAL HIGH (ref 0.2–1.2)
Total Protein: 8.3 g/dL (ref 6.0–8.3)

## 2022-10-21 LAB — BASIC METABOLIC PANEL
BUN: 14 mg/dL (ref 6–23)
CO2: 29 meq/L (ref 19–32)
Calcium: 10.3 mg/dL (ref 8.4–10.5)
Chloride: 99 meq/L (ref 96–112)
Creatinine, Ser: 0.98 mg/dL (ref 0.40–1.50)
GFR: 78.86 mL/min (ref >60.00)
Glucose, Bld: 99 mg/dL (ref 70–99)
Potassium: 5 meq/L (ref 3.5–5.1)
Sodium: 139 meq/L (ref 135–145)

## 2022-10-21 LAB — TSH: TSH: 1.52 u[IU]/mL (ref 0.35–5.50)

## 2022-10-21 LAB — PSA: PSA: 0 ng/mL — ABNORMAL LOW (ref 0.10–4.00)

## 2022-10-21 MED ORDER — ELMIRON 100 MG PO CAPS: 100.0000 mg | ORAL_CAPSULE | Freq: Every day | ORAL | 1 refills | Status: DC

## 2022-10-21 NOTE — Assessment & Plan Note (Signed)
Chronic problem.  Currently on Dilt 360mg  daily, Lisinopril 40mg  daily, and Metoprolol XL 50mg  daily.  Currently asymptomatic.  Check labs due to ACE use but no anticipated med changes.

## 2022-10-21 NOTE — Assessment & Plan Note (Signed)
Deteriorated.  Pt has gained 5 lbs since April.  Continues to walk and run regularly but admits he has not been as strict on his diet.  Will continue to follow.

## 2022-10-21 NOTE — Progress Notes (Signed)
Subjective:    Patient ID: [name], male    DOB: [birth year], 67 y.o.   MRN: [id number]  HPI HTN- chronic problem, on Dilt 360mg  daily, Lisinopril 40mg  daily, Metoprolol XL 50mg  daily.  No CP, SOB, HA's, visual changes, edema.  Hyperlipidemia- chronic problem, on Lipitor 20mg  daily.  No abd pain, N/V.  Overweight- pt has gained 5 lbs since April.  Continues to walk and run regularly.  Needs Cialis refill sent to [phone]    Review of Systems For ROS see HPI     Objective:   Physical Exam Vitals reviewed.  Constitutional:      General: He is not in acute distress.    Appearance: Normal appearance. He is well-developed. He is not ill-appearing.  HENT:     Head: Normocephalic and atraumatic.  Eyes:     Extraocular Movements: Extraocular movements intact.     Conjunctiva/sclera: Conjunctivae normal.     Pupils: Pupils are equal, round, and reactive to light.  Neck:     Thyroid: No thyromegaly.  Cardiovascular:     Rate and Rhythm: Normal rate and regular rhythm.     Pulses: Normal pulses.     Heart sounds: Normal heart sounds. No murmur heard. Pulmonary:     Effort: Pulmonary effort is normal. No respiratory distress.     Breath sounds: Normal breath sounds.  Abdominal:     General: Bowel sounds are normal. There is no distension.     Palpations: Abdomen is soft.  Musculoskeletal:     Cervical back: Normal range of motion and neck supple.     Right lower leg: No edema.     Left lower leg: No edema.  Lymphadenopathy:     Cervical: No cervical adenopathy.  Skin:    General: Skin is warm and dry.  Neurological:     General: No focal deficit present.     Mental Status: He is alert and oriented to person, place, and time.     Cranial Nerves: No cranial nerve deficit.  Psychiatric:        Mood and Affect: Mood normal.        Behavior: Behavior normal.           Assessment & Plan:

## 2022-10-21 NOTE — Patient Instructions (Signed)
Follow up in 6 months to recheck BP and cholesterol We'll notify you of your lab results and make any changes if needed Continue to work on healthy diet and regular exercise- you're doing great! Call with any questions or concerns Stay Safe!  Stay Healthy! Happy Fall!!!

## 2022-10-21 NOTE — Assessment & Plan Note (Signed)
Chronic problem.  Currently on Lipitor 20mg  daily w/o difficulty.  Check labs.  Adjust meds prn

## 2022-10-21 NOTE — Telephone Encounter (Signed)
Patient Cialis prescription did not go through as this was waiting on action from the patient himself that has now been completed they need the Cialis prescription re sent as a paper with the Order number [id number] written on it   Fax to [phone]

## 2022-10-21 NOTE — Assessment & Plan Note (Signed)
Pt wants to stop following w/ Urology.  Will get PSA today and attempt to refill his Cialis to [facility] at [phone]

## 2022-10-22 NOTE — Telephone Encounter (Signed)
Faxed

## 2022-10-22 NOTE — Telephone Encounter (Signed)
Prescription given to [facility] to fax

## 2022-11-16 ENCOUNTER — Encounter: Payer: Self-pay | Admitting: Internal Medicine

## 2022-12-20 ENCOUNTER — Other Ambulatory Visit: Payer: Self-pay | Admitting: Family Medicine

## 2022-12-22 ENCOUNTER — Encounter: Payer: Self-pay | Admitting: Family Medicine

## 2022-12-23 MED ORDER — ATORVASTATIN CALCIUM 40 MG PO TABS: 40.0000 mg | ORAL_TABLET | Freq: Every day | ORAL | 1 refills | Status: DC

## 2023-04-11 ENCOUNTER — Encounter: Payer: Self-pay | Admitting: Family Medicine

## 2023-04-11 ENCOUNTER — Ambulatory Visit (INDEPENDENT_AMBULATORY_CARE_PROVIDER_SITE_OTHER): Payer: Medicare Other | Admitting: Family Medicine

## 2023-04-11 VITALS — BP 192/94 | HR 89 | Temp 98.0°F | Ht 65.0 in | Wt 181.0 lb

## 2023-04-11 DIAGNOSIS — Z1159 Encounter for screening for other viral diseases: Secondary | ICD-10-CM

## 2023-04-11 DIAGNOSIS — E669 Obesity, unspecified: Secondary | ICD-10-CM

## 2023-04-11 DIAGNOSIS — I10 Essential (primary) hypertension: Secondary | ICD-10-CM | POA: Diagnosis not present

## 2023-04-11 DIAGNOSIS — E785 Hyperlipidemia, unspecified: Secondary | ICD-10-CM | POA: Diagnosis not present

## 2023-04-11 LAB — BASIC METABOLIC PANEL WITH GFR
BUN: 17 mg/dL (ref 6–23)
CO2: 26 meq/L (ref 19–32)
Calcium: 9.9 mg/dL (ref 8.4–10.5)
Chloride: 99 meq/L (ref 96–112)
Creatinine, Ser: 0.91 mg/dL (ref 0.40–1.50)
GFR: 85.91 mL/min (ref >60.00)
Glucose, Bld: 94 mg/dL (ref 70–99)
Potassium: 4.1 meq/L (ref 3.5–5.1)
Sodium: 139 meq/L (ref 135–145)

## 2023-04-11 LAB — CBC WITH DIFFERENTIAL/PLATELET
Basophils Absolute: 0 10*3/uL (ref 0.0–0.1)
Basophils Relative: 0.6 % (ref 0.0–3.0)
Eosinophils Absolute: 0 10*3/uL (ref 0.0–0.7)
Eosinophils Relative: 0.3 % (ref 0.0–5.0)
HCT: 43.5 % (ref 39.0–52.0)
Hemoglobin: 14.9 g/dL (ref 13.0–17.0)
Lymphocytes Relative: 14.8 % (ref 12.0–46.0)
Lymphs Abs: 0.9 10*3/uL (ref 0.7–4.0)
MCHC: 34.4 g/dL (ref 30.0–36.0)
MCV: 97.7 fl (ref 78.0–100.0)
Monocytes Absolute: 0.5 10*3/uL (ref 0.1–1.0)
Monocytes Relative: 8.4 % (ref 3.0–12.0)
Neutro Abs: 4.7 10*3/uL (ref 1.4–7.7)
Neutrophils Relative %: 75.9 % (ref 43.0–77.0)
Platelets: 246 10*3/uL (ref 150.0–400.0)
RBC: 4.45 Mil/uL (ref 4.22–5.81)
RDW: 13.5 % (ref 11.5–15.5)
WBC: 6.1 10*3/uL (ref 4.0–10.5)

## 2023-04-11 LAB — HEPATIC FUNCTION PANEL
ALT: 50 U/L (ref 0–53)
AST: 39 U/L — ABNORMAL HIGH (ref 0–37)
Albumin: 5 g/dL (ref 3.5–5.2)
Alkaline Phosphatase: 55 U/L (ref 39–117)
Bilirubin, Direct: 0.2 mg/dL (ref 0.0–0.3)
Total Bilirubin: 1.2 mg/dL (ref 0.2–1.2)
Total Protein: 8.5 g/dL — ABNORMAL HIGH (ref 6.0–8.3)

## 2023-04-11 LAB — LIPID PANEL
Cholesterol: 139 mg/dL (ref 0–200)
HDL: 55.7 mg/dL (ref >39.00)
LDL Cholesterol: 68 mg/dL (ref 0–99)
NonHDL: 83.61
Total CHOL/HDL Ratio: 3
Triglycerides: 78 mg/dL (ref 0.0–149.0)
VLDL: 15.6 mg/dL (ref 0.0–40.0)

## 2023-04-11 LAB — TSH: TSH: 1.65 u[IU]/mL (ref 0.35–5.50)

## 2023-04-11 MED ORDER — AMLODIPINE BESYLATE 5 MG PO TABS: 5.0000 mg | ORAL_TABLET | Freq: Every day | ORAL | 1 refills | Status: DC

## 2023-04-11 MED ORDER — DILTIAZEM HCL ER 360 MG PO TB24: 360.0000 mg | ORAL_TABLET | Freq: Every day | ORAL | 1 refills | Status: DC

## 2023-04-11 MED ORDER — ELMIRON 100 MG PO CAPS: 100.0000 mg | ORAL_CAPSULE | Freq: Every day | ORAL | 1 refills | Status: AC

## 2023-04-11 NOTE — Telephone Encounter (Signed)
 Pt provided BP reading

## 2023-04-11 NOTE — Assessment & Plan Note (Signed)
Chronic problem, on Lipitor 40mg daily w/o difficulty.  Check labs.  Adjust meds prn  

## 2023-04-11 NOTE — Progress Notes (Signed)
   Subjective:    Patient ID: [name], male    DOB: 1955/10/01, 69 y.o.   MRN: [id number]  HPI HTN- chronic problem, on Dilt 360mg  daily, Lisinopril 40mg  daily, Metoprolol XL 150mg  daily.  Initial BP is quite high at 194/98.  Pt reports when he does his usual diet and exercise, BP is much better.  Has been in [place] x2 months which has changed his whole routine.  No CP, SOB, HA's, visual changes, edema.  Hyperlipidemia- chronic problem, on Lipitor 40mg  daily.  No abd pain, N/V.  Obesity- pt has gained 5 lbs since October.  Current BMI is 30.12.  no regular exercise since he was in [place] x2 months.   Review of Systems For ROS see HPI     Objective:   Physical Exam Vitals reviewed.  Constitutional:      General: He is not in acute distress.    Appearance: Normal appearance. He is well-developed. He is not ill-appearing.  HENT:     Head: Normocephalic and atraumatic.  Eyes:     Extraocular Movements: Extraocular movements intact.     Conjunctiva/sclera: Conjunctivae normal.     Pupils: Pupils are equal, round, and reactive to light.  Neck:     Thyroid: No thyromegaly.  Cardiovascular:     Rate and Rhythm: Normal rate and regular rhythm.     Pulses: Normal pulses.     Heart sounds: Normal heart sounds. No murmur heard. Pulmonary:     Effort: Pulmonary effort is normal. No respiratory distress.     Breath sounds: Normal breath sounds.  Abdominal:     General: Bowel sounds are normal. There is no distension.     Palpations: Abdomen is soft.  Musculoskeletal:     Cervical back: Normal range of motion and neck supple.     Right lower leg: No edema.     Left lower leg: No edema.  Lymphadenopathy:     Cervical: No cervical adenopathy.  Skin:    General: Skin is warm and dry.  Neurological:     General: No focal deficit present.     Mental Status: He is alert and oriented to person, place, and time.     Cranial Nerves: No cranial nerve deficit.  Psychiatric:        Mood and  Affect: Mood normal.        Behavior: Behavior normal.           Assessment & Plan:

## 2023-04-11 NOTE — Patient Instructions (Signed)
 Follow up in 1 month to recheck BP We'll notify you of your lab results and make any changes if needed ADD the Amlodipine daily Continue to work on healthy diet and regular exercise- you can do it! Call with any questions or concerns Stay Safe!  Stay Healthy! Happy Spring!!

## 2023-04-11 NOTE — Assessment & Plan Note (Addendum)
 Deteriorated.  Pt has gained 5 lbs since October.  BMI now 30.12  No recent exercise and not following particular diet.  Stressed need to get back on track.  Will follow.

## 2023-04-11 NOTE — Assessment & Plan Note (Signed)
 Deteriorated.  BP is quite elevated today but pt is asymptomatic.  He reports he has not been keeping up w/ his usual diet and exercise regimen.  He plans to get back on track now that he has returned from [place].  Will add Amlodipine 5mg  daily in hopes of lowering BP back to normal range.  Continue Lisinopril, Metoprolol, Dilt.  Check labs due to ACE use.  He will return in 4 weeks to recheck.  Pt expressed understanding and is in agreement w/ plan.

## 2023-04-12 ENCOUNTER — Encounter: Payer: Self-pay | Admitting: Family Medicine

## 2023-04-12 ENCOUNTER — Telehealth: Payer: Self-pay

## 2023-04-12 LAB — HEPATITIS C ANTIBODY: Hepatitis C Ab: NONREACTIVE

## 2023-04-12 NOTE — Telephone Encounter (Signed)
 Pt has reviewed labs via MyChart

## 2023-04-12 NOTE — Telephone Encounter (Signed)
-----   Message from Neena Rhymes sent at 04/12/2023 12:12 PM EDT ----- Labs are stable and look good!  No changes at this time

## 2023-04-13 ENCOUNTER — Ambulatory Visit: Payer: Self-pay

## 2023-04-13 NOTE — Telephone Encounter (Signed)
 Pt reports today reading 135/80 Yesterday 04/12/2023 136/74   FYI

## 2023-04-13 NOTE — Telephone Encounter (Signed)
**Note De-identified  [place] Obfuscation** Please advise 

## 2023-04-13 NOTE — Telephone Encounter (Signed)
 Ok to stop Amlodipine but he needs to monitor his BP regularly.  If consistently higher than 140/90, he needs to let me know so we can try something different

## 2023-04-13 NOTE — Telephone Encounter (Signed)
  Chief Complaint: anxiousness, insomnia Symptoms: mild anxiety-per pt, insomnia Frequency: since starting amlodipine 4 days ago Pertinent Negatives: Patient denies CP, SOB, difficulty breathing, sweating, N/V Disposition: [] ED /[] Urgent Care (no appt availability in office) / [] Appointment(In office/virtual)/ []  [facility]/ [] Home Care/ [] Refused Recommended Disposition /[] [place] [facility]/ [x]  Follow-up with PCP Additional Notes: Pt states today was his 4th dose of amlodipine, since starting the medication pt states that he has been experiencing mild anxiousness and insomnia. Pt states that at this time he is refusing to take anymore of this medication. Pt states that he would like to know if PCP wants him on something else, please call back and advise. Copied from CRM [id number]. Topic: Clinical - Prescription Issue >> Apr 13, 2023  1:30 PM [name] wrote: Reason for CRM: Patient is calling in because the medication he is taking gives him anxiety and makes him nervous consistently.amLODipine (NORVASC) 5 MG tablet  Patient is wondering if someone could give him a call Reason for Disposition  Caller has medicine question, adult has minor symptoms, caller declines triage, AND triager answers question  Answer Assessment - Initial Assessment Questions 1. NAME of MEDICINE: "What medicine(s) are you calling about?"     amlodipine 2. QUESTION: "What is your question?" (e.g., double dose of medicine, side effect)     When I take the medication it makes me feel anxious and I have insomnia 3. PRESCRIBER: "Who prescribed the medicine?" Reason: if prescribed by specialist, call should be referred to that group.     PCP 4. SYMPTOMS: "Do you have any symptoms?" If Yes, ask: "What symptoms are you having?"  "How bad are the symptoms (e.g., mild, moderate, severe)     Mild, but anxious  Protocols used: Medication Question Call-A-AH

## 2023-04-18 ENCOUNTER — Ambulatory Visit: Payer: Medicare Other | Admitting: Family Medicine

## 2023-04-24 ENCOUNTER — Other Ambulatory Visit: Payer: Self-pay | Admitting: Family Medicine

## 2023-05-11 ENCOUNTER — Encounter (HOSPITAL_COMMUNITY): Payer: Self-pay

## 2023-05-18 ENCOUNTER — Ambulatory Visit (INDEPENDENT_AMBULATORY_CARE_PROVIDER_SITE_OTHER): Admitting: Family Medicine

## 2023-05-18 ENCOUNTER — Encounter: Payer: Self-pay | Admitting: Family Medicine

## 2023-05-18 VITALS — BP 182/92 | HR 80 | Temp 98.0°F | Ht 65.0 in | Wt 180.4 lb

## 2023-05-18 DIAGNOSIS — I10 Essential (primary) hypertension: Secondary | ICD-10-CM

## 2023-05-18 MED ORDER — TADALAFIL 5 MG PO TABS: 5.0000 mg | ORAL_TABLET | Freq: Every day | ORAL | 1 refills | Status: DC

## 2023-05-18 NOTE — Assessment & Plan Note (Signed)
 BP better than last visit but still not at goal.  He reports addition of Amlodipine  caused anxiety.  Stopped after 3 days.  Home BP's have been nearly all within normal limits.  He admits that previous experiences at doctor's offices have left him w/ PTSD and BP is hard to control in office.  He is asymptomatic.  Will continue to have him monitor BP at home and if consistently higher than 145/90, he is to let me know.  Pt expressed understanding and is in agreement w/ plan.

## 2023-05-18 NOTE — Progress Notes (Signed)
   Subjective:    Patient ID: [name], male    DOB: 1956/01/01, 68 y.o.   MRN: [id number]  HPI HTN- at last visit BP was elevated at 194/98.  We started Amlodipine  for elevated BP but pt stopped this after 3 days as this worsened anxiety.  Home BP's have been 109/62- 158/84.  Most readings in the 130s/60s.  Currently on Lisinopril  40mg  daily, Metoprolol  XL 150mg  daily, Dilt 360mg  daily.  Pt has white coat/PTSD from previous office/hospital interactions when he had prostate cancer.  No CP, SOB, HA's, visual changes, edema.     Review of Systems For ROS see HPI     Objective:   Physical Exam Vitals reviewed.  Constitutional:      General: He is not in acute distress.    Appearance: Normal appearance. He is well-developed. He is not ill-appearing.  HENT:     Head: Normocephalic and atraumatic.  Eyes:     Extraocular Movements: Extraocular movements intact.     Conjunctiva/sclera: Conjunctivae normal.     Pupils: Pupils are equal, round, and reactive to light.  Neck:     Thyroid : No thyromegaly.  Cardiovascular:     Rate and Rhythm: Normal rate and regular rhythm.     Pulses: Normal pulses.     Heart sounds: Normal heart sounds. No murmur heard. Pulmonary:     Effort: Pulmonary effort is normal. No respiratory distress.     Breath sounds: Normal breath sounds.  Abdominal:     General: Bowel sounds are normal. There is no distension.     Palpations: Abdomen is soft.  Musculoskeletal:     Cervical back: Normal range of motion and neck supple.     Right lower leg: No edema.     Left lower leg: No edema.  Lymphadenopathy:     Cervical: No cervical adenopathy.  Skin:    General: Skin is warm and dry.  Neurological:     General: No focal deficit present.     Mental Status: He is alert and oriented to person, place, and time.     Cranial Nerves: No cranial nerve deficit.  Psychiatric:        Mood and Affect: Mood normal.        Behavior: Behavior normal.            Assessment & Plan:

## 2023-05-18 NOTE — Patient Instructions (Signed)
 Follow up in 3 months to recheck to blood pressure No med changes at this time Continue to monitor BP and if consistently higher than 145/90- let me know Call with any questions or concerns Stay Safe!  Stay Healthy! Have a great summer!!!

## 2023-05-21 ENCOUNTER — Other Ambulatory Visit: Payer: Self-pay | Admitting: Family Medicine

## 2023-07-23 ENCOUNTER — Other Ambulatory Visit: Payer: Self-pay | Admitting: Family Medicine

## 2023-09-17 ENCOUNTER — Other Ambulatory Visit: Payer: Self-pay | Admitting: Family Medicine

## 2023-10-10 ENCOUNTER — Encounter: Payer: Self-pay | Admitting: Family Medicine

## 2023-10-10 ENCOUNTER — Ambulatory Visit: Admitting: Family Medicine

## 2023-10-10 VITALS — BP 132/70 | HR 89 | Temp 98.7°F | Ht 65.0 in | Wt 183.0 lb

## 2023-10-10 DIAGNOSIS — Z23 Encounter for immunization: Secondary | ICD-10-CM | POA: Diagnosis not present

## 2023-10-10 DIAGNOSIS — E785 Hyperlipidemia, unspecified: Secondary | ICD-10-CM

## 2023-10-10 DIAGNOSIS — I10 Essential (primary) hypertension: Secondary | ICD-10-CM | POA: Diagnosis not present

## 2023-10-10 DIAGNOSIS — E669 Obesity, unspecified: Secondary | ICD-10-CM

## 2023-10-10 LAB — CBC WITH DIFFERENTIAL/PLATELET
Basophils Absolute: 0 K/uL (ref 0.0–0.1)
Basophils Relative: 0.4 % (ref 0.0–3.0)
Eosinophils Absolute: 0 K/uL (ref 0.0–0.7)
Eosinophils Relative: 0.4 % (ref 0.0–5.0)
HCT: 42.7 % (ref 39.0–52.0)
Hemoglobin: 14.7 g/dL (ref 13.0–17.0)
Lymphocytes Relative: 12.7 % (ref 12.0–46.0)
Lymphs Abs: 1 K/uL (ref 0.7–4.0)
MCHC: 34.5 g/dL (ref 30.0–36.0)
MCV: 96 fl (ref 78.0–100.0)
Monocytes Absolute: 0.4 K/uL (ref 0.1–1.0)
Monocytes Relative: 5.7 % (ref 3.0–12.0)
Neutro Abs: 6.3 K/uL (ref 1.4–7.7)
Neutrophils Relative %: 80.8 % — ABNORMAL HIGH (ref 43.0–77.0)
Platelets: 275 K/uL (ref 150.0–400.0)
RBC: 4.45 Mil/uL (ref 4.22–5.81)
RDW: 13.4 % (ref 11.5–15.5)
WBC: 7.9 K/uL (ref 4.0–10.5)

## 2023-10-10 LAB — HEPATIC FUNCTION PANEL
ALT: 31 U/L (ref 0–53)
AST: 31 U/L (ref 0–37)
Albumin: 5 g/dL (ref 3.5–5.2)
Alkaline Phosphatase: 56 U/L (ref 39–117)
Bilirubin, Direct: 0.2 mg/dL (ref 0.0–0.3)
Total Bilirubin: 0.8 mg/dL (ref 0.2–1.2)
Total Protein: 8.5 g/dL — ABNORMAL HIGH (ref 6.0–8.3)

## 2023-10-10 LAB — LIPID PANEL
Cholesterol: 160 mg/dL (ref 0–200)
HDL: 63.1 mg/dL (ref >39.00)
LDL Cholesterol: 77 mg/dL (ref 0–99)
NonHDL: 96.87
Total CHOL/HDL Ratio: 3
Triglycerides: 100 mg/dL (ref 0.0–149.0)
VLDL: 20 mg/dL (ref 0.0–40.0)

## 2023-10-10 LAB — BASIC METABOLIC PANEL WITH GFR
BUN: 16 mg/dL (ref 6–23)
CO2: 25 meq/L (ref 19–32)
Calcium: 9.9 mg/dL (ref 8.4–10.5)
Chloride: 101 meq/L (ref 96–112)
Creatinine, Ser: 0.91 mg/dL (ref 0.40–1.50)
GFR: 85.61 mL/min (ref >60.00)
Glucose, Bld: 83 mg/dL (ref 70–99)
Potassium: 4.2 meq/L (ref 3.5–5.1)
Sodium: 141 meq/L (ref 135–145)

## 2023-10-10 LAB — TSH: TSH: 1.17 u[IU]/mL (ref 0.35–5.50)

## 2023-10-10 MED ORDER — TADALAFIL 5 MG PO TABS: 5.0000 mg | ORAL_TABLET | Freq: Every day | ORAL | 3 refills | Status: AC

## 2023-10-10 MED ORDER — ATORVASTATIN CALCIUM 40 MG PO TABS: 40.0000 mg | ORAL_TABLET | Freq: Every day | ORAL | 3 refills | Status: AC

## 2023-10-10 MED ORDER — METOPROLOL SUCCINATE ER 100 MG PO TB24: 100.0000 mg | ORAL_TABLET | Freq: Every day | ORAL | 3 refills | Status: AC

## 2023-10-10 MED ORDER — DILTIAZEM HCL ER 360 MG PO TB24: 360.0000 mg | ORAL_TABLET | Freq: Every day | ORAL | 3 refills | Status: AC

## 2023-10-10 MED ORDER — LISINOPRIL 40 MG PO TABS: 40.0000 mg | ORAL_TABLET | Freq: Every day | ORAL | 3 refills | Status: AC

## 2023-10-10 NOTE — Assessment & Plan Note (Signed)
 Chronic problem.  On Metoprolol  XL, Lisinopril , and Dilt daily w/ good control.  Currently asymptomatic.  Check labs due to ACE use but no anticipated med changes.  Will follow.

## 2023-10-10 NOTE — Progress Notes (Signed)
   Subjective:    Patient ID: [name], male    DOB: Jan 24, 1955, 68 y.o.   MRN: [id number]  HPI HTN- chronic problem, on Metoprolol  XL 100mg  daily, Lisinopril  40mg  daily, and Diltiazem  360mg  daily w/ good control.  Home BP's running 112-145/52-78.  No CP, SOB, HA's, visual changes, edema.  Hyperlipidemia- chronic problem, on Lipitor 40mg  daily.  No abd pain, N/V.  Obesity- weight and BMI are stable.  Pt continues to exercise regularly.     Review of Systems For ROS see HPI     Objective:   Physical Exam Vitals reviewed.  Constitutional:      General: He is not in acute distress.    Appearance: Normal appearance. He is well-developed. He is not ill-appearing.  HENT:     Head: Normocephalic and atraumatic.  Eyes:     Extraocular Movements: Extraocular movements intact.     Conjunctiva/sclera: Conjunctivae normal.     Pupils: Pupils are equal, round, and reactive to light.  Neck:     Thyroid : No thyromegaly.  Cardiovascular:     Rate and Rhythm: Normal rate and regular rhythm.     Pulses: Normal pulses.     Heart sounds: Normal heart sounds. No murmur heard. Pulmonary:     Effort: Pulmonary effort is normal. No respiratory distress.     Breath sounds: Normal breath sounds.  Abdominal:     General: Bowel sounds are normal. There is no distension.     Palpations: Abdomen is soft.  Musculoskeletal:     Cervical back: Normal range of motion and neck supple.     Right lower leg: No edema.     Left lower leg: No edema.  Lymphadenopathy:     Cervical: No cervical adenopathy.  Skin:    General: Skin is warm and dry.  Neurological:     General: No focal deficit present.     Mental Status: He is alert and oriented to person, place, and time.     Cranial Nerves: No cranial nerve deficit.  Psychiatric:        Mood and Affect: Mood normal.        Behavior: Behavior normal.           Assessment & Plan:

## 2023-10-10 NOTE — Assessment & Plan Note (Signed)
 Chronic problem.  On Lipitor 40mg  daily w/o difficulty.  Check labs.  Adjust meds prn  ?

## 2023-10-10 NOTE — Assessment & Plan Note (Signed)
 Ongoing issue.  BMI and weight are stable.  He is exercising regularly but not following particular diet.  Encouraged low carb diet in addition to his exercise.  Will follow.

## 2023-10-10 NOTE — Patient Instructions (Signed)
Follow up in 6 months to recheck BP and cholesterol We'll notify you of your lab results and make any changes if needed Keep up the good work on healthy diet and regular exercise- you look great!! Call with any questions or concerns Stay Safe!  Stay Healthy! Happy Fall!!!

## 2023-10-11 ENCOUNTER — Ambulatory Visit: Payer: Self-pay | Admitting: Family Medicine

## 2023-10-25 NOTE — Addendum Note (Signed)
 Addended by: SHARA BASCOM RAMAN on: 10/25/2023 11:59 AM   Modules accepted: Orders

## 2023-12-07 ENCOUNTER — Other Ambulatory Visit: Payer: Self-pay | Admitting: Family Medicine

## 2024-04-09 ENCOUNTER — Ambulatory Visit: Admitting: Family Medicine
